# Patient Record
Sex: Female | Born: 1985 | Race: Black or African American | Hispanic: No | Marital: Married | Smoking: Never smoker
Health system: Southern US, Community
[De-identification: ages and names within clinical notes are randomized; demographics above are authoritative.]

## PROBLEM LIST (undated history)

## (undated) ENCOUNTER — Inpatient Hospital Stay (HOSPITAL_COMMUNITY): Payer: Self-pay

## (undated) DIAGNOSIS — E119 Type 2 diabetes mellitus without complications: Secondary | ICD-10-CM

## (undated) DIAGNOSIS — I10 Essential (primary) hypertension: Secondary | ICD-10-CM

## (undated) DIAGNOSIS — E669 Obesity, unspecified: Secondary | ICD-10-CM

## (undated) DIAGNOSIS — Z8619 Personal history of other infectious and parasitic diseases: Secondary | ICD-10-CM

## (undated) DIAGNOSIS — R51 Headache: Secondary | ICD-10-CM

## (undated) DIAGNOSIS — I639 Cerebral infarction, unspecified: Secondary | ICD-10-CM

## (undated) DIAGNOSIS — G459 Transient cerebral ischemic attack, unspecified: Secondary | ICD-10-CM

## (undated) DIAGNOSIS — R011 Cardiac murmur, unspecified: Secondary | ICD-10-CM

## (undated) DIAGNOSIS — R7303 Prediabetes: Secondary | ICD-10-CM

## (undated) DIAGNOSIS — R519 Headache, unspecified: Secondary | ICD-10-CM

## (undated) DIAGNOSIS — I38 Endocarditis, valve unspecified: Secondary | ICD-10-CM

## (undated) HISTORY — DX: Prediabetes: R73.03

## (undated) HISTORY — DX: Type 2 diabetes mellitus without complications: E11.9

## (undated) HISTORY — DX: Cerebral infarction, unspecified: I63.9

## (undated) HISTORY — DX: Obesity, unspecified: E66.9

## (undated) HISTORY — DX: Personal history of other infectious and parasitic diseases: Z86.19

## (undated) HISTORY — DX: Transient cerebral ischemic attack, unspecified: G45.9

---
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
## 2003-08-18 ENCOUNTER — Emergency Department (HOSPITAL_COMMUNITY): Admission: AD | Admit: 2003-08-18 | Discharge: 2003-08-18 | Payer: Self-pay | Admitting: Family Medicine

## 2007-06-05 ENCOUNTER — Other Ambulatory Visit: Admission: RE | Admit: 2007-06-05 | Discharge: 2007-06-05 | Payer: Self-pay | Admitting: Gynecology

## 2007-06-10 ENCOUNTER — Emergency Department (HOSPITAL_COMMUNITY): Admission: EM | Admit: 2007-06-10 | Discharge: 2007-06-10 | Payer: Self-pay | Admitting: Emergency Medicine

## 2008-02-10 ENCOUNTER — Emergency Department (HOSPITAL_COMMUNITY): Admission: EM | Admit: 2008-02-10 | Discharge: 2008-02-10 | Payer: Self-pay | Admitting: Emergency Medicine

## 2009-01-20 ENCOUNTER — Emergency Department (HOSPITAL_COMMUNITY): Admission: EM | Admit: 2009-01-20 | Discharge: 2009-01-20 | Payer: Self-pay | Admitting: Family Medicine

## 2009-02-07 ENCOUNTER — Emergency Department (HOSPITAL_COMMUNITY): Admission: EM | Admit: 2009-02-07 | Discharge: 2009-02-08 | Payer: Self-pay | Admitting: Emergency Medicine

## 2009-02-12 ENCOUNTER — Ambulatory Visit: Payer: Self-pay | Admitting: Family Medicine

## 2009-02-12 DIAGNOSIS — R030 Elevated blood-pressure reading, without diagnosis of hypertension: Secondary | ICD-10-CM | POA: Insufficient documentation

## 2009-02-22 ENCOUNTER — Emergency Department (HOSPITAL_COMMUNITY): Admission: EM | Admit: 2009-02-22 | Discharge: 2009-02-22 | Payer: Self-pay | Admitting: Emergency Medicine

## 2009-02-23 LAB — CONVERTED CEMR LAB
ALT: 19 units/L (ref 0–35)
AST: 18 units/L (ref 0–37)
Albumin: 3.5 g/dL (ref 3.5–5.2)
Alkaline Phosphatase: 62 units/L (ref 39–117)
BUN: 7 mg/dL (ref 6–23)
Basophils Absolute: 0.1 10*3/uL (ref 0.0–0.1)
Basophils Relative: 1 % (ref 0.0–3.0)
Bilirubin, Direct: 0 mg/dL (ref 0.0–0.3)
CO2: 27 meq/L (ref 19–32)
Calcium: 8.8 mg/dL (ref 8.4–10.5)
Chloride: 108 meq/L (ref 96–112)
Cholesterol: 161 mg/dL (ref 0–200)
Creatinine, Ser: 0.8 mg/dL (ref 0.4–1.2)
Eosinophils Absolute: 0.1 10*3/uL (ref 0.0–0.7)
Eosinophils Relative: 1.4 % (ref 0.0–5.0)
GFR calc non Af Amer: 112.35 mL/min (ref >60)
Glucose, Bld: 85 mg/dL (ref 70–99)
HCT: 34.8 % — ABNORMAL LOW (ref 36.0–46.0)
HDL: 42.8 mg/dL (ref >39.00)
Hemoglobin: 11.5 g/dL — ABNORMAL LOW (ref 12.0–15.0)
LDL Cholesterol: 104 mg/dL — ABNORMAL HIGH (ref 0–99)
Lymphocytes Relative: 34 % (ref 12.0–46.0)
Lymphs Abs: 2.2 10*3/uL (ref 0.7–4.0)
MCHC: 33.1 g/dL (ref 30.0–36.0)
MCV: 78.8 fL (ref 78.0–100.0)
Monocytes Absolute: 0.6 10*3/uL (ref 0.1–1.0)
Monocytes Relative: 8.9 % (ref 3.0–12.0)
Neutro Abs: 3.4 10*3/uL (ref 1.4–7.7)
Neutrophils Relative %: 54.7 % (ref 43.0–77.0)
Platelets: 339 10*3/uL (ref 150.0–400.0)
Potassium: 4 meq/L (ref 3.5–5.1)
RBC: 4.41 M/uL (ref 3.87–5.11)
RDW: 14.4 % (ref 11.5–14.6)
Sodium: 138 meq/L (ref 135–145)
TSH: 2.95 microintl units/mL (ref 0.35–5.50)
Total Bilirubin: 0.7 mg/dL (ref 0.3–1.2)
Total CHOL/HDL Ratio: 4
Total Protein: 8 g/dL (ref 6.0–8.3)
Triglycerides: 72 mg/dL (ref 0.0–149.0)
VLDL: 14.4 mg/dL (ref 0.0–40.0)
WBC: 6.4 10*3/uL (ref 4.5–10.5)

## 2009-02-24 ENCOUNTER — Telehealth: Payer: Self-pay | Admitting: Family Medicine

## 2009-02-24 ENCOUNTER — Ambulatory Visit: Payer: Self-pay | Admitting: Family Medicine

## 2009-02-24 ENCOUNTER — Encounter (INDEPENDENT_AMBULATORY_CARE_PROVIDER_SITE_OTHER): Payer: Self-pay | Admitting: *Deleted

## 2009-02-24 DIAGNOSIS — R0602 Shortness of breath: Secondary | ICD-10-CM | POA: Insufficient documentation

## 2009-02-24 DIAGNOSIS — I10 Essential (primary) hypertension: Secondary | ICD-10-CM | POA: Insufficient documentation

## 2009-02-24 DIAGNOSIS — R079 Chest pain, unspecified: Secondary | ICD-10-CM | POA: Insufficient documentation

## 2009-02-24 LAB — CONVERTED CEMR LAB
BUN: 8 mg/dL (ref 6–23)
CO2: 31 meq/L (ref 19–32)
Calcium: 8.9 mg/dL (ref 8.4–10.5)
Chloride: 108 meq/L (ref 96–112)
Creatinine, Ser: 0.6 mg/dL (ref 0.4–1.2)
GFR calc non Af Amer: 156.54 mL/min (ref >60)
Glucose, Bld: 91 mg/dL (ref 70–99)
Potassium: 3.6 meq/L (ref 3.5–5.1)
Sodium: 140 meq/L (ref 135–145)

## 2009-02-25 ENCOUNTER — Telehealth (INDEPENDENT_AMBULATORY_CARE_PROVIDER_SITE_OTHER): Payer: Self-pay | Admitting: *Deleted

## 2009-03-31 ENCOUNTER — Encounter (HOSPITAL_COMMUNITY): Admission: RE | Admit: 2009-03-31 | Discharge: 2009-05-27 | Payer: Self-pay | Admitting: Cardiology

## 2009-04-08 ENCOUNTER — Inpatient Hospital Stay (HOSPITAL_BASED_OUTPATIENT_CLINIC_OR_DEPARTMENT_OTHER): Admission: RE | Admit: 2009-04-08 | Discharge: 2009-04-08 | Payer: Self-pay | Admitting: Cardiology

## 2009-04-22 ENCOUNTER — Encounter: Payer: Self-pay | Admitting: Family Medicine

## 2009-11-21 ENCOUNTER — Emergency Department (HOSPITAL_COMMUNITY): Admission: EM | Admit: 2009-11-21 | Discharge: 2009-11-21 | Payer: Self-pay | Admitting: Emergency Medicine

## 2011-01-03 LAB — POCT I-STAT, CHEM 8
BUN: 5 mg/dL — ABNORMAL LOW (ref 6–23)
BUN: 8 mg/dL (ref 6–23)
Calcium, Ion: 1.16 mmol/L (ref 1.12–1.32)
Calcium, Ion: 1.13 mmol/L (ref 1.12–1.32)
Chloride: 106 mEq/L (ref 96–112)
Chloride: 99 mEq/L (ref 96–112)
Creatinine, Ser: 0.9 mg/dL (ref 0.4–1.2)
Creatinine, Ser: 1 mg/dL (ref 0.4–1.2)
Glucose, Bld: 91 mg/dL (ref 70–99)
Glucose, Bld: 103 mg/dL — ABNORMAL HIGH (ref 70–99)
HCT: 36 % (ref 36.0–46.0)
HCT: 35 % — ABNORMAL LOW (ref 36.0–46.0)
Hemoglobin: 12.2 g/dL (ref 12.0–15.0)
Hemoglobin: 11.9 g/dL — ABNORMAL LOW (ref 12.0–15.0)
Potassium: 3.4 mEq/L — ABNORMAL LOW (ref 3.5–5.1)
Potassium: 3 mEq/L — ABNORMAL LOW (ref 3.5–5.1)
Sodium: 140 mEq/L (ref 135–145)
Sodium: 137 mEq/L (ref 135–145)
TCO2: 23 mmol/L (ref 0–100)
TCO2: 29 mmol/L (ref 0–100)

## 2011-01-03 LAB — URINALYSIS, ROUTINE W REFLEX MICROSCOPIC
Bilirubin Urine: NEGATIVE
Glucose, UA: NEGATIVE mg/dL
Glucose, UA: NEGATIVE mg/dL
Hgb urine dipstick: NEGATIVE
Hgb urine dipstick: NEGATIVE
Ketones, ur: 15 mg/dL — AB
Ketones, ur: NEGATIVE mg/dL
Nitrite: NEGATIVE
Nitrite: NEGATIVE
Protein, ur: NEGATIVE mg/dL
Protein, ur: NEGATIVE mg/dL
Specific Gravity, Urine: 1.037 — ABNORMAL HIGH (ref 1.005–1.030)
Specific Gravity, Urine: 1.021 (ref 1.005–1.030)
Urobilinogen, UA: 1 mg/dL (ref 0.0–1.0)
Urobilinogen, UA: 0.2 mg/dL (ref 0.0–1.0)
pH: 6 (ref 5.0–8.0)
pH: 6.5 (ref 5.0–8.0)

## 2011-01-03 LAB — URINE MICROSCOPIC-ADD ON

## 2011-01-03 LAB — RAPID URINE DRUG SCREEN, HOSP PERFORMED
Amphetamines: NOT DETECTED
Barbiturates: NOT DETECTED
Benzodiazepines: NOT DETECTED
Cocaine: NOT DETECTED
Opiates: NOT DETECTED
Tetrahydrocannabinol: NOT DETECTED

## 2011-01-03 LAB — D-DIMER, QUANTITATIVE: D-Dimer, Quant: <0.22 ug/mL-FEU (ref 0.00–0.48)

## 2011-02-07 NOTE — Assessment & Plan Note (Signed)
[facility]                                 ON-CALL NOTE   Autumn [name], Autumn [name]                       MRN:          [id number]  DATE:02/22/2009                            DOB:          02-Mar-1986    PRIMARY CARE Nisa Decaire:  Dr. Loreen Freud.   I received a page from the patient and spoke to her sister.  The patient  is actually in the [facility] Emergency Room right now.  She was  currently getting a chest x-ray and being worked up for chest pain and  acute shortness of breath.  They had wanted to call and let her primary  care doctor know and also to see if I could be assistance in the  emergency room.  I discussed that typically acute cases in the emergency  room are evaluated by the emergency room physicians.  I encouraged them  to emphasize their worry to the ER attending.  Also, of note, I do not  have active privileges with the hospital system, so I would be unable to  evaluate the patient in the emergency room regardless.  Sister seemed to  understand this and had no further questions.     Juleen China, MD    STC/MedQ  DD: 02/22/2009  DT: 02/22/2009  Job #: [id number]

## 2011-02-07 NOTE — Cardiovascular Report (Signed)
NAME:  ARYELLE, FIGG              ACCOUNT NO.:  [id number]   MEDICAL RECORD NO.:  [id number]          PATIENT TYPE:  OIB   LOCATION:  1961                         FACILITY:  [facility]   PHYSICIAN:  Mohan N. Sharyn Lull, M.D. DATE OF BIRTH:  Apr 15, 1986   DATE OF PROCEDURE:  04/08/2009  DATE OF DISCHARGE:  04/08/2009                            CARDIAC CATHETERIZATION   PROCEDURES:  Left cardiac catheterization with selective left and right  coronary angiography, left ventriculography via right groin using  Judkins technique.   INDICATIONS FOR PROCEDURE:  Ms. Arnell is a 25 year old black female  with past medical history significant for hypertension, morbid obesity,  positive family history of coronary artery disease, degenerative joint  disease, chronic headache, complains of retrosternal chest pain,  described as tightness and sharp, occasionally radiating to the left arm  associated with numbness in the left arm and palpitation and dizziness  lasting few minutes.  The patient denies any syncopal episode, also  complains of exertional dyspnea.  Denies PND, orthopnea, or leg  swelling.  The patient underwent adenosine Myoview on July 8, which  showed large area of anterolateral wall ischemia with EF of 60%.  Denies  any relation of chest pain to food, breathing, or movement.  Denies any  history of neck trauma or motor vehicle accident.   PAST MEDICAL HISTORY:  As above.   PAST SURGICAL HISTORY:  None.   ALLERGIES:  None.   MEDICATIONS AT HOME:  She is on:  1. Toprol-XL 25 mg p.o. daily.  2. Enteric-coated aspirin 81 mg p.o. daily.  3. Nitrostat 0.4 mg sublingual daily.  4. Diovan HCT 320/12.5 daily.   SOCIAL HISTORY:  She is single.  No children.  No history of smoking or  alcohol abuse.  Drinks wine occasionally.  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   FAMILY HISTORY:  Father is alive.  He has coronary artery disease.  Mother is diabetic and hypertensive.  One aunt  had MI at the age of 36.  She has 4 sisters in good health.   PHYSICAL EXAMINATION:  GENERAL:  She is alert, awake, and oriented x3,  in no acute distress.  VITAL SIGNS:  Blood pressure was 150/84, pulse was 84 and regular.  HEENT:  Conjunctivae pink.  NECK:  Supple.  No JVD.  No bruit.  LUNGS:  Clear to auscultation without rhonchi or rales.  CARDIOVASCULAR:  S1 and S2 normal.  There was soft systolic murmur.  ABDOMEN:  Soft.  Bowel sounds present.  Obese and nontender.  EXTREMITIES:  There was no clubbing, cyanosis, or edema.   IMPRESSION:  1. Recurrent chest pain.  2. Left arm numbness.  3. Positive adenosine Myoview, rule out coronary insufficiency.  4. Hypertension.  5. Morbid obesity.  6. Positive family history of coronary artery disease.  7. History of chronic headaches.   I discussed with the patient at length regarding adenosine Myoview  result and left cath, its risks and benefits, i.e., death, MI, stroke,  need for emergency CABG, local vascular complications, etc., and  consented for the procedure.   PROCEDURE:  After obtaining informed consent, the patient was brought to  the cath lab and was placed on fluoroscopy table.  Right groin was  prepped and draped in usual fashion.  Xylocaine 1% was used for local  anesthesia.  With the help of thin-wall needle, 4-French arterial sheath  was placed.  The sheath was aspirated and flushed.  Next, a 4-French  left Judkins catheter was advanced over the wire under fluoroscopic  guidance up to the ascending aorta.  Wire was pulled out.  The catheter  was aspirated and connected to the manifold.  Catheter was further  advanced and engaged into the left coronary ostium.  Multiple views of  the left system were taken.  Next, the catheter was disengaged and was  pulled out over the wire and was replaced with 4-French 3D right  diagnostic catheter, which was advanced over the wire under fluoroscopic  guidance up to the ascending  aorta.  Wire was pulled out.  The catheter  was aspirated and connected to the manifold.  Catheter was further  advanced and engaged into the right coronary ostium.  Multiple views of  the right system were taken.  Next, catheter was disengaged and was  pulled out over the wire and was replaced with 4-French pigtail  catheter, which was advanced over the wire under fluoroscopic guidance  into the ascending aorta.  Wire was pulled out.  The catheter was  aspirated and connected to the manifold.  Catheter was further advanced  across the aortic valve into the LV.  LV pressures were recorded.  Next,  LV graphy was done in 30-degree RAO position.  Postangiographic  pressures were recorded from LV and then pullback pressures were  recorded from the aorta.  There was no significant gradient across the  aortic valve.  Next, a pigtail catheter was pulled out over the wire.  Sheaths were aspirated and flushed.   FINDINGS:  LV showed good LV systolic function, EF of approximately 60%.  Left main was patent.  LAD was patent.  Diagonal one was small, which  was patent.  Diagonal 2 was very small, which was patent.  The left  circumflex has 10% proximal stenosis and it tapers down in AV groove  after large OM2.  OM1 was very very small.  OM2 was large, which was  patent.  RCA has 10-15% mid stenosis.  PDA and PLV branches were very  small, which were patent.  The patient tolerated the procedure well.  There were no complications.  The patient was transferred to recovery  room in stable condition.      Eduardo Osier. Sharyn Lull, M.D.  Electronically Signed     MNH/MEDQ  D:  04/08/2009  T:  04/08/2009  Job:  [id number]   cc:   Cath Lab

## 2011-09-06 ENCOUNTER — Encounter: Payer: Self-pay | Admitting: *Deleted

## 2011-09-06 ENCOUNTER — Emergency Department (HOSPITAL_COMMUNITY)
Admission: EM | Admit: 2011-09-06 | Discharge: 2011-09-06 | Disposition: A | Discharge status: Home / Self Care | Payer: Self-pay | Attending: Emergency Medicine | Admitting: Emergency Medicine

## 2011-09-06 ENCOUNTER — Emergency Department (HOSPITAL_COMMUNITY): Payer: Self-pay

## 2011-09-06 ENCOUNTER — Other Ambulatory Visit: Payer: Self-pay

## 2011-09-06 DIAGNOSIS — IMO0001 Reserved for inherently not codable concepts without codable children: Secondary | ICD-10-CM | POA: Insufficient documentation

## 2011-09-06 DIAGNOSIS — R509 Fever, unspecified: Secondary | ICD-10-CM | POA: Insufficient documentation

## 2011-09-06 DIAGNOSIS — R05 Cough: Secondary | ICD-10-CM | POA: Insufficient documentation

## 2011-09-06 DIAGNOSIS — R059 Cough, unspecified: Secondary | ICD-10-CM | POA: Insufficient documentation

## 2011-09-06 DIAGNOSIS — R079 Chest pain, unspecified: Secondary | ICD-10-CM | POA: Insufficient documentation

## 2011-09-06 DIAGNOSIS — J3489 Other specified disorders of nose and nasal sinuses: Secondary | ICD-10-CM | POA: Insufficient documentation

## 2011-09-06 DIAGNOSIS — J111 Influenza due to unidentified influenza virus with other respiratory manifestations: Secondary | ICD-10-CM | POA: Insufficient documentation

## 2011-09-06 HISTORY — DX: Endocarditis, valve unspecified: I38

## 2011-09-06 MED ORDER — BENZONATATE 200 MG PO CAPS: 200.0000 mg | ORAL_CAPSULE | Freq: Three times a day (TID) | ORAL | Status: AC | PRN

## 2011-09-06 MED ORDER — BENZONATATE 100 MG PO CAPS
200.0000 mg | ORAL_CAPSULE | Freq: Once | ORAL | Status: AC
  Administered 2011-09-06: 200 mg via ORAL
  Filled 2011-09-06 (×2): qty 1

## 2011-09-06 NOTE — ED Notes (Signed)
Pt reports 4 day hx of midsternal chest pain. Reports hx of leaking valve. Pt reports cough developed 3 days ago with chills. Denies n/v.

## 2011-09-06 NOTE — ED Provider Notes (Signed)
History     CSN: [id number] Arrival date & time: 09/06/2011  5:16 PM   First MD Initiated Contact with Patient 09/06/11 1833      Chief Complaint  Patient presents with  . Cough  . Chills  . Chest Pain    (Consider location/radiation/quality/duration/timing/severity/associated sxs/prior treatment) Patient is a 25 y.o. female presenting with cough and chest pain. The history is provided by the patient.  Cough This is a new problem. The current episode started more than 2 days ago. The problem occurs every few minutes. The cough is non-productive. The maximum temperature recorded prior to her arrival was 100 to 100.9 F. The fever has been present for 1 to 2 days. Associated symptoms include chest pain, rhinorrhea, sore throat and myalgias. Pertinent negatives include no headaches, no shortness of breath and no wheezing. Associated symptoms comments: Chest pain pain feels sore from coughing.. Treatments tried: tylenol. The treatment provided mild relief. She is not a smoker.  Chest Pain Primary symptoms include a fever and cough. Pertinent negatives for primary symptoms include no shortness of breath, no wheezing, no abdominal pain, no nausea and no dizziness.  Pertinent negatives for associated symptoms include no numbness and no weakness.     Past Medical History  Diagnosis Date  . Leaky heart valve     History reviewed. No pertinent past surgical history.  History reviewed. No pertinent family history.  History  Substance Use Topics  . Smoking status: Never Smoker   . Smokeless tobacco: Not on file  . Alcohol Use: Yes    OB History    Grav Para Term Preterm Abortions TAB SAB Ect Mult Living                  Review of Systems  Constitutional: Positive for fever.  HENT: Positive for sore throat and rhinorrhea. Negative for congestion and neck pain.   Eyes: Negative.   Respiratory: Positive for cough. Negative for chest tightness, shortness of breath and wheezing.     Cardiovascular: Positive for chest pain.  Gastrointestinal: Negative for nausea and abdominal pain.  Genitourinary: Negative.   Musculoskeletal: Positive for myalgias. Negative for joint swelling and arthralgias.  Skin: Negative.  Negative for rash and wound.  Neurological: Negative for dizziness, weakness, light-headedness, numbness and headaches.  Hematological: Negative.   Psychiatric/Behavioral: Negative.     Allergies  Review of patient's allergies indicates no known allergies.  Home Medications   Current Outpatient Rx  Name Route Sig Dispense Refill  . TYLENOL PO Oral Take 1 tablet by mouth every 6 (six) hours as needed. For pain.     Marland Kitchen BENZONATATE 200 MG PO CAPS Oral Take 1 capsule (200 mg total) by mouth 3 (three) times daily as needed for cough. 20 capsule 0    BP 139/86  Pulse 80  Temp(Src) 99.6 F (37.6 C) (Oral)  Resp 22  SpO2 97%  Physical Exam  Nursing note and vitals reviewed. Constitutional: She is oriented to person, place, and time. She appears well-developed and well-nourished.  HENT:  Head: Normocephalic and atraumatic.  Eyes: Conjunctivae are normal.  Neck: Normal range of motion.  Cardiovascular: Normal rate, regular rhythm, normal heart sounds and intact distal pulses.   Pulmonary/Chest: Effort normal and breath sounds normal. No respiratory distress. She has no wheezes. She has no rales. She exhibits tenderness.  Abdominal: Soft. Bowel sounds are normal. There is no tenderness.  Musculoskeletal: Normal range of motion.  Neurological: She is alert and oriented to person,  place, and time.  Skin: Skin is warm and dry.  Psychiatric: She has a normal mood and affect.    ED Course  Procedures (including critical care time)  Labs Reviewed - No data to display Dg Chest 2 View  09/06/2011  *RADIOLOGY REPORT*  Clinical Data: Chest pain.  Congestion and cough.  CHEST - 2 VIEW  Comparison: 02/22/2009  Findings: An there is some left ventricular  prominence. Mediastinal shadows are normal.  The vascularity is normal.  Lungs are clear.  No effusions.  No bony abnormalities.  There may be mild bronchial thickening.  IMPRESSION: Mild bronchitis is possible.  No consolidation or collapse.  Original Report Authenticated By: Thomasenia Sales, M.D.     1. Influenza       MDM  Tessalon,  Fluids,  Rest,  Tylenol or motrin.        Candis Musa, PA 09/06/11 2234

## 2011-09-06 NOTE — ED Provider Notes (Signed)
Medical screening examination/treatment/procedure(s) were performed by non-physician practitioner and as supervising physician I was immediately available for consultation/collaboration.   Dominque Marlin, MD 09/06/11 2318 

## 2011-10-19 ENCOUNTER — Encounter: Payer: Self-pay | Admitting: Gynecology

## 2011-10-19 ENCOUNTER — Ambulatory Visit (INDEPENDENT_AMBULATORY_CARE_PROVIDER_SITE_OTHER): Payer: 59

## 2011-10-19 ENCOUNTER — Ambulatory Visit (INDEPENDENT_AMBULATORY_CARE_PROVIDER_SITE_OTHER): Payer: 59 | Admitting: Gynecology

## 2011-10-19 VITALS — BP 132/90 | Ht 64.5 in | Wt 315.0 lb

## 2011-10-19 DIAGNOSIS — N912 Amenorrhea, unspecified: Secondary | ICD-10-CM

## 2011-10-19 DIAGNOSIS — O34599 Maternal care for other abnormalities of gravid uterus, unspecified trimester: Secondary | ICD-10-CM

## 2011-10-19 DIAGNOSIS — O3480 Maternal care for other abnormalities of pelvic organs, unspecified trimester: Secondary | ICD-10-CM

## 2011-10-19 LAB — US OB TRANSVAGINAL

## 2011-10-19 LAB — PREGNANCY, URINE: Preg Test, Ur: POSITIVE

## 2011-10-19 NOTE — Progress Notes (Signed)
The patient is a 26 year old gravida 0 who has not been seen the office since 2008. Patient stated that she had at home pregnancy test in December which was positive. She stated her last menstrual period was November 18. Urine pregnancy test in the office today with positive. Estimated gestational age based on last menstrual period is 9/2 weeks. Estimated date of confinement 05/17/2012. Patient has mild nausea but no vomiting reported. She states she's had normal menstrual cycles.  Pelvic exam: Somewhat limited due to patient's BMI of 53.23  Ultrasound was ordered which demonstrated a viable intrauterine pregnancy size consistent with dates. Fetal pole and cardiac activity was noted along with a normal yolk sac. A low-lying posterior placenta was noted. A right ovarian corpus luteum cyst was noted. Left R. was normal. Cervix long closed.  Patient was started on prenatal vitamins and was referred to my obstetrical colleagues for her obstetrical care. Literature information on pregnancies of drusen dose was provided as well.

## 2011-10-19 NOTE — Patient Instructions (Signed)
Pregnancy If you are planning on getting pregnant, it is a good idea to make a preconception appointment with your care- giver to discuss having a healthy lifestyle before getting pregnant. Such as, diet, weight, exercise, taking prenatal vitamins especially folic acid (it helps prevent brain and spinal cord defects), avoiding alcohol, smoking and illegal drugs, medical problems (diabetes, convulsions), family history of genetic problems, working conditions and immunizations. It is better to have knowledge of these things and do something about them before getting pregnant. In your pregnancy, it is important to follow certain guidelines to have a healthy baby. It is very important to get good prenatal care and follow your caregiver's instructions. Prenatal care includes all the medical care you receive before your baby's birth. This helps to prevent problems during the pregnancy and childbirth. HOME CARE INSTRUCTIONS   Start your prenatal visits by the 12th week of pregnancy or before when possible. They are usually scheduled monthly at first. They are more often in the last 2 months before delivery. It is important that you keep your caregiver's appointments and follow your caregiver's instructions regarding medication use, exercise, and diet.   During pregnancy, you are providing food for you and your baby. Eat a regular, well-balanced diet. Choose foods such as meat, fish, milk and other dairy products, vegetables, fruits, whole-grain breads and cereals. Your caregiver will inform you of the ideal weight gain depending on your current height and weight. Drink lots of liquids. Try to drink 8 glasses of water a day.   Alcohol is associated with a number of birth defects including fetal alcohol syndrome. It is best to avoid alcohol completely. Smoking will cause low birth rate and prematurity. Use of alcohol and nicotine during your pregnancy also increases the chances that your child will be chemically  dependent later in their life and may contribute to SIDS (Sudden Infant Death Syndrome).   Do not use illegal drugs.   Only take prescription or over-the-counter medications that are recommended by your caregiver. Other medications can cause genetic and physical problems in the baby.   Morning sickness can often be helped by keeping soda crackers at the bedside. Eat a couple before arising in the morning.   A sexual relationship may be continued until near the end of pregnancy if there are no other problems such as early (premature) leaking of amniotic fluid from the membranes, vaginal bleeding, painful intercourse or belly (abdominal) pain.   Exercise regularly. Check with your caregiver if you are unsure of the safety of some of your exercises.   Do not use hot tubs, steam rooms or saunas. These increase the risk of fainting or passing out and hurting yourself and the baby. Swimming is OK for exercise. Get plenty of rest, including afternoon naps when possible especially in the third trimester.   Avoid toxic odors and chemicals.   Do not wear high heels. They may cause you to lose your balance and fall.   Do not lift over 5 pounds. If you do lift anything, lift with your legs and thighs, not your back.   Avoid long trips, especially in the third trimester.   If you have to travel out of the city or state, take a copy of your medical records with you.  SEEK IMMEDIATE MEDICAL CARE IF:   You develop an unexplained oral temperature above 102 F (38.9 C), or as your caregiver suggests.   You have leaking of fluid from the vagina. If leaking membranes are suspected, take   your temperature and inform your caregiver of this when you call.   There is vaginal spotting or bleeding. Notify your caregiver of the amount and how many pads are used.   You continue to feel sick to your stomach (nauseous) and have no relief from remedies suggested, or you throw up (vomit) blood or coffee ground like  materials.   You develop upper abdominal pain.   You have round ligament discomfort in the lower abdominal area. This still must be evaluated by your caregiver.   You feel contractions of the uterus.   You do not feel the baby move, or there is less movement than before.   You have painful urination.   You have abnormal vaginal discharge.   You have persistent diarrhea.   You get a severe headache.   You have problems with your vision.   You develop muscle weakness.   You feel dizzy and faint.   You develop shortness of breath.   You develop chest pain.   You have back pain that travels down to your leg and feet.   You feel irregular or a very fast heartbeat.   You develop excessive weight gain in a short period of time (5 pounds in 3 to 5 days).   You are involved with a domestic violence situation.  Document Released: 09/11/2005 Document Revised: 05/24/2011 Document Reviewed: 03/05/2009 [employer] Patient Information 2012 [employer]. 

## 2011-11-10 ENCOUNTER — Encounter (HOSPITAL_COMMUNITY): Payer: Self-pay | Admitting: Emergency Medicine

## 2011-11-10 ENCOUNTER — Emergency Department (HOSPITAL_COMMUNITY)
Admission: EM | Admit: 2011-11-10 | Discharge: 2011-11-10 | Disposition: A | Discharge status: Home / Self Care | Payer: 59 | Attending: Emergency Medicine | Admitting: Emergency Medicine

## 2011-11-10 ENCOUNTER — Emergency Department (HOSPITAL_COMMUNITY): Payer: 59

## 2011-11-10 ENCOUNTER — Other Ambulatory Visit: Payer: Self-pay

## 2011-11-10 DIAGNOSIS — R079 Chest pain, unspecified: Secondary | ICD-10-CM | POA: Insufficient documentation

## 2011-11-10 DIAGNOSIS — R002 Palpitations: Secondary | ICD-10-CM | POA: Insufficient documentation

## 2011-11-10 LAB — CBC
HCT: 34.5 % — ABNORMAL LOW (ref 36.0–46.0)
Hemoglobin: 11.9 g/dL — ABNORMAL LOW (ref 12.0–15.0)
MCH: 26.7 pg (ref 26.0–34.0)
MCHC: 34.5 g/dL (ref 30.0–36.0)
MCV: 77.4 fL — ABNORMAL LOW (ref 78.0–100.0)
Platelets: 275 10*3/uL (ref 150–400)
RBC: 4.46 MIL/uL (ref 3.87–5.11)
RDW: 14.6 % (ref 11.5–15.5)
WBC: 8 10*3/uL (ref 4.0–10.5)

## 2011-11-10 LAB — DIFFERENTIAL
Basophils Absolute: 0 10*3/uL (ref 0.0–0.1)
Basophils Relative: 0 % (ref 0–1)
Eosinophils Absolute: 0.1 10*3/uL (ref 0.0–0.7)
Eosinophils Relative: 1 % (ref 0–5)
Lymphocytes Relative: 33 % (ref 12–46)
Lymphs Abs: 2.6 10*3/uL (ref 0.7–4.0)
Monocytes Absolute: 0.7 10*3/uL (ref 0.1–1.0)
Monocytes Relative: 9 % (ref 3–12)
Neutro Abs: 4.5 10*3/uL (ref 1.7–7.7)
Neutrophils Relative %: 57 % (ref 43–77)

## 2011-11-10 LAB — BASIC METABOLIC PANEL
BUN: 6 mg/dL (ref 6–23)
GFR calc non Af Amer: >90 mL/min (ref >90)
Glucose, Bld: 81 mg/dL (ref 70–99)
Potassium: 3.6 mEq/L (ref 3.5–5.1)

## 2011-11-10 NOTE — ED Notes (Signed)
Chest pain and palpitations x 3  Days  States is 3 months preg. States has a leaking problem in heart valve 1 year ago

## 2011-11-10 NOTE — ED Notes (Signed)
Pt. reports that she has felt tiered and SOB for the past 2 days.  Pt. Reports that her energy level has gone down.  Pt. reports that her right arm has been numb and was "numb on the way here to the hospital."  Pt. Still has a c/o right center chest pain that it is "tight."

## 2011-11-10 NOTE — ED Notes (Signed)
Pt back from x-ray.

## 2011-11-10 NOTE — ED Notes (Signed)
Attempted to hear FHT with doppler but unable to. Pt states she is [redacted] weeks pregnant and her OB office tried last week and told her she was still too early in pregnancy to hear FHT.

## 2011-11-10 NOTE — ED Notes (Signed)
D/C inst reviewed. Pt verbalized understanding.

## 2011-11-10 NOTE — ED Notes (Signed)
Pt placed on monitor and pulse ox 

## 2011-11-10 NOTE — ED Provider Notes (Signed)
History     CSN: [id number]  Arrival date & time 11/10/11  1007   First MD Initiated Contact with Patient 11/10/11 1110      Chief Complaint  Patient presents with  . Chest Pain    (Consider location/radiation/quality/duration/timing/severity/associated sxs/prior treatment) Patient is a 26 y.o. female presenting with chest pain. The history is provided by the patient (The patient complains of palpitations. Mild chest discomfort.). No language interpreter was used.  Chest Pain The chest pain began 3 - 5 days ago. Chest pain occurs frequently. The chest pain is unchanged. The pain is associated with stress. At its most intense, the pain is at 3/10. The pain is currently at 1/10. The quality of the pain is described as aching. The pain does not radiate. Exacerbated by: nothing. Primary symptoms include palpitations. Pertinent negatives for primary symptoms include no fever, no fatigue, no cough and no abdominal pain.  Pertinent negatives for associated symptoms include no claudication. She tried nothing for the symptoms. Risk factors include obesity.  Pertinent negatives for past medical history include no aneurysm and no seizures.     Past Medical History  Diagnosis Date  . Leaky heart valve   . Pregnancy as incidental finding     History reviewed. No pertinent past surgical history.  Family History  Problem Relation Age of Onset  . Hypertension Mother   . Diabetes Sister   . Hypertension Sister     History  Substance Use Topics  . Smoking status: Never Smoker   . Smokeless tobacco: Never Used  . Alcohol Use: Yes     OCC    OB History    Grav Para Term Preterm Abortions TAB SAB Ect Mult Living   1               Review of Systems  Constitutional: Negative for fever and fatigue.  HENT: Negative for congestion, sinus pressure and ear discharge.   Eyes: Negative for discharge.  Respiratory: Negative for cough.   Cardiovascular: Positive for chest pain and  palpitations. Negative for claudication.  Gastrointestinal: Negative for abdominal pain and diarrhea.  Genitourinary: Negative for frequency and hematuria.  Musculoskeletal: Negative for back pain.  Skin: Negative for rash.  Neurological: Negative for seizures and headaches.  Hematological: Negative.   Psychiatric/Behavioral: Negative for hallucinations.    Allergies  Review of patient's allergies indicates no known allergies.  Home Medications   Current Outpatient Rx  Name Route Sig Dispense Refill  . ACETAMINOPHEN 500 MG PO TABS Oral Take 1,000 mg by mouth every 6 (six) hours as needed. For pain    . ASPIRIN EC 81 MG PO TBEC Oral Take 81 mg by mouth daily.    Marland Kitchen PRENATAL MULTIVITAMIN CH Oral Take 1 tablet by mouth daily.      BP 140/70  Pulse 98  Temp(Src) 98 F (36.7 C) (Oral)  Resp 14  SpO2 100%  LMP 08/13/2011  Physical Exam  Constitutional: She is oriented to person, place, and time. She appears well-developed.  HENT:  Head: Normocephalic and atraumatic.  Eyes: Conjunctivae and EOM are normal. No scleral icterus.  Neck: Neck supple. No thyromegaly present.  Cardiovascular: Normal rate and regular rhythm.  Exam reveals no gallop and no friction rub.   No murmur heard. Pulmonary/Chest: No stridor. She has no wheezes. She has no rales. She exhibits no tenderness.  Abdominal: She exhibits no distension. There is no tenderness. There is no rebound.  Musculoskeletal: Normal range of motion. She exhibits  no edema.  Lymphadenopathy:    She has no cervical adenopathy.  Neurological: She is oriented to person, place, and time. Coordination normal.  Skin: No rash noted. No erythema.  Psychiatric: She has a normal mood and affect. Her behavior is normal.    ED Course  Procedures (including critical care time)  Labs Reviewed  CBC - Abnormal; Notable for the following:    Hemoglobin 11.9 (*)    HCT 34.5 (*)    MCV 77.4 (*)    All other components within normal limits    BASIC METABOLIC PANEL - Abnormal; Notable for the following:    Sodium 133 (*)    All other components within normal limits  DIFFERENTIAL  POCT I-STAT TROPONIN I   Dg Chest 1 View  11/10/2011  *RADIOLOGY REPORT*  Clinical Data: Heart palpitations, cough, mid chest pain  CHEST - 1 VIEW  Comparison: Chest x-ray of 09/06/2011  Findings: The patient is pregnant and her abdomen and pelvis were lead shielded.  Therefore, only a single view was obtained.  The lungs are clear.  The heart is within upper limits of normal.  No bony abnormality is seen.  IMPRESSION: No active lung disease.  Original Report Authenticated By: Juline Patch, M.D.     1. Palpitations     Date: 11/10/2011  Rate: 92  Rhythm: normal sinus rhythm  QRS Axis: normal  Intervals: normal  ST/T Wave abnormalities: normal  Conduction Disutrbances:none  Narrative Interpretation:   Old EKG Reviewed: none available     MDM   Palpitaions,  Unknown cause.  None seen on monitor.  Pt to follow up with cardiologist and possible get holter       Benny Lennert, MD 11/10/11 1426

## 2011-11-16 ENCOUNTER — Other Ambulatory Visit: Payer: Self-pay | Admitting: *Deleted

## 2011-11-16 NOTE — Telephone Encounter (Signed)
Pt is 12 weeks preg. And wanted to know if Dr.Jf would still be able to her as a patient. She was given names of other OB doctors but 1 place [facility] told pt about said that she had to pay money up front for her care.I explained to pt that JF no longer does OB. Pt said she was concerned because she has a heart value problem and she hasn't been feeling good. I told pt that she could always go to [facility] hospital if any problems should occur. Pt has appointment at [facility] [place] [facility] on march 7th I told pt keep this appointment. Pt was okay with all the above.

## 2011-11-20 LAB — OB RESULTS CONSOLE ABO/RH: RH Type: POSITIVE

## 2011-11-20 LAB — OB RESULTS CONSOLE HIV ANTIBODY (ROUTINE TESTING): HIV: NONREACTIVE

## 2011-11-22 ENCOUNTER — Encounter (HOSPITAL_COMMUNITY): Payer: Self-pay

## 2011-11-22 ENCOUNTER — Inpatient Hospital Stay (HOSPITAL_COMMUNITY)
Admission: AD | Admit: 2011-11-22 | Discharge: 2011-11-22 | Disposition: A | Discharge status: Home / Self Care | Payer: 59 | Source: Ambulatory Visit | Attending: Obstetrics and Gynecology | Admitting: Obstetrics and Gynecology

## 2011-11-22 ENCOUNTER — Other Ambulatory Visit: Payer: Self-pay

## 2011-11-22 DIAGNOSIS — O2690 Pregnancy related conditions, unspecified, unspecified trimester: Secondary | ICD-10-CM

## 2011-11-22 DIAGNOSIS — R002 Palpitations: Secondary | ICD-10-CM

## 2011-11-22 DIAGNOSIS — O10919 Unspecified pre-existing hypertension complicating pregnancy, unspecified trimester: Secondary | ICD-10-CM

## 2011-11-22 DIAGNOSIS — R209 Unspecified disturbances of skin sensation: Secondary | ICD-10-CM

## 2011-11-22 DIAGNOSIS — R109 Unspecified abdominal pain: Secondary | ICD-10-CM | POA: Insufficient documentation

## 2011-11-22 DIAGNOSIS — Z331 Pregnant state, incidental: Secondary | ICD-10-CM

## 2011-11-22 DIAGNOSIS — R079 Chest pain, unspecified: Secondary | ICD-10-CM | POA: Insufficient documentation

## 2011-11-22 DIAGNOSIS — O99891 Other specified diseases and conditions complicating pregnancy: Secondary | ICD-10-CM | POA: Insufficient documentation

## 2011-11-22 DIAGNOSIS — R0789 Other chest pain: Secondary | ICD-10-CM

## 2011-11-22 DIAGNOSIS — O10019 Pre-existing essential hypertension complicating pregnancy, unspecified trimester: Secondary | ICD-10-CM | POA: Insufficient documentation

## 2011-11-22 HISTORY — DX: Essential (primary) hypertension: I10

## 2011-11-22 LAB — URINE MICROSCOPIC-ADD ON

## 2011-11-22 LAB — URINALYSIS, ROUTINE W REFLEX MICROSCOPIC
Glucose, UA: NEGATIVE mg/dL
Ketones, ur: NEGATIVE mg/dL
pH: 6 (ref 5.0–8.0)

## 2011-11-22 MED ORDER — LABETALOL HCL 100 MG PO TABS: 100.0000 mg | ORAL_TABLET | Freq: Two times a day (BID) | ORAL | Status: DC

## 2011-11-22 MED ORDER — LABETALOL HCL 100 MG PO TABS
100.0000 mg | ORAL_TABLET | Freq: Once | ORAL | Status: AC
  Administered 2011-11-22: 100 mg via ORAL
  Filled 2011-11-22: qty 1

## 2011-11-22 NOTE — ED Provider Notes (Signed)
Pt sleeping and currently states she feels better BP 145/81  Pulse 102  Temp(Src) 98.9 F (37.2 C) (Oral)  Resp 22  Ht 5\' 3"  (1.6 m)  Wt 143.7 kg (316 lb 12.8 oz)  BMI 56.12 kg/m2  SpO2 100%  LMP 08/13/2011 Gen in NAD CV RRR Lungs CTAB ABD soft NT  EXt no c/c/e Chest pain Now becoming better EKG with non specific T wave changes D/W Dr Marni Griffon he wants to draw cardiac enzymes if they are normal he will see her tomorrow at three

## 2011-11-22 NOTE — Progress Notes (Signed)
Asleep, awakens and denies palpitations or chest discomfort  Results for orders placed during the hospital encounter of 11/22/11 (from the past 72 hour(s))  URINALYSIS, ROUTINE W REFLEX MICROSCOPIC     Status: Abnormal   Collection Time   11/22/11  9:52 AM      Component Value Range Comment   Color, Urine YELLOW  YELLOW     APPearance CLOUDY (*) CLEAR     Specific Gravity, Urine >1.030 (*) 1.005 - 1.030     pH 6.0  5.0 - 8.0     Glucose, UA NEGATIVE  NEGATIVE (mg/dL)    Hgb urine dipstick TRACE (*) NEGATIVE     Bilirubin Urine NEGATIVE  NEGATIVE     Ketones, ur NEGATIVE  NEGATIVE (mg/dL)    Protein, ur NEGATIVE  NEGATIVE (mg/dL)    Urobilinogen, UA 0.2  0.0 - 1.0 (mg/dL)    Nitrite NEGATIVE  NEGATIVE     Leukocytes, UA TRACE (*) NEGATIVE    URINE MICROSCOPIC-ADD ON     Status: Abnormal   Collection Time   11/22/11  9:52 AM      Component Value Range Comment   Squamous Epithelial / LPF MANY (*) RARE     WBC, UA 0-2  <3 (WBC/hpf)    RBC / HPF 0-2  <3 (RBC/hpf)    Bacteria, UA RARE  RARE    CARDIAC PANEL(CRET KIN+CKTOT+MB+TROPI)     Status: Abnormal   Collection Time   11/22/11 12:45 PM      Component Value Range Comment   Total CK 231 (*) 7 - 177 (U/L)    CK, MB 2.2  0.3 - 4.0 (ng/mL)    Troponin I <0.30  <0.30 (ng/mL)    Relative Index 1.0  0.0 - 2.5    BP 143/69  Pulse 96  Temp(Src) 98.9 F (37.2 C) (Oral)  Resp 22  Ht 5\' 3"  (1.6 m)  Wt 143.7 kg (316 lb 12.8 oz)  BMI 56.12 kg/m2  SpO2 100%  LMP 08/13/2011 CHTN 14 3/7 week IUP Discussed labs with Dr. Normand Sloop at [facility] and then called results to Dr. Sharyn Lull with plan for discharge on Labetalol 100 mg BID, f/o with Dr. Sharyn Lull 2/28 at 1500 and with [facility] as scheduled. Teaching regarding HTN in pg and ABCs of pg. Lavera Guise, CNM

## 2011-11-22 NOTE — Progress Notes (Signed)
Pt states has lower abdominal cramping since this morning. Denies vaginal bleeding or discharge. States has tightness in the upper part of her chest & feels like her heart is fluttering. States has these kind of chest tightness episodes periodically since being diagnosed with leaky heart valve. States has high blood pressure but stopped taken blood pressure medicine when she found out she was pregnant.

## 2011-11-22 NOTE — Discharge Instructions (Signed)
Hypertension in Pregnancy Hypertension means high blood pressure. Blood pressure moves blood in your body. Sometimes, the force that moves the blood becomes too strong. When you are pregnant, this condition should be watched carefully. HOME CARE   Follow your caregiver's instructions carefully.   Only take medicine as told by your doctor.  GET HELP RIGHT AWAY IF:  You have belly (abdominal) pain.   You have sudden puffiness (swelling) in the hands, ankles, or face.   You gain 4 pounds (1.8 kilograms) or more in 1 week.   You throw up (vomit) repeatedly.   You have vaginal bleeding.   You do not feel the baby moving as much.   You have a headache.   You have blurred or double vision.   You have muscle twitching or spasms.   You have shortness of breath.   You have blue fingernails and lips.   You have blood in your pee (urine).  MAKE SURE YOU:  Understand these instructions.   Will watch your condition.   Will get help right away if you are not doing well.  Document Released: 10/14/2010 Document Revised: 12/27/2010 Document Reviewed: 07/30/2007 [facility] Patient Information 2012 [place], [place]. ABCs of Pregnancy A Antepartum care is very important. Be sure you see your doctor and get prenatal care as soon as you think you are pregnant. At this time, you will be tested for infection, genetic abnormalities and potential problems with you and the pregnancy. This is the time to discuss diet, exercise, work, medications, labor, pain medication during labor and the possibility of a cesarean delivery. Ask any questions that may concern you. It is important to see your doctor regularly throughout your pregnancy. Avoid exposure to toxic substances and chemicals - such as cleaning solvents, lead and mercury, some insecticides, and paint. Pregnant women should avoid exposure to paint fumes, and fumes that cause you to feel ill, dizzy or faint. When possible, it is a good idea to have a  pre-pregnancy consultation with your caregiver to begin some important recommendations your caregiver suggests such as, taking folic acid, exercising, quitting smoking, avoiding alcoholic beverages, etc. B Breastfeeding is the healthiest choice for both you and your baby. It has many nutritional benefits for the baby and health benefits for the mother. It also creates a very tight and loving bond between the baby and mother. Talk to your doctor, your family and friends, and your employer about how you choose to feed your baby and how they can support you in your decision. Not all birth defects can be prevented, but a woman can take actions that may increase her chance of having a healthy baby. Many birth defects happen very early in pregnancy, sometimes before a woman even knows she is pregnant. Birth defects or abnormalities of any child in your or the father's family should be discussed with your caregiver. Get a good support bra as your breast size changes. Wear it especially when you exercise and when nursing.  C Celebrate the news of your pregnancy with the your spouse/father and family. Childbirth classes are helpful to take for you and the spouse/father because it helps to understand what happens during the pregnancy, labor and delivery. Cesarean delivery should be discussed with your doctor so you are prepared for that possibility. The pros and cons of circumcision if it is a boy, should be discussed with your pediatrician. Cigarette smoking during pregnancy can result in low birth weight babies. It has been associated with infertility, miscarriages, tubal  pregnancies, infant death (mortality) and poor health (morbidity) in childhood. Additionally, cigarette smoking may cause long-term learning disabilities. If you smoke, you should try to quit before getting pregnant and not smoke during the pregnancy. Secondary smoke may also harm a mother and her developing baby. It is a good idea to ask people to  stop smoking around you during your pregnancy and after the baby is born. Extra calcium is necessary when you are pregnant and is found in your prenatal vitamin, in dairy products, green leafy vegetables and in calcium supplements. D A healthy diet according to your current weight and height, along with vitamins and mineral supplements should be discussed with your caregiver. Domestic abuse or violence should be made known to your doctor right away to get the situation corrected. Drink more water when you exercise to keep hydrated. Discomfort of your back and legs usually develops and progresses from the middle of the second trimester through to delivery of the baby. This is because of the enlarging baby and uterus, which may also affect your balance. Do not take illegal drugs. Illegal drugs can seriously harm the baby and you. Drink extra fluids (water is best) throughout pregnancy to help your body keep up with the increases in your blood volume. Drink at least 6 to 8 glasses of water, fruit juice, or milk each day. A good way to know you are drinking enough fluid is when your urine looks almost like clear water or is very light yellow.  E Eat healthy to get the nutrients you and your unborn baby need. Your meals should include the five basic food groups. Exercise (30 minutes of light to moderate exercise a day) is important and encouraged during pregnancy, if there are no medical problems or problems with the pregnancy. Exercise that causes discomfort or dizziness should be stopped and reported to your caregiver. Emotions during pregnancy can change from being ecstatic to depression and should be understood by you, your partner and your family. F Fetal screening with ultrasound, amniocentesis and monitoring during pregnancy and labor is common and sometimes necessary. Take 400 micrograms of folic acid daily both before, when possible, and during the first few months of pregnancy to reduce the risk of birth  defects of the brain and spine. All women who could possibly become pregnant should take a vitamin with folic acid, every day. It is also important to eat a healthy diet with fortified foods (enriched grain products, including cereals, rice, breads, and pastas) and foods with natural sources of folate (orange juice, green leafy vegetables, beans, peanuts, broccoli, asparagus, peas, and lentils). The father should be involved with all aspects of the pregnancy including, the prenatal care, childbirth classes, labor, delivery, and postpartum time. Fathers may also have emotional concerns about being a father, financial needs, and raising a family. G Genetic testing should be done appropriately. It is important to know your family and the father's history. If there have been problems with pregnancies or birth defects in your family, report these to your doctor. Also, genetic counselors can talk with you about the information you might need in making decisions about having a family. You can call a major medical center in your area for help in finding a board-certified genetic counselor. Genetic testing and counseling should be done before pregnancy when possible, especially if there is a history of problems in the mother's or father's family. Certain ethnic backgrounds are more at risk for genetic defects. H Get familiar with the hospital where  you will be having your baby. Get to know how long it takes to get there, the labor and delivery area, and the hospital procedures. Be sure your medical insurance is accepted there. Get your home ready for the baby including, clothes, the baby's room (when possible), furniture and car seat. Hand washing is important throughout the day, especially after handling raw meat and poultry, changing the baby's diaper or using the bathroom. This can help prevent the spread of many bacteria and viruses that cause infection. Your hair may become dry and thinner, but will return to normal  a few weeks after the baby is born. Heartburn is a common problem that can be treated by taking antacids recommended by your caregiver, eating smaller meals 5 or 6 times a day, not drinking liquids when eating, drinking between meals and raising the head of your bed 2 to 3 inches. I Insurance to cover you, the baby, doctor and hospital should be reviewed so that you will be prepared to pay any costs not covered by your insurance plan. If you do not have medical insurance, there are usually clinics and services available for you in your community. Take 30 milligrams of iron during your pregnancy as prescribed by your doctor to reduce the risk of low red blood cells (anemia) later in pregnancy. All women of childbearing age should eat a diet rich in iron. J There should be a joint effort for the mother, father and any other children to adapt to the pregnancy financially, emotionally, and psychologically during the pregnancy. Join a support group for moms-to-be. Or, join a class on parenting or childbirth. Have the family participate when possible. K Know your limits. Let your caregiver know if you experience any of the following:   Pain of any kind.   Strong cramps.   You develop a lot of weight in a short period of time (5 pounds in 3 to 5 days).   Vaginal bleeding, leaking of amniotic fluid.   Headache, vision problems.   Dizziness, fainting, shortness of breath.   Chest pain.   Fever of 102 F (38.9 C) or higher.   Gush of clear fluid from your vagina.   Painful urination.   Domestic violence.   Irregular heartbeat (palpitations).   Rapid beating of the heart (tachycardia).   Constant feeling sick to your stomach (nauseous) and vomiting.   Trouble walking, fluid retention (edema).   Muscle weakness.   If your baby has decreased activity.   Persistent diarrhea.   Abnormal vaginal discharge.   Uterine contractions at 20-minute intervals.   Back pain that travels down  your leg.  L Learn and practice that what you eat and drink should be in moderation and healthy for you and your baby. Legal drugs such as alcohol and caffeine are important issues for pregnant women. There is no safe amount of alcohol a woman can drink while pregnant. Fetal alcohol syndrome, a disorder characterized by growth retardation, facial abnormalities, and central nervous system dysfunction, is caused by a woman's use of alcohol during pregnancy. Caffeine, found in tea, coffee, soft drinks and chocolate, should also be limited. Be sure to read labels when trying to cut down on caffeine during pregnancy. More than 200 foods, beverages, and over-the-counter medications contain caffeine and have a high salt content! There are coffees and teas that do not contain caffeine. M Medical conditions such as diabetes, epilepsy, and high blood pressure should be treated and kept under control before pregnancy when possible,  but especially during pregnancy. Ask your caregiver about any medications that may need to be changed or adjusted during pregnancy. If you are currently taking any medications, ask your caregiver if it is safe to take them while you are pregnant or before getting pregnant when possible. Also, be sure to discuss any herbs or vitamins you are taking. They are medicines, too! Discuss with your doctor all medications, prescribed and over-the-counter, that you are taking. During your prenatal visit, discuss the medications your doctor may give you during labor and delivery. N Never be afraid to ask your doctor or caregiver questions about your health, the progress of the pregnancy, family problems, stressful situations, and recommendation for a pediatrician, if you do not have one. It is better to take all precautions and discuss any questions or concerns you may have during your office visits. It is a good idea to write down your questions before you visit the doctor. O Over-the-counter cough  and cold remedies may contain alcohol or other ingredients that should be avoided during pregnancy. Ask your caregiver about prescription, herbs or over-the-counter medications that you are taking or may consider taking while pregnant.  P Physical activity during pregnancy can benefit both you and your baby by lessening discomfort and fatigue, providing a sense of well-being, and increasing the likelihood of early recovery after delivery. Light to moderate exercise during pregnancy strengthens the belly (abdominal) and back muscles. This helps improve posture. Practicing yoga, walking, swimming, and cycling on a stationary bicycle are usually safe exercises for pregnant women. Avoid scuba diving, exercise at high altitudes (over 3000 feet), skiing, horseback riding, contact sports, etc. Always check with your doctor before beginning any kind of exercise, especially during pregnancy and especially if you did not exercise before getting pregnant. Q Queasiness, stomach upset and morning sickness are common during pregnancy. Eating a couple of crackers or dry toast before getting out of bed. Foods that you normally love may make you feel sick to your stomach. You may need to substitute other nutritious foods. Eating 5 or 6 small meals a day instead of 3 large ones may make you feel better. Do not drink with your meals, drink between meals. Questions that you have should be written down and asked during your prenatal visits. R Read about and make plans to baby-proof your home. There are important tips for making your home a safer environment for your baby. Review the tips and make your home safer for you and your baby. Read food labels regarding calories, salt and fat content in the food. S Saunas, hot tubs, and steam rooms should be avoided while you are pregnant. Excessive high heat may be harmful during your pregnancy. Your caregiver will screen and examine you for sexually transmitted diseases and genetic  disorders during your prenatal visits. Learn the signs of labor. Sexual relations while pregnant is safe unless there is a medical or pregnancy problem and your caregiver advises against it. T Traveling long distances should be avoided especially in the third trimester of your pregnancy. If you do have to travel out of state, be sure to take a copy of your medical records and medical insurance plan with you. You should not travel long distances without seeing your doctor first. Most airlines will not allow you to travel after 36 weeks of pregnancy. Toxoplasmosis is an infection caused by a parasite that can seriously harm an unborn baby. Avoid eating undercooked meat and handling cat litter. Be sure to wear gloves when  gardening. Tingling of the hands and fingers is not unusual and is due to fluid retention. This will go away after the baby is born. U Womb (uterus) size increases during the first trimester. Your kidneys will begin to function more efficiently. This may cause you to feel the need to urinate more often. You may also leak urine when sneezing, coughing or laughing. This is due to the growing uterus pressing against your bladder, which lies directly in front of and slightly under the uterus during the first few months of pregnancy. If you experience burning along with frequency of urination or bloody urine, be sure to tell your doctor. The size of your uterus in the third trimester may cause a problem with your balance. It is advisable to maintain good posture and avoid wearing high heels during this time. An ultrasound of your baby may be necessary during your pregnancy and is safe for you and your baby. V Vaccinations are an important concern for pregnant women. Get needed vaccines before pregnancy. Center for Disease Control ([employer]) has clear guidelines for the use of vaccines during pregnancy. Review the list, be sure to discuss it with your doctor. Prenatal vitamins are helpful and  healthy for you and the baby. Do not take extra vitamins except what is recommended. Taking too much of certain vitamins can cause overdose problems. Continuous vomiting should be reported to your caregiver. Varicose veins may appear especially if there is a family history of varicose veins. They should subside after the delivery of the baby. Support hose helps if there is leg discomfort. W Being overweight or underweight during pregnancy may cause problems. Try to get within 15 pounds of your ideal weight before pregnancy. Remember, pregnancy is not a time to be dieting! Do not stop eating or start skipping meals as your weight increases. Both you and your baby need the calories and nutrition you receive from a healthy diet. Be sure to consult with your doctor about your diet. There is a formula and diet plan available depending on whether you are overweight or underweight. Your caregiver or nutritionist can help and advise you if necessary. X Avoid X-rays. If you must have dental work or diagnostic tests, tell your dentist or physician that you are pregnant so that extra care can be taken. X-rays should only be taken when the risks of not taking them outweigh the risk of taking them. If needed, only the minimum amount of radiation should be used. When X-rays are necessary, protective lead shields should be used to cover areas of the body that are not being X-rayed. Y Your baby loves you. Breastfeeding your baby creates a loving and very close bond between the two of you. Give your baby a healthy environment to live in while you are pregnant. Infants and children require constant care and guidance. Their health and safety should be carefully watched at all times. After the baby is born, rest or take a nap when the baby is sleeping. Z Get your ZZZs. Be sure to get plenty of rest. Resting on your side as often as possible, especially on your left side is advised. It provides the best circulation to your baby  and helps reduce swelling. Try taking a nap for 30 to 45 minutes in the afternoon when possible. After the baby is born rest or take a nap when the baby is sleeping. Try elevating your feet for that amount of time when possible. It helps the circulation in your legs and helps  reduce swelling.  Most information courtesy of the CDC. Document Released: 09/11/2005 Document Revised: 05/24/2011 Document Reviewed: 05/26/2009 [facility] Patient Information 2012 [place], [place].

## 2011-11-22 NOTE — Progress Notes (Signed)
Patient states she was diagnosed with a "leaking heart valve" about 2 years ago. About 2 weeks ago started having chest tightness in upper chest area. Has been having lower abdominal pain, no bleeding.

## 2011-11-22 NOTE — Progress Notes (Signed)
History   26 yo g1p0 14 3/7 week IUP presents with complaints of  Chest pain that goes from above R breast up to neck and when I have palpitations my L arm sometimes gets numb, crampy in lower stomach, no bleeding or leaking, the discomfort increases with walking. Dr. Sharyn Lull cardiologist holter monitor on 2/18 no results yet. HX of CHTN x 2 years took Diavan prior to pregnancy ER visit on 2/15 for same complaints: results CBC,CMP, EKG, and Chest [facility] WNL  Chief Complaint  Patient presents with  . Abdominal Pain  . Chest Pain   @SFHPI @  OB History    Grav Para Term Preterm Abortions TAB SAB Ect Mult Living   1 0 0 0 0 0 0 0 0 0       Past Medical History  Diagnosis Date  . Leaky heart valve   . Pregnancy as incidental finding   . Hypertension     stopped meds since pregnant    Past Surgical History  Procedure Date  . No past surgeries     Family History  Problem Relation Age of Onset  . Hypertension Mother   . Diabetes Sister   . Hypertension Sister   . Anesthesia problems Neg Hx     History  Substance Use Topics  . Smoking status: Never Smoker   . Smokeless tobacco: Never Used  . Alcohol Use: Yes     OCC    Allergies: No Known Allergies   Physical Exam  Calm, quiet, oriented, no distress, lungs clear bilaterally, AP RRR, abd soft, nt, FHTS+, vag LTC, trace edema lower legs. Blood pressure 148/95, pulse 95, temperature 99.1 F (37.3 C), temperature source Oral, resp. rate 20, height 5\' 3"  (1.6 m), weight 143.7 kg (316 lb 12.8 oz), last menstrual period 08/13/2011, SpO2 100.00%. A 14 3/7 week IUP chest pain, palpitations, L arm numbness periodically P labetalol 100 mg now, home with R Labetalol100 mg BID, Ua pending, if bp improves plan for f/o as scheduled with Dr. Normand Sloop. Lavera Guise, CNM     Addendum: [id number] Dr. Normand Sloop updated per telephone on pt status still with palpitations and chest tightness, Labetalol 100 mg 1 hour ago, bps 152/78  145/81, Dr.  Normand Sloop will contact Dr. Sharyn Lull. Lavera Guise, CNM

## 2011-11-30 ENCOUNTER — Encounter (INDEPENDENT_AMBULATORY_CARE_PROVIDER_SITE_OTHER): Payer: Medicaid Other | Admitting: Obstetrics and Gynecology

## 2011-11-30 DIAGNOSIS — N760 Acute vaginitis: Secondary | ICD-10-CM

## 2011-11-30 DIAGNOSIS — Z331 Pregnant state, incidental: Secondary | ICD-10-CM

## 2011-12-21 ENCOUNTER — Encounter (INDEPENDENT_AMBULATORY_CARE_PROVIDER_SITE_OTHER): Payer: 59

## 2011-12-21 ENCOUNTER — Encounter (INDEPENDENT_AMBULATORY_CARE_PROVIDER_SITE_OTHER): Payer: 59 | Admitting: Obstetrics and Gynecology

## 2011-12-21 ENCOUNTER — Other Ambulatory Visit: Payer: 59

## 2011-12-21 DIAGNOSIS — O99019 Anemia complicating pregnancy, unspecified trimester: Secondary | ICD-10-CM

## 2011-12-21 DIAGNOSIS — O99210 Obesity complicating pregnancy, unspecified trimester: Secondary | ICD-10-CM

## 2011-12-21 DIAGNOSIS — Z1389 Encounter for screening for other disorder: Secondary | ICD-10-CM

## 2011-12-21 DIAGNOSIS — E669 Obesity, unspecified: Secondary | ICD-10-CM

## 2011-12-21 DIAGNOSIS — Z363 Encounter for antenatal screening for malformations: Secondary | ICD-10-CM

## 2011-12-21 DIAGNOSIS — O10019 Pre-existing essential hypertension complicating pregnancy, unspecified trimester: Secondary | ICD-10-CM

## 2011-12-21 DIAGNOSIS — Z331 Pregnant state, incidental: Secondary | ICD-10-CM

## 2012-01-15 ENCOUNTER — Other Ambulatory Visit: Payer: Self-pay

## 2012-01-15 DIAGNOSIS — R102 Pelvic and perineal pain: Secondary | ICD-10-CM

## 2012-01-17 DIAGNOSIS — I10 Essential (primary) hypertension: Secondary | ICD-10-CM

## 2012-01-17 DIAGNOSIS — D649 Anemia, unspecified: Secondary | ICD-10-CM | POA: Insufficient documentation

## 2012-01-18 ENCOUNTER — Other Ambulatory Visit: Payer: 59

## 2012-01-18 ENCOUNTER — Encounter: Payer: 59 | Admitting: Obstetrics and Gynecology

## 2012-01-22 ENCOUNTER — Ambulatory Visit (INDEPENDENT_AMBULATORY_CARE_PROVIDER_SITE_OTHER): Payer: 59

## 2012-01-22 ENCOUNTER — Ambulatory Visit (INDEPENDENT_AMBULATORY_CARE_PROVIDER_SITE_OTHER): Payer: 59 | Admitting: Obstetrics and Gynecology

## 2012-01-22 ENCOUNTER — Encounter: Payer: Self-pay | Admitting: Obstetrics and Gynecology

## 2012-01-22 ENCOUNTER — Other Ambulatory Visit: Payer: Self-pay | Admitting: Obstetrics and Gynecology

## 2012-01-22 VITALS — BP 120/88 | Wt 322.0 lb

## 2012-01-22 DIAGNOSIS — R102 Pelvic and perineal pain: Secondary | ICD-10-CM

## 2012-01-22 DIAGNOSIS — L292 Pruritus vulvae: Secondary | ICD-10-CM

## 2012-01-22 DIAGNOSIS — L293 Anogenital pruritus, unspecified: Secondary | ICD-10-CM

## 2012-01-22 DIAGNOSIS — I10 Essential (primary) hypertension: Secondary | ICD-10-CM

## 2012-01-22 DIAGNOSIS — B3731 Acute candidiasis of vulva and vagina: Secondary | ICD-10-CM

## 2012-01-22 DIAGNOSIS — B373 Candidiasis of vulva and vagina: Secondary | ICD-10-CM

## 2012-01-22 DIAGNOSIS — Z331 Pregnant state, incidental: Secondary | ICD-10-CM

## 2012-01-22 DIAGNOSIS — N949 Unspecified condition associated with female genital organs and menstrual cycle: Secondary | ICD-10-CM

## 2012-01-22 LAB — US OB FOLLOW UP

## 2012-01-22 MED ORDER — TERCONAZOLE 0.4 % VA CREA: 1.0000 | TOPICAL_CREAM | Freq: Every day | VAGINAL | Status: AC

## 2012-01-22 NOTE — Progress Notes (Signed)
C/o itching inside vulva area since yesterday & wants MD advice when to stop ASA

## 2012-01-22 NOTE — Progress Notes (Signed)
CHTN on Labetalol 100 mg BID    ASA 81 mg daily to stop at 36 weeks 1 hour Glucola 12/21/11 =192    3 hour GTT to schedule Ultrasound: S=D  Cervix=3.31 cm All anatomy previously poorly seen was seen today and normal  Wet prep: pH 5  Yeast   Rx: Terazol 7

## 2012-02-02 ENCOUNTER — Other Ambulatory Visit: Payer: Self-pay | Admitting: Obstetrics and Gynecology

## 2012-02-03 LAB — GLUCOSE TOLERANCE, 3 HOURS: Glucose Tolerance, Fasting: 100 mg/dL (ref 70–104)

## 2012-02-20 ENCOUNTER — Encounter: Payer: 59 | Admitting: Obstetrics and Gynecology

## 2012-02-29 ENCOUNTER — Encounter: Payer: 59 | Admitting: Obstetrics and Gynecology

## 2012-03-08 ENCOUNTER — Inpatient Hospital Stay (HOSPITAL_COMMUNITY)
Admission: AD | Admit: 2012-03-08 | Discharge: 2012-03-09 | Disposition: A | Discharge status: Home / Self Care | Payer: 59 | Source: Ambulatory Visit | Attending: Obstetrics and Gynecology | Admitting: Obstetrics and Gynecology

## 2012-03-08 ENCOUNTER — Encounter (HOSPITAL_COMMUNITY): Payer: Self-pay

## 2012-03-08 DIAGNOSIS — B9689 Other specified bacterial agents as the cause of diseases classified elsewhere: Secondary | ICD-10-CM

## 2012-03-08 DIAGNOSIS — N760 Acute vaginitis: Secondary | ICD-10-CM

## 2012-03-08 DIAGNOSIS — R109 Unspecified abdominal pain: Secondary | ICD-10-CM | POA: Insufficient documentation

## 2012-03-08 DIAGNOSIS — O99891 Other specified diseases and conditions complicating pregnancy: Secondary | ICD-10-CM | POA: Insufficient documentation

## 2012-03-08 DIAGNOSIS — IMO0002 Reserved for concepts with insufficient information to code with codable children: Secondary | ICD-10-CM

## 2012-03-08 DIAGNOSIS — O368190 Decreased fetal movements, unspecified trimester, not applicable or unspecified: Secondary | ICD-10-CM | POA: Insufficient documentation

## 2012-03-08 HISTORY — DX: Cardiac murmur, unspecified: R01.1

## 2012-03-08 LAB — COMPREHENSIVE METABOLIC PANEL
AST: 14 U/L (ref 0–37)
Albumin: 2.4 g/dL — ABNORMAL LOW (ref 3.5–5.2)
BUN: 6 mg/dL (ref 6–23)
CO2: 22 mEq/L (ref 19–32)
Calcium: 8.7 mg/dL (ref 8.4–10.5)
Creatinine, Ser: 0.66 mg/dL (ref 0.50–1.10)
GFR calc non Af Amer: >90 mL/min (ref >90)

## 2012-03-08 LAB — URINALYSIS, ROUTINE W REFLEX MICROSCOPIC
Bilirubin Urine: NEGATIVE
Glucose, UA: NEGATIVE mg/dL
Ketones, ur: 15 mg/dL — AB
pH: 6 (ref 5.0–8.0)

## 2012-03-08 LAB — CBC
Hemoglobin: 11.9 g/dL — ABNORMAL LOW (ref 12.0–15.0)
MCHC: 33.1 g/dL (ref 30.0–36.0)
RDW: 15 % (ref 11.5–15.5)

## 2012-03-08 LAB — WET PREP, GENITAL: Trich, Wet Prep: NONE SEEN

## 2012-03-08 LAB — URIC ACID: Uric Acid, Serum: 2.8 mg/dL (ref 2.4–7.0)

## 2012-03-08 LAB — DIFFERENTIAL
Basophils Absolute: 0 10*3/uL (ref 0.0–0.1)
Basophils Relative: 0 % (ref 0–1)
Neutro Abs: 8.3 10*3/uL — ABNORMAL HIGH (ref 1.7–7.7)
Neutrophils Relative %: 71 % (ref 43–77)

## 2012-03-08 LAB — LACTATE DEHYDROGENASE: LDH: 182 U/L (ref 94–250)

## 2012-03-08 LAB — FETAL FIBRONECTIN: Fetal Fibronectin: NEGATIVE

## 2012-03-08 MED ORDER — LABETALOL HCL 100 MG PO TABS
100.0000 mg | ORAL_TABLET | Freq: Once | ORAL | Status: AC
  Administered 2012-03-08: 100 mg via ORAL
  Filled 2012-03-08: qty 1

## 2012-03-08 NOTE — Progress Notes (Signed)
Results for orders placed during the hospital encounter of 03/08/12 (from the past 24 hour(s))  URINALYSIS, ROUTINE W REFLEX MICROSCOPIC     Status: Abnormal   Collection Time   03/08/12  8:15 PM      Component Value Range   Color, Urine YELLOW  YELLOW   APPearance CLEAR  CLEAR   Specific Gravity, Urine >1.030 (*) 1.005 - 1.030   pH 6.0  5.0 - 8.0   Glucose, UA NEGATIVE  NEGATIVE mg/dL   Hgb urine dipstick NEGATIVE  NEGATIVE   Bilirubin Urine NEGATIVE  NEGATIVE   Ketones, ur 15 (*) NEGATIVE mg/dL   Protein, ur NEGATIVE  NEGATIVE mg/dL   Urobilinogen, UA 1.0  0.0 - 1.0 mg/dL   Nitrite NEGATIVE  NEGATIVE   Leukocytes, UA NEGATIVE  NEGATIVE  WET PREP, GENITAL     Status: Abnormal   Collection Time   03/08/12  9:18 PM      Component Value Range   Yeast Wet Prep HPF POC NONE SEEN  NONE SEEN   Trich, Wet Prep NONE SEEN  NONE SEEN   Clue Cells Wet Prep HPF POC MODERATE (*) NONE SEEN   WBC, Wet Prep HPF POC FEW (*) NONE SEEN  FETAL FIBRONECTIN     Status: Normal   Collection Time   03/08/12  9:18 PM      Component Value Range   Fetal Fibronectin NEGATIVE  NEGATIVE  elevated bps, discussed with Dr. Su Hilt per telephone, plan labetalol 200 mg bid, give 100 mg now, PIH labs now. Lavera Guise, CNM

## 2012-03-08 NOTE — MAU Note (Signed)
Thursday pm saw some brown, vag d/c. This am had some pink/red spotting this am and then again this afternoon at work. Cramping since last night. Tightness and soreness upper abd and cramping lower abd. Feels baby moving but less than usual.

## 2012-03-08 NOTE — Progress Notes (Signed)
Results for orders placed during the hospital encounter of 03/08/12 (from the past 24 hour(s))  URINALYSIS, ROUTINE W REFLEX MICROSCOPIC     Status: Abnormal   Collection Time   03/08/12  8:15 PM      Component Value Range   Color, Urine YELLOW  YELLOW   APPearance CLEAR  CLEAR   Specific Gravity, Urine >1.030 (*) 1.005 - 1.030   pH 6.0  5.0 - 8.0   Glucose, UA NEGATIVE  NEGATIVE mg/dL   Hgb urine dipstick NEGATIVE  NEGATIVE   Bilirubin Urine NEGATIVE  NEGATIVE   Ketones, ur 15 (*) NEGATIVE mg/dL   Protein, ur NEGATIVE  NEGATIVE mg/dL   Urobilinogen, UA 1.0  0.0 - 1.0 mg/dL   Nitrite NEGATIVE  NEGATIVE   Leukocytes, UA NEGATIVE  NEGATIVE  WET PREP, GENITAL     Status: Abnormal   Collection Time   03/08/12  9:18 PM      Component Value Range   Yeast Wet Prep HPF POC NONE SEEN  NONE SEEN   Trich, Wet Prep NONE SEEN  NONE SEEN   Clue Cells Wet Prep HPF POC MODERATE (*) NONE SEEN   WBC, Wet Prep HPF POC FEW (*) NONE SEEN  FETAL FIBRONECTIN     Status: Normal   Collection Time   03/08/12  9:18 PM      Component Value Range   Fetal Fibronectin NEGATIVE  NEGATIVE  COMPREHENSIVE METABOLIC PANEL     Status: Abnormal   Collection Time   03/08/12 11:01 PM      Component Value Range   Sodium 131 (*) 135 - 145 mEq/L   Potassium 3.1 (*) 3.5 - 5.1 mEq/L   Chloride 97  96 - 112 mEq/L   CO2 22  19 - 32 mEq/L   Glucose, Bld 93  70 - 99 mg/dL   BUN 6  6 - 23 mg/dL   Creatinine, Ser 6.21  0.50 - 1.10 mg/dL   Calcium 8.7  8.4 - 30.8 mg/dL   Total Protein 7.2  6.0 - 8.3 g/dL   Albumin 2.4 (*) 3.5 - 5.2 g/dL   AST 14  0 - 37 U/L   ALT 18  0 - 35 U/L   Alkaline Phosphatase 101  39 - 117 U/L   Total Bilirubin 0.3  0.3 - 1.2 mg/dL   GFR calc non Af Amer >90  >90 mL/min   GFR calc Af Amer >90  >90 mL/min  URIC ACID     Status: Normal   Collection Time   03/08/12 11:01 PM      Component Value Range   Uric Acid, Serum 2.8  2.4 - 7.0 mg/dL  CBC     Status: Abnormal   Collection Time   03/08/12  11:01 PM      Component Value Range   WBC 11.7 (*) 4.0 - 10.5 K/uL   RBC 4.35  3.87 - 5.11 MIL/uL   Hemoglobin 11.9 (*) 12.0 - 15.0 g/dL   HCT 65.7 (*) 84.6 - 96.2 %   MCV 82.5  78.0 - 100.0 fL   MCH 27.4  26.0 - 34.0 pg   MCHC 33.1  30.0 - 36.0 g/dL   RDW 95.2  84.1 - 32.4 %   Platelets 242  150 - 400 K/uL  DIFFERENTIAL     Status: Abnormal   Collection Time   03/08/12 11:01 PM      Component Value Range   Neutrophils Relative 71  43 -  77 %   Neutro Abs 8.3 (*) 1.7 - 7.7 K/uL   Lymphocytes Relative 22  12 - 46 %   Lymphs Abs 2.6  0.7 - 4.0 K/uL   Monocytes Relative 6  3 - 12 %   Monocytes Absolute 0.7  0.1 - 1.0 K/uL   Eosinophils Relative 1  0 - 5 %   Eosinophils Absolute 0.1  0.0 - 0.7 K/uL   Basophils Relative 0  0 - 1 %   Basophils Absolute 0.0  0.0 - 0.1 K/uL  LACTATE DEHYDROGENASE     Status: Normal   Collection Time   03/08/12 11:01 PM      Component Value Range   LDH 182  94 - 250 U/L

## 2012-03-08 NOTE — Progress Notes (Signed)
History   c/o of cramping upper and lower abd and pink discharge x 1 week, denies srom, with +FM. Due for Labetalol now o n100 mg BID. Denies headache except for occasional not requiring medication, no visual spots or edema except to lower legs. Meds: PNV, labetalol 100 mg BID  Chief Complaint  Patient presents with  . Vaginal Bleeding  . Abdominal Cramping   @SFHPI @  OB History    Grav Para Term Preterm Abortions TAB SAB Ect Mult Living   1 0 0 0 0 0 0 0 0 0       Past Medical History  Diagnosis Date  . Leaky heart valve   . Pregnancy as incidental finding   . Hypertension     stopped meds since pregnant  . Heart murmur     Past Surgical History  Procedure Date  . No past surgeries     Family History  Problem Relation Age of Onset  . Hypertension Mother   . Cancer Mother 12    breast  . Diabetes Sister   . Hypertension Sister   . Thyroid disease Sister   . Anesthesia problems Neg Hx     History  Substance Use Topics  . Smoking status: Never Smoker   . Smokeless tobacco: Never Used  . Alcohol Use: No    Allergies: No Known Allergies  Prescriptions prior to admission  Medication Sig Dispense Refill  . aspirin EC 81 MG tablet Take 81 mg by mouth daily.      . calcium carbonate (TUMS - DOSED IN MG ELEMENTAL CALCIUM) 500 MG chewable tablet Chew 2 tablets by mouth daily as needed. For heartburn      . labetalol (NORMODYNE) 100 MG tablet Take 1 tablet (100 mg total) by mouth 2 (two) times daily.  30 tablet  7  . Prenatal Vit-Fe Fumarate-FA (PRENATAL MULTIVITAMIN) TABS Take 1 tablet by mouth daily.        @ROS @ Physical Exam  Calm, no distress, abd soft nt,no masses, not tympanic  Normal hair distrubition mons pubis,  EGBUS WNL,  sterile speculum exam vagina pink, moist normal rugae,  normal appearing cervix without discharge or lesions, cervical discharge present - white and creamy LTC, no cervical motion tenderness, No adnexal masses or tenderness  none  bleeding, +1 edema lower legs DTRS +1 bilaterally no clonus GC/CHL, FFN pending WET PREP pending A 29 5/7 week IUP discharge     BV P discussed bp, c/o for triage with Dr. Su Hilt plan for [facility] labs. Plan flagy, terazol 7.  Blood pressure 140/71, pulse 93, temperature 98.5 F (36.9 C), temperature source Oral, resp. rate 18, height 5\' 6"  (1.676 m), weight 324 lb 12.8 oz (147.328 kg), last menstrual period 08/13/2011.  Addendum: Results for orders placed during the hospital encounter of 03/08/12 (from the past 48 hour(s))  URINALYSIS, ROUTINE W REFLEX MICROSCOPIC     Status: Abnormal   Collection Time   03/08/12  8:15 PM      Component Value Range Comment   Color, Urine YELLOW  YELLOW    APPearance CLEAR  CLEAR    Specific Gravity, Urine >1.030 (*) 1.005 - 1.030    pH 6.0  5.0 - 8.0    Glucose, UA NEGATIVE  NEGATIVE mg/dL    Hgb urine dipstick NEGATIVE  NEGATIVE    Bilirubin Urine NEGATIVE  NEGATIVE    Ketones, ur 15 (*) NEGATIVE mg/dL    Protein, ur NEGATIVE  NEGATIVE mg/dL    Urobilinogen,  UA 1.0  0.0 - 1.0 mg/dL    Nitrite NEGATIVE  NEGATIVE    Leukocytes, UA NEGATIVE  NEGATIVE MICROSCOPIC NOT DONE ON URINES WITH NEGATIVE PROTEIN, BLOOD, LEUKOCYTES, NITRITE, OR GLUCOSE <1000 mg/dL.  WET PREP, GENITAL     Status: Abnormal   Collection Time   03/08/12  9:18 PM      Component Value Range Comment   Yeast Wet Prep HPF POC NONE SEEN  NONE SEEN    Trich, Wet Prep NONE SEEN  NONE SEEN    Clue Cells Wet Prep HPF POC MODERATE (*) NONE SEEN    WBC, Wet Prep HPF POC FEW (*) NONE SEEN MANY BACTERIA SEEN  FETAL FIBRONECTIN     Status: Normal   Collection Time   03/08/12  9:18 PM      Component Value Range Comment   Fetal Fibronectin NEGATIVE  NEGATIVE   COMPREHENSIVE METABOLIC PANEL     Status: Abnormal   Collection Time   03/08/12 11:01 PM      Component Value Range Comment   Sodium 131 (*) 135 - 145 mEq/L    Potassium 3.1 (*) 3.5 - 5.1 mEq/L    Chloride 97  96 - 112 mEq/L    CO2 22   19 - 32 mEq/L    Glucose, Bld 93  70 - 99 mg/dL    BUN 6  6 - 23 mg/dL    Creatinine, Ser 4.09  0.50 - 1.10 mg/dL    Calcium 8.7  8.4 - 81.1 mg/dL    Total Protein 7.2  6.0 - 8.3 g/dL    Albumin 2.4 (*) 3.5 - 5.2 g/dL    AST 14  0 - 37 U/L    ALT 18  0 - 35 U/L    Alkaline Phosphatase 101  39 - 117 U/L    Total Bilirubin 0.3  0.3 - 1.2 mg/dL    GFR calc non Af Amer >90  >90 mL/min    GFR calc Af Amer >90  >90 mL/min   URIC ACID     Status: Normal   Collection Time   03/08/12 11:01 PM      Component Value Range Comment   Uric Acid, Serum 2.8  2.4 - 7.0 mg/dL   CBC     Status: Abnormal   Collection Time   03/08/12 11:01 PM      Component Value Range Comment   WBC 11.7 (*) 4.0 - 10.5 K/uL    RBC 4.35  3.87 - 5.11 MIL/uL    Hemoglobin 11.9 (*) 12.0 - 15.0 g/dL    HCT 91.4 (*) 78.2 - 46.0 %    MCV 82.5  78.0 - 100.0 fL    MCH 27.4  26.0 - 34.0 pg    MCHC 33.1  30.0 - 36.0 g/dL    RDW 95.6  21.3 - 08.6 %    Platelets 242  150 - 400 K/uL   DIFFERENTIAL     Status: Abnormal   Collection Time   03/08/12 11:01 PM      Component Value Range Comment   Neutrophils Relative 71  43 - 77 %    Neutro Abs 8.3 (*) 1.7 - 7.7 K/uL    Lymphocytes Relative 22  12 - 46 %    Lymphs Abs 2.6  0.7 - 4.0 K/uL    Monocytes Relative 6  3 - 12 %    Monocytes Absolute 0.7  0.1 - 1.0 K/uL    Eosinophils  Relative 1  0 - 5 %    Eosinophils Absolute 0.1  0.0 - 0.7 K/uL    Basophils Relative 0  0 - 1 %    Basophils Absolute 0.0  0.0 - 0.1 K/uL   LACTATE DEHYDROGENASE     Status: Normal   Collection Time   03/08/12 11:01 PM      Component Value Range Comment   LDH 182  94 - 250 U/L

## 2012-03-08 NOTE — MAU Note (Signed)
Patient is in with c/o spotting light pink since last week. Denies recent intercourse. She also c/o upper abdominal constant tightening and lower abdominal cramps for a week. She denies lof. She reports decreased fetal movement today.

## 2012-03-09 LAB — GC/CHLAMYDIA PROBE AMP, GENITAL: Chlamydia, DNA Probe: NEGATIVE

## 2012-03-09 MED ORDER — TERCONAZOLE 0.4 % VA CREA: 1.0000 | TOPICAL_CREAM | Freq: Every day | VAGINAL | Status: AC

## 2012-03-09 MED ORDER — LABETALOL HCL 100 MG PO TABS: 200.0000 mg | ORAL_TABLET | Freq: Two times a day (BID) | ORAL | Status: DC

## 2012-03-09 MED ORDER — METRONIDAZOLE 500 MG PO TABS: 500.0000 mg | ORAL_TABLET | Freq: Two times a day (BID) | ORAL | Status: AC

## 2012-03-09 NOTE — Discharge Instructions (Signed)

## 2012-03-09 NOTE — Progress Notes (Signed)
C/o of itch peeling skin under L breast using baby ointment A brast with 4-8 cm of peeling dry skin discolored P nystatin ointment discussed, home with RX flagy, gynezole, labetalol 200 mg BID, and 24 hour protein and creatinine collection to office on Monday. F/o up as scheduled. Lavera Guise, CNM

## 2012-03-18 ENCOUNTER — Ambulatory Visit (INDEPENDENT_AMBULATORY_CARE_PROVIDER_SITE_OTHER): Payer: 59 | Admitting: Obstetrics and Gynecology

## 2012-03-18 ENCOUNTER — Encounter: Payer: Self-pay | Admitting: Obstetrics and Gynecology

## 2012-03-18 VITALS — BP 128/84 | Ht 64.5 in | Wt 322.0 lb

## 2012-03-18 DIAGNOSIS — O0990 Supervision of high risk pregnancy, unspecified, unspecified trimester: Secondary | ICD-10-CM

## 2012-03-18 DIAGNOSIS — I10 Essential (primary) hypertension: Secondary | ICD-10-CM

## 2012-03-18 NOTE — Progress Notes (Signed)
C/O of burning sensation in top part of abd C/O of cramps in hips & legs

## 2012-03-18 NOTE — Progress Notes (Signed)
Abdomen nontender.  No masses. Ultrasound and biophysical profile next week. Return to office in 1 week. Dr. Stefano Gaul

## 2012-03-27 ENCOUNTER — Encounter: Payer: Self-pay | Admitting: Obstetrics and Gynecology

## 2012-03-27 ENCOUNTER — Ambulatory Visit (INDEPENDENT_AMBULATORY_CARE_PROVIDER_SITE_OTHER): Payer: Self-pay | Admitting: Obstetrics and Gynecology

## 2012-03-27 ENCOUNTER — Other Ambulatory Visit: Payer: Self-pay | Admitting: Obstetrics and Gynecology

## 2012-03-27 ENCOUNTER — Ambulatory Visit (INDEPENDENT_AMBULATORY_CARE_PROVIDER_SITE_OTHER): Payer: 59

## 2012-03-27 VITALS — BP 140/80 | Wt 319.0 lb

## 2012-03-27 DIAGNOSIS — O0990 Supervision of high risk pregnancy, unspecified, unspecified trimester: Secondary | ICD-10-CM

## 2012-03-27 DIAGNOSIS — I10 Essential (primary) hypertension: Secondary | ICD-10-CM

## 2012-03-27 NOTE — Progress Notes (Signed)
BPP 8 over 8. Ultrasound: Single gestation, normal fluid, normal anatomy, vertex, posterior placenta, 33 weeks and 5 days (79th percentile). Increase the labetalol to 200 mg twice each day. Return to office 1 week.  Biophysical profile next week.  Repeat BP 134/82.  Dr. Stefano Gaul

## 2012-04-01 LAB — US OB FOLLOW UP

## 2012-04-02 ENCOUNTER — Encounter: Payer: Self-pay | Admitting: Obstetrics and Gynecology

## 2012-04-02 ENCOUNTER — Ambulatory Visit (INDEPENDENT_AMBULATORY_CARE_PROVIDER_SITE_OTHER): Payer: 59 | Admitting: Obstetrics and Gynecology

## 2012-04-02 ENCOUNTER — Ambulatory Visit (INDEPENDENT_AMBULATORY_CARE_PROVIDER_SITE_OTHER): Payer: 59

## 2012-04-02 VITALS — BP 138/88 | Wt 316.5 lb

## 2012-04-02 DIAGNOSIS — I10 Essential (primary) hypertension: Secondary | ICD-10-CM

## 2012-04-02 DIAGNOSIS — O139 Gestational [pregnancy-induced] hypertension without significant proteinuria, unspecified trimester: Secondary | ICD-10-CM

## 2012-04-02 NOTE — Progress Notes (Signed)
No complaints

## 2012-04-02 NOTE — Progress Notes (Signed)
[redacted]w[redacted]d Hypertension Continues on Labetalol 200mg s BID No complaints BPP 8/8 Vx Placenta posterior AFI normal (85th%) ROB x 1 week Interested in water birth - info given - advised to attend the class at [facility] before making descision

## 2012-04-10 ENCOUNTER — Encounter: Payer: 59 | Admitting: Obstetrics and Gynecology

## 2012-04-10 ENCOUNTER — Encounter: Payer: Self-pay | Admitting: Obstetrics and Gynecology

## 2012-05-02 ENCOUNTER — Telehealth: Payer: Self-pay | Admitting: Obstetrics and Gynecology

## 2012-05-03 ENCOUNTER — Ambulatory Visit (INDEPENDENT_AMBULATORY_CARE_PROVIDER_SITE_OTHER): Payer: Medicaid Other | Admitting: Obstetrics and Gynecology

## 2012-05-03 VITALS — BP 132/86 | Wt 334.0 lb

## 2012-05-03 DIAGNOSIS — O09899 Supervision of other high risk pregnancies, unspecified trimester: Secondary | ICD-10-CM

## 2012-05-03 DIAGNOSIS — R7309 Other abnormal glucose: Secondary | ICD-10-CM

## 2012-05-03 DIAGNOSIS — R079 Chest pain, unspecified: Secondary | ICD-10-CM

## 2012-05-03 DIAGNOSIS — I38 Endocarditis, valve unspecified: Secondary | ICD-10-CM

## 2012-05-03 DIAGNOSIS — I10 Essential (primary) hypertension: Secondary | ICD-10-CM

## 2012-05-03 DIAGNOSIS — Z91199 Patient's noncompliance with other medical treatment and regimen due to unspecified reason: Secondary | ICD-10-CM

## 2012-05-03 DIAGNOSIS — Z9119 Patient's noncompliance with other medical treatment and regimen: Secondary | ICD-10-CM

## 2012-05-03 DIAGNOSIS — I080 Rheumatic disorders of both mitral and aortic valves: Secondary | ICD-10-CM

## 2012-05-03 NOTE — Progress Notes (Signed)
Pt c/o headaches and seeing spots. Pt desires cervix check today.

## 2012-05-07 ENCOUNTER — Other Ambulatory Visit: Payer: Self-pay | Admitting: Obstetrics and Gynecology

## 2012-05-07 ENCOUNTER — Ambulatory Visit (INDEPENDENT_AMBULATORY_CARE_PROVIDER_SITE_OTHER): Payer: Medicaid Other

## 2012-05-07 ENCOUNTER — Encounter: Payer: Self-pay | Admitting: Obstetrics and Gynecology

## 2012-05-07 ENCOUNTER — Ambulatory Visit (INDEPENDENT_AMBULATORY_CARE_PROVIDER_SITE_OTHER): Payer: Medicaid Other | Admitting: Obstetrics and Gynecology

## 2012-05-07 VITALS — BP 138/76 | Ht 64.5 in | Wt 332.0 lb

## 2012-05-07 DIAGNOSIS — O169 Unspecified maternal hypertension, unspecified trimester: Secondary | ICD-10-CM

## 2012-05-07 DIAGNOSIS — Z331 Pregnant state, incidental: Secondary | ICD-10-CM

## 2012-05-07 DIAGNOSIS — O139 Gestational [pregnancy-induced] hypertension without significant proteinuria, unspecified trimester: Secondary | ICD-10-CM

## 2012-05-07 NOTE — Progress Notes (Signed)
  Ultrasound shows:  EFW 8lbs15oz >90%ILE        [place] EDD: 05/19/12             AFI: 16.0cm 60th%tile            Cervical length: not measured             Placenta localization: posterior            Fetal presentation: cephalic    Anatomy survey completed yes    Anatomy survey is normal            Gender : female    Comments:Score BPP 8 of 8 in 27 minutes. Technically difficult study due to advanced GA and maternal habitus.

## 2012-05-07 NOTE — Progress Notes (Signed)
Pt c/o headaches and seeing spots. Request cervix check.  Pt has been non compliant with visits because of insurance change.  Declined to wait to be seen on 05/03/12 Good fetal movement 3 hrGTT with single abnormal value cx 2/50/-3 today Plan induction at 39 weeks for chronic HBP  Pt will return to office for BPP today

## 2012-05-08 LAB — US OB FOLLOW UP

## 2012-05-10 ENCOUNTER — Other Ambulatory Visit: Payer: Self-pay

## 2012-05-10 ENCOUNTER — Ambulatory Visit (INDEPENDENT_AMBULATORY_CARE_PROVIDER_SITE_OTHER): Payer: Medicaid Other | Admitting: Obstetrics and Gynecology

## 2012-05-10 ENCOUNTER — Encounter: Payer: Self-pay | Admitting: Obstetrics and Gynecology

## 2012-05-10 ENCOUNTER — Other Ambulatory Visit (INDEPENDENT_AMBULATORY_CARE_PROVIDER_SITE_OTHER): Payer: Medicaid Other

## 2012-05-10 ENCOUNTER — Encounter (HOSPITAL_COMMUNITY): Payer: Self-pay | Admitting: *Deleted

## 2012-05-10 ENCOUNTER — Telehealth (HOSPITAL_COMMUNITY): Payer: Self-pay | Admitting: *Deleted

## 2012-05-10 VITALS — BP 142/72 | Wt 333.0 lb

## 2012-05-10 DIAGNOSIS — I10 Essential (primary) hypertension: Secondary | ICD-10-CM

## 2012-05-10 DIAGNOSIS — O0990 Supervision of high risk pregnancy, unspecified, unspecified trimester: Secondary | ICD-10-CM

## 2012-05-10 DIAGNOSIS — O169 Unspecified maternal hypertension, unspecified trimester: Secondary | ICD-10-CM

## 2012-05-10 NOTE — Progress Notes (Signed)
[redacted]w[redacted]d USS BPP today prior to scheduled IOL 05/12/12. Result: Vx presentation, posterior placenta. Normal Fluid 80th %tile BPP 8/8 in 12 mins. Scheduled for Evening IOL.

## 2012-05-10 NOTE — Telephone Encounter (Signed)
Preadmission screen  

## 2012-05-12 ENCOUNTER — Inpatient Hospital Stay (HOSPITAL_COMMUNITY)
Admission: AD | Admit: 2012-05-12 | Discharge: 2012-05-17 | DRG: 766 | Disposition: A | Discharge status: Home / Self Care | Payer: Medicaid Other | Source: Ambulatory Visit | Attending: Obstetrics and Gynecology | Admitting: Obstetrics and Gynecology

## 2012-05-12 DIAGNOSIS — O09899 Supervision of other high risk pregnancies, unspecified trimester: Secondary | ICD-10-CM

## 2012-05-12 DIAGNOSIS — R079 Chest pain, unspecified: Secondary | ICD-10-CM

## 2012-05-12 DIAGNOSIS — Z91199 Patient's noncompliance with other medical treatment and regimen due to unspecified reason: Secondary | ICD-10-CM | POA: Insufficient documentation

## 2012-05-12 DIAGNOSIS — O1002 Pre-existing essential hypertension complicating childbirth: Principal | ICD-10-CM | POA: Diagnosis present

## 2012-05-12 DIAGNOSIS — Z9119 Patient's noncompliance with other medical treatment and regimen: Secondary | ICD-10-CM

## 2012-05-12 DIAGNOSIS — I38 Endocarditis, valve unspecified: Secondary | ICD-10-CM | POA: Diagnosis present

## 2012-05-12 DIAGNOSIS — Z98891 History of uterine scar from previous surgery: Secondary | ICD-10-CM

## 2012-05-12 DIAGNOSIS — O99814 Abnormal glucose complicating childbirth: Secondary | ICD-10-CM | POA: Diagnosis present

## 2012-05-12 DIAGNOSIS — R7309 Other abnormal glucose: Secondary | ICD-10-CM | POA: Insufficient documentation

## 2012-05-12 DIAGNOSIS — O9903 Anemia complicating the puerperium: Secondary | ICD-10-CM | POA: Diagnosis not present

## 2012-05-12 DIAGNOSIS — D649 Anemia, unspecified: Secondary | ICD-10-CM | POA: Diagnosis not present

## 2012-05-12 DIAGNOSIS — I10 Essential (primary) hypertension: Secondary | ICD-10-CM | POA: Diagnosis present

## 2012-05-12 DIAGNOSIS — E669 Obesity, unspecified: Secondary | ICD-10-CM | POA: Diagnosis present

## 2012-05-12 LAB — URINALYSIS, ROUTINE W REFLEX MICROSCOPIC
Bilirubin Urine: NEGATIVE
Hgb urine dipstick: NEGATIVE
Ketones, ur: NEGATIVE mg/dL
Specific Gravity, Urine: 1.02 (ref 1.005–1.030)
Urobilinogen, UA: 0.2 mg/dL (ref 0.0–1.0)

## 2012-05-12 LAB — CBC
HCT: 35.6 % — ABNORMAL LOW (ref 36.0–46.0)
Hemoglobin: 11.6 g/dL — ABNORMAL LOW (ref 12.0–15.0)
RBC: 4.43 MIL/uL (ref 3.87–5.11)
WBC: 7.7 10*3/uL (ref 4.0–10.5)

## 2012-05-12 LAB — COMPREHENSIVE METABOLIC PANEL
ALT: 13 U/L (ref 0–35)
AST: 12 U/L (ref 0–37)
Alkaline Phosphatase: 135 U/L — ABNORMAL HIGH (ref 39–117)
CO2: 24 mEq/L (ref 19–32)
Calcium: 9.3 mg/dL (ref 8.4–10.5)
Chloride: 99 mEq/L (ref 96–112)
GFR calc non Af Amer: >90 mL/min (ref >90)
Potassium: 3.9 mEq/L (ref 3.5–5.1)
Sodium: 135 mEq/L (ref 135–145)
Total Bilirubin: 0.3 mg/dL (ref 0.3–1.2)

## 2012-05-12 LAB — LACTATE DEHYDROGENASE: LDH: 203 U/L (ref 94–250)

## 2012-05-12 LAB — URIC ACID: Uric Acid, Serum: 2.5 mg/dL (ref 2.4–7.0)

## 2012-05-12 MED ORDER — OXYCODONE-ACETAMINOPHEN 5-325 MG PO TABS: 1.0000 | ORAL_TABLET | ORAL | Status: DC | PRN

## 2012-05-12 MED ORDER — LABETALOL HCL 200 MG PO TABS
200.0000 mg | ORAL_TABLET | Freq: Two times a day (BID) | ORAL | Status: DC
  Administered 2012-05-12 – 2012-05-13 (×2): 200 mg via ORAL
  Filled 2012-05-12 (×4): qty 1

## 2012-05-12 MED ORDER — ACETAMINOPHEN 325 MG PO TABS
650.0000 mg | ORAL_TABLET | ORAL | Status: DC | PRN
  Administered 2012-05-14: 650 mg via ORAL
  Filled 2012-05-12: qty 2

## 2012-05-12 MED ORDER — CALCIUM CARBONATE ANTACID 500 MG PO CHEW: 2.0000 | CHEWABLE_TABLET | Freq: Every day | ORAL | Status: DC | PRN

## 2012-05-12 MED ORDER — OXYTOCIN 10 UNIT/ML IJ SOLN: 10.0000 [IU] | Freq: Once | INTRAMUSCULAR | Status: DC

## 2012-05-12 MED ORDER — ONDANSETRON HCL 4 MG/2ML IJ SOLN
4.0000 mg | Freq: Four times a day (QID) | INTRAMUSCULAR | Status: DC | PRN
  Administered 2012-05-14: 4 mg via INTRAVENOUS
  Filled 2012-05-12: qty 2

## 2012-05-12 MED ORDER — MISOPROSTOL 25 MCG QUARTER TABLET
25.0000 ug | ORAL_TABLET | ORAL | Status: DC | PRN
  Administered 2012-05-12 – 2012-05-13 (×2): 25 ug via VAGINAL
  Filled 2012-05-12 (×2): qty 0.25

## 2012-05-12 MED ORDER — SODIUM CHLORIDE 0.9 % IJ SOLN: 3.0000 mL | Freq: Two times a day (BID) | INTRAMUSCULAR | Status: DC

## 2012-05-12 MED ORDER — OXYTOCIN 40 UNITS IN LACTATED RINGERS INFUSION - SIMPLE MED: 62.5000 mL/h | Freq: Once | INTRAVENOUS | Status: DC

## 2012-05-12 MED ORDER — TERBUTALINE SULFATE 1 MG/ML IJ SOLN: 0.2500 mg | Freq: Once | INTRAMUSCULAR | Status: AC | PRN

## 2012-05-12 MED ORDER — LABETALOL HCL 5 MG/ML IV SOLN
10.0000 mg | INTRAVENOUS | Status: DC | PRN
  Administered 2012-05-13: 10 mg via INTRAVENOUS
  Filled 2012-05-12 (×4): qty 4

## 2012-05-12 MED ORDER — ZOLPIDEM TARTRATE 5 MG PO TABS
5.0000 mg | ORAL_TABLET | Freq: Every evening | ORAL | Status: DC | PRN
  Administered 2012-05-13: 5 mg via ORAL
  Filled 2012-05-12: qty 1

## 2012-05-12 MED ORDER — LACTATED RINGERS IV SOLN
INTRAVENOUS | Status: DC
  Administered 2012-05-12 – 2012-05-13 (×4): via INTRAVENOUS

## 2012-05-12 MED ORDER — CITRIC ACID-SODIUM CITRATE 334-500 MG/5ML PO SOLN
30.0000 mL | ORAL | Status: DC | PRN
  Administered 2012-05-14: 30 mL via ORAL
  Filled 2012-05-12: qty 15

## 2012-05-12 MED ORDER — OXYTOCIN BOLUS FROM INFUSION
250.0000 mL | Freq: Once | INTRAVENOUS | Status: DC
  Filled 2012-05-12: qty 500

## 2012-05-12 MED ORDER — LACTATED RINGERS IV SOLN: 500.0000 mL | INTRAVENOUS | Status: DC | PRN

## 2012-05-12 MED ORDER — IBUPROFEN 600 MG PO TABS: 600.0000 mg | ORAL_TABLET | Freq: Four times a day (QID) | ORAL | Status: DC | PRN

## 2012-05-12 MED ORDER — LIDOCAINE HCL (PF) 1 % IJ SOLN: 30.0000 mL | INTRAMUSCULAR | Status: DC | PRN

## 2012-05-12 NOTE — H&P (Signed)
Autumn [name] is a 26 y.o. female presenting for IOL secondary to [facility] on labetalol. Pt denies reg ctx, no VB, or LOF, reports GFM. Pt denies any HA/N/V/RUQ pain, reports has episodes of chest pain, none right now.   HPI: Pt began [facility] at [facility] at 14wks, w EDC of 05/19/12  based on LMP.  Pt was seen at [facility] ED at 12wks, on 2/15 for c/o CP, had normal EKG and labs. Was seen again on 2/22 at [facility] for similar c/o. She was started on labetalol PO at that time for elevated BP's. She was seen by cardiology on 2/18 and had 24hr heart monitor. Results of that testing and any recommendation per cardiology (Dr Sharyn Lull) are not available at the time of this note. Seen by [facility] cardiology at about 17 weeks, again those notes are not available. Pt was on daily baby ASA until 36wks.  Pt had anatomy US at 18wks and was WNL, however limited views. She had an early 1hr gtt that was 192 on 12/21/11.  A 3hr gtt was then scheduled for 4/11, however pt did not complete it until 5/10,  at [redacted]w[redacted]d that had 1 abnormal value. A f/u US at 23wks showed all anatomy as normal, and pt had a normal quad screen. She was seen in MAU at 29wks for PTL work-up and elevated BP, FFN was neg, she was tx'd for BV, GC/CT were neg, and labetalol was increased to 200mg  BID, PIH labs were WNL, a 24hour urine was ordered but never completed by the pt. At 33wks BPP 8/8, and growth was 79%. On 8-13 EFW = 8#15oz - >90%, AFI normal at 60% and GBS at that time was collected and neg. Cervical exam that day was 2cm/50/-3 per Dr Pennie Rushing and pt was then scheduled for induction at 39wks.  Pt had lapse in [facility] from 18wks-23wks, again until 31wks and again from 33-38wks.    Maternal Medical History:  Reason for admission: Reason for Admission:   nauseaIOL secondary to [facility] on labetalol   Contractions: denies  Fetal activity: Perceived fetal activity is normal.   Last perceived fetal movement was within the past hour.    Prenatal complications: Hypertension.      OB History    Grav Para Term Preterm Abortions TAB SAB Ect Mult Living   1 0 0 0 0 0 0 0 0 0      Past Medical History  Diagnosis Date  . Leaky heart valve   . Pregnancy as incidental finding   . Hypertension     stopped meds since pregnant  . Heart murmur   . H/O varicella   . Obese    Past Surgical History  Procedure Date  . No past surgeries    Family History: family history includes Anemia in her sister; Cancer (age of onset:50) in her mother; Diabetes in her sister; Heart disease in her father; Hypertension in her mother and sister; and Thyroid disease in her sister.  There is no history of Anesthesia problems. Social History:  reports that she has never smoked. She has never used smokeless tobacco. She reports that she does not drink alcohol or use illicit drugs.   Prenatal Transfer Tool  Maternal Diabetes: No 1hr gtt at 18wk =192, 3hr gtt at 24wks w 1 abnormal value Genetic Screening: Normal Maternal Ultrasounds/Referrals: Normal  Fetal Ultrasounds or other Referrals:  None Maternal Substance Abuse:  No Significant Maternal Medications:  Meds include: Other: see prenatal record labetalol  Significant Maternal Lab  Results:  none Other Comments:  pt w CHTN, insufficient PNC   Review of Systems  Eyes: Negative for blurred vision and double vision.  Respiratory: Negative for shortness of breath.   Cardiovascular: Positive for chest pain.       Pt has hx of CP, states has been having last 3 days, none now  Gastrointestinal: Negative for heartburn, nausea, vomiting and abdominal pain.  Neurological: Negative for headaches.  All other systems reviewed and are negative.      Blood pressure 158/82, pulse 81, temperature 98.1 F (36.7 C), temperature source Oral, resp. rate 18, height 5\' 4"  (1.626 m), weight 333 lb (151.048 kg), last menstrual period 08/13/2011. Maternal Exam:  Uterine Assessment: Contraction strength is mild.  Contraction duration is 20 seconds.  Contraction frequency is irregular.   Abdomen: Patient reports no abdominal tenderness. Estimated fetal weight is 8-9.   Fetal presentation: vertex  Introitus: Normal vulva. Normal vagina.  Vagina is negative for discharge.  Pelvis: adequate for delivery.   Cervix: Cervix evaluated by digital exam.     Fetal Exam Fetal Monitor Review: Mode: ultrasound.   Baseline rate: 140.  Variability: moderate (6-25 bpm).   Pattern: accelerations present and no decelerations.    Fetal State Assessment: Category I - tracings are normal.     Physical Exam  Nursing note and vitals reviewed. Constitutional: She is oriented to person, place, and time. She appears well-developed and well-nourished.  HENT:  Head: Normocephalic.  Eyes: Pupils are equal, round, and reactive to light.  Neck: Normal range of motion.  Cardiovascular: Normal rate and regular rhythm.   Murmur heard. Respiratory: Effort normal and breath sounds normal.  GI: Soft. Bowel sounds are normal.  Genitourinary: Vagina normal. No vaginal discharge found.  Musculoskeletal: Normal range of motion. She exhibits edema. She exhibits no tenderness.       2+ bilateral LEE Reflexes 2+ Neg clonus   Neurological: She is alert and oriented to person, place, and time. She has normal reflexes.  Skin: Skin is warm and dry.  Psychiatric: She has a normal mood and affect. Her behavior is normal.    Prenatal labs: ABO, Rh: B/Positive/-- (02/25 0000) Antibody: Negative (02/25 0000) Rubella: Immune (02/25 0000) RPR: Nonreactive (02/25 0000)  HBsAg: Negative (02/25 0000)  HIV: Non-reactive (02/25 0000)  GBS: NEGATIVE (08/13 1159)  GC/CT neg Quad screen WNL 1hr gtt =192, 3hr gtt w 1 abnormal value   Assessment/Plan: IUP at 39wks FHR reassuring GBS neg CHTN - on labetalol, 200mg  BID Morbid obesity Cardiac issues  Admit to b.s. Per c/w Dr Estanislado Pandy Routine [facility] orders cytotec PV q4h overnight, pitocin in the AM if  appropriate Will check PIH labs CCUA  Begin 24hr urine collection Pt may eat tonight and clears tomorrow  ambien for sleep Analgesia/anesthesia PRN  Labetalol 200mg  PO BID  Labetalol 10mg  IV PRN BP >160/105     Brandice Busser M 05/12/2012, 8:42 PM

## 2012-05-13 ENCOUNTER — Encounter (HOSPITAL_COMMUNITY): Payer: Self-pay | Admitting: Anesthesiology

## 2012-05-13 ENCOUNTER — Inpatient Hospital Stay (HOSPITAL_COMMUNITY): Admission: RE | Admit: 2012-05-13 | Payer: Medicaid Other | Source: Ambulatory Visit

## 2012-05-13 ENCOUNTER — Inpatient Hospital Stay (HOSPITAL_COMMUNITY): Payer: Medicaid Other | Admitting: Anesthesiology

## 2012-05-13 ENCOUNTER — Encounter (HOSPITAL_COMMUNITY): Payer: Self-pay

## 2012-05-13 LAB — CBC
HCT: 36.9 % (ref 36.0–46.0)
RBC: 4.6 MIL/uL (ref 3.87–5.11)
RDW: 14.1 % (ref 11.5–15.5)
WBC: 12.2 10*3/uL — ABNORMAL HIGH (ref 4.0–10.5)

## 2012-05-13 LAB — TYPE AND SCREEN
ABO/RH(D): B POS
Antibody Screen: NEGATIVE

## 2012-05-13 LAB — GLUCOSE, CAPILLARY
Glucose-Capillary: 108 mg/dL — ABNORMAL HIGH (ref 70–99)
Glucose-Capillary: 114 mg/dL — ABNORMAL HIGH (ref 70–99)
Glucose-Capillary: 118 mg/dL — ABNORMAL HIGH (ref 70–99)

## 2012-05-13 LAB — HEMOGLOBIN A1C
Hgb A1c MFr Bld: 6 % — ABNORMAL HIGH (ref <5.7)
Mean Plasma Glucose: 126 mg/dL — ABNORMAL HIGH (ref <117)

## 2012-05-13 LAB — RPR: RPR Ser Ql: NONREACTIVE

## 2012-05-13 MED ORDER — DIPHENHYDRAMINE HCL 50 MG/ML IJ SOLN: 12.5000 mg | INTRAMUSCULAR | Status: DC | PRN

## 2012-05-13 MED ORDER — FENTANYL 2.5 MCG/ML BUPIVACAINE 1/10 % EPIDURAL INFUSION (WH - ANES)
14.0000 mL/h | INTRAMUSCULAR | Status: DC
  Administered 2012-05-13 – 2012-05-14 (×2): 14 mL/h via EPIDURAL
  Filled 2012-05-13 (×3): qty 60

## 2012-05-13 MED ORDER — LIDOCAINE HCL (PF) 1 % IJ SOLN
INTRAMUSCULAR | Status: DC | PRN
  Administered 2012-05-13 (×2): 8 mL

## 2012-05-13 MED ORDER — PHENYLEPHRINE 40 MCG/ML (10ML) SYRINGE FOR IV PUSH (FOR BLOOD PRESSURE SUPPORT): 80.0000 ug | PREFILLED_SYRINGE | INTRAVENOUS | Status: DC | PRN

## 2012-05-13 MED ORDER — FENTANYL CITRATE 0.05 MG/ML IJ SOLN
100.0000 ug | INTRAMUSCULAR | Status: DC | PRN
  Administered 2012-05-13 (×2): 100 ug via INTRAVENOUS
  Filled 2012-05-13 (×3): qty 2

## 2012-05-13 MED ORDER — FENTANYL 2.5 MCG/ML BUPIVACAINE 1/10 % EPIDURAL INFUSION (WH - ANES)
INTRAMUSCULAR | Status: DC | PRN
  Administered 2012-05-13: 1 mL/h via EPIDURAL

## 2012-05-13 MED ORDER — TERBUTALINE SULFATE 1 MG/ML IJ SOLN: 0.2500 mg | Freq: Once | INTRAMUSCULAR | Status: AC | PRN

## 2012-05-13 MED ORDER — LABETALOL HCL 200 MG PO TABS
200.0000 mg | ORAL_TABLET | Freq: Two times a day (BID) | ORAL | Status: DC
  Administered 2012-05-14 – 2012-05-17 (×7): 200 mg via ORAL
  Filled 2012-05-13 (×10): qty 1

## 2012-05-13 MED ORDER — OXYTOCIN 40 UNITS IN LACTATED RINGERS INFUSION - SIMPLE MED
1.0000 m[IU]/min | INTRAVENOUS | Status: DC
  Administered 2012-05-13: 1 m[IU]/min via INTRAVENOUS
  Filled 2012-05-13: qty 1000

## 2012-05-13 MED ORDER — EPHEDRINE 5 MG/ML INJ
10.0000 mg | INTRAVENOUS | Status: DC | PRN
  Filled 2012-05-13 (×2): qty 4

## 2012-05-13 MED ORDER — OXYTOCIN 40 UNITS IN LACTATED RINGERS INFUSION - SIMPLE MED: 1.0000 m[IU]/min | INTRAVENOUS | Status: DC

## 2012-05-13 MED ORDER — LACTATED RINGERS IV SOLN
500.0000 mL | Freq: Once | INTRAVENOUS | Status: AC
  Administered 2012-05-13: 500 mL via INTRAVENOUS

## 2012-05-13 MED ORDER — EPHEDRINE 5 MG/ML INJ: 10.0000 mg | INTRAVENOUS | Status: DC | PRN

## 2012-05-13 MED ORDER — PHENYLEPHRINE 40 MCG/ML (10ML) SYRINGE FOR IV PUSH (FOR BLOOD PRESSURE SUPPORT)
80.0000 ug | PREFILLED_SYRINGE | INTRAVENOUS | Status: DC | PRN
  Filled 2012-05-13 (×2): qty 5

## 2012-05-13 NOTE — Anesthesia Procedure Notes (Signed)
Epidural Patient location during procedure: OB Start time: 05/13/2012 8:33 PM End time: 05/13/2012 8:40 PM Reason for block: procedure for pain  Staffing Anesthesiologist: Sandrea Hughs  Preanesthetic Checklist Completed: patient identified, site marked, surgical consent, pre-op evaluation, timeout performed, IV checked, risks and benefits discussed and monitors and equipment checked  Epidural Patient position: sitting Prep: site prepped and draped and DuraPrep Patient monitoring: continuous pulse ox and blood pressure Approach: midline Injection technique: LOR air  Needle:  Needle type: Tuohy  Needle gauge: 17 G Needle length: 9 cm Needle insertion depth: 9 cm Catheter type: closed end flexible Catheter size: 19 Gauge Catheter at skin depth: 15 cm Test dose: negative and Other  Assessment Sensory level: T10 Events: blood not aspirated, injection not painful, no injection resistance, negative IV test and no paresthesia

## 2012-05-13 NOTE — Progress Notes (Signed)
Comfortable, Foley bulb, out at 16.05 hrs O VSS    BP 153/75  Pulse 87  Temp 98 F (36.7 C) (Oral)  Resp 20  Ht 5\' 4"  (1.626 m)  Wt 333 lb (151.048 kg)  BMI 57.16 kg/m2  LMP 08/13/2011       Fhts category 1, baseline 145bpm      uc's 1:2 - 3 mins moderate.      abd soft between uc's      Cx: 4-5 /80%/-2 Vx, mid position, Soft A: Progressing in labor s/p Foley Bulb and AROM P: Continue care       Pitocin at 6 milliunits/min  Earl Gala, CNM.

## 2012-05-13 NOTE — Progress Notes (Signed)
  Subjective: Patient commenced IOL at [redacted]w[redacted]d with Cytotec. Has had Cytotec 2 doses and last dose at 02.30hrs 05/12/12. Retrieved Cardiac History from [facility] ED visits. Had attended [facility] in March 2013 for Chest Pain. Had normal EKG. Was to f/u with Halter Monitor and ECHO and did not attend. No further information available.    Objective: BP 157/97  Pulse 87  Temp 98.3 F (36.8 C) (Oral)  Resp 20  Ht 5\' 4"  (1.626 m)  Wt 333 lb (151.048 kg)  BMI 57.16 kg/m2  LMP 08/13/2011 I/O last 3 completed shifts: In: 1062.5 [I.V.:1062.5] Out: 600 [Urine:600] Total I/O In: -  Out: 350 [Urine:350]  FHT:  FHR: 145 bpm, variability: moderate,  accelerations:  Abscent,  decelerations:  Absent UC:   irregular, every 1: 5 minutes SVE:   Dilation: 2 Effacement (%): 50 Station: -3 Exam by:: a. white rn  Labs: Lab Results  Component Value Date   WBC 7.7 05/12/2012   HGB 11.6* 05/12/2012   HCT 35.6* 05/12/2012   MCV 80.4 05/12/2012   PLT 249 05/12/2012    Assessment / Plan: HbA1c  Blood Glucose Monitoring q 2 hrly in labor.  Consult with Dr Pennie Rushing for Management Plan. Anesthesia has been consulted with hx. Possible Foley Bulb and Pitocin.   Ad Guttman, CNM. 05/13/2012, 9:07 AM

## 2012-05-13 NOTE — Anesthesia Preprocedure Evaluation (Addendum)
Anesthesia Evaluation  Patient identified by MRN, date of birth, ID band Patient awake    Reviewed: Allergy & Precautions, H&P , Patient's Chart, lab work & pertinent test results  History of Anesthesia Complications (+) DIFFICULT AIRWAY  Airway Mallampati: IV TM Distance: >3 FB Neck ROM: full  Mouth opening: Limited Mouth Opening  Dental  (+) Teeth Intact   Pulmonary  breath sounds clear to auscultation        Cardiovascular hypertension, On Medications Rhythm:regular Rate:Normal     Neuro/Psych    GI/Hepatic   Endo/Other  Morbid obesity  Renal/GU      Musculoskeletal   Abdominal   Peds  Hematology   Anesthesia Other Findings Nl LV fxn on cath Elevated BS of 190's; suspected  DM Morbid Obesity Leaky Valve; no f/u on Echo per Chart. No Murmur noted,      Reproductive/Obstetrics (+) Pregnancy                          Anesthesia Physical Anesthesia Plan  ASA: III  Anesthesia Plan: Epidural   Post-op Pain Management:    Induction:   Airway Management Planned:   Additional Equipment:   Intra-op Plan:   Post-operative Plan:   Informed Consent: I have reviewed the patients History and Physical, chart, labs and discussed the procedure including the risks, benefits and alternatives for the proposed anesthesia with the patient or authorized representative who has indicated his/her understanding and acceptance.   Dental Advisory Given  Plan Discussed with:   Anesthesia Plan Comments: (Labs checked- platelets confirmed with RN in room. Fetal heart tracing, per RN, reported to be stable enough for sitting procedure. Discussed epidural, and patient consents to the procedure:  included risk of possible headache,backache, failed block, allergic reaction, and nerve injury. This patient was asked if she had any questions or concerns before the procedure started. )        Anesthesia  Quick Evaluation

## 2012-05-13 NOTE — Progress Notes (Signed)
Patient ID: Autumn [name], female   DOB: 08-27-1986, 26 y.o.   MRN: [id number] .Subjective:  Has been resting, rcv'd 2nd dose cytotec PV at 0230   Objective: BP 147/70  Pulse 93  Temp 98 F (36.7 C) (Oral)  Resp 20  Ht 5\' 4"  (1.626 m)  Wt 333 lb (151.048 kg)  BMI 57.16 kg/m2  LMP 08/13/2011   FHT:  FHR: 140 bpm, variability: moderate,  accelerations:  Present,  decelerations:  Absent UC:   Irreg, UI SVE:   Dilation: 2 Effacement (%): 50 Station: -3 Exam by:: a. white rn    Assessment / Plan: IOL for CHTN  Will reevaluate for pitocin augmentation after 630   Fetal Wellbeing:  Category I Pain Control:  Labor support without medications  Update physician PRN  Malissa Hippo 05/13/2012, 5:29 AM

## 2012-05-13 NOTE — Progress Notes (Signed)
Pt states that she is unable to stay in the correct position for epidural placement. Patient requests that anesthesia come back at a later time.

## 2012-05-13 NOTE — Progress Notes (Signed)
Comfortable, no pressure. O VSS      fhts category 1, 145 bpm baseline      abd soft between uc      Contractions       Vag  Cx 2/60/-4 A   Augmentation. P Consultation with Dr Pennie Rushing. To place Foley Bulb and Pitocin to augment.     Foley Bulb placed to traction - taped to leg.  Earl Gala, CNM.

## 2012-05-13 NOTE — Progress Notes (Addendum)
Patient ID: ERICIA MOXLEY, female   DOB: [birth year], 26 y.o.   MRN: [id number] .Subjective:  Comfortable w epidural now, denies SOB or chest pain, denies HA/N/V/RUQ pain   Objective: BP 136/74  Pulse 91  Temp 98.7 F (37.1 C) (Oral)  Resp 20  Ht 5\' 4"  (1.626 m)  Wt 333 lb (151.048 kg)  BMI 57.16 kg/m2  SpO2 100%  LMP 08/13/2011  CBG (last 3)   Basename 05/13/12 2144 05/13/12 1923 05/13/12 1726  GLUCAP 118* 114* 92    Filed Vitals:   05/13/12 2125 05/13/12 2135 05/13/12 2150 05/13/12 2205  BP: 122/71 135/65 145/75 143/65  Pulse: 88 93 94 95  Temp:   98 F (36.7 C)   TempSrc:   Oral   Resp: 22 22 20 20   Height:      Weight:      SpO2:         FHT:  FHR: 150 bpm, variability: moderate,  accelerations:  Present,  decelerations:  Present occ variable FHR not tracing well, due to maternal habitus  UC:   regular, every 3-4 minutes SVE:   7/100/-1  FSE and IUPC placed without difficulty   Pitocin at 3mu 02 2L by Hickman   Assessment / Plan: IOL secondary to HTN PIH labs were normal,  ?undiagnosed GDM, HgbA1C=6.0 CBG's are stable  GBS neg Minimal cervical change, ctx not adequate   Will titrate pitocin for adequate labor Pt rcv'd 1 dose IV labetalol, will CTO closely, BP now improved, will hold PO labetalol for now  Will place foley to restart 24hour urine     Fetal Wellbeing:  Category II Pain Control:  Epidural  Dr Pennie Rushing updated   Malissa Hippo 05/13/2012, 9:18 PM

## 2012-05-13 NOTE — Progress Notes (Signed)
Patient had desired Epidural. Dr Jean Rosenthal had attempted to place epidural twice. Patient had broken sterile field  X 2 became breathless from position need for placement of epidural. O: talked to patient and clarified that the breathlessness was related to position and that she was not having chest pain as she the patient had told Dr Jean Rosenthal that she had "Chest Pain". Offered supplemental oxygen via mask and used it temporarily when sitting at bedside.  Patient decided that she would use IV Medications for pain management at present. SVE: 6-7/80%/-2, Vx. FHT baseline 145 bpm EFM "[place]"  And tracing well. UC's harder to trace but will consider IUPC to assist with this problem as needed.   Abd soft between uc  Contractions 1: 3 - 4 mins         A: progressing in labor augmentation P continue care  Earl Gala, CNM

## 2012-05-14 ENCOUNTER — Encounter (HOSPITAL_COMMUNITY): Payer: Self-pay | Admitting: Obstetrics and Gynecology

## 2012-05-14 ENCOUNTER — Encounter (HOSPITAL_COMMUNITY): Payer: Self-pay | Admitting: Anesthesiology

## 2012-05-14 ENCOUNTER — Encounter (HOSPITAL_COMMUNITY): Payer: Self-pay | Admitting: *Deleted

## 2012-05-14 ENCOUNTER — Encounter (HOSPITAL_COMMUNITY)
Admission: AD | Disposition: A | Discharge status: Home / Self Care | Payer: Self-pay | Source: Ambulatory Visit | Attending: Obstetrics and Gynecology

## 2012-05-14 DIAGNOSIS — Z98891 History of uterine scar from previous surgery: Secondary | ICD-10-CM

## 2012-05-14 DIAGNOSIS — O3660X0 Maternal care for excessive fetal growth, unspecified trimester, not applicable or unspecified: Secondary | ICD-10-CM

## 2012-05-14 DIAGNOSIS — O1002 Pre-existing essential hypertension complicating childbirth: Secondary | ICD-10-CM

## 2012-05-14 LAB — GLUCOSE, CAPILLARY
Glucose-Capillary: 120 mg/dL — ABNORMAL HIGH (ref 70–99)
Glucose-Capillary: 111 mg/dL — ABNORMAL HIGH (ref 70–99)
Glucose-Capillary: 143 mg/dL — ABNORMAL HIGH (ref 70–99)

## 2012-05-14 LAB — CBC
HCT: 31.3 % — ABNORMAL LOW (ref 36.0–46.0)
Hemoglobin: 10.2 g/dL — ABNORMAL LOW (ref 12.0–15.0)
WBC: 22.8 10*3/uL — ABNORMAL HIGH (ref 4.0–10.5)

## 2012-05-14 SURGERY — Surgical Case
Anesthesia: Spinal | Site: Abdomen | Wound class: Clean Contaminated

## 2012-05-14 MED ORDER — IBUPROFEN 600 MG PO TABS
600.0000 mg | ORAL_TABLET | Freq: Four times a day (QID) | ORAL | Status: DC
  Administered 2012-05-14 – 2012-05-17 (×12): 600 mg via ORAL
  Filled 2012-05-14 (×12): qty 1

## 2012-05-14 MED ORDER — ONDANSETRON HCL 4 MG PO TABS: 4.0000 mg | ORAL_TABLET | ORAL | Status: DC | PRN

## 2012-05-14 MED ORDER — OXYTOCIN 10 UNIT/ML IJ SOLN
INTRAMUSCULAR | Status: AC
  Filled 2012-05-14: qty 4

## 2012-05-14 MED ORDER — FENTANYL CITRATE 0.05 MG/ML IJ SOLN
INTRAMUSCULAR | Status: AC
  Administered 2012-05-14: 50 ug via INTRAVENOUS
  Filled 2012-05-14: qty 2

## 2012-05-14 MED ORDER — LACTATED RINGERS IV SOLN
INTRAVENOUS | Status: DC | PRN
  Administered 2012-05-14: 06:00:00 via INTRAVENOUS

## 2012-05-14 MED ORDER — MORPHINE SULFATE (PF) 0.5 MG/ML IJ SOLN
INTRAMUSCULAR | Status: DC | PRN
  Administered 2012-05-14: 4 mg via EPIDURAL

## 2012-05-14 MED ORDER — OXYCODONE-ACETAMINOPHEN 5-325 MG PO TABS
1.0000 | ORAL_TABLET | ORAL | Status: DC | PRN
  Administered 2012-05-14 – 2012-05-15 (×7): 1 via ORAL
  Administered 2012-05-16 (×3): 2 via ORAL
  Administered 2012-05-16 (×2): 1 via ORAL
  Administered 2012-05-17: 2 via ORAL
  Administered 2012-05-17: 1 via ORAL
  Filled 2012-05-14: qty 1
  Filled 2012-05-14: qty 2
  Filled 2012-05-14 (×3): qty 1
  Filled 2012-05-14 (×3): qty 2
  Filled 2012-05-14 (×2): qty 1
  Filled 2012-05-14: qty 2
  Filled 2012-05-14 (×3): qty 1

## 2012-05-14 MED ORDER — LANOLIN HYDROUS EX OINT: 1.0000 "application " | TOPICAL_OINTMENT | CUTANEOUS | Status: DC | PRN

## 2012-05-14 MED ORDER — METOCLOPRAMIDE HCL 5 MG/ML IJ SOLN: 10.0000 mg | Freq: Three times a day (TID) | INTRAMUSCULAR | Status: DC | PRN

## 2012-05-14 MED ORDER — ZOLPIDEM TARTRATE 5 MG PO TABS: 5.0000 mg | ORAL_TABLET | Freq: Every evening | ORAL | Status: DC | PRN

## 2012-05-14 MED ORDER — KETOROLAC TROMETHAMINE 60 MG/2ML IM SOLN
INTRAMUSCULAR | Status: AC
  Administered 2012-05-14: 60 mg via INTRAMUSCULAR
  Filled 2012-05-14: qty 2

## 2012-05-14 MED ORDER — SODIUM CHLORIDE 0.9 % IJ SOLN: 3.0000 mL | INTRAMUSCULAR | Status: DC | PRN

## 2012-05-14 MED ORDER — KETOROLAC TROMETHAMINE 30 MG/ML IJ SOLN: 30.0000 mg | Freq: Four times a day (QID) | INTRAMUSCULAR | Status: AC | PRN

## 2012-05-14 MED ORDER — FENTANYL CITRATE 0.05 MG/ML IJ SOLN
INTRAMUSCULAR | Status: AC
  Filled 2012-05-14: qty 2

## 2012-05-14 MED ORDER — MEPERIDINE HCL 25 MG/ML IJ SOLN: 6.2500 mg | INTRAMUSCULAR | Status: DC | PRN

## 2012-05-14 MED ORDER — LACTATED RINGERS IV SOLN: INTRAVENOUS | Status: DC | PRN

## 2012-05-14 MED ORDER — WITCH HAZEL-GLYCERIN EX PADS: 1.0000 "application " | MEDICATED_PAD | CUTANEOUS | Status: DC | PRN

## 2012-05-14 MED ORDER — PRENATAL MULTIVITAMIN CH
1.0000 | ORAL_TABLET | Freq: Every day | ORAL | Status: DC
  Administered 2012-05-14 – 2012-05-17 (×4): 1 via ORAL
  Filled 2012-05-14 (×4): qty 1

## 2012-05-14 MED ORDER — ONDANSETRON HCL 4 MG/2ML IJ SOLN: 4.0000 mg | INTRAMUSCULAR | Status: DC | PRN

## 2012-05-14 MED ORDER — MORPHINE SULFATE (PF) 0.5 MG/ML IJ SOLN
INTRAMUSCULAR | Status: DC | PRN
  Administered 2012-05-14: 1 mg via INTRAVENOUS

## 2012-05-14 MED ORDER — BUPIVACAINE HCL (PF) 0.25 % IJ SOLN
INTRAMUSCULAR | Status: DC | PRN
  Administered 2012-05-14: 10 mL

## 2012-05-14 MED ORDER — 0.9 % SODIUM CHLORIDE (POUR BTL) OPTIME
TOPICAL | Status: DC | PRN
  Administered 2012-05-14: 1000 mL

## 2012-05-14 MED ORDER — LACTATED RINGERS IV SOLN: INTRAVENOUS | Status: DC

## 2012-05-14 MED ORDER — ONDANSETRON HCL 4 MG/2ML IJ SOLN
INTRAMUSCULAR | Status: AC
  Filled 2012-05-14: qty 2

## 2012-05-14 MED ORDER — MORPHINE SULFATE 0.5 MG/ML IJ SOLN
INTRAMUSCULAR | Status: AC
  Filled 2012-05-14: qty 10

## 2012-05-14 MED ORDER — OXYTOCIN 10 UNIT/ML IJ SOLN
40.0000 [IU] | INTRAVENOUS | Status: DC | PRN
  Administered 2012-05-14: 40 [IU] via INTRAVENOUS

## 2012-05-14 MED ORDER — KETOROLAC TROMETHAMINE 60 MG/2ML IM SOLN
60.0000 mg | Freq: Once | INTRAMUSCULAR | Status: AC | PRN
  Administered 2012-05-14: 60 mg via INTRAMUSCULAR
  Filled 2012-05-14: qty 2

## 2012-05-14 MED ORDER — LACTATED RINGERS IV SOLN
INTRAVENOUS | Status: DC | PRN
  Administered 2012-05-14 (×2): via INTRAVENOUS

## 2012-05-14 MED ORDER — LACTATED RINGERS IV SOLN
Freq: Once | INTRAVENOUS | Status: AC
  Administered 2012-05-14: 02:00:00 via INTRAUTERINE

## 2012-05-14 MED ORDER — OXYTOCIN 40 UNITS IN LACTATED RINGERS INFUSION - SIMPLE MED: 62.5000 mL/h | INTRAVENOUS | Status: AC

## 2012-05-14 MED ORDER — PROMETHAZINE HCL 25 MG/ML IJ SOLN: 6.2500 mg | INTRAMUSCULAR | Status: DC | PRN

## 2012-05-14 MED ORDER — FENTANYL CITRATE 0.05 MG/ML IJ SOLN
INTRAMUSCULAR | Status: DC | PRN
  Administered 2012-05-14 (×4): 50 ug via INTRAVENOUS

## 2012-05-14 MED ORDER — NALBUPHINE HCL 10 MG/ML IJ SOLN
5.0000 mg | INTRAMUSCULAR | Status: DC | PRN
  Filled 2012-05-14: qty 1

## 2012-05-14 MED ORDER — BUPIVACAINE HCL (PF) 0.25 % IJ SOLN
INTRAMUSCULAR | Status: AC
  Filled 2012-05-14: qty 30

## 2012-05-14 MED ORDER — DIBUCAINE 1 % RE OINT: 1.0000 "application " | TOPICAL_OINTMENT | RECTAL | Status: DC | PRN

## 2012-05-14 MED ORDER — MEDROXYPROGESTERONE ACETATE 150 MG/ML IM SUSP: 150.0000 mg | INTRAMUSCULAR | Status: DC | PRN

## 2012-05-14 MED ORDER — MENTHOL 3 MG MT LOZG: 1.0000 | LOZENGE | OROMUCOSAL | Status: DC | PRN

## 2012-05-14 MED ORDER — TETANUS-DIPHTH-ACELL PERTUSSIS 5-2.5-18.5 LF-MCG/0.5 IM SUSP
0.5000 mL | Freq: Once | INTRAMUSCULAR | Status: AC
  Administered 2012-05-15: 0.5 mL via INTRAMUSCULAR
  Filled 2012-05-14: qty 0.5

## 2012-05-14 MED ORDER — SODIUM BICARBONATE 8.4 % IV SOLN
INTRAVENOUS | Status: DC | PRN
  Administered 2012-05-14: 5 mL via EPIDURAL

## 2012-05-14 MED ORDER — SODIUM BICARBONATE 8.4 % IV SOLN
INTRAVENOUS | Status: AC
  Filled 2012-05-14: qty 50

## 2012-05-14 MED ORDER — HYDROMORPHONE HCL PF 1 MG/ML IJ SOLN
INTRAMUSCULAR | Status: AC
  Filled 2012-05-14: qty 1

## 2012-05-14 MED ORDER — CEFAZOLIN SODIUM-DEXTROSE 2-3 GM-% IV SOLR: 2.0000 g | Freq: Once | INTRAVENOUS | Status: DC

## 2012-05-14 MED ORDER — SIMETHICONE 80 MG PO CHEW
80.0000 mg | CHEWABLE_TABLET | Freq: Three times a day (TID) | ORAL | Status: DC
  Administered 2012-05-14 – 2012-05-17 (×12): 80 mg via ORAL

## 2012-05-14 MED ORDER — SODIUM CHLORIDE 0.9 % IV SOLN
1.0000 ug/kg/h | INTRAVENOUS | Status: DC | PRN
  Filled 2012-05-14: qty 2.5

## 2012-05-14 MED ORDER — HYDROMORPHONE HCL PF 1 MG/ML IJ SOLN: 0.2500 mg | INTRAMUSCULAR | Status: DC | PRN

## 2012-05-14 MED ORDER — SENNOSIDES-DOCUSATE SODIUM 8.6-50 MG PO TABS
2.0000 | ORAL_TABLET | Freq: Every day | ORAL | Status: DC
  Administered 2012-05-14 – 2012-05-16 (×3): 2 via ORAL

## 2012-05-14 MED ORDER — NALOXONE HCL 0.4 MG/ML IJ SOLN: 0.4000 mg | INTRAMUSCULAR | Status: DC | PRN

## 2012-05-14 MED ORDER — DIPHENHYDRAMINE HCL 25 MG PO CAPS: 25.0000 mg | ORAL_CAPSULE | Freq: Four times a day (QID) | ORAL | Status: DC | PRN

## 2012-05-14 MED ORDER — DIPHENHYDRAMINE HCL 50 MG/ML IJ SOLN: 12.5000 mg | INTRAMUSCULAR | Status: DC | PRN

## 2012-05-14 MED ORDER — SCOPOLAMINE 1 MG/3DAYS TD PT72
1.0000 | MEDICATED_PATCH | Freq: Once | TRANSDERMAL | Status: AC
  Administered 2012-05-14: 1.5 mg via TRANSDERMAL
  Filled 2012-05-14: qty 1

## 2012-05-14 MED ORDER — ONDANSETRON HCL 4 MG/2ML IJ SOLN
INTRAMUSCULAR | Status: DC | PRN
  Administered 2012-05-14: 4 mg via INTRAVENOUS

## 2012-05-14 MED ORDER — SIMETHICONE 80 MG PO CHEW: 80.0000 mg | CHEWABLE_TABLET | ORAL | Status: DC | PRN

## 2012-05-14 MED ORDER — LIDOCAINE-EPINEPHRINE (PF) 2 %-1:200000 IJ SOLN
INTRAMUSCULAR | Status: AC
  Filled 2012-05-14: qty 20

## 2012-05-14 MED ORDER — DIPHENHYDRAMINE HCL 25 MG PO CAPS
25.0000 mg | ORAL_CAPSULE | ORAL | Status: DC | PRN
  Administered 2012-05-14 (×2): 25 mg via ORAL
  Filled 2012-05-14 (×2): qty 1

## 2012-05-14 MED ORDER — ONDANSETRON HCL 4 MG/2ML IJ SOLN: 4.0000 mg | Freq: Three times a day (TID) | INTRAMUSCULAR | Status: DC | PRN

## 2012-05-14 MED ORDER — FENTANYL CITRATE 0.05 MG/ML IJ SOLN
25.0000 ug | INTRAMUSCULAR | Status: DC | PRN
  Administered 2012-05-14 (×2): 50 ug via INTRAVENOUS

## 2012-05-14 MED ORDER — SCOPOLAMINE 1 MG/3DAYS TD PT72
MEDICATED_PATCH | TRANSDERMAL | Status: AC
  Administered 2012-05-14: 1.5 mg via TRANSDERMAL
  Filled 2012-05-14: qty 1

## 2012-05-14 MED ORDER — CEFAZOLIN SODIUM 10 G IJ SOLR
3.0000 g | INTRAMUSCULAR | Status: AC
  Administered 2012-05-14: 3 g via INTRAVENOUS
  Filled 2012-05-14: qty 3000

## 2012-05-14 MED ORDER — KETOROLAC TROMETHAMINE 30 MG/ML IJ SOLN: 15.0000 mg | Freq: Once | INTRAMUSCULAR | Status: DC | PRN

## 2012-05-14 MED ORDER — DIPHENHYDRAMINE HCL 50 MG/ML IJ SOLN: 25.0000 mg | INTRAMUSCULAR | Status: DC | PRN

## 2012-05-14 SURGICAL SUPPLY — 47 items
BENZOIN TINCTURE PRP APPL 2/3 (GAUZE/BANDAGES/DRESSINGS) ×2 IMPLANT
BLADE EXTENDED COATED 6.5IN (ELECTRODE) IMPLANT
BLADE HEX COATED 2.75 (ELECTRODE) IMPLANT
BOOTIES KNEE HIGH SLOAN (MISCELLANEOUS) ×4 IMPLANT
CHLORAPREP W/TINT 26ML (MISCELLANEOUS) ×2 IMPLANT
CLOTH BEACON ORANGE TIMEOUT ST (SAFETY) ×2 IMPLANT
CONTAINER PREFILL 10% NBF 15ML (MISCELLANEOUS) ×4 IMPLANT
DERMABOND ADVANCED (GAUZE/BANDAGES/DRESSINGS)
DERMABOND ADVANCED .7 DNX12 (GAUZE/BANDAGES/DRESSINGS) IMPLANT
DRAIN JACKSON PRT FLT 7MM (DRAIN) ×2 IMPLANT
DRESSING TELFA 8X3 (GAUZE/BANDAGES/DRESSINGS) ×4 IMPLANT
DRSG COVADERM 4X10 (GAUZE/BANDAGES/DRESSINGS) ×2 IMPLANT
ELECT REM PT RETURN 9FT ADLT (ELECTROSURGICAL) ×2
ELECTRODE REM PT RTRN 9FT ADLT (ELECTROSURGICAL) ×1 IMPLANT
EVACUATOR SILICONE 100CC (DRAIN) ×2 IMPLANT
EXTRACTOR VACUUM KIWI (MISCELLANEOUS) IMPLANT
EXTRACTOR VACUUM M CUP 4 TUBE (SUCTIONS) IMPLANT
GAUZE SPONGE 4X4 12PLY STRL LF (GAUZE/BANDAGES/DRESSINGS) ×2 IMPLANT
GLOVE SURG SS PI 6.5 STRL IVOR (GLOVE) ×4 IMPLANT
GOWN PREVENTION PLUS LG XLONG (DISPOSABLE) ×6 IMPLANT
KIT ABG SYR 3ML LUER SLIP (SYRINGE) ×2 IMPLANT
NEEDLE HYPO 25X5/8 SAFETYGLIDE (NEEDLE) ×2 IMPLANT
NEEDLE SPNL 22GX3.5 QUINCKE BK (NEEDLE) ×2 IMPLANT
NS IRRIG 1000ML POUR BTL (IV SOLUTION) ×2 IMPLANT
PACK C SECTION WH (CUSTOM PROCEDURE TRAY) ×2 IMPLANT
PAD ABD 7.5X8 STRL (GAUZE/BANDAGES/DRESSINGS) ×2 IMPLANT
PAD OB MATERNITY 4.3X12.25 (PERSONAL CARE ITEMS) ×2 IMPLANT
RTRCTR C-SECT PINK 25CM LRG (MISCELLANEOUS) ×2 IMPLANT
SLEEVE SCD COMPRESS KNEE MED (MISCELLANEOUS) ×2 IMPLANT
STRIP CLOSURE SKIN 1/4X4 (GAUZE/BANDAGES/DRESSINGS) IMPLANT
SUT CHROMIC 2 0 SH (SUTURE) ×2 IMPLANT
SUT MNCRL AB 3-0 PS2 27 (SUTURE) ×2 IMPLANT
SUT SILK 0 FSL (SUTURE) ×2 IMPLANT
SUT VIC AB 0 CT1 27 (SUTURE) ×2
SUT VIC AB 0 CT1 27XBRD ANBCTR (SUTURE) ×2 IMPLANT
SUT VIC AB 0 CT1 36 (SUTURE) ×6 IMPLANT
SUT VIC AB 0 CTXB 36 (SUTURE) IMPLANT
SUT VIC AB 2-0 CT1 27 (SUTURE) ×2
SUT VIC AB 2-0 CT1 TAPERPNT 27 (SUTURE) ×2 IMPLANT
SUT VIC AB 2-0 SH 27 (SUTURE)
SUT VIC AB 2-0 SH 27XBRD (SUTURE) IMPLANT
SWAB CULTURE LIQ STUART DBL (MISCELLANEOUS) ×2 IMPLANT
SYR CONTROL 10ML LL (SYRINGE) ×2 IMPLANT
TOWEL OR 17X24 6PK STRL BLUE (TOWEL DISPOSABLE) ×4 IMPLANT
TRAY FOLEY CATH 14FR (SET/KITS/TRAYS/PACK) ×2 IMPLANT
TUBE ANAEROBIC SPECIMEN COL (MISCELLANEOUS) ×2 IMPLANT
WATER STERILE IRR 1000ML POUR (IV SOLUTION) ×2 IMPLANT

## 2012-05-14 NOTE — Transfer of Care (Signed)
Immediate Anesthesia Transfer of Care Note  Patient: Autumn [name]  Procedure(s) Performed: Procedure(s) (LRB): CESAREAN SECTION (N/A)  Patient Location: PACU  Anesthesia Type: Epidural  Level of Consciousness: awake, alert  and oriented  Airway & Oxygen Therapy: Patient Spontanous Breathing  Post-op Assessment: Report given to PACU RN and Post -op Vital signs reviewed and stable  Post vital signs: Reviewed and stable  Complications: No apparent anesthesia complications

## 2012-05-14 NOTE — Progress Notes (Signed)
Patient ID: Autumn [name], female   DOB: July 22, 1986, 26 y.o.   MRN: [id number] .Subjective: Resting comfortable w epidural    Objective: BP 138/66  Pulse 104  Temp 99.1 F (37.3 C) (Oral)  Resp 20  Ht 5\' 4"  (1.626 m)  Wt 333 lb (151.048 kg)  BMI 57.16 kg/m2  SpO2 100%  LMP 08/13/2011 Filed Vitals:   05/14/12 0035 05/14/12 0102 05/14/12 0141 05/14/12 0202  BP: 154/85 141/64 140/76 138/66  Pulse: 97 96 101 104  Temp:   99.1 F (37.3 C)   TempSrc:   Oral   Resp: 22 20 18 20   Height:      Weight:      SpO2:       CBG (last 3)   Basename 05/14/12 0200 05/13/12 2333 05/13/12 2144  GLUCAP 120* 126* 118*      FHT:  FHR: 150 bpm, variability: moderate,  accelerations:  Present,  decelerations:  Present mild variables UC:   regular, every 2-3 minutes SVE:   Dilation: 8 Effacement (%): 100 Station: 0 Exam by:: B.cagna,RN  MVU's 210-230 for about an hour Pitocin at 17mu  Assessment / Plan: transition CHTN ? GDM  BP stable CBG stable  Will CTO,  Recheck in about an hour   Fetal Wellbeing:  Category I Pain Control:  Epidural  Update physician PRN  Malissa Hippo 05/14/2012, 2:39 AM

## 2012-05-14 NOTE — Interval H&P Note (Signed)
History and Physical Interval Note:  05/14/2012 4:24 AM  Autumn [name]  has presented today for surgery, with the diagnosis of  Arrest of the active phase of labor after more than 2 hours of adequate contractions and failure to advance beyond 8 cm dilation.  The various methods of treatment have been discussed with the patient and family including continuing to give pitocin augmentation. After consideration of risks, benefits and other options for treatment, the patient has consented to  The recommended cesarean section as a surgical intervention . The indications as well as the known risks have been reviewed as they were with S. Lillard CNM.  Additional unknown risks are acknowledged. The patient's history has been reviewed, patient examined, no change in status, stable for surgery.  I have reviewed the patient's chart and labs.  Questions were answered to the patient's satisfaction.  We will proceed.   HAYGOOD,VANESSA P

## 2012-05-14 NOTE — Anesthesia Postprocedure Evaluation (Signed)
Anesthesia Post Note  Patient: Autumn [name]  Procedure(s) Performed: Procedure(s) (LRB): CESAREAN SECTION (N/A)  Anesthesia type: Epidural  Patient location: Mother/Baby  Post pain: Pain level controlled  Post assessment: Post-op Vital signs reviewed  Last Vitals:  Filed Vitals:   05/14/12 1034  BP:   Pulse:   Temp:   Resp: 20    Post vital signs: Reviewed  Level of consciousness: awake  Complications: No apparent anesthesia complications

## 2012-05-14 NOTE — Progress Notes (Signed)
Patient ID: Autumn [name], female   DOB: [birth year], 26 y.o.   MRN: [id number] .Subjective:  Sleeping, comfortable w epidural   Objective: BP 155/62  Pulse 102  Temp 98.6 F (37 C) (Oral)  Resp 20  Ht 5\' 4"  (1.626 m)  Wt 333 lb (151.048 kg)  BMI 57.16 kg/m2  SpO2 100%  LMP 08/13/2011  Filed Vitals:   05/13/12 2232 05/13/12 2302 05/13/12 2335 05/14/12 0005  BP: 127/84 137/72 136/70 155/62  Pulse: 91 92 95 102  Temp:   98.6 F (37 C)   TempSrc:   Oral   Resp: 20 20 20 20   Height:      Weight:      SpO2:       CBG (last 3)   Basename 05/13/12 2333 05/13/12 2144 05/13/12 1923  GLUCAP 126* 118* 114*      FHT:  FHR: 140 bpm, variability: moderate,  accelerations:  Present,  decelerations:  Present occ mild variable UC:   irregular, every 2-3 minutes SVE:   8/100/0  MVU's 170-210      Assessment / Plan: IOL for CHTN  BP stable CBG's stable 24hour urine in progress Will check accurate I/O  Continue titrate pitocin for adequacy   Fetal Wellbeing:  Category I Pain Control:  Epidural  Update physician PRN  Ibrahem Volkman M 05/14/2012, 12:11 AM

## 2012-05-14 NOTE — Op Note (Signed)
Cesarean Section Procedure Note  Indications: failure to progress: arrest of dilation  Pre-operative Diagnosis: 39 week 2 day pregnancy.     Chronic hypertension     Morbid obesity     Arrest of dilatation in the active phase of labor  Post-operative Diagnosis: same     Possible fetal macrosomia  Surgeon: Hal Morales  First Assistant:  Surgeon: Hal Morales   Assistants:Shelly Lillard, certified nurse midwife  Anesthesia: Epidural anesthesia  ASA Class: 3E  Procedure Details  The patient was seen in the Holding Room. The risks, benefits, complications, treatment options, and expected outcomes were discussed with the patient.  The patient concurred with the proposed plan, giving informed consent.  The site of surgery properly noted/marked. The patient was taken to Operating Room # 1, identified as Autumn [name] and the procedure verified as C-Section Delivery. A Time Out was held and the above information confirmed.  After assurance of surgical anesthesia, the patient was  prepped withChloraPrep in the usual sterile manner.A foley catheter was in place from the labor and delivery suite.  The patient was then draped in the usual fashion.   Suprapubicsubcutaneous injection of 0.25% Bupivacaine   A Pfannenstiel incision was made and carried down through the subcutaneous tissue to the fascia. Fascial incision was made and extended transversely. The fascia was separated from the underlying rectus tissue superiorly and inferiorly. The peritoneum was identified and entered. Peritoneal incision was extended longitudinally.  The Alexis retractor was placed.   A low transverse uterine incision was made two cm above the uterovesical fold, and that incision extended transversely bluntly.The infant was  delivered from occiput anterior presentation was a living female whose name is Steward Drone, with Apgar scores of 8 at one minute and 9 at five minutes. After the umbilical cord was clamped and  cut cord blood was obtained for evaluation. The placenta was removed intact and appeared normal. The uterine outline, tubes and ovaries appeared norma for the gravid state. The uterine incision was closed with running locked sutures of 0 Vicryl. An imbricating layer of sutures was placed. Hemostasis was observed. Lavage was carried out until clear. The peritoneum was closed with a running suture of 2-0 Vicryl.  The rectus muscles were reapproximated with a figure of 8 suture of 2-0 Vicryl.  The fascia was then reapproximated with a running sutures of 0 Vicryl .Renforcing figure of 8 sutures of 0Vicryl were placed on either side of midline.  A 7 mm Jackson-Pratt drain was placed through a stab wound in the left suprapubic region.  The skin was reapproximated with 3-0 moncryl.  A sterile dressing was applied after the grenade was attached to the Jackson-Pratt drain  Instrument, sponge, and needle counts were correct prior to the abdominal closure and at the conclusion of the case.   Findings:  Placenta contained a 3vessel cord    Estimated Blood Loss:  1000 cc         Drains: Jackson-Pratt in the subcutaneous space         Total IV Fluids:         Specimens: placenta         Implants: none         Complications: ::"None; patient tolerated the procedure well."         Disposition: PACU - hemodynamically stable.         Condition: stable  Attending Attestation: I performed the procedure.  Dorena Dorfman P  05/14/2012 6:49 AM

## 2012-05-14 NOTE — H&P (View-Only) (Signed)
Patient ID: Autumn [name], female   DOB: [birth year], 26 y.o.   MRN: [id number] .Subjective:  Feels some pressure  Objective: BP 130/58  Pulse 92  Temp 99.1 F (37.3 C) (Oral)  Resp 20  Ht 5\' 4"  (1.626 [name])  Wt 333 lb (151.048 kg)  BMI 57.16 kg/m2  SpO2 100%  LMP 08/13/2011   FHT:  FHR: 160 bpm, variability: moderate,  accelerations:  Present,  decelerations:  Present variables and late decels  UC:   regular, every 2-3 minutes SVE:   Dilation: 8 Effacement (%): 100 Station: 0 Exam by:: S.Irl Bodie,CNM  Pitocin off MVU's 230  Assessment / Plan: Arrest in active phase of labor GBS neg BP stable Temp =99.6, rcv'd PO tylenol     Fetal Wellbeing:  Category II Pain Control:  Epidural  Notified dr Pennie Rushing, will prepare for c/s  Rv'd Risks including but not limited to: 1. Bleeding, 2. Infection, 3. Damage to internal organs and 4. Anesthesia complications Pt agrees to proceed   Autumn [name] 05/14/2012, 3:41 AM

## 2012-05-14 NOTE — Progress Notes (Addendum)
Patient ID: Autumn [name], female   DOB: 06/04/1986, 26 y.o.   MRN: [id number] .Subjective:  Feels some pressure  Objective: BP 130/58  Pulse 92  Temp 99.1 F (37.3 C) (Oral)  Resp 20  Ht 5' 4" (1.626 m)  Wt 333 lb (151.048 kg)  BMI 57.16 kg/m2  SpO2 100%  LMP 08/13/2011   FHT:  FHR: 160 bpm, variability: moderate,  accelerations:  Present,  decelerations:  Present variables and late decels  UC:   regular, every 2-3 minutes SVE:   Dilation: 8 Effacement (%): 100 Station: 0 Exam by:: S.Wells Gerdeman,CNM  Pitocin off MVU's 230  Assessment / Plan: Arrest in active phase of labor GBS neg BP stable Temp =99.6, rcv'd PO tylenol     Fetal Wellbeing:  Category II Pain Control:  Epidural  Notified dr Haygood, will prepare for c/s  Rv'd Risks including but not limited to: 1. Bleeding, 2. Infection, 3. Damage to internal organs and 4. Anesthesia complications Pt agrees to proceed   Autumn [name] M 05/14/2012, 3:41 AM      

## 2012-05-14 NOTE — Anesthesia Postprocedure Evaluation (Signed)
Anesthesia Post Note  Patient: Autumn [name]  Procedure(s) Performed: Procedure(s) (LRB): CESAREAN SECTION (N/A)  Anesthesia type: Epidural  Patient location: PACU  Post pain: Pain level controlled  Post assessment: Post-op Vital signs reviewed  Post vital signs: stable  Level of consciousness: awake  Complications: No apparent anesthesia complications

## 2012-05-15 ENCOUNTER — Encounter (HOSPITAL_COMMUNITY): Payer: Self-pay | Admitting: Obstetrics and Gynecology

## 2012-05-15 LAB — CBC
MCH: 26.1 pg (ref 26.0–34.0)
MCV: 82.2 fL (ref 78.0–100.0)
Platelets: 218 10*3/uL (ref 150–400)
RBC: 3.49 MIL/uL — ABNORMAL LOW (ref 3.87–5.11)
RDW: 14.7 % (ref 11.5–15.5)
WBC: 16.6 10*3/uL — ABNORMAL HIGH (ref 4.0–10.5)

## 2012-05-15 LAB — BASIC METABOLIC PANEL
CO2: 26 mEq/L (ref 19–32)
Calcium: 8.7 mg/dL (ref 8.4–10.5)
Chloride: 103 mEq/L (ref 96–112)
Creatinine, Ser: 0.8 mg/dL (ref 0.50–1.10)
GFR calc Af Amer: >90 mL/min (ref >90)
Sodium: 137 mEq/L (ref 135–145)

## 2012-05-15 LAB — CREATININE CLEARANCE, URINE, 24 HOUR
Collection Interval-CRCL: 24 hours
Creatinine Clearance: 210 mL/min — ABNORMAL HIGH (ref 75–115)
Creatinine, 24H Ur: 2418 mg/d — ABNORMAL HIGH (ref 700–1800)
Creatinine, Urine: 80.61 mg/dL

## 2012-05-15 LAB — GLUCOSE, CAPILLARY
Glucose-Capillary: 170 mg/dL — ABNORMAL HIGH (ref 70–99)
Glucose-Capillary: 86 mg/dL (ref 70–99)
Glucose-Capillary: 89 mg/dL (ref 70–99)

## 2012-05-15 LAB — PROTEIN, URINE, 24 HOUR
Protein, 24H Urine: 240 mg/d — ABNORMAL HIGH (ref 50–100)
Protein, Urine: 8 mg/dL

## 2012-05-15 MED ORDER — FERROUS SULFATE 325 (65 FE) MG PO TABS
325.0000 mg | ORAL_TABLET | Freq: Two times a day (BID) | ORAL | Status: DC
  Administered 2012-05-15 – 2012-05-17 (×4): 325 mg via ORAL
  Filled 2012-05-15 (×3): qty 1
  Filled 2012-05-15: qty 6
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
## 2012-05-15 NOTE — Progress Notes (Signed)
Called Autumn [name] to report CBG of 170. Order to call MD if greater than 160. No new orders given. Asked to wait for dinner CBG.

## 2012-05-15 NOTE — Progress Notes (Signed)
Subjective: Postpartum Day 1: Primary LTCS due to arrest of dilation  Patient reports no problems voiding.  Up ad lib, no syncope or dizziness. Pain medication effective.   Feeding:  Breast and bottlefeeding Contraceptive:  Unsure at present  Objective: Vital signs in last 24 hours: Temp:  [97.8 F (36.6 C)-98.5 F (36.9 C)] 97.8 F (36.6 C) (08/21 0415) Pulse Rate:  [79-98] 92  (08/21 0415) Resp:  [20-22] 20  (08/21 0415) BP: (112-138)/(60-83) 121/76 mmHg (08/21 0415) SpO2:  [97 %-99 %] 99 % (08/21 0415)  Physical Exam:  General: alert Lochia: appropriate Uterine Fundus: firm Incision: Dressing CDI DVT Evaluation: No evidence of DVT seen on physical exam. Negative Homan's sign.  Using SCDs when in bed. JP drain:   30 cc overnight, serosanguinous drainage.   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 Lilian Coma order--MD to be notified if > 160 or < 60. Repeat CBC in am.. Continue current care.  Nigel Bridgeman 05/15/2012, 12:05 PM

## 2012-05-15 NOTE — Progress Notes (Signed)
The pt did not order food until lunch time. Unable to check CBG for breakfast because pt did not eat breakfast.

## 2012-05-15 NOTE — Progress Notes (Signed)
UR chart review completed.  

## 2012-05-16 ENCOUNTER — Encounter (HOSPITAL_COMMUNITY): Payer: Self-pay

## 2012-05-16 LAB — GLUCOSE, CAPILLARY
Glucose-Capillary: 90 mg/dL (ref 70–99)
Glucose-Capillary: 99 mg/dL (ref 70–99)

## 2012-05-16 LAB — CBC
Hemoglobin: 8.8 g/dL — ABNORMAL LOW (ref 12.0–15.0)
MCH: 26.6 pg (ref 26.0–34.0)
MCHC: 32.2 g/dL (ref 30.0–36.0)
Platelets: 222 10*3/uL (ref 150–400)

## 2012-05-16 LAB — WOUND CULTURE
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
## 2012-05-16 NOTE — Progress Notes (Signed)
Subjective: Postpartum Day 2: Cesarean Delivery secondary to: FTP Patient reports tolerating PO, + flatus and no problems voiding.   no complaints, up ad lib without syncope, needs encouragement to ambulate. Pain well controlled with po meds Formula feeding Rush Barer) Mood stable, bonding well Contraception: undecided - to discuss prior to discharge home.  Objective: Vital signs in last 24 hours: Temp:  [97.9 F (36.6 C)-98.7 F (37.1 C)] 97.9 F (36.6 C) (08/22 0627) Pulse Rate:  [69-98] 82  (08/22 1044) Resp:  [18-20] 20  (08/22 1044) BP: (99-142)/(64-82) 142/81 mmHg (08/22 1044) Continues on Labetelol 200mg  po BID.  Fasting Blood Glucose 99 this morning.  Physical Exam:  General: alert and cooperative Breasts: normal Heart: RRR Lungs: CTAB Abdomen: BS x4 Uterine Fundus: firm Incision: healing well, no significant drainage, no significant erythema, 5ml JP drainage and remains in place until am. Lochia: appropriate DVT Evaluation: No evidence of DVT seen on physical exam. Negative Homan's sign. Calf/Ankle edema is present. Postpartum anemia.  Basename 05/16/12 0510 05/15/12 0537  HGB 8.8* 9.1*  HCT 27.3* 28.7*    Assessment/Plan: Status post Cesarean section. Doing well postoperatively.  Continue current care. Will discharge to home in am if condition is stable.  Loney Peto 05/16/2012, 10:48 AM

## 2012-05-17 MED ORDER — HYDROCHLOROTHIAZIDE 25 MG PO TABS: 25.0000 mg | ORAL_TABLET | Freq: Every day | ORAL | Status: DC

## 2012-05-17 MED ORDER — IBUPROFEN 800 MG PO TABS: 800.0000 mg | ORAL_TABLET | Freq: Three times a day (TID) | ORAL | Status: AC | PRN

## 2012-05-17 MED ORDER — POTASSIUM CHLORIDE ER 10 MEQ PO TBCR: 10.0000 meq | EXTENDED_RELEASE_TABLET | ORAL | Status: DC

## 2012-05-17 MED ORDER — OXYCODONE-ACETAMINOPHEN 5-325 MG PO TABS: 1.0000 | ORAL_TABLET | ORAL | Status: AC | PRN

## 2012-05-17 MED ORDER — NORETHINDRONE 0.35 MG PO TABS: 1.0000 | ORAL_TABLET | Freq: Every day | ORAL | Status: DC

## 2012-05-17 MED ORDER — FERROUS SULFATE 325 (65 FE) MG PO TABS: 325.0000 mg | ORAL_TABLET | Freq: Three times a day (TID) | ORAL | Status: DC

## 2012-05-17 NOTE — Progress Notes (Signed)
Subjective: Postpartum Day 3: Cesarean Delivery Patient reports tolerating PO, + BM and no problems voiding.    Objective: Vital signs in last 24 hours: Temp:  [97.4 F (36.3 C)-98.1 F (36.7 C)] 97.4 F (36.3 C) (08/23 0455) Pulse Rate:  [74-82] 82  (08/23 0455) Resp:  [18-20] 20  (08/23 0455) BP: (140-159)/(80-87) 145/85 mmHg (08/23 0455)  Physical Exam:  General: alert and no distress Lochia: appropriate Uterine Fundus: firm Incision: healing well, JP drain removed DVT Evaluation: No evidence of DVT seen on physical exam.   Basename 05/16/12 0510 05/15/12 0537  HGB 8.8* 9.1*  HCT 27.3* 28.7*    Assessment/Plan: Status post Cesarean section. Doing well postoperatively.  Discharge to home. Patient will see her cardiologist next week. Continue labetalol 200 mg twice each day. Call for difficulties. Micronor for contraception. Return to our office in 6 weeks  Christophere Hillhouse V 05/17/2012, 2:19 PM

## 2012-05-19 LAB — ANAEROBIC CULTURE

## 2012-05-21 NOTE — Progress Notes (Signed)
Visit on 8-9, pt left before seeing provider

## 2012-05-23 ENCOUNTER — Encounter: Payer: Self-pay | Admitting: Obstetrics and Gynecology

## 2012-05-23 ENCOUNTER — Telehealth: Payer: Self-pay | Admitting: Obstetrics and Gynecology

## 2012-05-23 ENCOUNTER — Other Ambulatory Visit: Payer: Self-pay | Admitting: Obstetrics and Gynecology

## 2012-05-23 NOTE — Progress Notes (Signed)
Physician Discharge Summary  Patient ID: Autumn [name] MRN: [id number] DOB/AGE: 28/15/1987 28 y.o.  Admit date: (Not on file) Discharge date: 05/23/2012  Admission Diagnoses: 39 week IUP CHTN, diabetic Patient Active Problem List  Diagnosis  . HYPERTENSION  . Anemia  . Morbid obesity  . Bacterial vaginosis  . Leaky heart valve  . Chest pain  . Elevated glucose tolerance test  . Pregnancy complicated by noncompliance, antepartum  . Status post primary low transverse cesarean section    Discharge Diagnoses:  Active Problems:  * No active hospital problems. *   Patient Active Problem List  Diagnosis  . HYPERTENSION  . Anemia  . Morbid obesity  . Bacterial vaginosis  . Leaky heart valve  . Chest pain  . Elevated glucose tolerance test  . Pregnancy complicated by noncompliance, antepartum  . Status post primary low transverse cesarean section     Discharged Condition: stable  Hospital Course: 39 week induction cytotec, foley bulb, 39 2/7 FTP primary C/S. Micronor RX.  Consults: None  Significant Diagnostic Studies: labs:  Discharge Exam: Author:  Kirkland Hun, MD  Service:  Obstetrics  Author Type:  Physician   Filed:  05/17/12 1421  Note Time:  05/17/12 1418          Subjective:  Postpartum Day 3: Cesarean Delivery  Patient reports tolerating PO, + BM and no problems voiding.  Objective:  Vital signs in last 24 hours:  Temp: [97.4 F (36.3 C)-98.1 F (36.7 C)] 97.4 F (36.3 C) (08/23 0455)  Pulse Rate: [74-82] 82 (08/23 0455)  Resp: [18-20] 20 (08/23 0455)  BP: (140-159)/(80-87) 145/85 mmHg (08/23 0455)  Physical Exam:  General: alert and no distress  Lochia: appropriate  Uterine Fundus: firm  Incision: healing well, JP drain removed  DVT Evaluation: No evidence of DVT seen on physical exam.     Basename  05/16/12 0510  05/15/12 0537     HGB  8.8*  9.1*     HCT  27.3*  28.7*     Assessment/Plan:  Status post Cesarean section. Doing well  postoperatively.  Discharge to home.  Patient will see her cardiologist next week.  Continue labetalol 200 mg twice each day.  Call for difficulties.  Micronor for contraception.  Return to our office in 6 weeks  STRINGER,ARTHUR V  05/17/2012, 2:19 PM         Disposition: 01-Home or Self Care   Med List    Warning       Cannot display discharge medications because this is not an admission.              Signed: Current outpatient prescriptions:aspirin EC 81 MG tablet, Take 81 mg by mouth daily., Disp: , Rfl: ;  calcium carbonate (TUMS - DOSED IN MG ELEMENTAL CALCIUM) 500 MG chewable tablet, Chew 2 tablets by mouth daily as needed. For heartburn, Disp: , Rfl: ;  ferrous sulfate (FERROUSUL) 325 (65 FE) MG tablet, Take 1 tablet (325 mg total) by mouth 3 (three) times daily with meals., Disp: 100 tablet, Rfl: 11 hydrochlorothiazide (HYDRODIURIL) 25 MG tablet, Take 1 tablet (25 mg total) by mouth daily., Disp: 30 tablet, Rfl: 0;  ibuprofen (ADVIL,MOTRIN) 800 MG tablet, Take 1 tablet (800 mg total) by mouth every 8 (eight) hours as needed for pain., Disp: 50 tablet, Rfl: 1;  labetalol (NORMODYNE) 100 MG tablet, Take 2 tablets (200 mg total) by mouth 2 (two) times daily., Disp: 60 tablet, Rfl: 1 norethindrone (ORTHO MICRONOR) 0.35 MG  tablet, Take 1 tablet (0.35 mg total) by mouth daily., Disp: 1 Package, Rfl: 11;  oxyCODONE-acetaminophen (ROXICET) 5-325 MG per tablet, Take 1 tablet by mouth every 4 (four) hours as needed for pain., Disp: 50 tablet, Rfl: 0;  potassium chloride (K-DUR) 10 MEQ tablet, Take 1 tablet (10 mEq total) by mouth every morning., Disp: 30 tablet, Rfl: 1 Prenatal Vit-Fe Fumarate-FA (PRENATAL MULTIVITAMIN) TABS, Take 1 tablet by mouth daily., Disp: , Rfl:  Pt care, physical assessment, orders, and discharge plan by Dr. Stefano Gaul on 05/17/2012 see his progress note.  Autumn [name] 05/23/2012, 7:10 PM

## 2012-05-23 NOTE — Telephone Encounter (Signed)
[employer] [place] RN, called w/ pt's BP today.  Pt delivered by C/S on 05/14/12, BP=156/78.  Pt currently taking Labetolol 200mg  BID and HCTZ 25 mg Qday, also on K+, has 1+ edema and has had a HA x 2 days. Per AVS have pt come into office any day next week for BP check, can see anyone.  Tc to pt for scheduling, phone out of service, called Jeannie for a number pt can be reached.  Beverely Low will call back w/ number.

## 2012-06-10 ENCOUNTER — Encounter: Payer: Medicaid Other | Admitting: Obstetrics and Gynecology

## 2012-06-11 NOTE — Discharge Summary (Signed)
Cesarean Section Delivery Discharge Summary  Autumn [name]  DOB:    [birth year] MRN:    [id number] CSN:    [id number]  Date of admission:                  05/12/2012  Date of discharge:                   05/17/2012  Procedures this admission:  05/14/2012  Primary cesarean section   Newborn Data:  Live born female  Birth Weight: 8 lb 7.3 oz (3835 g) APGAR: 8, 9  Home with mother. Name: Unknown  History of Present Illness:  Ms. Autumn [name] is a 26 y.o. female, G1P1001, who presents at [redacted]w[redacted]d weeks gestation with a due date of 05/19/2012. The patient has been followed at the [facility] division of [facility]. She was admitted induction of labor. Her pregnancy has been complicated by: Hypertension and obesity (weight 333 pounds, height 5 feet 4 inches).  Hospital course:  The patient was admitted for induction of labor. She progressed to 8 cm but her labor arrested at that point. A cesarean delivery was performed and a healthy female infant was delivered.   Her postpartum course was not complicated. Her blood pressure was controlled with labetalol. She was discharged to home on postpartum day 3 doing well.  Feeding:  Bottle  Contraception:  oral progesterone-only contraceptive  Discharge hemoglobin:  Hemoglobin  Date Value Range Status  05/16/2012 8.8* 12.0 - 15.0 g/dL Final     HCT  Date Value Range Status  05/16/2012 27.3* 36.0 - 46.0 % Final    Discharge Physical Exam:   General: alert and no distress Lochia: appropriate Uterine Fundus: firm Incision: healing well, JP drain removed DVT Evaluation: No evidence of DVT seen on physical exam.  Intrapartum Procedures: cesarean: low cervical, transverse Postpartum Procedures: none Complications-Operative and Postpartum: none  Discharge Diagnoses: Term Pregnancy-delivered and Hypertension and morbid obesity  Discharge Information:  Activity:            unrestricted, pelvic rest and Routine post C-section activity Diet:                Low fat low-salt low-carb Medications: PNV, Ibuprofen, Percocet and Labetalol Condition:      improved Instructions:  The patient will see her cardiologist next week. She will return to the [facility] [place] in 6 weeks. Discharge to: home  Follow-up Information    Follow up with [facility] in 6 weeks.          Janine Limbo 06/11/2012

## 2012-06-25 ENCOUNTER — Encounter: Payer: Medicaid Other | Admitting: Obstetrics and Gynecology

## 2012-06-28 ENCOUNTER — Ambulatory Visit (INDEPENDENT_AMBULATORY_CARE_PROVIDER_SITE_OTHER): Payer: Medicaid Other | Admitting: Obstetrics and Gynecology

## 2012-06-28 ENCOUNTER — Encounter: Payer: Self-pay | Admitting: Obstetrics and Gynecology

## 2012-06-28 VITALS — BP 130/84 | Temp 98.6°F | Wt 305.0 lb

## 2012-06-28 DIAGNOSIS — O165 Unspecified maternal hypertension, complicating the puerperium: Secondary | ICD-10-CM

## 2012-06-28 DIAGNOSIS — Z23 Encounter for immunization: Secondary | ICD-10-CM

## 2012-06-28 MED ORDER — LABETALOL HCL 100 MG PO TABS: 200.0000 mg | ORAL_TABLET | Freq: Two times a day (BID) | ORAL | Status: DC

## 2012-06-28 MED ORDER — IBUPROFEN 800 MG PO TABS: 800.0000 mg | ORAL_TABLET | Freq: Three times a day (TID) | ORAL | Status: DC | PRN

## 2012-06-28 NOTE — Progress Notes (Signed)
DATE OF SURGERY: 05/14/2012 TYPE OF SURGERY:C-section PAIN:Yes  VAG BLEEDING: no VAG DISCHARGE: no NORMAL GI FUNCTN: yes NORMAL GU FUNCTN: yes  Pt requested Flu Vaccine Flu Shot Consent signed Flu shot given in Left Deltoid w/o any difficulties.

## 2012-06-28 NOTE — Progress Notes (Signed)
Pt is 5wks s/p primary c/s for FTP  S: pt reports back pain that has been since delivery, points to site of epidural, states it's worse w activities better when resting Also c/o of legs achy and painful, no localized tenderness or swelling C/o abdominal cramping and pain at incision site Has been taking motrin - says she takes 2 because 1 doesn't help (800mg  dose) Was taking oxycontin, which did help, but is now out of them Reports doing a lot of activity, house cleaning and taking care of baby Currently taking micronor but considering mirena, rv'd R/B, pamphlet given   O: VSS, on labetalol and HCTZ, hx CHTN Incision - healing well,  Back: no bruising or swelling, NT to touch  A: pain likely to increasing activity No signs of anything acute  P: RTO 1 wk for MD PP visit Recommended ice to back for comfort Resting if pain is increasing Take motrin 1 tab ATC, instructed not to take 2

## 2012-07-03 ENCOUNTER — Ambulatory Visit: Payer: Medicaid Other | Admitting: Obstetrics and Gynecology

## 2012-07-19 ENCOUNTER — Ambulatory Visit (INDEPENDENT_AMBULATORY_CARE_PROVIDER_SITE_OTHER): Payer: Medicaid Other | Admitting: Obstetrics and Gynecology

## 2012-07-19 ENCOUNTER — Encounter: Payer: Self-pay | Admitting: Obstetrics and Gynecology

## 2012-07-19 NOTE — Progress Notes (Signed)
Autumn [name]  is 9 week postpartum following a primary cesarean section, low transverse incision at 39 week 2 days gestational weeks  Date: 05/14/2012 female baby named Gerald Dexter delivered by Dr/Haygood.  Breastfeeding: yes Bottlefeeding:  yes  Post-partum blues / depression:  no  EPDS score: 6 History of abnormal Pap:  no  Last Pap: Date  11/30/2011 Gestational diabetes:  no  Contraception:  oral contraceptives :Orth-Micronor 0.35  Normal urinary function:  yes Normal GI function:  yes Returning to work:  Yes pt went back last Wednesday  Subjective:     Autumn [name] is a 26 y.o. female who presents for a postpartum visit. Currently using labetalol 200 mg BID and HCTZ 25 mg daily I have fully reviewed the prenatal and intrapartum course.   The following portions of the patient's history were reviewed and updated as appropriate: allergies, current medications, past family history, past medical history, past social history, past surgical history and problem list.  Review of Systems Pertinent items are noted in HPI.   Objective:    BP 124/82  Temp 98.6 F (37 C) (Oral)  Resp 16  Ht 5\' 4"  (1.626 m)  Wt 308 lb (139.708 kg)  BMI 52.87 kg/m2  LMP 07/02/2012  Breastfeeding? Yes  General:  alert, cooperative and no distress     Lungs: clear to auscultation bilaterally  Heart:  regular rate and rhythm, S1, S2 normal, no murmur  Abdomen: soft, non-tender; bowel sounds normal; no masses,  no organomegaly Incision normal   Vulva:  normal  Vagina: normal vagina  Cervix:  normal  Corpus: normal size, contour, position, consistency, mobility, non-tender  Adnexa:  normal adnexa             Assessment:     Normal postpartum exam.    Plan:   Continue current HTN meds while BF then return to PCP  Silverio Lay MD 07/19/2012 10:57 AM

## 2012-08-07 ENCOUNTER — Other Ambulatory Visit: Payer: Self-pay | Admitting: Obstetrics and Gynecology

## 2012-08-08 ENCOUNTER — Telehealth: Payer: Self-pay

## 2012-08-09 ENCOUNTER — Telehealth: Payer: Self-pay

## 2012-08-09 NOTE — Telephone Encounter (Signed)
Per AVS if pt needs Rx Hydrochlorothiazide 25MG  TAB  She needs to see her PCP or come in for an evaluation.  [facility] CMA

## 2012-08-13 NOTE — Telephone Encounter (Signed)
Which provider suggested this plan (me?)? That provider needs to prescribe the medication.  Dr. Stefano Gaul

## 2012-08-26 ENCOUNTER — Emergency Department (HOSPITAL_COMMUNITY)
Admission: EM | Admit: 2012-08-26 | Discharge: 2012-08-26 | Disposition: A | Discharge status: Home / Self Care | Payer: Self-pay | Attending: Emergency Medicine | Admitting: Emergency Medicine

## 2012-08-26 ENCOUNTER — Encounter (HOSPITAL_COMMUNITY): Payer: Self-pay | Admitting: *Deleted

## 2012-08-26 DIAGNOSIS — I10 Essential (primary) hypertension: Secondary | ICD-10-CM | POA: Insufficient documentation

## 2012-08-26 DIAGNOSIS — M79609 Pain in unspecified limb: Secondary | ICD-10-CM | POA: Insufficient documentation

## 2012-08-26 DIAGNOSIS — Z7982 Long term (current) use of aspirin: Secondary | ICD-10-CM | POA: Insufficient documentation

## 2012-08-26 DIAGNOSIS — M549 Dorsalgia, unspecified: Secondary | ICD-10-CM | POA: Insufficient documentation

## 2012-08-26 DIAGNOSIS — IMO0001 Reserved for inherently not codable concepts without codable children: Secondary | ICD-10-CM | POA: Insufficient documentation

## 2012-08-26 DIAGNOSIS — Z79899 Other long term (current) drug therapy: Secondary | ICD-10-CM | POA: Insufficient documentation

## 2012-08-26 DIAGNOSIS — R509 Fever, unspecified: Secondary | ICD-10-CM | POA: Insufficient documentation

## 2012-08-26 DIAGNOSIS — Z3202 Encounter for pregnancy test, result negative: Secondary | ICD-10-CM | POA: Insufficient documentation

## 2012-08-26 DIAGNOSIS — R109 Unspecified abdominal pain: Secondary | ICD-10-CM | POA: Insufficient documentation

## 2012-08-26 DIAGNOSIS — R197 Diarrhea, unspecified: Secondary | ICD-10-CM | POA: Insufficient documentation

## 2012-08-26 DIAGNOSIS — Z8679 Personal history of other diseases of the circulatory system: Secondary | ICD-10-CM | POA: Insufficient documentation

## 2012-08-26 DIAGNOSIS — E669 Obesity, unspecified: Secondary | ICD-10-CM | POA: Insufficient documentation

## 2012-08-26 DIAGNOSIS — R52 Pain, unspecified: Secondary | ICD-10-CM | POA: Insufficient documentation

## 2012-08-26 LAB — URINALYSIS, ROUTINE W REFLEX MICROSCOPIC
Glucose, UA: NEGATIVE mg/dL
Nitrite: NEGATIVE
Protein, ur: NEGATIVE mg/dL
pH: 6 (ref 5.0–8.0)

## 2012-08-26 LAB — CBC WITH DIFFERENTIAL/PLATELET
Basophils Absolute: 0 10*3/uL (ref 0.0–0.1)
Lymphocytes Relative: 23 % (ref 12–46)
Neutro Abs: 4.7 10*3/uL (ref 1.7–7.7)
Platelets: 285 10*3/uL (ref 150–400)
RDW: 15.2 % (ref 11.5–15.5)
WBC: 6.6 10*3/uL (ref 4.0–10.5)

## 2012-08-26 LAB — BASIC METABOLIC PANEL
CO2: 23 mEq/L (ref 19–32)
Calcium: 9.4 mg/dL (ref 8.4–10.5)
Chloride: 99 mEq/L (ref 96–112)
Sodium: 133 mEq/L — ABNORMAL LOW (ref 135–145)

## 2012-08-26 LAB — URINE MICROSCOPIC-ADD ON

## 2012-08-26 LAB — PREGNANCY, URINE: Preg Test, Ur: NEGATIVE

## 2012-08-26 MED ORDER — ACETAMINOPHEN 325 MG PO TABS
650.0000 mg | ORAL_TABLET | Freq: Once | ORAL | Status: AC
  Administered 2012-08-26: 650 mg via ORAL
  Filled 2012-08-26: qty 2

## 2012-08-26 MED ORDER — LOPERAMIDE HCL 2 MG PO CAPS
4.0000 mg | ORAL_CAPSULE | Freq: Once | ORAL | Status: AC
  Administered 2012-08-26: 4 mg via ORAL
  Filled 2012-08-26: qty 2

## 2012-08-26 MED ORDER — LOPERAMIDE HCL 2 MG PO CAPS: 2.0000 mg | ORAL_CAPSULE | Freq: Four times a day (QID) | ORAL | Status: DC | PRN

## 2012-08-26 MED ORDER — SODIUM CHLORIDE 0.9 % IV BOLUS (SEPSIS)
1000.0000 mL | Freq: Once | INTRAVENOUS | Status: AC
  Administered 2012-08-26: 1000 mL via INTRAVENOUS

## 2012-08-26 MED ORDER — MORPHINE SULFATE 4 MG/ML IJ SOLN
4.0000 mg | Freq: Once | INTRAMUSCULAR | Status: AC
  Administered 2012-08-26: 4 mg via INTRAVENOUS
  Filled 2012-08-26: qty 1

## 2012-08-26 NOTE — ED Notes (Signed)
PA at bedside.

## 2012-08-26 NOTE — ED Provider Notes (Signed)
History     CSN: [id number]  Arrival date & time 08/26/12  2952   First MD Initiated Contact with Patient 08/26/12 2027      Chief Complaint  Patient presents with  . Generalized Body Aches  . Diarrhea    (Consider location/radiation/quality/duration/timing/severity/associated sxs/prior treatment) HPI Comments: Patient reports she has had diarrhea and body aches x 3 days.  Reports pain in her back, her legs, and her lower abdomen.  Denies any blood in her stool.  Reports she has diarrhea "all day long," unable to quantify the number of bowel movements.  Also reports headache and fever or "110" at home, though states here it was 99.  States she is not getting worse, but is also not getting better.  Has taken aspirin last night without relief, has taken no other medications for her symptoms.  Her mother has been sick with the same symptoms.  Denies N/V, urinary symptoms, abnormal vaginal discharge or bleeding.  Pt had a cesarean section 8/20, reports she is completely healed, no complications or concerns following surgery.  Pt is breastfeeding.     The history is provided by the patient.    Past Medical History  Diagnosis Date  . Leaky heart valve   . Hypertension     stopped meds since pregnant  . Heart murmur   . H/O varicella   . Obese     Past Surgical History  Procedure Date  . No past surgeries   . Cesarean section 05/14/2012    Procedure: CESAREAN SECTION;  Surgeon: Hal Morales, MD;  Location: [facility];  Service: Gynecology;  Laterality: N/A;  Primary cesarean section of baby girl    at 0533 APGAR 8/9    Family History  Problem Relation Age of Onset  . Hypertension Mother   . Cancer Mother 70    breast  . Diabetes Sister   . Hypertension Sister   . Thyroid disease Sister   . Anemia Sister   . Anesthesia problems Neg Hx   . Heart disease Father     History  Substance Use Topics  . Smoking status: Never Smoker   . Smokeless tobacco: Never Used  . Alcohol  Use: No    OB History    Grav Para Term Preterm Abortions TAB SAB Ect Mult Living   1 1 1  0 0 0 0 0 0 1      Review of Systems  Constitutional: Positive for fever. Negative for chills.  Respiratory: Negative for cough and shortness of breath.   Cardiovascular: Negative for chest pain.  Gastrointestinal: Positive for abdominal pain and diarrhea. Negative for nausea, vomiting, constipation, blood in stool and anal bleeding.  Genitourinary: Negative for dysuria, urgency, frequency, vaginal bleeding and vaginal discharge.  Musculoskeletal: Positive for myalgias.    Allergies  Review of patient's allergies indicates no known allergies.  Home Medications   Current Outpatient Rx  Name  Route  Sig  Dispense  Refill  . ASPIRIN EC 81 MG PO TBEC   Oral   Take 81 mg by mouth daily.         Marland Kitchen CALCIUM CARBONATE ANTACID 500 MG PO CHEW   Oral   Chew 2 tablets by mouth daily as needed. For heartburn         . FERROUS SULFATE 325 (65 FE) MG PO TABS   Oral   Take 1 tablet (325 mg total) by mouth 3 (three) times daily with meals.   100 tablet  11   . HYDROCHLOROTHIAZIDE 25 MG PO TABS   Oral   Take 1 tablet (25 mg total) by mouth daily.   30 tablet   0   . IBUPROFEN 800 MG PO TABS   Oral   Take 1 tablet (800 mg total) by mouth every 8 (eight) hours as needed.   30 tablet   2   . LABETALOL HCL 100 MG PO TABS   Oral   Take 2 tablets (200 mg total) by mouth 2 (two) times daily.   60 tablet   1   . NORETHINDRONE 0.35 MG PO TABS   Oral   Take 1 tablet (0.35 mg total) by mouth daily.   1 Package   11   . POTASSIUM CHLORIDE ER 10 MEQ PO TBCR   Oral   Take 1 tablet (10 mEq total) by mouth every morning.   30 tablet   1   . PRENATAL MULTIVITAMIN CH   Oral   Take 1 tablet by mouth daily.           BP 156/91  Pulse 98  Temp 98.9 F (37.2 C) (Oral)  Resp 20  SpO2 100%  Breastfeeding? Yes  Physical Exam  Nursing note and vitals reviewed. Constitutional: She  appears well-developed and well-nourished. No distress.  HENT:  Head: Normocephalic and atraumatic.  Neck: Neck supple.  Cardiovascular: Normal rate and regular rhythm.   Pulmonary/Chest: Effort normal and breath sounds normal. No respiratory distress. She has no wheezes. She has no rales.  Abdominal: Soft. Bowel sounds are normal. She exhibits no distension and no mass. There is tenderness. There is no rebound and no guarding.       Diffuse lower abdominal tenderness.  No localized tenderness.    Low transverse cesarean section incision fully healed.   Neurological: She is alert.  Skin: She is not diaphoretic.    ED Course  Procedures (including critical care time)  Labs Reviewed  CBC WITH DIFFERENTIAL - Abnormal; Notable for the following:    MCH 25.8 (*)     All other components within normal limits  BASIC METABOLIC PANEL - Abnormal; Notable for the following:    Sodium 133 (*)     Glucose, Bld 102 (*)     All other components within normal limits  URINALYSIS, ROUTINE W REFLEX MICROSCOPIC - Abnormal; Notable for the following:    APPearance CLOUDY (*)     Leukocytes, UA SMALL (*)     All other components within normal limits  URINE MICROSCOPIC-ADD ON - Abnormal; Notable for the following:    Squamous Epithelial / LPF MANY (*)     Bacteria, UA FEW (*)     All other components within normal limits  PREGNANCY, URINE  URINE CULTURE   No results found.  8:43 PM Pt advised pain medication will cross into her breastmilk.  Pt verbalized understanding and despite this does want pain medication.    10:31 PM Reexamination of abdomen is benign.  Soft, nondistended, mild-moderate tenderness of lower abdomen without localized tenderness, without guarding, or rebound.  1. Diarrhea     MDM  Pt with several days of body aches, diarrhea.  Mother with similar symptoms, significant other other with complaints of "upset stomach and dehydration."  They are suspicious of something they ate  for thanksgiving though this may be viral.  No focal tenderness of abdominal exam, labs unremarkable.  Pt improved with pain medication, IVF.  Pt advised imodium is considered safe in  breastfeeding.  I have advised PO hydration at home.  Pt is tolerating PO in ED without difficulty.  Pt is afebrile, nontoxic.  Plan is for d/c home with home pain medications (pt given ibuprofen 800mg  by OB) and imodium as needed.  Discussed all results with patient.  Pt given return precautions.  Pt verbalizes understanding and agrees with plan.           [place], [place] 08/27/12 [phone]

## 2012-08-26 NOTE — Discharge Instructions (Signed)
Read the information below.  Drink plenty of fluids and rest over the next few days.  If you develop high fevers, worsening abdominal pain, uncontrolled vomiting or profuse diarrhea despite using imodium, or if you feel you are getting dehydrated (not urinating very much, dry mouth), return to the ER for a recheck.  Use the prescribed medication as directed.  Please discuss all new medications with your pharmacist.  You may return to the Emergency Department at any time for worsening condition or any new symptoms that concern you.       .Diarrhea Infections caused by germs (bacterial) or a virus commonly cause diarrhea. Your caregiver has determined that with time, rest and fluids, the diarrhea should improve. In general, eat normally while drinking more water than usual. Although water may prevent dehydration, it does not contain salt and minerals (electrolytes). Broths, weak tea without caffeine and oral rehydration solutions (ORS) replace fluids and electrolytes. Small amounts of fluids should be taken frequently. Large amounts at one time may not be tolerated. Plain water may be harmful in infants and the elderly. Oral rehydrating solutions (ORS) are available at pharmacies and grocery stores. ORS replace water and important electrolytes in proper proportions. Sports drinks are not as effective as ORS and may be harmful due to sugars worsening diarrhea.  ORS is especially recommended for use in children with diarrhea. As a general guideline for children, replace any new fluid losses from diarrhea and/or vomiting with ORS as follows:  If your child weighs 22 pounds or under (10 kg or less), give 60-120 mL ( -  cup or 2 - 4 ounces) of ORS for each episode of diarrheal stool or vomiting episode.  If your child weighs more than 22 pounds (more than 10 kgs), give 120-240 mL ( - 1 cup or 4 - 8 ounces) of ORS for each diarrheal stool or episode of vomiting.  While correcting for dehydration,  children should eat normally. However, foods high in sugar should be avoided because this may worsen diarrhea. Large amounts of carbonated soft drinks, juice, gelatin desserts and other highly sugared drinks should be avoided.  After correction of dehydration, other liquids that are appealing to the child may be added. Children should drink small amounts of fluids frequently and fluids should be increased as tolerated. Children should drink enough fluids to keep urine clear or pale yellow.  Adults should eat normally while drinking more fluids than usual. Drink small amounts of fluids frequently and increase as tolerated. Drink enough fluids to keep urine clear or pale yellow. Broths, weak decaffeinated tea, lemon lime soft drinks (allowed to go flat) and ORS replace fluids and electrolytes.  Avoid:  Carbonated drinks.  Juice.  Extremely hot or cold fluids.  Caffeine drinks.  Fatty, greasy foods.  Alcohol.  Tobacco.  Too much intake of anything at one time.  Gelatin desserts.  Probiotics are active cultures of beneficial bacteria. They may lessen the amount and number of diarrheal stools in adults. Probiotics can be found in yogurt with active cultures and in supplements.  Wash hands well to avoid spreading bacteria and virus.  Anti-diarrheal medications are not recommended for infants and children.  Only take over-the-counter or prescription medicines for pain, discomfort or fever as directed by your caregiver. Do not give aspirin to children because it may cause Reye's Syndrome.  For adults, ask your caregiver if you should continue all prescribed and over-the-counter medicines.  If your caregiver has given you a follow-up appointment, it  is very important to keep that appointment. Not keeping the appointment could result in a chronic or permanent injury, and disability. If there is any problem keeping the appointment, you must call back to this facility for assistance. SEEK  IMMEDIATE MEDICAL CARE IF:   You or your child is unable to keep fluids down or other symptoms or problems become worse in spite of treatment.  Vomiting or diarrhea develops and becomes persistent.  There is vomiting of blood or bile (green material).  There is blood in the stool or the stools are black and tarry.  There is no urine output in 6-8 hours or there is only a small amount of very dark urine.  Abdominal pain develops, increases or localizes.  You have a fever.  Your baby is older than 3 months with a rectal temperature of 102 F (38.9 C) or higher.  Your baby is 48 months old or younger with a rectal temperature of 100.4 F (38 C) or higher.  You or your child develops excessive weakness, dizziness, fainting or extreme thirst.  You or your child develops a rash, stiff neck, severe headache or become irritable or sleepy and difficult to awaken. MAKE SURE YOU:   Understand these instructions.  Will watch your condition.  Will get help right away if you are not doing well or get worse. Document Released: 09/01/2002 Document Revised: 12/04/2011 Document Reviewed: 07/19/2009 [facility] Patient Information 2013 [place], [place]. Diet for Diarrhea, Adult Having frequent, runny stools (diarrhea) has many causes. Diarrhea may be caused or worsened by food or drink. Diarrhea may be relieved by changing your diet. IF YOU ARE NOT TOLERATING SOLID FOODS:  Drink enough water and fluids to keep your urine clear or pale yellow.  Avoid sugary drinks and sodas as well as milk-based beverages.  Avoid beverages containing caffeine and alcohol.  You may try rehydrating beverages. You can make your own by following this recipe:   tsp table salt.   tsp baking soda.   tsp salt substitute (potassium chloride).  1 tbs + 1 tsp sugar.  1 qt water. As your stools become more solid, you can start eating solid foods. Add foods one at a time. If a certain food causes your diarrhea to  get worse, avoid that food and try other foods. A low fiber, low-fat, and lactose-free diet is recommended. Small, frequent meals may be better tolerated.  Starches  Allowed:  White, [place], and pita breads, plain rolls, buns, bagels. Plain muffins, matzo. Soda, saltine, or graham crackers. Pretzels, melba toast, zwieback. Cooked cereals made with water: cornmeal, farina, cream cereals. Dry cereals: refined corn, wheat, rice. Potatoes prepared any way without skins, refined macaroni, spaghetti, noodles, refined rice.  Avoid:  Bread, rolls, or crackers made with whole wheat, multi-grains, rye, bran seeds, nuts, or coconut. Corn tortillas or taco shells. Cereals containing whole grains, multi-grains, bran, coconut, nuts, or raisins. Cooked or dry oatmeal. Coarse wheat cereals, granola. Cereals advertised as "high-fiber." Potato skins. Whole grain pasta, wild or brown rice. Popcorn. Sweet potatoes/yams. Sweet rolls, doughnuts, waffles, pancakes, sweet breads. Vegetables  Allowed: Strained tomato and vegetable juices. Most well-cooked and canned vegetables without seeds. Fresh: Tender lettuce, cucumber without the skin, cabbage, spinach, bean sprouts.  Avoid: Fresh, cooked, or canned: Artichokes, baked beans, beet greens, broccoli, Brussels sprouts, corn, kale, legumes, peas, sweet potatoes. Cooked: Green or red cabbage, spinach. Avoid large servings of any vegetables, because vegetables shrink when cooked, and they contain more fiber per serving than fresh  vegetables. Fruit  Allowed: All fruit juices except prune juice. Cooked or canned: Apricots, applesauce, cantaloupe, cherries, fruit cocktail, grapefruit, grapes, kiwi, mandarin oranges, peaches, pears, plums, watermelon. Fresh: Apples without skin, ripe banana, grapes, cantaloupe, cherries, grapefruit, peaches, oranges, plums. Keep servings limited to  cup or 1 piece.  Avoid: Fresh: Apple with skin, apricots, mango, pears, raspberries, strawberries.  Prune juice, stewed or dried prunes. Dried fruits, raisins, dates. Large servings of all fresh fruits. Meat and Meat Substitutes  Allowed: Ground or well-cooked tender beef, ham, veal, lamb, pork, or poultry. Eggs, plain cheese. Fish, oysters, shrimp, lobster, other seafoods. Liver, organ meats.  Avoid: Tough, fibrous meats with gristle. Peanut butter, smooth or chunky. Cheese, nuts, seeds, legumes, dried peas, beans, lentils. Milk  Allowed: Yogurt, lactose-free milk, kefir, drinkable yogurt, buttermilk, soy milk.  Avoid: Milk, chocolate milk, beverages made with milk, such as milk shakes. Soups  Allowed: Bouillon, broth, or soups made from allowed foods. Any strained soup.  Avoid: Soups made from vegetables that are not allowed, cream or milk-based soups. Desserts and Sweets  Allowed: Sugar-free gelatin, sugar-free frozen ice pops made without sugar alcohol.  Avoid: Plain cakes and cookies, pie made with allowed fruit, pudding, custard, cream pie. Gelatin, fruit, ice, sherbet, frozen ice pops. Ice cream, ice milk without nuts. Plain hard candy, honey, jelly, molasses, syrup, sugar, chocolate syrup, gumdrops, marshmallows. Fats and Oils  Allowed: Avoid any fats and oils.  Avoid: Seeds, nuts, olives, avocados. Margarine, butter, cream, mayonnaise, salad oils, plain salad dressings made from allowed foods. Plain gravy, crisp bacon without rind. Beverages  Allowed: Water, decaffeinated teas, oral rehydration solutions, sugar-free beverages.  Avoid: Fruit juices, caffeinated beverages (coffee, tea, soda or pop), alcohol, sports drinks, or lemon-lime soda or pop. Condiments  Allowed: Ketchup, mustard, horseradish, vinegar, cream sauce, cheese sauce, cocoa powder. Spices in moderation: allspice, basil, bay leaves, celery powder or leaves, cinnamon, cumin powder, curry powder, ginger, mace, marjoram, onion or garlic powder, oregano, paprika, parsley flakes, ground pepper, rosemary, sage,  savory, tarragon, thyme, turmeric.  Avoid: Coconut, honey. Weight Monitoring: Weigh yourself every day. You should weigh yourself in the morning after you urinate and before you eat breakfast. Wear the same amount of clothing when you weigh yourself. Record your weight daily. Bring your recorded weights to your clinic visits. Tell your caregiver right away if you have gained 3 lb/1.4 kg or more in 1 day, 5 lb/2.3 kg in a week, or whatever amount you were told to report. SEEK IMMEDIATE MEDICAL CARE IF:   You are unable to keep fluids down.  You start to throw up (vomit) or diarrhea keeps coming back (persistent).  Abdominal pain develops, increases, or can be felt in one place (localizes).  You have an oral temperature above 102 F (38.9 C), not controlled by medicine.  Diarrhea contains blood or mucus.  You develop excessive weakness, dizziness, fainting, or extreme thirst. MAKE SURE YOU:   Understand these instructions.  Will watch your condition.  Will get help right away if you are not doing well or get worse. Document Released: 12/02/2003 Document Revised: 12/04/2011 Document Reviewed: 01/26/2012 [facility] Patient Information 2013 [place], [place].

## 2012-08-26 NOTE — ED Notes (Signed)
Pt in c/o body aches, abd pain, diarrhea, fever x3 days.

## 2012-08-26 NOTE — ED Notes (Addendum)
Pt reports generalized weakness since yesterday. Reports waking up to fever (100.9 at home) and chills. Denies taking any medication.

## 2012-08-27 NOTE — ED Provider Notes (Signed)
Medical screening examination/treatment/procedure(s) were performed by non-physician practitioner and as supervising physician I was immediately available for consultation/collaboration.  John-Adam Gisell Buehrle, M.D.     John-Adam July Linam, MD 08/27/12 0156 

## 2012-08-28 LAB — URINE CULTURE: Colony Count: >=100000

## 2012-08-29 IMAGING — CR DG CHEST 1V
1 series · 1 of 1 positions shown · non-contrast
Comparison: Chest x-ray of 09/06/2011

CLINICAL DATA: Heart palpitations, cough, mid chest pain

CHEST - 1 VIEW

[w chest pa]
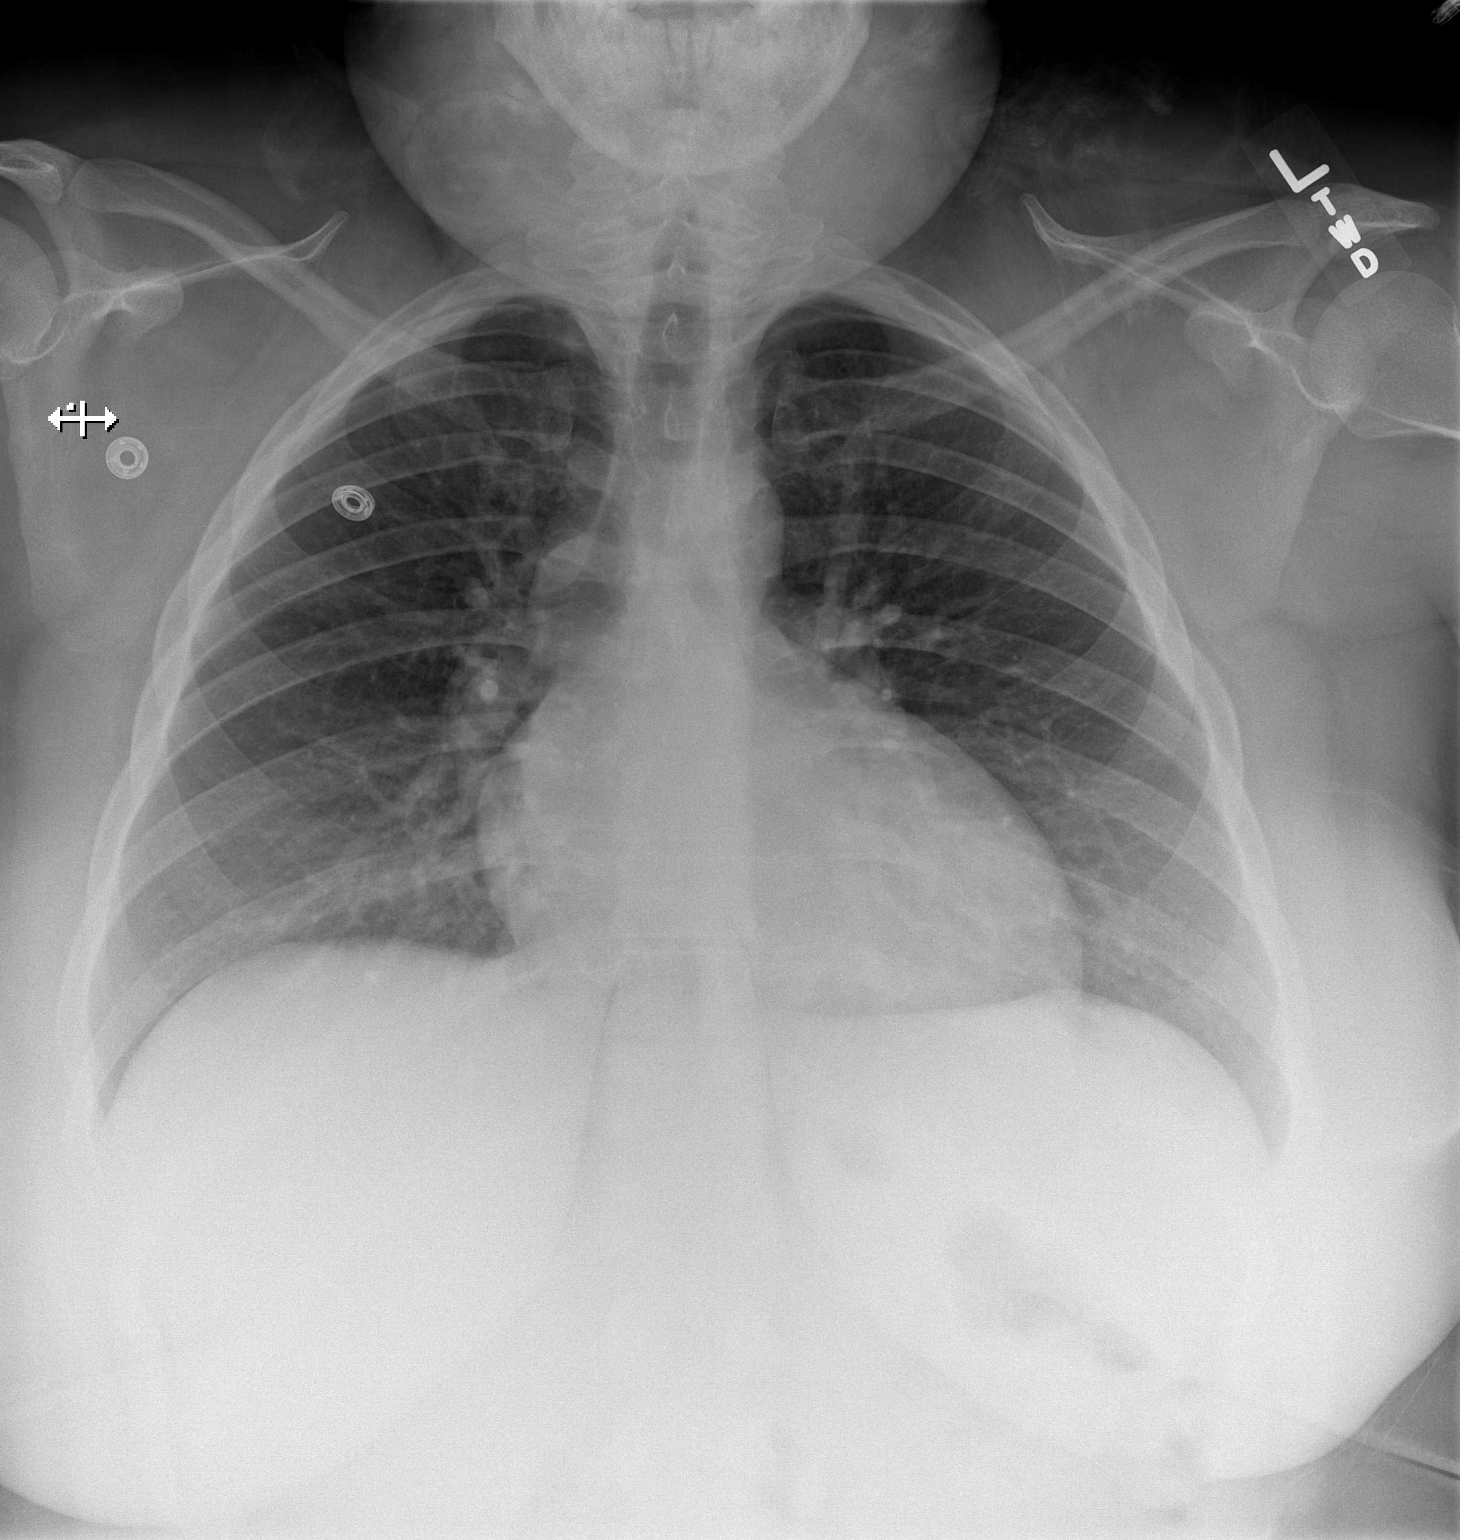

[1 of 1 positions shown; findings below may reference images not displayed]

FINDINGS: The patient is pregnant and her abdomen and pelvis were
lead shielded.  Therefore, only a single view was obtained.  The
lungs are clear.  The heart is within upper limits of normal.  No
bony abnormality is seen.
IMPRESSION: No active lung disease.

## 2012-10-08 ENCOUNTER — Emergency Department (HOSPITAL_COMMUNITY)
Admission: EM | Admit: 2012-10-08 | Discharge: 2012-10-08 | Disposition: A | Discharge status: Home / Self Care | Payer: Self-pay | Attending: Emergency Medicine | Admitting: Emergency Medicine

## 2012-10-08 ENCOUNTER — Encounter (HOSPITAL_COMMUNITY): Payer: Self-pay | Admitting: *Deleted

## 2012-10-08 DIAGNOSIS — Z8619 Personal history of other infectious and parasitic diseases: Secondary | ICD-10-CM | POA: Insufficient documentation

## 2012-10-08 DIAGNOSIS — Z7982 Long term (current) use of aspirin: Secondary | ICD-10-CM | POA: Insufficient documentation

## 2012-10-08 DIAGNOSIS — L600 Ingrowing nail: Secondary | ICD-10-CM | POA: Insufficient documentation

## 2012-10-08 DIAGNOSIS — Z8679 Personal history of other diseases of the circulatory system: Secondary | ICD-10-CM | POA: Insufficient documentation

## 2012-10-08 DIAGNOSIS — I10 Essential (primary) hypertension: Secondary | ICD-10-CM | POA: Insufficient documentation

## 2012-10-08 DIAGNOSIS — Z79899 Other long term (current) drug therapy: Secondary | ICD-10-CM | POA: Insufficient documentation

## 2012-10-08 DIAGNOSIS — E669 Obesity, unspecified: Secondary | ICD-10-CM | POA: Insufficient documentation

## 2012-10-08 DIAGNOSIS — R011 Cardiac murmur, unspecified: Secondary | ICD-10-CM | POA: Insufficient documentation

## 2012-10-08 MED ORDER — HYDROCODONE-ACETAMINOPHEN 5-325 MG PO TABS: 1.0000 | ORAL_TABLET | Freq: Four times a day (QID) | ORAL | Status: DC | PRN

## 2012-10-08 MED ORDER — CEPHALEXIN 500 MG PO CAPS: 500.0000 mg | ORAL_CAPSULE | Freq: Four times a day (QID) | ORAL | Status: DC

## 2012-10-08 MED ORDER — BUPIVACAINE HCL 0.5 % IJ SOLN
50.0000 mL | Freq: Once | INTRAMUSCULAR | Status: AC
  Administered 2012-10-08: 50 mL
  Filled 2012-10-08: qty 50

## 2012-10-08 NOTE — ED Provider Notes (Signed)
History   This chart was scribed for non-physician practitioner working with Autumn Booze, MD by Sofie Rower, ED Scribe. This patient was seen in room TR08C/TR08C and the patient's care was started at 7:45PM.      CSN: [id number]  Arrival date & time 10/08/12  1727   First MD Initiated Contact with Patient 10/08/12 1945      Chief Complaint  Patient presents with  . Bilateral big toe infection     (Consider location/radiation/quality/duration/timing/severity/associated sxs/prior treatment) Patient is a 27 y.o. female presenting with toe pain and general illness. The history is provided by the patient. No language interpreter was used.  Toe Pain This is a new problem. The current episode started more than 2 days ago (one week ago). The problem has been gradually worsening. The symptoms are aggravated by walking. Nothing relieves the symptoms. She has tried water [employer] and water soak) for the symptoms. The treatment provided no relief.  Illness  The current episode started 5 to 7 days ago. The onset was sudden. The problem occurs rarely. The problem has been gradually worsening. The problem is moderate. Nothing relieves the symptoms. The symptoms are aggravated by movement.    Autumn [name] is a 27 y.o. female , with a hx of hypertension and heart murmur, who presents to the Emergency Department complaining of sudden, progressively worsening, toe pain located at the bilateral first toes, onset one week ago.  Associated symptoms include drainage consisting of puss and blood coming from the bilateral toes. The pt has soaked and washed her feet at home, however, the washings do not provide relief of the toe pain associated with the toe infection.    The pt does not smoke or drink alcohol.   Pt does not have a PCP.    Past Medical History  Diagnosis Date  . Leaky heart valve   . Hypertension     stopped meds since pregnant  . Heart murmur   . H/O varicella   . Obese     Past  Surgical History  Procedure Date  . No past surgeries   . Cesarean section 05/14/2012    Procedure: CESAREAN SECTION;  Surgeon: Hal Morales, MD;  Location: [facility];  Service: Gynecology;  Laterality: N/A;  Primary cesarean section of baby girl    at 0533 APGAR 8/9    Family History  Problem Relation Age of Onset  . Hypertension Mother   . Cancer Mother 55    breast  . Diabetes Sister   . Hypertension Sister   . Thyroid disease Sister   . Anemia Sister   . Anesthesia problems Neg Hx   . Heart disease Father     History  Substance Use Topics  . Smoking status: Never Smoker   . Smokeless tobacco: Never Used  . Alcohol Use: No    OB History    Grav Para Term Preterm Abortions TAB SAB Ect Mult Living   1 1 1  0 0 0 0 0 0 1      Review of Systems  All other systems reviewed and are negative.    Allergies  Review of patient's allergies indicates no known allergies.  Home Medications   Current Outpatient Rx  Name  Route  Sig  Dispense  Refill  . ASPIRIN EC 81 MG PO TBEC   Oral   Take 81 mg by mouth every evening.          Marland Kitchen HYDROCHLOROTHIAZIDE 25 MG PO TABS  Oral   Take 1 tablet (25 mg total) by mouth daily.   30 tablet   0   . LABETALOL HCL 100 MG PO TABS   Oral   Take 2 tablets (200 mg total) by mouth 2 (two) times daily.   60 tablet   1     BP 156/86  Pulse 81  Temp 98.9 F (37.2 C) (Oral)  Resp 18  SpO2 100%  LMP 09/08/2012  Physical Exam  Nursing note and vitals reviewed. Constitutional: She is oriented to person, place, and time. She appears well-developed and well-nourished.  HENT:  Head: Atraumatic.  Nose: Nose normal.  Cardiovascular: Normal rate, regular rhythm and normal heart sounds.   Pulmonary/Chest: Effort normal and breath sounds normal.  Abdominal: Soft. Bowel sounds are normal.  Musculoskeletal: Normal range of motion.  Neurological: She is alert and oriented to person, place, and time.  Skin: Skin is warm and dry.    Psychiatric: She has a normal mood and affect. Her behavior is normal.    ED Course  NAIL REMOVAL Performed by: Jimmye Norman Authorized by: Jimmye Norman Consent: Verbal consent obtained. Risks and benefits: risks, benefits and alternatives were discussed Consent given by: patient Patient understanding: patient states understanding of the procedure being performed Patient consent: the patient's understanding of the procedure matches consent given Patient identity confirmed: verbally with patient Time out: Immediately prior to procedure a "time out" was called to verify the correct patient, procedure, equipment, support staff and site/side marked as required. Location: right foot Location details: right big toe Anesthesia: digital block Local anesthetic: lidocaine 2% without epinephrine and bupivacaine 0.5% with epinephrine Anesthetic total: 4 ml Patient sedated: no Preparation: skin prepped with Betadine Amount removed: partial Nail removed location: medial. Nail bed sutured: no Nail matrix removed: partial Dressing: antibiotic ointment and dressing applied Patient tolerance: Patient tolerated the procedure well with no immediate complications.  Same procedure performed on left great toe.   DIAGNOSTIC STUDIES: Oxygen Saturation is 100% on room air, normal by my interpretation.    COORDINATION OF CARE:  7:58 PM- Treatment plan concerning drainage of toe infection discussed with patient. Pt agrees with treatment.  8:20 PM-Recheck. Treatment plan discussed with patient. Pt agrees with treatment.     .    Labs Reviewed - No data to display No results found.   No diagnosis found.  Bilateral ingrown great toenails.  MDM   I personally performed the services described in this documentation, which was scribed in my presence. The recorded information has been reviewed and is accurate.         Jimmye Norman, NP 10/09/12 [phone]

## 2012-10-08 NOTE — ED Notes (Signed)
PT is here with bilateral big toe infections.  Not diabetic

## 2012-10-09 NOTE — ED Provider Notes (Signed)
Medical screening examination/treatment/procedure(s) were performed by non-physician practitioner and as supervising physician I was immediately available for consultation/collaboration.   [name], MD 10/09/12 [phone]

## 2012-12-05 ENCOUNTER — Encounter (HOSPITAL_COMMUNITY): Payer: Self-pay | Admitting: *Deleted

## 2012-12-05 ENCOUNTER — Inpatient Hospital Stay (HOSPITAL_COMMUNITY)
Admission: AD | Admit: 2012-12-05 | Discharge: 2012-12-05 | Disposition: A | Discharge status: Home / Self Care | Payer: PRIVATE HEALTH INSURANCE | Source: Ambulatory Visit | Attending: Obstetrics and Gynecology | Admitting: Obstetrics and Gynecology

## 2012-12-05 ENCOUNTER — Inpatient Hospital Stay (HOSPITAL_COMMUNITY): Payer: PRIVATE HEALTH INSURANCE

## 2012-12-05 DIAGNOSIS — O039 Complete or unspecified spontaneous abortion without complication: Secondary | ICD-10-CM | POA: Insufficient documentation

## 2012-12-05 DIAGNOSIS — O0289 Other abnormal products of conception: Secondary | ICD-10-CM | POA: Insufficient documentation

## 2012-12-05 DIAGNOSIS — R109 Unspecified abdominal pain: Secondary | ICD-10-CM | POA: Insufficient documentation

## 2012-12-05 DIAGNOSIS — I10 Essential (primary) hypertension: Secondary | ICD-10-CM | POA: Insufficient documentation

## 2012-12-05 LAB — CBC WITH DIFFERENTIAL/PLATELET
Eosinophils Relative: 1 % (ref 0–5)
HCT: 34.3 % — ABNORMAL LOW (ref 36.0–46.0)
Hemoglobin: 11.1 g/dL — ABNORMAL LOW (ref 12.0–15.0)
Lymphocytes Relative: 27 % (ref 12–46)
Lymphs Abs: 2.2 10*3/uL (ref 0.7–4.0)
MCV: 79.6 fL (ref 78.0–100.0)
Monocytes Absolute: 0.4 10*3/uL (ref 0.1–1.0)
Monocytes Relative: 5 % (ref 3–12)
Neutro Abs: 5.4 10*3/uL (ref 1.7–7.7)
RBC: 4.31 MIL/uL (ref 3.87–5.11)
WBC: 8.1 10*3/uL (ref 4.0–10.5)

## 2012-12-05 LAB — WET PREP, GENITAL: Trich, Wet Prep: NONE SEEN

## 2012-12-05 MED ORDER — KETOROLAC TROMETHAMINE 60 MG/2ML IM SOLN
60.0000 mg | Freq: Once | INTRAMUSCULAR | Status: AC
  Administered 2012-12-05: 60 mg via INTRAMUSCULAR
  Filled 2012-12-05: qty 2

## 2012-12-05 NOTE — MAU Note (Signed)
Positive preg test at home 3 wks ago, was at work today, started cramping, noted bleeding when went to the bathroom, bleeding got heavier- passed a large clot, feels more coming out now.  First appt with [facility] is on Mon.

## 2012-12-05 NOTE — MAU Provider Note (Signed)
History   Autumn [name] is a 27y.o. Obese BF who presents unannounced w/ CC of cramping and VB since about 1600 this afternoon, w/ some passage of clots.  She has been taking Ibuprofen recently for a toothache, Rt side.  No GI c/o's.  Only resp c/o is some "sinus drainage."  Denies fever or chills.  Reports recent ingrown toenail (Rt big toe) and saw a podiatrist last week.  Pt reports LNMP 10/07/12.  She has monthly periods and this one was normal and on time.  She had a c/s about 7 months ago for FTP.  She works at [employer] at [employer].  She is accompanied by her s.o. "Ignacia Bayley," and another older female guest.  Pt hasn't started care this pregnancy yet, but has appt Mon for 1145.  Had a pos UPT 3 weeks ago.  CSN: [id number]  Arrival date and time: 12/05/12 1849   None     Chief Complaint  Patient presents with  . Vaginal Bleeding  . Abdominal Pain   HPI  OB History   Grav Para Term Preterm Abortions TAB SAB Ect Mult Living   2 1 1  0 0 0 0 0 0 1      Past Medical History  Diagnosis Date  . Leaky heart valve   . Hypertension     stopped meds since pregnant  . Heart murmur   . H/O varicella   . Obese     Past Surgical History  Procedure Laterality Date  . No past surgeries    . Cesarean section  05/14/2012    Procedure: CESAREAN SECTION;  Surgeon: Hal Morales, MD;  Location: [facility];  Service: Gynecology;  Laterality: N/A;  Primary cesarean section of baby girl    at 0533 APGAR 8/9    Family History  Problem Relation Age of Onset  . Hypertension Mother   . Cancer Mother 91    breast  . Diabetes Sister   . Hypertension Sister   . Thyroid disease Sister   . Anemia Sister   . Anesthesia problems Neg Hx   . Heart disease Father     History  Substance Use Topics  . Smoking status: Never Smoker   . Smokeless tobacco: Never Used  . Alcohol Use: No    Allergies: No Known Allergies  Prescriptions prior to admission  Medication Sig Dispense Refill  . aspirin EC  81 MG tablet Take 81 mg by mouth every evening.       Marland Kitchen ibuprofen (ADVIL,MOTRIN) 200 MG tablet Take 800 mg by mouth every 6 (six) hours as needed for pain.        ROS--see HPI Physical Exam   Blood pressure 135/74, pulse 90, temperature 98 F (36.7 C), temperature source Oral, resp. rate 20, height 5\' 5"  (1.651 m), weight 290 lb (131.543 kg), last menstrual period 10/07/2012.  Physical Exam  Constitutional: She is oriented to person, place, and time. She appears well-developed and well-nourished. No distress.  HENT:  Head: Normocephalic and atraumatic.  Eyes: Pupils are equal, round, and reactive to light.  Cardiovascular: Normal rate.   Respiratory: Effort normal.  GI: Soft. She exhibits no distension and no mass. There is no tenderness. There is no rebound and no guarding.  Genitourinary:  SSE: large amt of BRB pooling in vault.  Not actively bleeding.  Cleared w/ 4 fox swabs.  4-5 cm piece of tissue on pt's pad, likely POC.  No lesions.  cx soft, closed, anterior.  Musculoskeletal:  Infected Rt big toe from ingrown toenail  Neurological: She is alert and oriented to person, place, and time.  Skin: Skin is warm and dry.  Psychiatric: She has a normal mood and affect. Her behavior is normal. Judgment and thought content normal.  .. Results for orders placed during the hospital encounter of 12/05/12 (from the past 24 hour(s))  WET PREP, GENITAL     Status: None   Collection Time    12/05/12  8:15 PM      Result Value Range   Yeast Wet Prep HPF POC NONE SEEN  NONE SEEN   Trich, Wet Prep NONE SEEN  NONE SEEN   Clue Cells Wet Prep HPF POC NONE SEEN  NONE SEEN   WBC, Wet Prep HPF POC NONE SEEN  NONE SEEN  HCG, QUANTITATIVE, PREGNANCY     Status: Abnormal   Collection Time    12/05/12  8:25 PM      Result Value Range   hCG, Beta Chain, Quant, S 2454 (*) <5 mIU/mL  CBC WITH DIFFERENTIAL     Status: Abnormal   Collection Time    12/05/12  8:25 PM      Result Value Range   WBC 8.1   4.0 - 10.5 K/uL   RBC 4.31  3.87 - 5.11 MIL/uL   Hemoglobin 11.1 (*) 12.0 - 15.0 g/dL   HCT 16.1 (*) 09.6 - 04.5 %   MCV 79.6  78.0 - 100.0 fL   MCH 25.8 (*) 26.0 - 34.0 pg   MCHC 32.4  30.0 - 36.0 g/dL   RDW 40.9  81.1 - 91.4 %   Platelets 308  150 - 400 K/uL   Neutrophils Relative 66  43 - 77 %   Neutro Abs 5.4  1.7 - 7.7 K/uL   Lymphocytes Relative 27  12 - 46 %   Lymphs Abs 2.2  0.7 - 4.0 K/uL   Monocytes Relative 5  3 - 12 %   Monocytes Absolute 0.4  0.1 - 1.0 K/uL   Eosinophils Relative 1  0 - 5 %   Eosinophils Absolute 0.1  0.0 - 0.7 K/uL   Basophils Relative 0  0 - 1 %   Basophils Absolute 0.0  0.0 - 0.1 K/uL  *RADIOLOGY REPORT*  Clinical Data: Bleeding, cramping, ? pregnant, beta HCG pending.  OBSTETRIC <14 WK [place] AND TRANSVAGINAL OB US  Technique: Both transabdominal and transvaginal ultrasound  examinations were performed for complete evaluation of the  gestation as well as the maternal uterus, adnexal regions, and  pelvic cul-de-sac. Transvaginal technique was performed to assess  early pregnancy.  Comparison: None.  Intrauterine gestational sac: Not identified  Maternal uterus/adnexae:  Right ovary not seen. Normal sonographic appearance to the left  ovary, measuring 4.7 x 3.5 x 3.6 cm and containing a simple cyst  that measures 3.4 cm.  Endometrial thickness of approximately 1 cm. No endoluminal fluid.  There is a small intramural fibroid posteriorly measuring 1 cm.  IMPRESSION:  No intrauterine gestation.  Normal sonographic appearance to the left ovary. Right ovary not  identified.  No adnexal mass or free fluid identified.  Original Report Authenticated By: Jearld Lesch, M.D.   MAU Course  Procedures 1. Gc/ct 2. Wet prep 3. Cbc & quant hcg 4. Ob u/s <14 weeks 5. Toradol 60mg  IM x1  Assessment and Plan  1. [redacted]w[redacted]d 2. No pregnancy seen on u/s in uterus 3. SAB vs blighted ovum; unlikely normal pregancy 4. Rh  pos 5. Previous c/s 6. Obese 7. H/o  HTN 8. Quant less than expected for 8 weeks  1. Rev'd results w/ pt and support given.  Pt to come for bloodwork only on Mon at 1145 for repeat quant, and if dropped from today's value, will continue w/ weekly quants until <5. 2.  Bleeding precautions rev'd and given iron-rich diet.  Rec'd increasing fluid intake and change positions slowly 3. Given note to remain OOW until 12/10/12. Disc'd motrin and tylenol for pain.  F/u prn, worsening s/s, pain, fever. 4. Sent POC found on pad to pathology  Georgio Hattabaugh H 12/05/2012, 9:53 PM

## 2012-12-05 NOTE — MAU Note (Signed)
C/o bleeding and cramping since around 1600 today;

## 2012-12-06 LAB — GC/CHLAMYDIA PROBE AMP: CT Probe RNA: NEGATIVE

## 2012-12-09 ENCOUNTER — Other Ambulatory Visit: Payer: Self-pay

## 2012-12-09 DIAGNOSIS — O030 Genital tract and pelvic infection following incomplete spontaneous abortion: Secondary | ICD-10-CM

## 2012-12-10 ENCOUNTER — Telehealth: Payer: Self-pay

## 2012-12-10 ENCOUNTER — Telehealth: Payer: Self-pay | Admitting: Obstetrics and Gynecology

## 2012-12-10 NOTE — Telephone Encounter (Signed)
vph pt 

## 2012-12-10 NOTE — Telephone Encounter (Signed)
Grief card mailed for 12/05/12 loss. -Adrianne Pridgen

## 2012-12-11 NOTE — Telephone Encounter (Signed)
Triage.

## 2012-12-16 ENCOUNTER — Other Ambulatory Visit: Payer: Self-pay | Admitting: Obstetrics and Gynecology

## 2013-06-24 ENCOUNTER — Emergency Department (HOSPITAL_COMMUNITY)
Admission: EM | Admit: 2013-06-24 | Discharge: 2013-06-25 | Disposition: A | Discharge status: Home / Self Care | Payer: Self-pay | Attending: Emergency Medicine | Admitting: Emergency Medicine

## 2013-06-24 ENCOUNTER — Encounter (HOSPITAL_COMMUNITY): Payer: Self-pay | Admitting: Emergency Medicine

## 2013-06-24 DIAGNOSIS — M7989 Other specified soft tissue disorders: Secondary | ICD-10-CM | POA: Insufficient documentation

## 2013-06-24 DIAGNOSIS — I10 Essential (primary) hypertension: Secondary | ICD-10-CM | POA: Insufficient documentation

## 2013-06-24 DIAGNOSIS — Z8669 Personal history of other diseases of the nervous system and sense organs: Secondary | ICD-10-CM | POA: Insufficient documentation

## 2013-06-24 DIAGNOSIS — H53149 Visual discomfort, unspecified: Secondary | ICD-10-CM | POA: Insufficient documentation

## 2013-06-24 DIAGNOSIS — H538 Other visual disturbances: Secondary | ICD-10-CM | POA: Insufficient documentation

## 2013-06-24 DIAGNOSIS — R079 Chest pain, unspecified: Secondary | ICD-10-CM

## 2013-06-24 DIAGNOSIS — R519 Headache, unspecified: Secondary | ICD-10-CM

## 2013-06-24 DIAGNOSIS — E669 Obesity, unspecified: Secondary | ICD-10-CM | POA: Insufficient documentation

## 2013-06-24 DIAGNOSIS — R011 Cardiac murmur, unspecified: Secondary | ICD-10-CM | POA: Insufficient documentation

## 2013-06-24 DIAGNOSIS — R0602 Shortness of breath: Secondary | ICD-10-CM | POA: Insufficient documentation

## 2013-06-24 DIAGNOSIS — Z79899 Other long term (current) drug therapy: Secondary | ICD-10-CM | POA: Insufficient documentation

## 2013-06-24 DIAGNOSIS — R42 Dizziness and giddiness: Secondary | ICD-10-CM | POA: Insufficient documentation

## 2013-06-24 DIAGNOSIS — M25569 Pain in unspecified knee: Secondary | ICD-10-CM | POA: Insufficient documentation

## 2013-06-24 DIAGNOSIS — Z8619 Personal history of other infectious and parasitic diseases: Secondary | ICD-10-CM | POA: Insufficient documentation

## 2013-06-24 DIAGNOSIS — R51 Headache: Secondary | ICD-10-CM | POA: Insufficient documentation

## 2013-06-24 DIAGNOSIS — R0789 Other chest pain: Secondary | ICD-10-CM | POA: Insufficient documentation

## 2013-06-24 LAB — BASIC METABOLIC PANEL
BUN: 5 mg/dL — ABNORMAL LOW (ref 6–23)
CO2: 22 mEq/L (ref 19–32)
Calcium: 8.7 mg/dL (ref 8.4–10.5)
GFR calc Af Amer: >90 mL/min (ref >90)
GFR calc non Af Amer: >90 mL/min (ref >90)
Sodium: 134 mEq/L — ABNORMAL LOW (ref 135–145)

## 2013-06-24 LAB — CBC
HCT: 33.2 % — ABNORMAL LOW (ref 36.0–46.0)
MCH: 27.5 pg (ref 26.0–34.0)
MCV: 79.4 fL (ref 78.0–100.0)
Platelets: 248 10*3/uL (ref 150–400)
RBC: 4.18 MIL/uL (ref 3.87–5.11)

## 2013-06-24 LAB — POCT I-STAT TROPONIN I: Troponin i, poc: 0 ng/mL (ref 0.00–0.08)

## 2013-06-24 NOTE — ED Notes (Addendum)
Pt. reports left chest pain onset this evening with slight SOB and occasional dry cough , denies nausea or diaphoresis , pt. also reports headache and bilateral leg pain . Pt. added that she is 4 months pregnant ( G2P1 ) .

## 2013-06-25 ENCOUNTER — Emergency Department (HOSPITAL_COMMUNITY): Payer: Self-pay

## 2013-06-25 ENCOUNTER — Emergency Department (HOSPITAL_COMMUNITY): Payer: PRIVATE HEALTH INSURANCE

## 2013-06-25 ENCOUNTER — Encounter (HOSPITAL_COMMUNITY): Payer: Self-pay | Admitting: Radiology

## 2013-06-25 LAB — PRO B NATRIURETIC PEPTIDE: Pro B Natriuretic peptide (BNP): 13.7 pg/mL (ref 0–125)

## 2013-06-25 MED ORDER — SODIUM CHLORIDE 0.9 % IV BOLUS (SEPSIS)
1000.0000 mL | Freq: Once | INTRAVENOUS | Status: AC
  Administered 2013-06-25: 1000 mL via INTRAVENOUS

## 2013-06-25 MED ORDER — DIPHENHYDRAMINE HCL 50 MG/ML IJ SOLN
25.0000 mg | Freq: Once | INTRAMUSCULAR | Status: AC
  Administered 2013-06-25: 25 mg via INTRAVENOUS
  Filled 2013-06-25: qty 1

## 2013-06-25 MED ORDER — DEXAMETHASONE SODIUM PHOSPHATE 10 MG/ML IJ SOLN
10.0000 mg | Freq: Once | INTRAMUSCULAR | Status: AC
  Administered 2013-06-25: 10 mg via INTRAVENOUS
  Filled 2013-06-25: qty 1

## 2013-06-25 MED ORDER — METOCLOPRAMIDE HCL 5 MG/ML IJ SOLN
10.0000 mg | Freq: Once | INTRAMUSCULAR | Status: AC
  Administered 2013-06-25: 10 mg via INTRAVENOUS
  Filled 2013-06-25: qty 2

## 2013-06-25 MED ORDER — ACETAMINOPHEN 325 MG PO TABS
650.0000 mg | ORAL_TABLET | Freq: Once | ORAL | Status: AC
  Administered 2013-06-25: 650 mg via ORAL
  Filled 2013-06-25: qty 2

## 2013-06-25 MED ORDER — HYDROCODONE-ACETAMINOPHEN 5-325 MG PO TABS: 1.0000 | ORAL_TABLET | ORAL | Status: DC | PRN

## 2013-06-25 NOTE — ED Provider Notes (Signed)
Autumn [name] is a 27 y.o. female   Face-to-face evaluation   History: She presents for evaluation of headache. She is [redacted] weeks pregnant. She is followed by high risk clinic. She is taking Tylenol without relief.  Physical exam (0430): Alert, obese,  appeared comfortable. She exhibits normal range of motion of the head and neck  Results for orders placed during the hospital encounter of 06/24/13  CBC      Result Value Range   WBC 6.0  4.0 - 10.5 K/uL   RBC 4.18  3.87 - 5.11 MIL/uL   Hemoglobin 11.5 (*) 12.0 - 15.0 g/dL   HCT 16.1 (*) 09.6 - 04.5 %   MCV 79.4  78.0 - 100.0 fL   MCH 27.5  26.0 - 34.0 pg   MCHC 34.6  30.0 - 36.0 g/dL   RDW 40.9  81.1 - 91.4 %   Platelets 248  150 - 400 K/uL  BASIC METABOLIC PANEL      Result Value Range   Sodium 134 (*) 135 - 145 mEq/L   Potassium 3.8  3.5 - 5.1 mEq/L   Chloride 101  96 - 112 mEq/L   CO2 22  19 - 32 mEq/L   Glucose, Bld 84  70 - 99 mg/dL   BUN 5 (*) 6 - 23 mg/dL   Creatinine, Ser 7.82  0.50 - 1.10 mg/dL   Calcium 8.7  8.4 - 95.6 mg/dL   GFR calc non Af Amer >90  >90 mL/min   GFR calc Af Amer >90  >90 mL/min  PRO B NATRIURETIC PEPTIDE      Result Value Range   Pro B Natriuretic peptide (BNP) 13.7  0 - 125 pg/mL  D-DIMER, QUANTITATIVE      Result Value Range   D-Dimer, Quant <0.27  0.00 - 0.48 ug/mL-FEU  POCT I-STAT TROPONIN I      Result Value Range   Troponin i, poc 0.00  0.00 - 0.08 ng/mL   Comment 3             Ct Head Wo Contrast  06/25/2013   CLINICAL DATA:  Headache. Pregnant.  EXAM: CT HEAD WITHOUT CONTRAST  TECHNIQUE: Contiguous axial images were obtained from the base of the skull through the vertex without intravenous contrast.  COMPARISON:  None.  FINDINGS: No acute intracranial abnormality. Specifically, no hemorrhage, hydrocephalus, mass lesion, acute infarction, or significant intracranial injury. No acute calvarial abnormality. Visualized paranasal sinuses and mastoids clear. Orbital soft tissues  unremarkable.  IMPRESSION: Negative.   Electronically Signed   By: Caryn Bee Assessment: Nonspecific [place] M.D.   On: 06/25/2013 02:49   US Ob Limited  06/25/2013   CLINICAL DATA:  No fetal heart tones.  EXAM: LIMITED OBSTETRIC ULTRASOUND  FINDINGS: Number of Fetuses: 1  Heart Rate:  171 bpm  Movement: Yes  Presentation: Cephalic  Placental Location: Anterior  Previa: No  Amniotic Fluid (Subjective):  Within normal limits.  BPD:  2.8cm 15w 1d        EDC: 12/16/2013  MATERNAL FINDINGS:  Cervix:  Appears closed.  Uterus/Adnexae:  No abnormality visualized.  IMPRESSION: Approximately 15 week intrauterine gestation with fetal heart rate 171 beats per min. No acute maternal findings.  This exam is performed on an emergent basis and does not comprehensively evaluate fetal size, dating, or anatomy; follow-up complete OB US should be considered if further fetal assessment is warranted.   Electronically Signed   By: Charlett Nose M.D.   On: 06/25/2013 02:38  Medications  sodium chloride 0.9 % bolus 1,000 mL (1,000 mLs Intravenous New Bag/Given 06/25/13 0046)  acetaminophen (TYLENOL) tablet 650 mg (650 mg Oral Given 06/25/13 0131)  dexamethasone (DECADRON) injection 10 mg (10 mg Intravenous Given 06/25/13 0151)  metoCLOPramide (REGLAN) injection 10 mg (10 mg Intravenous Given 06/25/13 0151)  diphenhydrAMINE (BENADRYL) injection 25 mg (25 mg Intravenous Given 06/25/13 0151)    Patient Vitals for the past 24 hrs:  BP Temp Temp src Pulse Resp SpO2  06/25/13 0418 134/63 mmHg 98.6 F (37 C) Oral 98 18 100 %  06/25/13 0254 128/67 mmHg - - 102 18 99 %  06/24/13 2330 129/71 mmHg - - 93 26 100 %  06/24/13 2323 130/79 mmHg - - - 16 -  06/24/13 2249 146/80 mmHg 98.2 F (36.8 C) Oral 100 20 100 %      Assessment: Nonspecific symptoms; headache, chest pain, shortness of breath and leg pain. ED evaluation, is reassuring. Doubt eclampsia, acute intracranial bleeding, meningitis, pneumonia, ACS, PE, UTI, metabolic  instability. She is stable for discharge.  Nursing Notes Reviewed/ Care Coordinated, and agree without changes. Applicable Imaging Reviewed.  Interpretation of Laboratory Data incorporated into ED treatment   Plan: Home Medications- Norco; Home Treatments and Observation- rest, fluids; return here if the recommended treatment, does not improve the symptoms; Recommended follow up- follow up with obstetrician in 2 or 3 days for reassessment    Medical screening examination/treatment/procedure(s) were conducted as a shared visit with non-physician practitioner(s) and myself.  I personally evaluated the patient during the encounter  Flint Melter, MD 06/25/13 1058

## 2013-06-25 NOTE — ED Provider Notes (Signed)
CSN: [id number]     Arrival date & time 06/24/13  2238 History   First MD Initiated Contact with Patient 06/24/13 2340     Chief Complaint  Patient presents with  . Chest Pain   (Consider location/radiation/quality/duration/timing/severity/associated sxs/prior Treatment) The history is provided by the patient and medical records. No language interpreter was used.    Autumn [name] is a 27 y.o. female  with a hx of HTN with unknown pregnancy complications, "leaky heart valve" (followed by Dr Mayford Knife at [facility]) presents to the Emergency Department complaining of gradual, persistent, progressively worsening headache onset 1 week.  Pt is described as diffuse, throbbing, constant, nonradiating and associated with blurry vision and photophobia.  Pt has attempted tylenol, but it did not relieve the pain therefore she stopped taking it.  Pt rates her pain at a 10/10.  Pt also c/o bilateral leg swelling and pain of the entire lower limbs.  Pt reports this happens even when she is not pregnant.  Pt takes labetalol for her BP.  She reports her BP was 184/95 on Sunday but she did not have any other symptoms at that time.   Associated symptoms include dizziness, lightheadedness, chest pressure, shortness of breath.  Pt reports the chest pressure has been happening for some times.  Nothing makes her headache better and light makes it worse.  Pt denies fever, chills, neck pain, neck stiffness, abd pain, N/V/D, weakness, dysuria.       Past Medical History  Diagnosis Date  . Leaky heart valve   . Hypertension     stopped meds since pregnant  . Heart murmur   . H/O varicella   . Obese    Past Surgical History  Procedure Laterality Date  . No past surgeries    . Cesarean section  05/14/2012    Procedure: CESAREAN SECTION;  Surgeon: Hal Morales, MD;  Location: [facility];  Service: Gynecology;  Laterality: N/A;  Primary cesarean section of baby girl    at 0533 APGAR 8/9   Family History   Problem Relation Age of Onset  . Hypertension Mother   . Cancer Mother 58    breast  . Diabetes Sister   . Hypertension Sister   . Thyroid disease Sister   . Anemia Sister   . Anesthesia problems Neg Hx   . Heart disease Father    History  Substance Use Topics  . Smoking status: Never Smoker   . Smokeless tobacco: Never Used  . Alcohol Use: No   OB History   Grav Para Term Preterm Abortions TAB SAB Ect Mult Living   2 1 1  0 0 0 0 0 0 1     Review of Systems  Constitutional: Negative for fever, diaphoresis, appetite change, fatigue and unexpected weight change.  HENT: Negative for mouth sores and neck stiffness.   Eyes: Negative for visual disturbance.  Respiratory: Positive for chest tightness and shortness of breath. Negative for cough and wheezing.   Cardiovascular: Positive for chest pain.  Gastrointestinal: Negative for nausea, vomiting, abdominal pain, diarrhea and constipation.  Endocrine: Negative for polydipsia, polyphagia and polyuria.  Genitourinary: Negative for dysuria, urgency, frequency and hematuria.  Musculoskeletal: Positive for myalgias and arthralgias. Negative for back pain.  Skin: Negative for rash.  Allergic/Immunologic: Negative for immunocompromised state.  Neurological: Positive for headaches. Negative for syncope and light-headedness.  Hematological: Does not bruise/bleed easily.  Psychiatric/Behavioral: Negative for sleep disturbance. The patient is not nervous/anxious.     Allergies  Review of patient's allergies indicates no known allergies.  Home Medications   Current Outpatient Rx  Name  Route  Sig  Dispense  Refill  . labetalol (NORMODYNE) 200 MG tablet   Oral   Take 200 mg by mouth daily.         . Prenatal Vit-Fe Fumarate-FA (PRENATAL MULTIVITAMIN) TABS tablet   Oral   Take 1 tablet by mouth daily at 12 noon.          BP 129/71  Pulse 93  Temp(Src) 98.2 F (36.8 C) (Oral)  Resp 26  SpO2 100%  LMP 10/07/2012 Physical  Exam  Nursing note and vitals reviewed. Constitutional: She is oriented to person, place, and time. She appears well-developed and well-nourished. No distress.  Awake, alert, nontoxic appearance  HENT:  Head: Normocephalic and atraumatic.  Right Ear: Hearing, tympanic membrane, external ear and ear canal normal.  Left Ear: Hearing, tympanic membrane, external ear and ear canal normal.  Nose: Nose normal. No mucosal edema.  Mouth/Throat: Uvula is midline, oropharynx is clear and moist and mucous membranes are normal. Mucous membranes are not dry. No oropharyngeal exudate, posterior oropharyngeal edema, posterior oropharyngeal erythema or tonsillar abscesses.  Eyes: Conjunctivae and EOM are normal. Pupils are equal, round, and reactive to light. No scleral icterus.  Neck: Normal range of motion and full passive range of motion without pain. Neck supple. No spinous process tenderness and no muscular tenderness present. No rigidity. Normal range of motion present.  NO Nuchal rigidity  Cardiovascular: Normal rate, regular rhythm, normal heart sounds and intact distal pulses.   No audible murmur No tachycardia  Pulmonary/Chest: Effort normal and breath sounds normal. No respiratory distress. She has no wheezes. She has no rales.  Abdominal: Soft. Bowel sounds are normal. She exhibits no distension and no mass. There is no tenderness. There is no rebound and no guarding.  Obese  Musculoskeletal: Normal range of motion. She exhibits no edema.  No pain to palpation of the calf Negative Homans sign No pitting edema  Lymphadenopathy:    She has no cervical adenopathy.  Neurological: She is alert and oriented to person, place, and time. She has normal reflexes. No cranial nerve deficit. She exhibits normal muscle tone. Coordination normal.  Speech is clear and goal oriented, follows commands Cranial nerves III - XII without deficit, no facial droop Normal strength in upper and lower extremities  bilaterally, strong and equal grip strength Sensation normal to light and sharp touch Moves extremities without ataxia, coordination intact Normal finger to nose and rapid alternating movements Neg romberg, no pronator drift Normal gait Normal heel-shin and balance   Skin: Skin is warm and dry. No rash noted. She is not diaphoretic.  Psychiatric: She has a normal mood and affect. Her behavior is normal. Judgment and thought content normal.    ED Course  Procedures (including critical care time) Labs Review Labs Reviewed  CBC - Abnormal; Notable for the following:    Hemoglobin 11.5 (*)    HCT 33.2 (*)    All other components within normal limits  BASIC METABOLIC PANEL - Abnormal; Notable for the following:    Sodium 134 (*)    BUN 5 (*)    All other components within normal limits  PRO B NATRIURETIC PEPTIDE  D-DIMER, QUANTITATIVE  POCT I-STAT TROPONIN I   Imaging Review No results found.  ECG:  Date: 06/25/2013  Rate: 96  Rhythm: normal sinus rhythm  QRS Axis: normal  Intervals: normal  ST/T  Wave abnormalities: normal  Conduction Disutrbances:none  Narrative Interpretation: Nonischemic ECG, unchanged from 11/22/2011  Old EKG Reviewed: unchanged    MDM   1. Headache   2. Chest pain   3. SOB (shortness of breath)      Rexanne Mano presents with headache, chest pain, SOB and bilateral leg pain.    Patient without pitting edema, positive Homan's sign or pain to palpation of the legs.  Negative d-dime, negative troponin, nonischemic ECG. No concern for PE. CBC with mild anemia at 11.5, but at baseline for patient and I do not believe her symptoms are related to a symptomatic anemia.  BMP unremarkable and BNP unremarkable.  Patient given Tylenol without relief of her headache.  No fever or nuchal rigidity; low likelihood of meningitis.  We'll give migraine cocktail, fluid bolus and obtain head CT, as patient reports this is the worst headache of her  life.  Unable to obtain fetal heart tones due to patient's obesity.  Will obtain limited ultrasound to verify well-being of the fetus.  I discussed the plan at length with the patient including pending results and close followup if results are normal. I recommended followup with both her OB/GYN and cardiology this week.  Patient is amenable to this.  Dr. Effie Shy was consulted, evaluated this patient with me and agrees with the plan.  He will follow head CT results.     Dahlia Client Alphus Zeck, PA-C 06/25/13 0205

## 2013-06-25 NOTE — ED Notes (Signed)
Family at bedside. 

## 2013-07-04 ENCOUNTER — Other Ambulatory Visit (HOSPITAL_COMMUNITY): Payer: Self-pay | Admitting: Family Medicine

## 2013-07-04 ENCOUNTER — Ambulatory Visit (HOSPITAL_COMMUNITY)
Admission: RE | Admit: 2013-07-04 | Discharge: 2013-07-04 | Disposition: A | Discharge status: Home / Self Care | Payer: PRIVATE HEALTH INSURANCE | Source: Ambulatory Visit | Attending: Internal Medicine | Admitting: Internal Medicine

## 2013-07-04 DIAGNOSIS — M25569 Pain in unspecified knee: Secondary | ICD-10-CM

## 2013-07-04 DIAGNOSIS — M79609 Pain in unspecified limb: Secondary | ICD-10-CM | POA: Insufficient documentation

## 2013-07-04 DIAGNOSIS — M7989 Other specified soft tissue disorders: Secondary | ICD-10-CM

## 2013-07-04 NOTE — Progress Notes (Signed)
Bilateral Lower Ext. Venous Duplex Completed. Negative for DVT. Marilynne Halsted, BS, RDMS, RVT

## 2013-07-09 ENCOUNTER — Encounter: Payer: Self-pay | Admitting: Cardiology

## 2013-07-09 ENCOUNTER — Ambulatory Visit (INDEPENDENT_AMBULATORY_CARE_PROVIDER_SITE_OTHER): Payer: PRIVATE HEALTH INSURANCE | Admitting: Cardiology

## 2013-07-09 VITALS — BP 142/90 | HR 98 | Ht 64.0 in | Wt 323.0 lb

## 2013-07-09 DIAGNOSIS — R079 Chest pain, unspecified: Secondary | ICD-10-CM

## 2013-07-09 DIAGNOSIS — I10 Essential (primary) hypertension: Secondary | ICD-10-CM

## 2013-07-09 DIAGNOSIS — R002 Palpitations: Secondary | ICD-10-CM | POA: Insufficient documentation

## 2013-07-09 NOTE — Progress Notes (Signed)
[address] [place], [place]  [address] Phone: [phone] Fax:  [phone]  Date:  07/09/2013   ID:  Autumn [name], DOB 11/27/1985, MRN [id number]  PCP:  No primary provider on file.  Cardiologist:  Armanda Magic, MD    History of Present Illness: Autumn [name] is a 27 y.o. female with a history of normal coronary arteries by cath at age 82, atypical chest pain with recent normal ETT and normal EF on echo who presents back today for followup of her chest pain.  She continues to have palpitations and was supposed to get a heart monitor but she has not gotten the heart monitor yet.  She says that she will get SOB especially at night when the palpitations wake her up.  She rarely will have a tightness in her chest that lasts a few minutes and resolves on its own.  She does have some LE edema with leg pains.  She is now 4 months pregnant.  She had LE venous dopplers which showed on evidence of DVT.   Wt Readings from Last 3 Encounters:  07/09/13 323 lb (146.512 kg)  12/05/12 290 lb (131.543 kg)  07/19/12 308 lb (139.708 kg)     Past Medical History  Diagnosis Date  . Leaky heart valve   . Hypertension     stopped meds since pregnant  . Heart murmur   . H/O varicella   . Obese     Current Outpatient Prescriptions  Medication Sig Dispense Refill  . HYDROcodone-acetaminophen (NORCO) 5-325 MG per tablet Take 1 tablet by mouth every 4 (four) hours as needed for pain.  10 tablet  0  . labetalol (NORMODYNE) 200 MG tablet Take 200 mg by mouth daily.      . Prenatal Vit-Fe Fumarate-FA (PRENATAL MULTIVITAMIN) TABS tablet Take 1 tablet by mouth daily at 12 noon.       No current facility-administered medications for this visit.    Allergies:   No Known Allergies  Social History:  The patient  reports that she has never smoked. She has never used smokeless tobacco. She reports that she does not drink alcohol or use illicit drugs.   Family History:  The patient's  family history includes Anemia in her sister; Cancer (age of onset: 70) in her mother; Diabetes in her sister; Heart disease in her father; Hypertension in her mother and sister; Thyroid disease in her sister. There is no history of Anesthesia problems.   ROS:  Please see the history of present illness.      All other systems reviewed and negative.   PHYSICAL EXAM: VS:  BP 142/90  Pulse 98  Ht 5\' 4"  (1.626 m)  Wt 323 lb (146.512 kg)  BMI 55.42 kg/m2  SpO2 98%  LMP 10/07/2012 Well nourished, well developed, in no acute distress HEENT: normal Neck: no JVD Cardiac:  normal S1, S2; RRR; no murmur Lungs:  clear to auscultation bilaterally, no wheezing, rhonchi or rales Abd: soft, nontender, no hepatomegaly Ext: no edema Skin: warm and dry Neuro:  CNs 2-12 intact, no focal abnormalities noted  ASSESSMENT AND PLAN:  1. Palpitations  - lifewatch heart monitor to assess palpitations 2.  Atypical CP - I reassured her that given her young age and normal ETT recently that risk of underlying CAD causing CP is very low.  This could be GERD given her obesity and now pregnancy which can worsen GERD.  Her echo was normal as well. 3.  Obesity 4.  HTN - controlled  - continue Labetolol  followup with me in 6 weeks  Signed, Armanda Magic, MD 07/09/2013 11:34 AM

## 2013-07-09 NOTE — Patient Instructions (Signed)
Your physician recommends that you schedule a follow-up appointment in: 6 weeks with Dr. Mayford Knife  Your physician has recommended that you wear an event monitor. Event monitors are medical devices that record the heart's electrical activity. Doctors most often [place] these monitors to diagnose arrhythmias. Arrhythmias are problems with the speed or rhythm of the heartbeat. The monitor is a small, portable device. You can wear one while you do your normal daily activities. This is usually used to diagnose what is causing palpitations/syncope (passing out).   Your physician recommends that you continue on your current medications as directed. Please refer to the Current Medication list given to you today.

## 2013-07-16 ENCOUNTER — Encounter (INDEPENDENT_AMBULATORY_CARE_PROVIDER_SITE_OTHER): Payer: PRIVATE HEALTH INSURANCE

## 2013-07-16 ENCOUNTER — Encounter: Payer: Self-pay | Admitting: *Deleted

## 2013-07-16 DIAGNOSIS — R002 Palpitations: Secondary | ICD-10-CM

## 2013-07-16 NOTE — Progress Notes (Signed)
Patient ID: Autumn [name], female   DOB: 1985-10-29, 29 y.o.   MRN: [id number] [employer] 30 day cardiac event monitor applied to patient.

## 2013-08-19 ENCOUNTER — Telehealth: Payer: Self-pay | Admitting: Cardiology

## 2013-08-19 NOTE — Telephone Encounter (Signed)
Please let patient know that heart monitor showed normal rhythm with occasional extra heart beats from the top of the heart which are benign 

## 2013-08-20 ENCOUNTER — Encounter: Payer: Self-pay | Admitting: Cardiology

## 2013-08-20 ENCOUNTER — Ambulatory Visit (INDEPENDENT_AMBULATORY_CARE_PROVIDER_SITE_OTHER): Payer: PRIVATE HEALTH INSURANCE | Admitting: Cardiology

## 2013-08-20 VITALS — BP 154/89 | HR 89 | Ht 63.0 in | Wt 323.2 lb

## 2013-08-20 DIAGNOSIS — R079 Chest pain, unspecified: Secondary | ICD-10-CM

## 2013-08-20 DIAGNOSIS — R002 Palpitations: Secondary | ICD-10-CM

## 2013-08-20 DIAGNOSIS — I10 Essential (primary) hypertension: Secondary | ICD-10-CM

## 2013-08-20 NOTE — Patient Instructions (Signed)
Your physician recommends that you continue on your current medications as directed. Please refer to the Current Medication list given to you today.  Your physician recommends that you schedule a follow-up appointment in: 2 Months with Dr Turner  

## 2013-08-20 NOTE — Progress Notes (Signed)
  [address] [place], [place]  [address] Phone: [phone] Fax:  [phone]  Date:  08/20/2013   ID:  Autumn [name], DOB [birth year], MRN [id number]  PCP:  No primary provider on file.  Cardiologist:  Armanda Magic, MD     History of Present Illness: Autumn [name] is a 27 y.o. female with a history of HTN , obesity and normal cardiac cath in the past who saw me in June with complaints of CP.  She underwent ETT at that time which showed no ischemia.  She recently had palpitations and a heart monitor was ordered which showed NSR with PAC's.She rarely will have a tightness in her chest that lasts a few minutes and resolves on its own. She does have some LE edema with leg pains. She is now 5 months pregnant. She had LE venous dopplers which showed on evidence of DVT.    Wt Readings from Last 3 Encounters:  08/20/13 323 lb 3.2 oz (146.603 kg)  07/09/13 323 lb (146.512 kg)  12/05/12 290 lb (131.543 kg)     Past Medical History  Diagnosis Date  . Leaky heart valve     mild MR by echo  . Heart murmur   . H/O varicella   . Obese   . Hypertension     stopped meds since pregnant    Current Outpatient Prescriptions  Medication Sig Dispense Refill  . labetalol (NORMODYNE) 200 MG tablet Take 200 mg by mouth daily.      . Multiple Vitamins-Minerals (MULTIVITAMIN PO) Take by mouth. Multivitamin with mg and calcium      . Prenatal Vit-Fe Fumarate-FA (PRENATAL MULTIVITAMIN) TABS tablet Take 1 tablet by mouth daily at 12 noon.       No current facility-administered medications for this visit.    Allergies:   No Known Allergies  Social History:  The patient  reports that she has never smoked. She has never used smokeless tobacco. She reports that she does not drink alcohol or use illicit drugs.   Family History:  The patient's family history includes Anemia in her sister; Cancer (age of onset: 53) in her mother; Diabetes in her sister; Heart disease in her father;  Hypertension in her mother and sister; Thyroid disease in her sister. There is no history of Anesthesia problems.   ROS:  Please see the history of present illness.      All other systems reviewed and negative.   PHYSICAL EXAM: VS:  BP 154/89  Pulse 89  Ht 5\' 3"  (1.6 m)  Wt 323 lb 3.2 oz (146.603 kg)  BMI 57.27 kg/m2  LMP 10/07/2012 Well nourished, well developed, in no acute distress HEENT: normal Neck: no JVD Cardiac:  normal S1, S2; RRR; no murmur Lungs:  clear to auscultation bilaterally, no wheezing, rhonchi or rales Abd: soft, nontender, no hepatomegaly Ext: no edema Skin: warm and dry Neuro:  CNs 2-12 intact, no focal abnormalities noted      ASSESSMENT AND PLAN:  1. Palpitations with heart monitor showing NSR with occasional PAC's  - I reassured her that these were benign. 2. Atypical CP with normal ETT in June 3. HTN in setting of pregnancy.  She is to continue on her current medical regimen of Labetalol and she is going to see her OB GYN today for BP followup.  I will let her make any adjustments.  Followup with me in 2 months  Signed, Armanda Magic, MD 08/20/2013 10:51 AM

## 2013-08-20 NOTE — Telephone Encounter (Signed)
Spoke to pts husband asked him to have pt call me back.

## 2013-08-29 ENCOUNTER — Encounter: Payer: Self-pay | Admitting: General Surgery

## 2013-08-29 NOTE — Telephone Encounter (Signed)
Letter sent to make pt aware.

## 2013-10-02 ENCOUNTER — Encounter (HOSPITAL_COMMUNITY): Payer: Self-pay | Admitting: *Deleted

## 2013-10-02 ENCOUNTER — Inpatient Hospital Stay (HOSPITAL_COMMUNITY)
Admission: AD | Admit: 2013-10-02 | Discharge: 2013-10-02 | Disposition: A | Discharge status: Home / Self Care | Payer: PRIVATE HEALTH INSURANCE | Source: Ambulatory Visit | Attending: Obstetrics and Gynecology | Admitting: Obstetrics and Gynecology

## 2013-10-02 DIAGNOSIS — O99891 Other specified diseases and conditions complicating pregnancy: Secondary | ICD-10-CM | POA: Insufficient documentation

## 2013-10-02 DIAGNOSIS — O212 Late vomiting of pregnancy: Secondary | ICD-10-CM | POA: Insufficient documentation

## 2013-10-02 DIAGNOSIS — O9989 Other specified diseases and conditions complicating pregnancy, childbirth and the puerperium: Principal | ICD-10-CM

## 2013-10-02 DIAGNOSIS — R197 Diarrhea, unspecified: Secondary | ICD-10-CM | POA: Insufficient documentation

## 2013-10-02 DIAGNOSIS — R824 Acetonuria: Secondary | ICD-10-CM | POA: Insufficient documentation

## 2013-10-02 DIAGNOSIS — O10019 Pre-existing essential hypertension complicating pregnancy, unspecified trimester: Secondary | ICD-10-CM | POA: Insufficient documentation

## 2013-10-02 DIAGNOSIS — B9789 Other viral agents as the cause of diseases classified elsewhere: Secondary | ICD-10-CM | POA: Insufficient documentation

## 2013-10-02 DIAGNOSIS — E860 Dehydration: Secondary | ICD-10-CM | POA: Insufficient documentation

## 2013-10-02 DIAGNOSIS — E669 Obesity, unspecified: Secondary | ICD-10-CM | POA: Insufficient documentation

## 2013-10-02 DIAGNOSIS — O99210 Obesity complicating pregnancy, unspecified trimester: Secondary | ICD-10-CM | POA: Insufficient documentation

## 2013-10-02 LAB — BASIC METABOLIC PANEL
BUN: 4 mg/dL — ABNORMAL LOW (ref 6–23)
CHLORIDE: 101 meq/L (ref 96–112)
CO2: 22 meq/L (ref 19–32)
Calcium: 9.1 mg/dL (ref 8.4–10.5)
Creatinine, Ser: 0.56 mg/dL (ref 0.50–1.10)
GFR calc Af Amer: >90 mL/min (ref >90)
GFR calc non Af Amer: >90 mL/min (ref >90)
GLUCOSE: 99 mg/dL (ref 70–99)
POTASSIUM: 3.5 meq/L — AB (ref 3.7–5.3)
Sodium: 135 mEq/L — ABNORMAL LOW (ref 137–147)

## 2013-10-02 LAB — CBC WITH DIFFERENTIAL/PLATELET
Basophils Absolute: 0 10*3/uL (ref 0.0–0.1)
Basophils Relative: 0 % (ref 0–1)
Eosinophils Absolute: 0 10*3/uL (ref 0.0–0.7)
Eosinophils Relative: 0 % (ref 0–5)
HCT: 35.9 % — ABNORMAL LOW (ref 36.0–46.0)
HEMOGLOBIN: 12.1 g/dL (ref 12.0–15.0)
LYMPHS ABS: 1.2 10*3/uL (ref 0.7–4.0)
LYMPHS PCT: 17 % (ref 12–46)
MCH: 27.1 pg (ref 26.0–34.0)
MCHC: 33.7 g/dL (ref 30.0–36.0)
MCV: 80.5 fL (ref 78.0–100.0)
MONOS PCT: 11 % (ref 3–12)
Monocytes Absolute: 0.7 10*3/uL (ref 0.1–1.0)
NEUTROS PCT: 72 % (ref 43–77)
Neutro Abs: 4.9 10*3/uL (ref 1.7–7.7)
Platelets: 262 10*3/uL (ref 150–400)
RBC: 4.46 MIL/uL (ref 3.87–5.11)
RDW: 14.3 % (ref 11.5–15.5)
WBC: 6.9 10*3/uL (ref 4.0–10.5)

## 2013-10-02 LAB — URINALYSIS, ROUTINE W REFLEX MICROSCOPIC
Glucose, UA: NEGATIVE mg/dL
Hgb urine dipstick: NEGATIVE
Ketones, ur: >80 mg/dL — AB
LEUKOCYTES UA: NEGATIVE
Nitrite: NEGATIVE
PH: 6 (ref 5.0–8.0)
Protein, ur: 30 mg/dL — AB
UROBILINOGEN UA: 4 mg/dL — AB (ref 0.0–1.0)

## 2013-10-02 LAB — URINE MICROSCOPIC-ADD ON

## 2013-10-02 MED ORDER — TRAMADOL HCL 50 MG PO TABS: 50.0000 mg | ORAL_TABLET | Freq: Four times a day (QID) | ORAL | Status: DC | PRN

## 2013-10-02 MED ORDER — OSELTAMIVIR PHOSPHATE 75 MG PO CAPS: 75.0000 mg | ORAL_CAPSULE | Freq: Two times a day (BID) | ORAL | Status: DC

## 2013-10-02 MED ORDER — KCL-LACTATED RINGERS-D5W 20 MEQ/L IV SOLN: INTRAVENOUS | Status: DC

## 2013-10-02 MED ORDER — ONDANSETRON 8 MG PO TBDP
8.0000 mg | ORAL_TABLET | Freq: Once | ORAL | Status: AC
  Administered 2013-10-02: 8 mg via ORAL
  Filled 2013-10-02: qty 1

## 2013-10-02 MED ORDER — POTASSIUM CHLORIDE 2 MEQ/ML IV SOLN
Freq: Once | INTRAVENOUS | Status: AC
  Administered 2013-10-02: 17:00:00 via INTRAVENOUS
  Filled 2013-10-02: qty 1000

## 2013-10-02 MED ORDER — ONDANSETRON 8 MG PO TBDP: 8.0000 mg | ORAL_TABLET | Freq: Three times a day (TID) | ORAL | Status: DC | PRN

## 2013-10-02 MED ORDER — ACETAMINOPHEN 325 MG PO TABS: 650.0000 mg | ORAL_TABLET | Freq: Once | ORAL | Status: DC

## 2013-10-02 MED ORDER — DEXTROSE 5 % IN LACTATED RINGERS IV BOLUS
500.0000 mL | Freq: Once | INTRAVENOUS | Status: AC
  Administered 2013-10-02: 16:00:00 via INTRAVENOUS

## 2013-10-02 MED ORDER — ACETAMINOPHEN 325 MG PO TABS
650.0000 mg | ORAL_TABLET | Freq: Once | ORAL | Status: AC
  Administered 2013-10-02: 650 mg via ORAL
  Filled 2013-10-02: qty 2

## 2013-10-02 NOTE — MAU Note (Signed)
Pt c/o N/V/D since yesterday. Not sure if she had a fever did not check her temp.

## 2013-10-02 NOTE — MAU Provider Note (Signed)
DATE: 10/02/2013  Maternity Admissions Unit History and Physical Exam for an Obstetrics Patient  Autumn [name] is a 28 y.o. female, G3P1011, at 6752w3d gestation, who presents for nausea, vomiting, and diarrhea. [facility].  She works in a [employer]ational [employer]care center. Her symptoms started yesterday but got worse today. She feels very weak. See history below. The patient denies vaginal bleeding, contractions, and leakage of fluid.  OB History   Grav Para Term Preterm Abortions TAB SAB Ect Mult Living   3 1 1  0 1 0 1 0 0 1      Past Medical History  Diagnosis Date  . Leaky heart valve     mild MR by echo  . Heart murmur   . H/O varicella   . Obese   . Hypertension     stopped meds since pregnant    Prescriptions prior to admission  Medication Sig Dispense Refill  . HYDROcodone-acetaminophen (NORCO/VICODIN) 5-325 MG per tablet Take 1 tablet by mouth every 6 (six) hours as needed for moderate pain.      Marland Kitchen. labetalol (NORMODYNE) 200 MG tablet Take 200 mg by mouth 4 (four) times daily.       . Multiple Vitamins-Minerals (MULTIVITAMIN PO) Take by mouth. Multivitamin with mg and calcium      . Prenatal Vit-Fe Fumarate-FA (PRENATAL MULTIVITAMIN) TABS tablet Take 1 tablet by mouth daily at 12 noon.        Past Surgical History  Procedure Laterality Date  . Cesarean section  05/14/2012    Procedure: CESAREAN SECTION;  Surgeon: Hal MoralesVanessa P Haygood, MD;  Location: [facility];  Service: Gynecology;  Laterality: N/A;  Primary cesarean section of baby girl    at 0533 APGAR 8/9    No Known Allergies  Family History: family history includes Anemia in her sister; Cancer (age of onset: 2050) in her mother; Diabetes in her sister; Heart disease in her father; Hypertension in her mother and sister; Thyroid disease in her sister. There is no history of Anesthesia problems.  Social History:  reports that she has never  smoked. She has never used smokeless tobacco. She reports that she does not drink alcohol or use illicit drugs.  Review of systems: Normal pregnancy complaints.  Admission Physical Exam:    Body mass index is 52.67 kg/(m^2).  Blood pressure 146/83, pulse 101, temperature 98.5 F (36.9 C), temperature source Oral, resp. rate 18, height 5\' 5"  (1.651 m), weight 316 lb 8 oz (143.563 kg).  HEENT:                 Within normal limits Chest:                   Clear Heart:                    Regular rate and rhythm Abdomen:             Gravid and nontender Extremities:          Grossly normal Neurologic exam: Grossly normal Pelvic exam:         Deferred  NST: Category 1; Contractions: None .   Results for orders placed during the hospital encounter of 10/02/13 (from the past 24 hour(s))  URINALYSIS, ROUTINE W REFLEX MICROSCOPIC     Status: Abnormal   Collection Time    10/02/13  1:26 PM  Result Value Range   Color, Urine ORANGE (*) YELLOW   APPearance CLEAR  CLEAR   Specific Gravity, Urine >1.030 (*) 1.005 - 1.030   pH 6.0  5.0 - 8.0   Glucose, UA NEGATIVE  NEGATIVE mg/dL   Hgb urine dipstick NEGATIVE  NEGATIVE   Bilirubin Urine MODERATE (*) NEGATIVE   Ketones, ur >80 (*) NEGATIVE mg/dL   Protein, ur 30 (*) NEGATIVE mg/dL   Urobilinogen, UA 4.0 (*) 0.0 - 1.0 mg/dL   Nitrite NEGATIVE  NEGATIVE   Leukocytes, UA NEGATIVE  NEGATIVE  URINE MICROSCOPIC-ADD ON     Status: Abnormal   Collection Time    10/02/13  1:26 PM      Result Value Range   Squamous Epithelial / LPF FEW (*) RARE   WBC, UA 0-2  <3 WBC/hpf   Urine-Other MUCOUS PRESENT    CBC WITH DIFFERENTIAL     Status: Abnormal   Collection Time    10/02/13  2:37 PM      Result Value Range   WBC 6.9  4.0 - 10.5 K/uL   RBC 4.46  3.87 - 5.11 MIL/uL   Hemoglobin 12.1  12.0 - 15.0 g/dL   HCT 16.1 (*) 09.6 - 04.5 %   MCV 80.5  78.0 - 100.0 fL   MCH 27.1  26.0 - 34.0 pg   MCHC 33.7  30.0 - 36.0 g/dL   RDW 40.9  81.1 - 91.4  %   Platelets 262  150 - 400 K/uL   Neutrophils Relative % 72  43 - 77 %   Neutro Abs 4.9  1.7 - 7.7 K/uL   Lymphocytes Relative 17  12 - 46 %   Lymphs Abs 1.2  0.7 - 4.0 K/uL   Monocytes Relative 11  3 - 12 %   Monocytes Absolute 0.7  0.1 - 1.0 K/uL   Eosinophils Relative 0  0 - 5 %   Eosinophils Absolute 0.0  0.0 - 0.7 K/uL   Basophils Relative 0  0 - 1 %   Basophils Absolute 0.0  0.0 - 0.1 K/uL  BASIC METABOLIC PANEL     Status: Abnormal   Collection Time    10/02/13  2:37 PM      Result Value Range   Sodium 135 (*) 137 - 147 mEq/L   Potassium 3.5 (*) 3.7 - 5.3 mEq/L   Chloride 101  96 - 112 mEq/L   CO2 22  19 - 32 mEq/L   Glucose, Bld 99  70 - 99 mg/dL   BUN 4 (*) 6 - 23 mg/dL   Creatinine, Ser 7.82  0.50 - 1.10 mg/dL   Calcium 9.1  8.4 - 95.6 mg/dL   GFR calc non Af Amer >90  >90 mL/min   GFR calc Af Amer >90  >90 mL/min     Assessment:  [redacted]w[redacted]d gestation  Nausea, vomiting, and diarrhea.  Probable viral syndrome.  Obesity  Hypertension  Dehydration  Ketonuria  Hypokalemia  Plan:  We will hydrate the patient with IV fluids. We will replace her potassium. We will give the patient antiemetics. If the patient feels better, then she will be discharged to home. If she does not feel better, then she will be observed overnight in the hospital.   Janine Limbo 10/02/2013, 4:01 PM

## 2013-10-02 NOTE — Discharge Summary (Signed)
[facility]  Autumn ManoJacinta L [name]  DOB:    06/15/1986 MRN:    [id number] CSN:    [id number]  Date of admission:                  10/02/2013  Date of discharge:                   10/02/2013  Admission Diagnosis:  10674w3d  Nausea, vomiting, diarrhea  Viral syndrome  Hypertension  Obesity  Dehydration  Ketonuria  Discharge Diagnosis:  9674w3d  Nausea, vomiting, diarrhea  Viral syndrome  Hypertension  Obesity  Dehydration  Ketonuria  Procedures this admission:  IV fluids  History of Present Illness:  Ms. Autumn [name] is a 28 y.o. female, G3P1011, who presents at 2774w3d weeks gestation. [facility]. She was observed for the above symptoms.  Hospital course:  Results for orders placed during the hospital encounter of 10/02/13 (from the past 24 hour(s))  URINALYSIS, ROUTINE W REFLEX MICROSCOPIC     Status: Abnormal   Collection Time    10/02/13  1:26 PM      Result Value Range   Color, Urine ORANGE (*) YELLOW   APPearance CLEAR  CLEAR   Specific Gravity, Urine >1.030 (*) 1.005 - 1.030   pH 6.0  5.0 - 8.0   Glucose, UA NEGATIVE  NEGATIVE mg/dL   Hgb urine dipstick NEGATIVE  NEGATIVE   Bilirubin Urine MODERATE (*) NEGATIVE   Ketones, ur >80 (*) NEGATIVE mg/dL   Protein, ur 30 (*) NEGATIVE mg/dL   Urobilinogen, UA 4.0 (*) 0.0 - 1.0 mg/dL   Nitrite NEGATIVE  NEGATIVE   Leukocytes, UA NEGATIVE  NEGATIVE  URINE MICROSCOPIC-ADD ON     Status: Abnormal   Collection Time    10/02/13  1:26 PM      Result Value Range   Squamous Epithelial / LPF FEW (*) RARE   WBC, UA 0-2  <3 WBC/hpf   Urine-Other MUCOUS PRESENT    CBC WITH DIFFERENTIAL     Status: Abnormal   Collection Time    10/02/13  2:37 PM      Result Value Range   WBC 6.9  4.0 - 10.5 K/uL   RBC 4.46  3.87 - 5.11 MIL/uL   Hemoglobin  12.1  12.0 - 15.0 g/dL   HCT 86.535.9 (*) 78.436.0 - 69.646.0 %   MCV 80.5  78.0 - 100.0 fL   MCH 27.1  26.0 - 34.0 pg   MCHC 33.7  30.0 - 36.0 g/dL   RDW 29.514.3  28.411.5 - 13.215.5 %   Platelets 262  150 - 400 K/uL   Neutrophils Relative % 72  43 - 77 %   Neutro Abs 4.9  1.7 - 7.7 K/uL   Lymphocytes Relative 17  12 - 46 %   Lymphs Abs 1.2  0.7 - 4.0 K/uL   Monocytes Relative 11  3 - 12 %   Monocytes Absolute 0.7  0.1 - 1.0 K/uL   Eosinophils Relative 0  0 - 5 %   Eosinophils Absolute 0.0  0.0 - 0.7 K/uL   Basophils Relative 0  0 - 1 %   Basophils Absolute 0.0  0.0 - 0.1 K/uL  BASIC METABOLIC PANEL     Status: Abnormal   Collection Time    10/02/13  2:37 PM  Result Value Range   Sodium 135 (*) 137 - 147 mEq/L   Potassium 3.5 (*) 3.7 - 5.3 mEq/L   Chloride 101  96 - 112 mEq/L   CO2 22  19 - 32 mEq/L   Glucose, Bld 99  70 - 99 mg/dL   BUN 4 (*) 6 - 23 mg/dL   Creatinine, Ser 7.82  0.50 - 1.10 mg/dL   Calcium 9.1  8.4 - 95.6 mg/dL   GFR calc non Af Amer >90  >90 mL/min   GFR calc Af Amer >90  >90 mL/min     The patient was given IV fluids and Zofran.   She did feel better after 2 L. She was given Tylenol for her headache. She was discharged to home doing well.  Discharge Physical Exam:  BP 140/79  Pulse 85  Temp(Src) 98.5 F (36.9 C) (Oral)  Resp 18  Ht 5\' 5"  (1.651 m)  Wt 316 lb 8 oz (143.563 kg)  BMI 52.67 kg/m2   Discharge Information:  Activity:           unrestricted Diet:                Bland diet until symptoms resolve. Medications: Zofran, Tamiflu 75 mg twice a day for 5 days, Ultram for headaches. Condition:      stable and improved Instructions:  The patient was given information about influenza. Kick counts were instructed. Discharge to: home  Follow-up Information   Follow up with [facility] In 1 week.   Contact information:   [address] [place] [address]        Autumn [name] 10/02/2013

## 2013-10-02 NOTE — Discharge Instructions (Signed)
Fetal Movement Counts °Patient Name: __________________________________________________ Patient Due Date: ____________________ °Performing a fetal movement count is highly recommended in high-risk pregnancies, but it is good for every pregnant woman to do. Your caregiver may ask you to start counting fetal movements at 28 weeks of the pregnancy. Fetal movements often increase: °· After eating a full meal. °· After physical activity. °· After eating or drinking something sweet or cold. °· At rest. °Pay attention to when you feel the baby is most active. This will help you notice a pattern of your baby's sleep and wake cycles and what factors contribute to an increase in fetal movement. It is important to perform a fetal movement count at the same time each day when your baby is normally most active.  °HOW TO COUNT FETAL MOVEMENTS °1. Find a quiet and comfortable area to sit or lie down on your left side. Lying on your left side provides the best blood and oxygen circulation to your baby. °2. Write down the day and time on a sheet of paper or in a journal. °3. Start counting kicks, flutters, swishes, rolls, or jabs in a 2 hour period. You should feel at least 10 movements within 2 hours. °4. If you do not feel 10 movements in 2 hours, wait 2 3 hours and count again. Look for a change in the pattern or not enough counts in 2 hours. °SEEK MEDICAL CARE IF: °· You feel less than 10 counts in 2 hours, tried twice. °· There is no movement in over an hour. °· The pattern is changing or taking longer each day to reach 10 counts in 2 hours. °· You feel the baby is not moving as he or she usually does. °Date: ____________ Movements: ____________ Start time: ____________ Finish time: ____________  °Date: ____________ Movements: ____________ Start time: ____________ Finish time: ____________ °Date: ____________ Movements: ____________ Start time: ____________ Finish time: ____________ °Date: ____________ Movements: ____________  Start time: ____________ Finish time: ____________ °Date: ____________ Movements: ____________ Start time: ____________ Finish time: ____________ °Date: ____________ Movements: ____________ Start time: ____________ Finish time: ____________ °Date: ____________ Movements: ____________ Start time: ____________ Finish time: ____________ °Date: ____________ Movements: ____________ Start time: ____________ Finish time: ____________  °Date: ____________ Movements: ____________ Start time: ____________ Finish time: ____________ °Date: ____________ Movements: ____________ Start time: ____________ Finish time: ____________ °Date: ____________ Movements: ____________ Start time: ____________ Finish time: ____________ °Date: ____________ Movements: ____________ Start time: ____________ Finish time: ____________ °Date: ____________ Movements: ____________ Start time: ____________ Finish time: ____________ °Date: ____________ Movements: ____________ Start time: ____________ Finish time: ____________ °Date: ____________ Movements: ____________ Start time: ____________ Finish time: ____________  °Date: ____________ Movements: ____________ Start time: ____________ Finish time: ____________ °Date: ____________ Movements: ____________ Start time: ____________ Finish time: ____________ °Date: ____________ Movements: ____________ Start time: ____________ Finish time: ____________ °Date: ____________ Movements: ____________ Start time: ____________ Finish time: ____________ °Date: ____________ Movements: ____________ Start time: ____________ Finish time: ____________ °Date: ____________ Movements: ____________ Start time: ____________ Finish time: ____________ °Date: ____________ Movements: ____________ Start time: ____________ Finish time: ____________  °Date: ____________ Movements: ____________ Start time: ____________ Finish time: ____________ °Date: ____________ Movements: ____________ Start time: ____________ Finish time:  ____________ °Date: ____________ Movements: ____________ Start time: ____________ Finish time: ____________ °Date: ____________ Movements: ____________ Start time: ____________ Finish time: ____________ °Date: ____________ Movements: ____________ Start time: ____________ Finish time: ____________ °Date: ____________ Movements: ____________ Start time: ____________ Finish time: ____________ °Date: ____________ Movements: ____________ Start time: ____________ Finish time: ____________  °Date: ____________ Movements: ____________ Start time: ____________ Finish   time: ____________ Date: ____________ Movements: ____________ Start time: ____________ Doreatha MartinFinish time: ____________ Date: ____________ Movements: ____________ Start time: ____________ Doreatha MartinFinish time: ____________ Date: ____________ Movements: ____________ Start time: ____________ Doreatha MartinFinish time: ____________ Date: ____________ Movements: ____________ Start time: ____________ Doreatha MartinFinish time: ____________ Date: ____________ Movements: ____________ Start time: ____________ Doreatha MartinFinish time: ____________ Date: ____________ Movements: ____________ Start time: ____________ Doreatha MartinFinish time: ____________  Date: ____________ Movements: ____________ Start time: ____________ Doreatha MartinFinish time: ____________ Date: ____________ Movements: ____________ Start time: ____________ Doreatha MartinFinish time: ____________ Date: ____________ Movements: ____________ Start time: ____________ Doreatha MartinFinish time: ____________ Date: ____________ Movements: ____________ Start time: ____________ Doreatha MartinFinish time: ____________ Date: ____________ Movements: ____________ Start time: ____________ Doreatha MartinFinish time: ____________ Date: ____________ Movements: ____________ Start time: ____________ Doreatha MartinFinish time: ____________ Date: ____________ Movements: ____________ Start time: ____________ Doreatha MartinFinish time: ____________  Date: ____________ Movements: ____________ Start time: ____________ Doreatha MartinFinish time: ____________ Date: ____________ Movements:  ____________ Start time: ____________ Doreatha MartinFinish time: ____________ Date: ____________ Movements: ____________ Start time: ____________ Doreatha MartinFinish time: ____________ Date: ____________ Movements: ____________ Start time: ____________ Doreatha MartinFinish time: ____________ Date: ____________ Movements: ____________ Start time: ____________ Doreatha MartinFinish time: ____________ Date: ____________ Movements: ____________ Start time: ____________ Doreatha MartinFinish time: ____________ Date: ____________ Movements: ____________ Start time: ____________ Doreatha MartinFinish time: ____________  Date: ____________ Movements: ____________ Start time: ____________ Doreatha MartinFinish time: ____________ Date: ____________ Movements: ____________ Start time: ____________ Doreatha MartinFinish time: ____________ Date: ____________ Movements: ____________ Start time: ____________ Doreatha MartinFinish time: ____________ Date: ____________ Movements: ____________ Start time: ____________ Doreatha MartinFinish time: ____________ Date: ____________ Movements: ____________ Start time: ____________ Doreatha MartinFinish time: ____________ Date: ____________ Movements: ____________ Start time: ____________ Doreatha MartinFinish time: ____________ Document Released: 10/11/2006 Document Revised: 08/28/2012 Document Reviewed: 07/08/2012 [employer] Patient Information 2014 [employer] Influenza A (H1N1) in Pregnancy H1N1 formerly called "swine flu" is a new influenza virus causing sickness in people. The H1N1 virus is different from seasonal influenza viruses. However, the H1N1 symptoms are similar to seasonal influenza, and it is easily spread from person to person.  Pregnancy weakens the immune system, making it easier to catch infections and the H1N1 virus. Also, as the baby grows, the mother has less lung function. When there is less lung function, the mother is more likely to suffer from pneumonia, have kidney failure or a blood clot to the lung if they catch the flu. Currently, a vaccine is being produced to protect people from getting the H1N1 flu. It is safe  for the mother and fetus. Pregnant women should not take the nasal mist vaccine because the spray contains the live virus, even though it's strength is weakened (attenuated). If you are pregnant and think you have H1N1, call your caregiver right away. The CDC and the [employer] are following reported cases around the world.  CAUSES  The flu is thought to spread mainly person-to-person through coughing or sneezing of infected people.  A person may become infected by touching something with the virus on it and then touching their mouth or nose. SYMPTOMS   Fever.  Headache.  Tiredness.  Cough.  Sore throat.  Runny or stuffy nose.  Body aches.  Diarrhea and vomiting These symptoms are referred to as "flu-like symptoms." A lot of different illnesses, including the common cold, may have similar symptoms. DIAGNOSIS   There are tests that can tell if you have the H1N1 virus.  Confirmed cases of H1N1 will be reported to the state or local health department.  A doctor's exam may be needed to tell whether you have an infection that is a complication of the flu. TREATMENT   Start treatment as  soon as possible when symptoms occur.  Get a lot of sleep and rest.  Drink a lot of fluids.  Eat a balanced diet.  Take your vitamins and mineral supplements as recommended.  Only take over-the-counter or prescription medicines as directed by your caregiver.  Take medication, Tamiflu or Relenza that help fight the flu with the permission of your caregiver. They are safe to take when pregnant. PREVENTION   Cover your nose and mouth with a tissue or your arm when you cough or sneeze. Throw the tissue away.  Wash your hands often with soap and warm water, especially after you cough or sneeze. Alcohol-based cleaners are also effective against germs.  Avoid touching your eyes, nose or mouth. This is one way germs spread.  Try to avoid contact with sick people. Follow public  health advice regarding school closures. Avoid crowds.  Stay home if you get sick. Limit contact with others to keep from infecting them. People infected with the H1N1 virus may be able to infect others anywhere from 1 day before feeling sick to 5-7 days after getting flu symptoms.  An H1N1 vaccine is available to help protect against the virus. In addition to the H1N1 vaccine, you will need to be vaccinated for seasonal influenza. The H1N1 and seasonal vaccines may be given on the same day. FACEMASKS In community and home settings, the use of facemasks and N95 respirators are not normally recommended. In certain circumstances, a facemask or N95 respirator may be used for persons at increased risk of severe illness from influenza. Your caregiver can give additional recommendations for facemask use. HOME CARE INSTRUCTIONS   Stay informed. Visit the [facility] website for current recommendations. Visit [employer]. You may also call 1-800-CDC-INFO ((331)664-6518).  If you are pregnant, talk to your caregiver as soon as you develop flu-like symptoms.  If you get the flu, get plenty of rest, drink enough water and fluids to keep your urine clear or pale yellow, and avoid using alcohol or tobacco.  You may take over-the-counter medicine to relieve the symptoms of the flu if your caregiver approves.  Avoid mingling in large crowds and crowed places.  Wash your hands with soap and warm water especially after coughing or sneezing.  Cover you face when coughing or sneezing.  Wear a mask and stay at a good distance if someone in your family has the swine flu.  Avoid touching your eyes, nose and mouth.  Avoid people who are sick.  Stay home from work or school if you are getting sick. SEEK MEDICAL CARE IF:   You feel like you are getting the flu, see the symptoms stated above.  If you develop a temperature a fever with or without chills.  You think someone in your family is getting the  flu.  You have come in contact with someone who has the swine flu.  You want advice on what medications are safe to take or you need a prescription for medication. SEEK IMMEDIATE MEDICAL CARE IF:   Your flu-like symptoms improve but return with fever and worse cough.  You develop a temperature of 102 F (38.9 C) or higher.  You are short of breath or have a hard time breathing.  You have vaginal bleeding.  You do not feel the baby moving or the baby is moving less than usual.  You develop uterine contractions.  You have leaking or a gush of fluid from the vagina.  You develop severe or persistent vomiting.  You feel  dizzy, confused or turn bluish in color.  You have pain or pressure in the chest or abdomen. Document Released: 07/14/2008 Document Revised: 12/04/2011 Document Reviewed: 07/14/2008 [facility] Patient Information 2014 [place], [place].

## 2013-10-02 NOTE — MAU Note (Signed)
PT  SAYS SHE STARTED VOMITING  AT  12 NOON- A LOT.   DID NOT  CALL OFFICE.  TODAY- VOMITED X4-.   STARTED DIARRHEA- YESTERDAY AM.  NO FEVER, NO SORETHROAT,  COUGH- SOME MUCUS- SAYS   NOSE IS CONGESTION.     LAST SEEN IN OFFICE - VE  CLOSED AND SOFT.  DENIES HSV AND MRSA.

## 2013-10-10 ENCOUNTER — Encounter (HOSPITAL_COMMUNITY): Payer: Self-pay | Admitting: *Deleted

## 2013-10-22 ENCOUNTER — Ambulatory Visit: Payer: PRIVATE HEALTH INSURANCE | Admitting: Cardiology

## 2013-11-19 ENCOUNTER — Other Ambulatory Visit: Payer: Self-pay | Admitting: Obstetrics and Gynecology

## 2013-11-19 ENCOUNTER — Ambulatory Visit: Payer: PRIVATE HEALTH INSURANCE | Admitting: Cardiology

## 2013-11-19 LAB — OB RESULTS CONSOLE GBS: STREP GROUP B AG: POSITIVE

## 2013-11-28 ENCOUNTER — Inpatient Hospital Stay (HOSPITAL_COMMUNITY)
Admission: AD | Admit: 2013-11-28 | Discharge: 2013-12-01 | DRG: 766 | Disposition: A | Discharge status: Home / Self Care | Payer: PRIVATE HEALTH INSURANCE | Source: Ambulatory Visit | Attending: Obstetrics and Gynecology | Admitting: Obstetrics and Gynecology

## 2013-11-28 ENCOUNTER — Encounter (HOSPITAL_COMMUNITY): Payer: Self-pay | Admitting: *Deleted

## 2013-11-28 ENCOUNTER — Other Ambulatory Visit: Payer: Self-pay | Admitting: Family Medicine

## 2013-11-28 ENCOUNTER — Inpatient Hospital Stay (HOSPITAL_COMMUNITY): Payer: PRIVATE HEALTH INSURANCE | Admitting: Registered Nurse

## 2013-11-28 ENCOUNTER — Encounter (HOSPITAL_COMMUNITY): Payer: PRIVATE HEALTH INSURANCE | Admitting: Registered Nurse

## 2013-11-28 ENCOUNTER — Encounter (HOSPITAL_COMMUNITY)
Admission: AD | Disposition: A | Discharge status: Home / Self Care | Payer: Self-pay | Source: Ambulatory Visit | Attending: Obstetrics and Gynecology

## 2013-11-28 DIAGNOSIS — Z22330 Carrier of Group B streptococcus: Secondary | ICD-10-CM

## 2013-11-28 DIAGNOSIS — R519 Headache, unspecified: Secondary | ICD-10-CM | POA: Diagnosis not present

## 2013-11-28 DIAGNOSIS — O99892 Other specified diseases and conditions complicating childbirth: Secondary | ICD-10-CM | POA: Diagnosis present

## 2013-11-28 DIAGNOSIS — IMO0002 Reserved for concepts with insufficient information to code with codable children: Principal | ICD-10-CM | POA: Diagnosis present

## 2013-11-28 DIAGNOSIS — O99214 Obesity complicating childbirth: Secondary | ICD-10-CM

## 2013-11-28 DIAGNOSIS — O9989 Other specified diseases and conditions complicating pregnancy, childbirth and the puerperium: Secondary | ICD-10-CM

## 2013-11-28 DIAGNOSIS — I10 Essential (primary) hypertension: Secondary | ICD-10-CM | POA: Diagnosis present

## 2013-11-28 DIAGNOSIS — I38 Endocarditis, valve unspecified: Secondary | ICD-10-CM

## 2013-11-28 DIAGNOSIS — B951 Streptococcus, group B, as the cause of diseases classified elsewhere: Secondary | ICD-10-CM | POA: Diagnosis present

## 2013-11-28 DIAGNOSIS — O119 Pre-existing hypertension with pre-eclampsia, unspecified trimester: Secondary | ICD-10-CM | POA: Diagnosis present

## 2013-11-28 DIAGNOSIS — E669 Obesity, unspecified: Secondary | ICD-10-CM | POA: Diagnosis present

## 2013-11-28 DIAGNOSIS — G8929 Other chronic pain: Secondary | ICD-10-CM | POA: Diagnosis not present

## 2013-11-28 DIAGNOSIS — R51 Headache: Secondary | ICD-10-CM

## 2013-11-28 HISTORY — PX: BILATERAL SALPINGECTOMY: SHX5743

## 2013-11-28 LAB — COMPREHENSIVE METABOLIC PANEL
ALT: 11 U/L (ref 0–35)
AST: 11 U/L (ref 0–37)
Albumin: 2 g/dL — ABNORMAL LOW (ref 3.5–5.2)
Alkaline Phosphatase: 97 U/L (ref 39–117)
BUN: 5 mg/dL — ABNORMAL LOW (ref 6–23)
CALCIUM: 8.5 mg/dL (ref 8.4–10.5)
CO2: 24 mEq/L (ref 19–32)
Chloride: 102 mEq/L (ref 96–112)
Creatinine, Ser: 0.65 mg/dL (ref 0.50–1.10)
GFR calc Af Amer: >90 mL/min (ref >90)
Glucose, Bld: 117 mg/dL — ABNORMAL HIGH (ref 70–99)
Potassium: 4.1 mEq/L (ref 3.7–5.3)
Sodium: 135 mEq/L — ABNORMAL LOW (ref 137–147)
Total Bilirubin: 0.3 mg/dL (ref 0.3–1.2)
Total Protein: 6.9 g/dL (ref 6.0–8.3)

## 2013-11-28 LAB — CBC
HCT: 32.8 % — ABNORMAL LOW (ref 36.0–46.0)
Hemoglobin: 10.8 g/dL — ABNORMAL LOW (ref 12.0–15.0)
MCH: 26 pg (ref 26.0–34.0)
MCHC: 32.9 g/dL (ref 30.0–36.0)
MCV: 79 fL (ref 78.0–100.0)
Platelets: 244 10*3/uL (ref 150–400)
RBC: 4.15 MIL/uL (ref 3.87–5.11)
RDW: 14.2 % (ref 11.5–15.5)
WBC: 8.5 10*3/uL (ref 4.0–10.5)

## 2013-11-28 LAB — URINALYSIS, ROUTINE W REFLEX MICROSCOPIC
Bilirubin Urine: NEGATIVE
GLUCOSE, UA: NEGATIVE mg/dL
HGB URINE DIPSTICK: NEGATIVE
Ketones, ur: NEGATIVE mg/dL
Leukocytes, UA: NEGATIVE
Nitrite: NEGATIVE
PH: 7 (ref 5.0–8.0)
Protein, ur: NEGATIVE mg/dL
Specific Gravity, Urine: 1.015 (ref 1.005–1.030)
UROBILINOGEN UA: 0.2 mg/dL (ref 0.0–1.0)

## 2013-11-28 LAB — PROTEIN / CREATININE RATIO, URINE
CREATININE, URINE: 99.35 mg/dL
Protein Creatinine Ratio: 0.44 — ABNORMAL HIGH (ref 0.00–0.15)
Total Protein, Urine: 43.6 mg/dL

## 2013-11-28 LAB — TYPE AND SCREEN
ABO/RH(D): B POS
ANTIBODY SCREEN: NEGATIVE

## 2013-11-28 LAB — LACTATE DEHYDROGENASE: LDH: 199 U/L (ref 94–250)

## 2013-11-28 LAB — URIC ACID: Uric Acid, Serum: 2.8 mg/dL (ref 2.4–7.0)

## 2013-11-28 SURGERY — Surgical Case
Anesthesia: Spinal | Site: Abdomen

## 2013-11-28 MED ORDER — MAGNESIUM SULFATE BOLUS VIA INFUSION
4.0000 g | Freq: Once | INTRAVENOUS | Status: AC
  Administered 2013-11-28: 4 g via INTRAVENOUS
  Filled 2013-11-28: qty 500

## 2013-11-28 MED ORDER — LABETALOL HCL 100 MG PO TABS
200.0000 mg | ORAL_TABLET | Freq: Once | ORAL | Status: AC
  Administered 2013-11-28: 200 mg via ORAL
  Filled 2013-11-28: qty 2

## 2013-11-28 MED ORDER — SODIUM BICARBONATE 8.4 % IV SOLN
INTRAVENOUS | Status: DC | PRN
  Administered 2013-11-28: 3 mL via EPIDURAL
  Administered 2013-11-28: 5 mL via EPIDURAL
  Administered 2013-11-28: 2 mL via EPIDURAL

## 2013-11-28 MED ORDER — SCOPOLAMINE 1 MG/3DAYS TD PT72
1.0000 | MEDICATED_PATCH | Freq: Once | TRANSDERMAL | Status: DC
  Administered 2013-11-28: 1.5 mg via TRANSDERMAL

## 2013-11-28 MED ORDER — MEPERIDINE HCL 25 MG/ML IJ SOLN: 6.2500 mg | INTRAMUSCULAR | Status: DC | PRN

## 2013-11-28 MED ORDER — FAMOTIDINE IN NACL 20-0.9 MG/50ML-% IV SOLN
20.0000 mg | Freq: Once | INTRAVENOUS | Status: AC
  Administered 2013-11-28: 20 mg via INTRAVENOUS
  Filled 2013-11-28: qty 50

## 2013-11-28 MED ORDER — BUPIVACAINE IN DEXTROSE 0.75-8.25 % IT SOLN
INTRATHECAL | Status: AC
  Filled 2013-11-28: qty 2

## 2013-11-28 MED ORDER — FENTANYL CITRATE 0.05 MG/ML IJ SOLN
INTRAMUSCULAR | Status: AC
  Filled 2013-11-28: qty 2

## 2013-11-28 MED ORDER — PHENYLEPHRINE 8 MG IN D5W 100 ML (0.08MG/ML) PREMIX OPTIME
INJECTION | INTRAVENOUS | Status: AC
  Filled 2013-11-28: qty 100

## 2013-11-28 MED ORDER — MORPHINE SULFATE 0.5 MG/ML IJ SOLN
INTRAMUSCULAR | Status: AC
  Filled 2013-11-28: qty 10

## 2013-11-28 MED ORDER — FENTANYL CITRATE 0.05 MG/ML IJ SOLN
25.0000 ug | INTRAMUSCULAR | Status: DC | PRN
  Administered 2013-11-28 (×4): 50 ug via INTRAVENOUS

## 2013-11-28 MED ORDER — OXYTOCIN 10 UNIT/ML IJ SOLN
INTRAMUSCULAR | Status: AC
  Filled 2013-11-28: qty 4

## 2013-11-28 MED ORDER — FENTANYL CITRATE 0.05 MG/ML IJ SOLN
INTRAMUSCULAR | Status: DC | PRN
  Administered 2013-11-28: 25 ug via INTRATHECAL

## 2013-11-28 MED ORDER — ONDANSETRON HCL 4 MG/2ML IJ SOLN
INTRAMUSCULAR | Status: AC
  Filled 2013-11-28: qty 2

## 2013-11-28 MED ORDER — ONDANSETRON HCL 4 MG/2ML IJ SOLN
INTRAMUSCULAR | Status: DC | PRN
  Administered 2013-11-28: 4 mg via INTRAVENOUS

## 2013-11-28 MED ORDER — LACTATED RINGERS IV SOLN
INTRAVENOUS | Status: DC
  Administered 2013-11-28 (×2): via INTRAVENOUS

## 2013-11-28 MED ORDER — METOCLOPRAMIDE HCL 5 MG/ML IJ SOLN
10.0000 mg | Freq: Once | INTRAMUSCULAR | Status: AC
  Administered 2013-11-28: 10 mg via INTRAVENOUS
  Filled 2013-11-28: qty 2

## 2013-11-28 MED ORDER — OXYTOCIN 10 UNIT/ML IJ SOLN
40.0000 [IU] | INTRAMUSCULAR | Status: DC | PRN
  Administered 2013-11-28: 40 [IU] via INTRAVENOUS

## 2013-11-28 MED ORDER — LIDOCAINE-EPINEPHRINE (PF) 2 %-1:200000 IJ SOLN
INTRAMUSCULAR | Status: AC
  Filled 2013-11-28: qty 20

## 2013-11-28 MED ORDER — FENTANYL CITRATE 0.05 MG/ML IJ SOLN
INTRAMUSCULAR | Status: DC | PRN
  Administered 2013-11-28: 50 ug via INTRAVENOUS
  Administered 2013-11-28: 25 ug via INTRAVENOUS

## 2013-11-28 MED ORDER — METOCLOPRAMIDE HCL 5 MG/ML IJ SOLN: 10.0000 mg | Freq: Once | INTRAMUSCULAR | Status: AC | PRN

## 2013-11-28 MED ORDER — SODIUM BICARBONATE 8.4 % IV SOLN
INTRAVENOUS | Status: AC
  Filled 2013-11-28: qty 50

## 2013-11-28 MED ORDER — MAGNESIUM SULFATE 40 G IN LACTATED RINGERS - SIMPLE
2.5000 g/h | INTRAVENOUS | Status: AC
  Administered 2013-11-28 (×2): 2 g/h via INTRAVENOUS
  Administered 2013-11-29: 2.5 g/h via INTRAVENOUS
  Filled 2013-11-28 (×2): qty 500

## 2013-11-28 MED ORDER — MORPHINE SULFATE (PF) 0.5 MG/ML IJ SOLN
INTRAMUSCULAR | Status: DC | PRN
  Administered 2013-11-28: .15 mg via INTRATHECAL

## 2013-11-28 MED ORDER — KETOROLAC TROMETHAMINE 60 MG/2ML IM SOLN
60.0000 mg | Freq: Once | INTRAMUSCULAR | Status: AC | PRN
  Administered 2013-11-28: 60 mg via INTRAMUSCULAR

## 2013-11-28 MED ORDER — KETOROLAC TROMETHAMINE 60 MG/2ML IM SOLN
INTRAMUSCULAR | Status: AC
  Filled 2013-11-28: qty 2

## 2013-11-28 MED ORDER — CITRIC ACID-SODIUM CITRATE 334-500 MG/5ML PO SOLN
30.0000 mL | Freq: Once | ORAL | Status: DC
  Filled 2013-11-28: qty 15

## 2013-11-28 MED ORDER — DEXTROSE 5 % IV SOLN
3.0000 g | INTRAVENOUS | Status: AC
  Administered 2013-11-28: 3 g via INTRAVENOUS
  Filled 2013-11-28: qty 3000

## 2013-11-28 MED ORDER — BUPIVACAINE IN DEXTROSE 0.75-8.25 % IT SOLN
INTRATHECAL | Status: DC | PRN
  Administered 2013-11-28: 1.5 mL via INTRATHECAL

## 2013-11-28 MED ORDER — LABETALOL HCL 200 MG PO TABS: 200.0000 mg | ORAL_TABLET | ORAL | Status: DC

## 2013-11-28 MED ORDER — CHLOROPROCAINE HCL 3 % IJ SOLN
INTRAMUSCULAR | Status: AC
  Filled 2013-11-28: qty 20

## 2013-11-28 MED ORDER — SCOPOLAMINE 1 MG/3DAYS TD PT72
MEDICATED_PATCH | TRANSDERMAL | Status: AC
  Filled 2013-11-28: qty 1

## 2013-11-28 MED ORDER — PHENYLEPHRINE 8 MG IN D5W 100 ML (0.08MG/ML) PREMIX OPTIME
INJECTION | INTRAVENOUS | Status: DC | PRN
  Administered 2013-11-28: 20 ug/min via INTRAVENOUS

## 2013-11-28 SURGICAL SUPPLY — 37 items
BENZOIN TINCTURE PRP APPL 2/3 (GAUZE/BANDAGES/DRESSINGS) ×3 IMPLANT
CLAMP CORD UMBIL (MISCELLANEOUS) IMPLANT
CLOTH BEACON ORANGE TIMEOUT ST (SAFETY) ×3 IMPLANT
CONTAINER PREFILL 10% NBF 15ML (MISCELLANEOUS) IMPLANT
DRAPE LG THREE QUARTER DISP (DRAPES) IMPLANT
DRESSING DISP NPWT PICO 4X12 (MISCELLANEOUS) ×3 IMPLANT
DRSG OPSITE POSTOP 4X10 (GAUZE/BANDAGES/DRESSINGS) ×3 IMPLANT
DURAPREP 26ML APPLICATOR (WOUND CARE) ×3 IMPLANT
ELECT REM PT RETURN 9FT ADLT (ELECTROSURGICAL) ×3
ELECTRODE REM PT RTRN 9FT ADLT (ELECTROSURGICAL) ×2 IMPLANT
EXTRACTOR VACUUM M CUP 4 TUBE (SUCTIONS) IMPLANT
GLOVE BIO SURGEON STRL SZ7.5 (GLOVE) ×3 IMPLANT
GLOVE BIOGEL PI IND STRL 7.5 (GLOVE) ×2 IMPLANT
GLOVE BIOGEL PI INDICATOR 7.5 (GLOVE) ×1
GOWN STRL REUS W/ TWL XL LVL3 (GOWN DISPOSABLE) ×2 IMPLANT
GOWN STRL REUS W/TWL LRG LVL3 (GOWN DISPOSABLE) ×3 IMPLANT
GOWN STRL REUS W/TWL XL LVL3 (GOWN DISPOSABLE) ×1
KIT ABG SYR 3ML LUER SLIP (SYRINGE) IMPLANT
NEEDLE HYPO 25X5/8 SAFETYGLIDE (NEEDLE) IMPLANT
NS IRRIG 1000ML POUR BTL (IV SOLUTION) ×3 IMPLANT
PACK C SECTION WH (CUSTOM PROCEDURE TRAY) ×3 IMPLANT
PAD OB MATERNITY 4.3X12.25 (PERSONAL CARE ITEMS) ×3 IMPLANT
RETRACTOR WND ALEXIS 25 LRG (MISCELLANEOUS) ×2 IMPLANT
RTRCTR WOUND ALEXIS 25CM LRG (MISCELLANEOUS) ×3
STRIP CLOSURE SKIN 1/2X4 (GAUZE/BANDAGES/DRESSINGS) ×3 IMPLANT
SUT CHROMIC 2 0 CT 1 (SUTURE) ×3 IMPLANT
SUT MNCRL AB 3-0 PS2 27 (SUTURE) ×3 IMPLANT
SUT PLAIN 0 NONE (SUTURE) IMPLANT
SUT PLAIN 2 0 XLH (SUTURE) ×3 IMPLANT
SUT VIC AB 0 CT1 36 (SUTURE) ×9 IMPLANT
SUT VIC AB 0 CTX 36 (SUTURE) ×3
SUT VIC AB 0 CTX36XBRD ANBCTRL (SUTURE) ×6 IMPLANT
SUT VIC AB 3-0 SH 27 (SUTURE) ×2
SUT VIC AB 3-0 SH 27X BRD (SUTURE) ×4 IMPLANT
TOWEL OR 17X24 6PK STRL BLUE (TOWEL DISPOSABLE) ×3 IMPLANT
TRAY FOLEY CATH 14FR (SET/KITS/TRAYS/PACK) ×3 IMPLANT
WATER STERILE IRR 1000ML POUR (IV SOLUTION) ×3 IMPLANT

## 2013-11-28 NOTE — Anesthesia Procedure Notes (Signed)
Spinal  Patient location during procedure: OR Start time: 11/28/2013 8:23 PM Staffing Anesthesiologist: Yanette Tripoli A. Performed by: anesthesiologist  Preanesthetic Checklist Completed: patient identified, site marked, surgical consent, pre-op evaluation, timeout performed, IV checked, risks and benefits discussed and monitors and equipment checked Spinal Block Patient position: sitting Prep: site prepped and draped and DuraPrep Patient monitoring: cardiac monitor, continuous pulse ox, blood pressure and heart rate Approach: midline Location: L3-4 Injection technique: catheter Needle Needle type: Tuohy and Sprotte  Needle gauge: 24 G Needle length: 15 cm Needle insertion depth: 9 cm Catheter type: closed end flexible Catheter size: 19 g Catheter at skin depth: 14 cm Assessment Sensory level: T4 Additional Notes Epidural space ID'd first , then sprotte through epidural needle. CSF clear, free flow, no heme or paresthesias. Intrathecal medications injected. Spinal needle withdrawn and 19ga epidural catheter threaded 5 cm into epidural space. Epidural needle withdrawn and sterile dressing applied. Adequate sensory level obtained. Patient tolerated procedure well.

## 2013-11-28 NOTE — Op Note (Signed)
Cesarean Section Procedure Note   Autumn [name]  11/28/2013  Indications: Scheduled Proceedure/Maternal Request.  Superimposed preeclampsia  Pre-operative Diagnosis: Preeclampsia, Desires Sterilization. H/o prior cesarean requires repeat  Post-operative Diagnosis: Same   Surgeon Mazie Fencl  Assistants: Almond LintShelly Lillard CNM  Anesthesia: spinal   Procedure Details:  The patient was seen in the Holding Room. The risks, benefits, complications, treatment options, and expected outcomes were discussed with the patient. The patient concurred with the proposed plan, giving informed consent. identified as Autumn [name] and the procedure verified as C-Section Delivery. A Time Out was held and the above information confirmed.  After induction of anesthesia, the patient was draped and prepped in the usual sterile manner. A transverse incision was made and carried down through the subcutaneous tissue to the fascia. Fascial incision was made and extended transversely. The fascia was separated from the underlying rectus tissue superiorly and inferiorly. The peritoneum was identified and entered. Peritoneal incision was extended longitudinally. The utero-vesical peritoneal reflection was incised transversely and the bladder flap was bluntly freed from the lower uterine segment. A low transverse uterine incision was made. Delivered from cephalic presentation was a  pound Female with Apgar scores of 8 at one minute and 9 at five minutes. Cord ph was sent the umbilical cord was clamped and cut cord blood was obtained for evaluation. The placenta was removed Intact and appeared normal. The uterine outline, tubes and ovaries appeared normal}. The uterine incision was closed with running locked sutures of 0Vicryl. A second layer of 0 vicryl was used to imbricate the uterus.  The patients left fallopian tube was grasped at the mid ishtmic portion with babcock clamp, ligated with 2-0 plain and excised.  The  patients right fallopian tube was followed out to the fimbriated end.  The fallopian tube was clamped with a kelly  and excised. The left fallopian tube was ligated with 2-0 plain in the midisthmic portion of the tube. The entire tube was not removed, because of increase vasculartity of the mesosalpinx.   Both portions of tubes was sent to pathology.     Hemostasis was observed. Lavage was carried out until clear. The fascia was then reapproximated with running sutures of 0Vicryl. The subcuticular closure was performed using 2-0plain gut. The skin was closed with 3-850monocryl.   Instrument, sponge, and needle counts were correct prior the abdominal closure and were correct at the conclusion of the case.    Findings: female presentation in vtx presentation.  Normal appearing uterus, tubes and ovaries B   Estimated Blood Loss: 800 ml  Total IV Fluids: 1300ml   Urine Output: 200CC OF clear urine  Specimens: @ORSPECIMEN @   Complications: no complications  Disposition: PACU - hemodynamically stable.   Maternal Condition: stable   Baby condition / location:  Couplet care / Skin to Skin  Attending Attestation: I performed the procedure.   Signed: Surgeon(s): Michael LitterNaima A Elizah Mierzwa, MD

## 2013-11-28 NOTE — H&P (Signed)
Date of Initial H&P: 12/08/13  History reviewed, patient examined, no change in status, stable for surgery.  R& B reviewed with the pt

## 2013-11-28 NOTE — Anesthesia Postprocedure Evaluation (Signed)
  Anesthesia Post-op Note  Patient: Autumn [name]  Procedure(s) Performed: Procedure(s) with comments: CESAREAN SECTION (N/A) BILATERAL SALPINGECTOMY (Bilateral) - Partial on Left  Patient Location: PACU  Anesthesia Type:Spinal  Level of Consciousness: awake, alert  and oriented  Airway and Oxygen Therapy: Patient Spontanous Breathing  Post-op Pain: none  Post-op Assessment: Post-op Vital signs reviewed, Patient's Cardiovascular Status Stable, Respiratory Function Stable, Patent Airway, No signs of Nausea or vomiting, Pain level controlled, No headache and No backache  Post-op Vital Signs: Reviewed and stable  Complications: No apparent anesthesia complications

## 2013-11-28 NOTE — H&P (Signed)
Autumn [name] is a 28 y.o. female, G2P1001 at 95 3/7 weeks, presented to MAU for BP evaluation--hx chronic hypertension, now dx with superimposed pre-eclampsia.  Reports mild HA (but has hx of chronic HAs), dizziness yesterday.  No epigastric pain.  On Labetalol 400 QID, with doses today at 9am and in MAU at 1611.  Patient Active Problem List   Diagnosis Date Noted  . Chronic hypertension with superimposed preeclampsia 11/28/2013  . Positive GBS test 11/28/2013  . Chronic headaches 11/28/2013  . Leaky heart valve 05/12/2012  . Morbid obesity 01/22/2012    History of present pregnancy: Patient entered care at 11 weeks.   EDC of 12/16/13 was established by LMP and 12 week [place].   Anatomy scan:  21 1/7 weeks, with normal findings and an anterior placenta.   Additional [place] evaluations:  25 1/7 weeks--EFW 835 gm, 61.7, normal fluid. 31 weeks--EFW 2098 gm, 4+10, AFI 22.95, high normal.   33 weeks--BPP 8/8, AFI 21.2, 80%ile. 33 6/7 weeks--AFI 18.9, 70%ile, BPP 8/8 35 weeks--EFW 5+14, AFI 22   Significant prenatal events:  Saw Dr. Mayford Knife for cardiology eval in 07/2013--normal sinus rhythm, occasional PAC.  Had Holter monitor 10/14 WNL.  Negative dopplers for leg pain.  Treated   Last evaluation:  Today at [facility], with elevated BP, BPP 8/8, sent to MAU  OB History   Grav Para Term Preterm Abortions TAB SAB Ect Mult Living   2 1 1  0 0 0 0 0 0 1    2013--Primary LTCS due to FTP, induced due to chronic hypertension, female, 8+7, Dr. Pennie Rushing  Past Medical History  Diagnosis Date  . Leaky heart valve     mild MR by echo  . Heart murmur   . H/O varicella   . Obese   . Hypertension     stopped meds since pregnant   Past Surgical History  Procedure Laterality Date  . Cesarean section  05/14/2012    Procedure: CESAREAN SECTION;  Surgeon: Hal Morales, MD;  Location: [facility];  Service: Gynecology;  Laterality: N/A;  Primary cesarean section of baby girl    at 80 APGAR 8/9   Family  History: family history includes Anemia in her sister; Cancer (age of onset: 51) in her mother; Diabetes in her sister; Heart disease in her father; Hypertension in her mother and sister; Thyroid disease in her sister. There is no history of Anesthesia problems.  Social History:  reports that she has never smoked. She has never used smokeless tobacco. She reports that she does not drink alcohol or use illicit drugs.  Husband is involved and supportive.  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   Prenatal Transfer Tool  Maternal Diabetes: No Genetic Screening: Normal Maternal Ultrasounds/Referrals: Normal Fetal Ultrasounds or other Referrals:  None Maternal Substance Abuse:  No Significant Maternal Medications:  Meds include: Other: Labetalol 400 mg po BID Significant Maternal Lab Results: Lab values include: Group B Strep positive    ROS:  Mild HA, +FM  No Known Allergies     Blood pressure 176/90, pulse 85, temperature 98.7 F (37.1 C), temperature source Oral, resp. rate 18, height 5\' 3"  (1.6 m), weight 336 lb 12.8 oz (152.771 kg), last menstrual period 10/07/2012, unknown if currently breastfeeding.  Filed Vitals:   11/28/13 1611 11/28/13 1615 11/28/13 1629 11/28/13 1644  BP: 190/87 175/99 175/88 176/90  Pulse:  90 76 85  Temp:      TempSrc:      Resp:  Height:      Weight:        Chest clear Heart RRR without murmur Abd gravid, NT, FH 38 cm Pelvic: Deferred Ext: DTR 1+, no clonus, 1-2+ edema  FHR: Category 1 UCs:  None  Prenatal labs: ABO, Rh:  B+ Antibody:  Neg Rubella:   Immune RPR:   NR HBsAg:   Neg HIV:   NR GBS: Positive (02/25 0000)Pos   GC:  Not done Chlamydia:  Not done Genetic screenings:  1st trimester screen and AFP WNL  Glucola:  Elevated early 1 hour GTT, normal f/u 3 hr GTT, normal f/u 3 hr GTT Other:  Bile acids WNL, 24 hour urine 240 in early pregnancy.   Assessment/Plan: IUP at 37 3/7 weeks Chronic hypertension with superimposed  pre-eclampsia GBS positive Morbid obesity Hx "leaky heart valve"--mild mitral regurg by echo, cardiac eval by Dr. Mayford Knifeurner 08/20/13.  Plan: [facility] consult with Dr. Aaron Moseoberts--for repeat C/S, due to elevated BP. Routine [facility] pre-op orders Magnesium sulfate 4 gm bolus, then 2 gm/hr--will start in MAU. Will proceed to OR when ready.  Nyra CapesLATHAM, [place], [place] 11/28/2013, 6:21 PM

## 2013-11-28 NOTE — MAU Note (Signed)
Pt sent from office for elevated b/p. C/O headache today. Having occasional braxton hicks contreactions.

## 2013-11-28 NOTE — Consult Note (Addendum)
Neonatology Note:   Attendance at C-section:    I was asked by Dr. Normand Sloopillard to attend this repeat C/S at 37 3/[redacted] weeks GA [facility]. The mother is a G2P1 B pos, GBS pos with morbid obesity. ROM at delivery, fluid clear. Vacuum-assisted delivery. Infant vigorous with good spontaneous cry and tone. Needed only minimal bulb suctioning. Ap 8/9. Lungs clear to ausc in DR. To CN to care of Pediatrician.   Doretha Souhristie C. Luv Mish, MD

## 2013-11-28 NOTE — Anesthesia Preprocedure Evaluation (Addendum)
Anesthesia Evaluation  Patient identified by MRN, date of birth, ID band Patient awake    Reviewed: Allergy & Precautions, H&P , NPO status , Patient's Chart, lab work & pertinent test results, reviewed documented beta blocker date and time   History of Anesthesia Complications Negative for: history of anesthetic complications  Airway Mallampati: II TM Distance: >3 FB Neck ROM: full    Dental  (+) Partial Upper   Pulmonary neg pulmonary ROS,  breath sounds clear to auscultation        Cardiovascular hypertension (chronic HTN with superimposed preeclampsia), On Home Beta Blockers + Valvular Problems/Murmurs (mild MR) Rhythm:regular Rate:Normal     Neuro/Psych negative neurological ROS  negative psych ROS   GI/Hepatic negative GI ROS, Neg liver ROS,   Endo/Other  Morbid obesity  Renal/GU negative Renal ROS  negative genitourinary   Musculoskeletal   Abdominal   Peds  Hematology negative hematology ROS (+) T&S pending   Anesthesia Other Findings Last ate (full meal) at 1:30 pm Per Dr Su Hiltoberts, case is urgent and cannot wait for NPO  Reproductive/Obstetrics (+) Pregnancy (h/o c/s x1, for repeat)                          Anesthesia Physical Anesthesia Plan  ASA: III  Anesthesia Plan: Combined Spinal and Epidural   Post-op Pain Management:    Induction:   Airway Management Planned:   Additional Equipment:   Intra-op Plan:   Post-operative Plan:   Informed Consent: I have reviewed the patients History and Physical, chart, labs and discussed the procedure including the risks, benefits and alternatives for the proposed anesthesia with the patient or authorized representative who has indicated his/her understanding and acceptance.     Plan Discussed with: Surgeon and CRNA  Anesthesia Plan Comments:         Anesthesia Quick Evaluation

## 2013-11-28 NOTE — Transfer of Care (Signed)
Immediate Anesthesia Transfer of Care Note  Patient: Autumn [name]  Procedure(s) Performed: Procedure(s) with comments: CESAREAN SECTION (N/A) BILATERAL SALPINGECTOMY (Bilateral) - Partial on Left  Patient Location: PACU  Anesthesia Type:Regional  Level of Consciousness: awake, alert , oriented and patient cooperative  Airway & Oxygen Therapy: Patient Spontanous Breathing  Post-op Assessment: Report given to PACU RN  Post vital signs: Reviewed  Complications: No apparent anesthesia complications

## 2013-11-29 ENCOUNTER — Encounter (HOSPITAL_COMMUNITY): Payer: Self-pay | Admitting: *Deleted

## 2013-11-29 DIAGNOSIS — O119 Pre-existing hypertension with pre-eclampsia, unspecified trimester: Secondary | ICD-10-CM | POA: Diagnosis present

## 2013-11-29 LAB — CBC
HCT: 28.5 % — ABNORMAL LOW (ref 36.0–46.0)
Hemoglobin: 9.4 g/dL — ABNORMAL LOW (ref 12.0–15.0)
MCH: 26.2 pg (ref 26.0–34.0)
MCHC: 33 g/dL (ref 30.0–36.0)
MCV: 79.4 fL (ref 78.0–100.0)
Platelets: 206 10*3/uL (ref 150–400)
RBC: 3.59 MIL/uL — ABNORMAL LOW (ref 3.87–5.11)
RDW: 14.3 % (ref 11.5–15.5)
WBC: 10.7 10*3/uL — ABNORMAL HIGH (ref 4.0–10.5)

## 2013-11-29 LAB — COMPREHENSIVE METABOLIC PANEL
ALT: 7 U/L (ref 0–35)
AST: 16 U/L (ref 0–37)
Albumin: 1.7 g/dL — ABNORMAL LOW (ref 3.5–5.2)
Alkaline Phosphatase: 83 U/L (ref 39–117)
BUN: 4 mg/dL — ABNORMAL LOW (ref 6–23)
CALCIUM: 7.7 mg/dL — AB (ref 8.4–10.5)
CO2: 25 meq/L (ref 19–32)
Chloride: 104 mEq/L (ref 96–112)
Creatinine, Ser: 0.55 mg/dL (ref 0.50–1.10)
GFR calc Af Amer: >90 mL/min (ref >90)
GFR calc non Af Amer: >90 mL/min (ref >90)
Glucose, Bld: 109 mg/dL — ABNORMAL HIGH (ref 70–99)
Potassium: 4 mEq/L (ref 3.7–5.3)
Sodium: 137 mEq/L (ref 137–147)
Total Bilirubin: 0.4 mg/dL (ref 0.3–1.2)
Total Protein: 5.9 g/dL — ABNORMAL LOW (ref 6.0–8.3)

## 2013-11-29 LAB — URIC ACID: URIC ACID, SERUM: 3 mg/dL (ref 2.4–7.0)

## 2013-11-29 LAB — LACTATE DEHYDROGENASE: LDH: 279 U/L — ABNORMAL HIGH (ref 94–250)

## 2013-11-29 LAB — MAGNESIUM
MAGNESIUM: 3.1 mg/dL — AB (ref 1.5–2.5)
Magnesium: 3.7 mg/dL — ABNORMAL HIGH (ref 1.5–2.5)
Magnesium: 4.1 mg/dL — ABNORMAL HIGH (ref 1.5–2.5)

## 2013-11-29 LAB — RPR: RPR Ser Ql: NONREACTIVE

## 2013-11-29 MED ORDER — LABETALOL HCL 200 MG PO TABS
200.0000 mg | ORAL_TABLET | Freq: Two times a day (BID) | ORAL | Status: DC
  Administered 2013-11-29 – 2013-12-01 (×5): 200 mg via ORAL
  Filled 2013-11-29 (×8): qty 1

## 2013-11-29 MED ORDER — MEASLES, MUMPS & RUBELLA VAC ~~LOC~~ INJ
0.5000 mL | INJECTION | Freq: Once | SUBCUTANEOUS | Status: DC
  Filled 2013-11-29: qty 0.5

## 2013-11-29 MED ORDER — DIPHENHYDRAMINE HCL 50 MG/ML IJ SOLN: 12.5000 mg | INTRAMUSCULAR | Status: DC | PRN

## 2013-11-29 MED ORDER — DIPHENHYDRAMINE HCL 25 MG PO CAPS: 25.0000 mg | ORAL_CAPSULE | Freq: Four times a day (QID) | ORAL | Status: DC | PRN

## 2013-11-29 MED ORDER — ONDANSETRON HCL 4 MG/2ML IJ SOLN: 4.0000 mg | INTRAMUSCULAR | Status: DC | PRN

## 2013-11-29 MED ORDER — TETANUS-DIPHTH-ACELL PERTUSSIS 5-2.5-18.5 LF-MCG/0.5 IM SUSP
0.5000 mL | Freq: Once | INTRAMUSCULAR | Status: DC
  Filled 2013-11-29: qty 0.5

## 2013-11-29 MED ORDER — NALBUPHINE HCL 10 MG/ML IJ SOLN: 5.0000 mg | INTRAMUSCULAR | Status: DC | PRN

## 2013-11-29 MED ORDER — MENTHOL 3 MG MT LOZG: 1.0000 | LOZENGE | OROMUCOSAL | Status: DC | PRN

## 2013-11-29 MED ORDER — PRENATAL MULTIVITAMIN CH
1.0000 | ORAL_TABLET | Freq: Every day | ORAL | Status: DC
  Administered 2013-11-29 – 2013-12-01 (×3): 1 via ORAL
  Filled 2013-11-29 (×3): qty 1

## 2013-11-29 MED ORDER — SIMETHICONE 80 MG PO CHEW
80.0000 mg | CHEWABLE_TABLET | ORAL | Status: DC
  Administered 2013-11-29 – 2013-12-01 (×3): 80 mg via ORAL
  Filled 2013-11-29 (×3): qty 1

## 2013-11-29 MED ORDER — LACTATED RINGERS IV SOLN
INTRAVENOUS | Status: DC
  Administered 2013-11-29 (×3): via INTRAVENOUS

## 2013-11-29 MED ORDER — LANOLIN HYDROUS EX OINT: 1.0000 "application " | TOPICAL_OINTMENT | CUTANEOUS | Status: DC | PRN

## 2013-11-29 MED ORDER — METOCLOPRAMIDE HCL 5 MG/ML IJ SOLN: 10.0000 mg | Freq: Three times a day (TID) | INTRAMUSCULAR | Status: DC | PRN

## 2013-11-29 MED ORDER — ZOLPIDEM TARTRATE 5 MG PO TABS: 5.0000 mg | ORAL_TABLET | Freq: Every evening | ORAL | Status: DC | PRN

## 2013-11-29 MED ORDER — DIPHENHYDRAMINE HCL 25 MG PO CAPS: 25.0000 mg | ORAL_CAPSULE | ORAL | Status: DC | PRN

## 2013-11-29 MED ORDER — BISACODYL 10 MG RE SUPP: 10.0000 mg | Freq: Every day | RECTAL | Status: DC | PRN

## 2013-11-29 MED ORDER — WITCH HAZEL-GLYCERIN EX PADS: 1.0000 "application " | MEDICATED_PAD | CUTANEOUS | Status: DC | PRN

## 2013-11-29 MED ORDER — DIPHENHYDRAMINE HCL 50 MG/ML IJ SOLN: 25.0000 mg | INTRAMUSCULAR | Status: DC | PRN

## 2013-11-29 MED ORDER — ONDANSETRON HCL 4 MG PO TABS: 4.0000 mg | ORAL_TABLET | ORAL | Status: DC | PRN

## 2013-11-29 MED ORDER — FERROUS SULFATE 325 (65 FE) MG PO TABS
325.0000 mg | ORAL_TABLET | Freq: Two times a day (BID) | ORAL | Status: DC
  Administered 2013-11-29 – 2013-12-01 (×5): 325 mg via ORAL
  Filled 2013-11-29 (×5): qty 1

## 2013-11-29 MED ORDER — OXYCODONE-ACETAMINOPHEN 5-325 MG PO TABS
1.0000 | ORAL_TABLET | ORAL | Status: DC | PRN
  Administered 2013-11-29 (×2): 1 via ORAL
  Administered 2013-11-29 – 2013-12-01 (×8): 2 via ORAL
  Filled 2013-11-29 (×4): qty 2
  Filled 2013-11-29 (×2): qty 1
  Filled 2013-11-29 (×4): qty 2

## 2013-11-29 MED ORDER — NALOXONE HCL 1 MG/ML IJ SOLN
1.0000 ug/kg/h | INTRAVENOUS | Status: DC | PRN
  Filled 2013-11-29: qty 2

## 2013-11-29 MED ORDER — IBUPROFEN 600 MG PO TABS
600.0000 mg | ORAL_TABLET | Freq: Four times a day (QID) | ORAL | Status: DC
  Administered 2013-11-29 – 2013-12-01 (×10): 600 mg via ORAL
  Filled 2013-11-29 (×10): qty 1

## 2013-11-29 MED ORDER — SENNOSIDES-DOCUSATE SODIUM 8.6-50 MG PO TABS
2.0000 | ORAL_TABLET | ORAL | Status: DC
  Administered 2013-11-29 – 2013-12-01 (×3): 2 via ORAL
  Filled 2013-11-29 (×3): qty 2

## 2013-11-29 MED ORDER — ONDANSETRON HCL 4 MG/2ML IJ SOLN: 4.0000 mg | Freq: Three times a day (TID) | INTRAMUSCULAR | Status: DC | PRN

## 2013-11-29 MED ORDER — SIMETHICONE 80 MG PO CHEW
80.0000 mg | CHEWABLE_TABLET | ORAL | Status: DC | PRN
  Administered 2013-11-30: 80 mg via ORAL
  Filled 2013-11-29: qty 1

## 2013-11-29 MED ORDER — KETOROLAC TROMETHAMINE 30 MG/ML IJ SOLN: 30.0000 mg | Freq: Four times a day (QID) | INTRAMUSCULAR | Status: AC | PRN

## 2013-11-29 MED ORDER — DIBUCAINE 1 % RE OINT: 1.0000 "application " | TOPICAL_OINTMENT | RECTAL | Status: DC | PRN

## 2013-11-29 MED ORDER — SIMETHICONE 80 MG PO CHEW
80.0000 mg | CHEWABLE_TABLET | Freq: Three times a day (TID) | ORAL | Status: DC
  Administered 2013-11-29 – 2013-12-01 (×6): 80 mg via ORAL
  Filled 2013-11-29 (×6): qty 1

## 2013-11-29 MED ORDER — SODIUM CHLORIDE 0.9 % IJ SOLN: 3.0000 mL | INTRAMUSCULAR | Status: DC | PRN

## 2013-11-29 MED ORDER — FLEET ENEMA 7-19 GM/118ML RE ENEM: 1.0000 | ENEMA | Freq: Every day | RECTAL | Status: DC | PRN

## 2013-11-29 MED ORDER — NALOXONE HCL 0.4 MG/ML IJ SOLN
0.4000 mg | INTRAMUSCULAR | Status: DC | PRN
Start: 2013-11-29 — End: 2013-12-01

## 2013-11-29 MED ORDER — OXYTOCIN 40 UNITS IN LACTATED RINGERS INFUSION - SIMPLE MED: 62.5000 mL/h | INTRAVENOUS | Status: AC

## 2013-11-29 NOTE — Anesthesia Postprocedure Evaluation (Signed)
Anesthesia Post Note  Patient: Autumn [name]  Procedure(s) Performed: Procedure(s) (LRB): CESAREAN SECTION (N/A) BILATERAL SALPINGECTOMY (Bilateral)  Anesthesia type: CSE Patient location: Mother/Baby  Post pain: Pain level controlled  Post assessment: Post-op Vital signs reviewed  Last Vitals:  Filed Vitals:   11/29/13 0800  BP:   Pulse:   Temp: 37.1 C  Resp:     Post vital signs: Reviewed  Level of consciousness: awake  Complications: No apparent anesthesia complications

## 2013-11-29 NOTE — Progress Notes (Signed)
Results for Rexanne ManoWATSON, Ikeisha L (MRN [id number]) as of 11/29/2013 16:49  Ref. Range 11/29/2013 14:54  Magnesium Latest Range: 1.5-2.5 mg/dL 3.7 (H)  Dr. Su Hiltoberts made aware. New orders received.

## 2013-11-29 NOTE — Progress Notes (Addendum)
Subjective: Postpartum Day 1: Cesarean Delivery Patient reports tolerating PO.  No N/V.  Denies HA,visual changes or RUQ pain.  No flatus or BM yet.  Objective: Vital signs in last 24 hours: Temp:  [97.6 F (36.4 C)-98.8 F (37.1 C)] 97.9 F (36.6 C) (03/07 1144) Pulse Rate:  [60-93] 82 (03/07 1327) Resp:  [16-30] 20 (03/07 1327) BP: (106-190)/(48-112) 149/91 mmHg (03/07 1327) SpO2:  [96 %-100 %] 100 % (03/07 1327) Weight:  [144.788 kg (319 lb 3.2 oz)-152.771 kg (336 lb 12.8 oz)] 144.788 kg (319 lb 3.2 oz) (03/07 0530)  Physical Exam:  General: alert and no distress Lochia: appropriate Uterine Fundus: firm Incision: healing well and dressing intact  DVT Evaluation: No evidence of DVT seen on physical exam.   Recent Labs  11/28/13 1529 11/29/13 0525  HGB 10.8* 9.4*  HCT 32.8* 28.5*    Assessment/Plan: Status post Cesarean section. Postoperative course complicated by SI preeclampsia now on magnesium  Continue current care. BPs stable and mildly increased, will restart labetalol Adequate UOP Mg level low this am but mg only on for a few hrs since surgery.  Will recheck.   Plan to d/c mg in am  Elisabel Hanover Y 11/29/2013, 2:15 PM

## 2013-11-29 NOTE — Addendum Note (Signed)
Addendum created 11/29/13 0908 by Jhonnie GarnerBeth M Kushal Saunders, CRNA   Modules edited: Notes Section   Notes Section:  File: [id number]

## 2013-11-30 LAB — COMPREHENSIVE METABOLIC PANEL
ALT: 8 U/L (ref 0–35)
AST: 11 U/L (ref 0–37)
Albumin: 1.7 g/dL — ABNORMAL LOW (ref 3.5–5.2)
Alkaline Phosphatase: 86 U/L (ref 39–117)
BUN: 4 mg/dL — ABNORMAL LOW (ref 6–23)
CALCIUM: 7.7 mg/dL — AB (ref 8.4–10.5)
CO2: 26 meq/L (ref 19–32)
Chloride: 102 mEq/L (ref 96–112)
Creatinine, Ser: 0.64 mg/dL (ref 0.50–1.10)
GFR calc Af Amer: >90 mL/min (ref >90)
Glucose, Bld: 124 mg/dL — ABNORMAL HIGH (ref 70–99)
POTASSIUM: 4 meq/L (ref 3.7–5.3)
SODIUM: 137 meq/L (ref 137–147)
TOTAL PROTEIN: 5.9 g/dL — AB (ref 6.0–8.3)
Total Bilirubin: <0.2 mg/dL — ABNORMAL LOW (ref 0.3–1.2)

## 2013-11-30 LAB — URIC ACID: Uric Acid, Serum: 2.7 mg/dL (ref 2.4–7.0)

## 2013-11-30 LAB — LACTATE DEHYDROGENASE: LDH: 209 U/L (ref 94–250)

## 2013-11-30 NOTE — Progress Notes (Signed)
Subjective: Postpartum Day 2: Cesarean Delivery due to repeat, with chronic HTN with superimposed pre-eclampsia Patient up ad lib, reports no syncope or dizziness.  Pain well-controlled with po meds.  Denies HA, visual sx, or epigastric pain.  No flatus yet. Feeding:  Breast and bottle Contraceptive plan:  BTL  Objective: Vital signs in last 24 hours: Temp:  [97.9 F (36.6 C)-99.6 F (37.6 C)] 99.6 F (37.6 C) (03/08 0900) Pulse Rate:  [76-95] 82 (03/08 0900) Resp:  [16-20] 18 (03/08 0900) BP: (121-152)/(58-91) 145/76 mmHg (03/08 0900) SpO2:  [96 %-100 %] 100 % (03/08 0900) Weight:  [328 lb 1.6 oz (148.825 kg)] 328 lb 1.6 oz (148.825 kg) (03/08 0500) Weight 319.3 on 11/29/13  Intake/Output: +1,810 last 24 hours Output last shift 2950 Output this shift so far 350  Filed Vitals:   11/30/13 0600 11/30/13 0800 11/30/13 0900 11/30/13 1046  BP: 142/75 141/68 145/76 149/72  Pulse: 77  82 82  Temp:   99.6 F (37.6 C)   TempSrc:   Oral   Resp: 18  18 20   Height:      Weight:      SpO2: 100%  100% 100%   On Labetalol 200 mg po BID  Physical Exam:  General: alert Chest clear Heart RRR, unable to appreciate murmur (hx of) Abd soft, NT, + bowel sounds. Lochia: appropriate Uterine Fundus: firm Incision: Honeycomb dressing CDI with small amount old drainage noted on Pico dressing. DVT Evaluation: No evidence of DVT seen on physical exam. Negative Homan's sign. Calf/Ankle edema is present, 1+, but less than on admission.   Recent Labs  11/28/13 1529 11/29/13 0525  HGB 10.8* 9.4*  HCT 32.8* 28.5*   Results for orders placed during the hospital encounter of 11/28/13 (from the past 24 hour(s))  MAGNESIUM     Status: Abnormal   Collection Time    11/29/13  2:54 PM      Result Value Ref Range   Magnesium 3.7 (*) 1.5 - 2.5 mg/dL  MAGNESIUM     Status: Abnormal   Collection Time    11/29/13 10:50 PM      Result Value Ref Range   Magnesium 4.1 (*) 1.5 - 2.5 mg/dL  LACTATE  DEHYDROGENASE     Status: None   Collection Time    11/30/13  5:05 AM      Result Value Ref Range   LDH 209  94 - 250 U/L  COMPREHENSIVE METABOLIC PANEL     Status: Abnormal   Collection Time    11/30/13  5:05 AM      Result Value Ref Range   Sodium 137  137 - 147 mEq/L   Potassium 4.0  3.7 - 5.3 mEq/L   Chloride 102  96 - 112 mEq/L   CO2 26  19 - 32 mEq/L   Glucose, Bld 124 (*) 70 - 99 mg/dL   BUN 4 (*) 6 - 23 mg/dL   Creatinine, Ser 1.61  0.50 - 1.10 mg/dL   Calcium 7.7 (*) 8.4 - 10.5 mg/dL   Total Protein 5.9 (*) 6.0 - 8.3 g/dL   Albumin 1.7 (*) 3.5 - 5.2 g/dL   AST 11  0 - 37 U/L   ALT 8  0 - 35 U/L   Alkaline Phosphatase 86  39 - 117 U/L   Total Bilirubin <0.2 (*) 0.3 - 1.2 mg/dL   GFR calc non Af Amer >90  >90 mL/min   GFR calc Af Amer >90  >90  mL/min  URIC ACID     Status: None   Collection Time    11/30/13  5:05 AM      Result Value Ref Range   Uric Acid, Serum 2.7  2.4 - 7.0 mg/dL    Assessment/Plan: Status post Cesarean section day 2 Chronic hypertension with superimposed pre-eclampsia--s/p 24 hours Magnesium sulfate Doing well postoperatively.    Continue current care. Watch weight Transfer to routine pp care Anticipate d/c tomorrow.  Nigel BridgemanLATHAM, Nazaiah Navarrete 11/30/2013, 10:47 AM

## 2013-12-01 ENCOUNTER — Encounter (HOSPITAL_COMMUNITY): Payer: Self-pay | Admitting: Obstetrics and Gynecology

## 2013-12-01 LAB — COMPREHENSIVE METABOLIC PANEL
ALK PHOS: 79 U/L (ref 39–117)
ALT: 8 U/L (ref 0–35)
AST: 13 U/L (ref 0–37)
Albumin: 1.7 g/dL — ABNORMAL LOW (ref 3.5–5.2)
BUN: 5 mg/dL — ABNORMAL LOW (ref 6–23)
CO2: 24 meq/L (ref 19–32)
Calcium: 8.5 mg/dL (ref 8.4–10.5)
Chloride: 104 mEq/L (ref 96–112)
Creatinine, Ser: 0.62 mg/dL (ref 0.50–1.10)
GLUCOSE: 100 mg/dL — AB (ref 70–99)
POTASSIUM: 4.1 meq/L (ref 3.7–5.3)
Sodium: 137 mEq/L (ref 137–147)
Total Protein: 6.3 g/dL (ref 6.0–8.3)

## 2013-12-01 LAB — LACTATE DEHYDROGENASE: LDH: 226 U/L (ref 94–250)

## 2013-12-01 LAB — URIC ACID: Uric Acid, Serum: 2.6 mg/dL (ref 2.4–7.0)

## 2013-12-01 MED ORDER — IBUPROFEN 600 MG PO TABS: 600.0000 mg | ORAL_TABLET | Freq: Four times a day (QID) | ORAL | Status: DC

## 2013-12-01 MED ORDER — HYDROCHLOROTHIAZIDE 12.5 MG PO TABS: 25.0000 mg | ORAL_TABLET | Freq: Every day | ORAL | Status: DC

## 2013-12-01 MED ORDER — OXYCODONE-ACETAMINOPHEN 5-325 MG PO TABS: 1.0000 | ORAL_TABLET | ORAL | Status: DC | PRN

## 2013-12-01 MED ORDER — POTASSIUM CHLORIDE ER 10 MEQ PO TBCR: 10.0000 meq | EXTENDED_RELEASE_TABLET | Freq: Two times a day (BID) | ORAL | Status: AC

## 2013-12-01 MED ORDER — LABETALOL HCL 200 MG PO TABS
200.0000 mg | ORAL_TABLET | Freq: Two times a day (BID) | ORAL | Status: DC
Start: 2013-12-01 — End: 2015-12-14

## 2013-12-01 NOTE — Discharge Summary (Signed)
Obstetric Discharge Summary Reason for Admission: cesarean section and Preeclampsia Prenatal Procedures: NST, Preeclampsia and ultrasound Intrapartum Procedures: cesarean: low cervical, transverse Postpartum Procedures: none Complications-Operative and Postpartum: none Hemoglobin  Date Value Ref Range Status  11/29/2013 9.4* 12.0 - 15.0 g/dL Final     HCT  Date Value Ref Range Status  11/29/2013 28.5* 36.0 - 46.0 % Final    Physical Exam:  General: alert and cooperative Lochia: appropriate Uterine Fundus: firm Incision: healing well, no significant drainage DVT Evaluation: No evidence of DVT seen on physical exam. Physical Examination: Chest - clear to auscultation, no wheezes, rales or rhonchi, symmetric air entry Heart - normal rate and regular rhythm Abdomen - soft, nontender, nondistended, no masses or organomegaly Extremities - pedal edema 3 + no sign of DVT B   Discharge Diagnoses: Term Pregnancy-delivered.  Pt was sent from the office with elevated blood pressures. [facility] the hospital [facility]  She had a repeat CS and received 24 hours of Magnesium sulfate. She has a PICO dressing and will return to the office in 7 days for BP check and dressing removal  Discharge Information: Date: 12/01/2013 Activity: pelvic rest Diet: routine Medications: PNV, Colace, Iron and Percocet, Labetalol Condition: stable Instructions: refer to practice specific booklet and Return in the office for dressing removal.   Discharge to: home Follow-up Information   [facility] On 12/05/2013. (For wound re-check 10:00 am with Kerry FortElmira)    Specialty:  Obstetrics and Gynecology   Contact information:   [address] [place] [id number] [phone]      Newborn Data: Live born female  Birth Weight: 7 lb 1.2 oz (3210 g) APGAR: 8, 9  Home with mother.  Desmund Elman A 12/01/2013, 11:15 AM

## 2013-12-01 NOTE — Discharge Instructions (Signed)
Cesarean Delivery °Care After °Refer to this sheet in the next few weeks. These instructions provide you with information on caring for yourself after your procedure. Your health care provider may also give you specific instructions. Your treatment has been planned according to current medical practices, but problems sometimes occur. Call your health care provider if you have any problems or questions after you go home. °HOME CARE INSTRUCTIONS  °· Only take over-the-counter or prescription medications as directed by your health care provider. °· Do not drink alcohol, especially if you are breastfeeding or taking medication to relieve pain. °· Do not chew or smoke tobacco. °· Continue to use good perineal care. Good perineal care includes: °· Wiping your perineum from front to back. °· Keeping your perineum clean. °· Check your surgical cut (incision) daily for increased redness, drainage, swelling, or separation of skin. °· Clean your incision gently with soap and water every day, and then pat it dry. If your health care provider says it is OK, leave the incision uncovered. Use a bandage (dressing) if the incision is draining fluid or appears irritated. If the adhesive strips across the incision do not fall off within 7 days, carefully peel them off. °· Hug a pillow when coughing or sneezing until your incision is healed. This helps to relieve pain. °· Do not use tampons or douche until your health care provider says it is okay. °· Shower, wash your hair, and take tub baths as directed by your health care provider. °· Wear a well-fitting bra that provides breast support. °· Limit wearing support panties or control-top hose. °· Drink enough fluids to keep your urine clear or pale yellow. °· Eat high-fiber foods such as whole grain cereals and breads, brown rice, beans, and fresh fruits and vegetables every day. These foods may help prevent or relieve constipation. °· Resume activities such as climbing stairs,  driving, lifting, exercising, or traveling as directed by your health care provider. °· Talk to your health care provider about resuming sexual activities. This is dependent upon your risk of infection, your rate of healing, and your comfort and desire to resume sexual activity. °· Try to have someone help you with your household activities and your newborn for at least a few days after you leave the hospital. °· Rest as much as possible. Try to rest or take a nap when your newborn is sleeping. °· Increase your activities gradually. °· Keep all of your scheduled postpartum appointments. It is very important to keep your scheduled follow-up appointments. At these appointments, your health care provider will be checking to make sure that you are healing physically and emotionally. °SEEK MEDICAL CARE IF:  °· You are passing large clots from your vagina. Save any clots to show your health care provider. °· You have a foul smelling discharge from your vagina. °· You have trouble urinating. °· You are urinating frequently. °· You have pain when you urinate. °· You have a change in your bowel movements. °· You have increasing redness, pain, or swelling near your incision. °· You have pus draining from your incision. °· Your incision is separating. °· You have painful, hard, or reddened breasts. °· You have a severe headache. °· You have blurred vision or see spots. °· You feel sad or depressed. °· You have thoughts of hurting yourself or your newborn. °· You have questions about your care, the care of your newborn, or medications. °· You are dizzy or lightheaded. °· You have a rash. °· You   have pain, redness, or swelling at the site of the removed intravenous access (IV) tube. °· You have nausea or vomiting. °· You stopped breastfeeding and have not had a menstrual period within 12 weeks of stopping. °· You are not breastfeeding and have not had a menstrual period within 12 weeks of delivery. °· You have a fever. °SEEK  IMMEDIATE MEDICAL CARE IF: °· You have persistent pain. °· You have chest pain. °· You have shortness of breath. °· You faint. °· You have leg pain. °· You have stomach pain. °· Your vaginal bleeding saturates 2 or more sanitary pads in 1 hour. °MAKE SURE YOU:  °· Understand these instructions. °· Will watch your condition. °· Will get help right away if you are not doing well or get worse. °Document Released: 06/03/2002 Document Revised: 05/14/2013 Document Reviewed: 05/08/2012 °[employer]® Patient Information ©2014 [employer]. ° ° ° °

## 2013-12-01 NOTE — Lactation Note (Signed)
This note was copied from the chart of Autumn Ranee GosselinJacinta Torry. Lactation Consultation Note: mom has been mostly bottle feeding formula. Reports that she pumped twice yesterday and has not pumped yet today. Reports that she has put the baby to the breast a few times but she just goes to sleep. Reports that breasts are feeling fuller today.Encouraged to pump q 3 hours to prevent engorgement. States she has single electric pump from [employer] at home. Reviewed engorgement prevention and treatment. No questions at present. To call prn  Patient Name: Autumn [name] ZOXWR'UToday's Date: 12/01/2013 Reason for consult: Follow-up assessment   Maternal Data Formula Feeding for Exclusion: Yes Reason for exclusion: Mother's choice to formula and breast feed on admission  Feeding   LATCH Score/Interventions                      Lactation Tools Discussed/Used     Consult Status Consult Status: Complete    Pamelia HoitWeeks, Camdon Saetern D 12/01/2013, 11:22 AM

## 2013-12-11 ENCOUNTER — Encounter (HOSPITAL_COMMUNITY): Admission: RE | Payer: Self-pay | Source: Ambulatory Visit

## 2013-12-11 ENCOUNTER — Inpatient Hospital Stay (HOSPITAL_COMMUNITY)
Admission: RE | Admit: 2013-12-11 | Payer: PRIVATE HEALTH INSURANCE | Source: Ambulatory Visit | Admitting: Obstetrics and Gynecology

## 2013-12-11 SURGERY — Surgical Case
Anesthesia: Regional

## 2014-04-27 ENCOUNTER — Emergency Department (HOSPITAL_COMMUNITY)
Admission: EM | Admit: 2014-04-27 | Discharge: 2014-04-28 | Disposition: A | Discharge status: Home / Self Care | Payer: PRIVATE HEALTH INSURANCE | Attending: Emergency Medicine | Admitting: Emergency Medicine

## 2014-04-27 ENCOUNTER — Emergency Department (HOSPITAL_COMMUNITY): Payer: PRIVATE HEALTH INSURANCE

## 2014-04-27 ENCOUNTER — Encounter (HOSPITAL_COMMUNITY): Payer: Self-pay | Admitting: Emergency Medicine

## 2014-04-27 DIAGNOSIS — E669 Obesity, unspecified: Secondary | ICD-10-CM | POA: Insufficient documentation

## 2014-04-27 DIAGNOSIS — R011 Cardiac murmur, unspecified: Secondary | ICD-10-CM | POA: Insufficient documentation

## 2014-04-27 DIAGNOSIS — Z8619 Personal history of other infectious and parasitic diseases: Secondary | ICD-10-CM | POA: Insufficient documentation

## 2014-04-27 DIAGNOSIS — M25569 Pain in unspecified knee: Secondary | ICD-10-CM | POA: Insufficient documentation

## 2014-04-27 DIAGNOSIS — I10 Essential (primary) hypertension: Secondary | ICD-10-CM | POA: Insufficient documentation

## 2014-04-27 DIAGNOSIS — M25561 Pain in right knee: Secondary | ICD-10-CM

## 2014-04-27 DIAGNOSIS — Z79899 Other long term (current) drug therapy: Secondary | ICD-10-CM | POA: Insufficient documentation

## 2014-04-27 NOTE — ED Notes (Signed)
Pt. reports intermittent right knee pain for 2 weeks worse these past several days , denies injury/ambulatory .

## 2014-04-28 MED ORDER — TRAMADOL-ACETAMINOPHEN 37.5-325 MG PO TABS: 1.0000 | ORAL_TABLET | Freq: Four times a day (QID) | ORAL | Status: DC | PRN

## 2014-04-28 NOTE — ED Provider Notes (Signed)
CSN: [id number]     Arrival date & time 04/27/14  2228 History   First MD Initiated Contact with Patient 04/27/14 2300     Chief Complaint  Patient presents with  . Knee Pain     (Consider location/radiation/quality/duration/timing/severity/associated sxs/prior Treatment) Patient is a 28 y.o. female presenting with knee pain. The history is provided by the patient and medical records.  Knee Pain  This is a 27 year old female with past history significant for obesity and hypertension, presenting to the ED for right knee pain. Patient states pain has been intermittent over the past 2 weeks, but got worse over the past 3 days. 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 She does a lot of changing position from standing to sitting and squatting. She denies known injury, trauma, or falls. She denies prior right knee injuries or surgeries. She denies any fever or chills. No history of cancer or IV drug abuse. Patient has been taking Tylenol and Motrin at home without significant improvement.  Past Medical History  Diagnosis Date  . Leaky heart valve     mild MR by echo  . Heart murmur   . H/O varicella   . Obese   . Hypertension     stopped meds since pregnant   Past Surgical History  Procedure Laterality Date  . Cesarean section  05/14/2012    Procedure: CESAREAN SECTION;  Surgeon: Hal Morales, MD;  Location: [facility];  Service: Gynecology;  Laterality: N/A;  Primary cesarean section of baby girl    at 0533 APGAR 8/9  . Cesarean section N/A 11/28/2013    Procedure: CESAREAN SECTION;  Surgeon: Michael Litter, MD;  Location: [facility];  Service: Obstetrics;  Laterality: N/A;  . Bilateral salpingectomy Bilateral 11/28/2013    Procedure: BILATERAL SALPINGECTOMY;  Surgeon: Michael Litter, MD;  Location: [facility];  Service: Obstetrics;  Laterality: Bilateral;  Partial on Left   Family History  Problem Relation Age of Onset  . Hypertension Mother   . Cancer  Mother 18    breast  . Diabetes Sister   . Hypertension Sister   . Thyroid disease Sister   . Anemia Sister   . Anesthesia problems Neg Hx   . Heart disease Father    History  Substance Use Topics  . Smoking status: Never Smoker   . Smokeless tobacco: Never Used  . Alcohol Use: No   OB History   Grav Para Term Preterm Abortions TAB SAB Ect Mult Living   2 2 2  0 0 0 0 0 0 2     Review of Systems  Musculoskeletal: Positive for arthralgias.  All other systems reviewed and are negative.     Allergies  Review of patient's allergies indicates no known allergies.  Home Medications   Prior to Admission medications   Medication Sig Start Date End Date Taking? Authorizing Provider  acetaminophen (TYLENOL) 325 MG tablet Take 650 mg by mouth every 6 (six) hours as needed for mild pain.   Yes Historical Provider, MD  ibuprofen (ADVIL,MOTRIN) 200 MG tablet Take 200 mg by mouth every 6 (six) hours as needed for moderate pain.   Yes Historical Provider, MD  labetalol (NORMODYNE) 200 MG tablet Take 1 tablet (200 mg total) by mouth 2 (two) times daily. 12/01/13  Yes Naima A Dillard, MD   BP 149/70  Pulse 87  Temp(Src) 98.3 F (36.8 C) (Oral)  Resp 20  Ht 5'  4" (1.626 m)  Wt 320 lb (145.151 kg)  BMI 54.90 kg/m2  SpO2 97%  LMP 04/09/2014  Physical Exam  Nursing note and vitals reviewed. Constitutional: She is oriented to person, place, and time. She appears well-developed and well-nourished.  obese  HENT:  Head: Normocephalic and atraumatic.  Mouth/Throat: Oropharynx is clear and moist.  Eyes: Conjunctivae and EOM are normal. Pupils are equal, round, and reactive to light.  Neck: Normal range of motion.  Cardiovascular: Normal rate, regular rhythm and normal heart sounds.   Pulmonary/Chest: Effort normal and breath sounds normal. No respiratory distress. She has no wheezes.  Musculoskeletal: Normal range of motion.  Right knee with mild swelling and tenderness along medial  joint line, no appreciable effusion, erythema, or cellulitic changes; no gross bony deformities; full flexion and extension maintained with mild pain; DP pulse intact; ambulating unassisted without difficulty  Neurological: She is alert and oriented to person, place, and time.  Skin: Skin is warm and dry.  Psychiatric: She has a normal mood and affect.    ED Course  ORTHOPEDIC INJURY TREATMENT Date/Time: 04/28/2014 1:10 AM Performed by: Garlon HatchetSANDERS, Taisa Deloria M Authorized by: Garlon HatchetSANDERS, Yonas Bunda M Consent: Verbal consent obtained. Risks and benefits: risks, benefits and alternatives were discussed Consent given by: patient Patient understanding: patient states understanding of the procedure being performed Injury location: knee Location details: right knee Injury type: soft tissue Pre-procedure neurovascular assessment: neurovascularly intact Immobilization: brace Supplies used: elastic bandage Post-procedure neurovascular assessment: post-procedure neurovascularly intact   (including critical care time) Labs Review Labs Reviewed - No data to display  Imaging Review Dg Knee Complete 4 Views Right  04/27/2014   CLINICAL DATA:  KNEE PAIN  EXAM: RIGHT KNEE - COMPLETE 4+ VIEW  COMPARISON:  Prior study from 08/18/2003  FINDINGS: There is no evidence of fracture, dislocation, or joint effusion. There is no evidence of arthropathy or other focal bone abnormality. Soft tissues are unremarkable.  IMPRESSION: Normal radiographs of the right knee.   Electronically Signed   By: Rise MuBenjamin  McClintock M.D.   On: 04/27/2014 23:56     EKG Interpretation None      MDM   Final diagnoses:  Knee pain, right   Imaging negative for acute fx or dislocation.  No signs of septic joint on exam.  Leg remains NVI.  Knee wrapped with ace wrap, will have pt FU with orthopedics if no improvement of sx in 1 week or if symptoms worsen.  Rx  ultracet for pain.  Discussed plan with patient, he/she acknowledged understanding and  agreed with plan of care.  Return precautions given for new or worsening symptoms.  Garlon HatchetLisa M Jarrius [place], PA-C 04/28/14 [phone]

## 2014-04-28 NOTE — ED Provider Notes (Signed)
Medical screening examination/treatment/procedure(s) were performed by non-physician practitioner and as supervising physician I was immediately available for consultation/collaboration.   EKG Interpretation None       Doug SouSam Luian Schumpert, MD 04/28/14 [phone]

## 2014-04-28 NOTE — Discharge Instructions (Signed)
Take the prescribed medication as directed. Follow-up with Dr. Eulah PontMurphy if no improvement of symptoms within 1 week or if symptoms worsen. Return to the ED for new concerns.

## 2014-05-13 ENCOUNTER — Emergency Department (HOSPITAL_COMMUNITY)
Admission: EM | Admit: 2014-05-13 | Discharge: 2014-05-13 | Disposition: A | Discharge status: Home / Self Care | Payer: No Typology Code available for payment source | Attending: Emergency Medicine | Admitting: Emergency Medicine

## 2014-05-13 ENCOUNTER — Emergency Department (HOSPITAL_COMMUNITY): Payer: No Typology Code available for payment source

## 2014-05-13 ENCOUNTER — Encounter (HOSPITAL_COMMUNITY): Payer: Self-pay | Admitting: Emergency Medicine

## 2014-05-13 DIAGNOSIS — Y9389 Activity, other specified: Secondary | ICD-10-CM | POA: Diagnosis not present

## 2014-05-13 DIAGNOSIS — S8990XA Unspecified injury of unspecified lower leg, initial encounter: Secondary | ICD-10-CM | POA: Insufficient documentation

## 2014-05-13 DIAGNOSIS — S99919A Unspecified injury of unspecified ankle, initial encounter: Secondary | ICD-10-CM | POA: Diagnosis present

## 2014-05-13 DIAGNOSIS — R011 Cardiac murmur, unspecified: Secondary | ICD-10-CM | POA: Diagnosis not present

## 2014-05-13 DIAGNOSIS — E669 Obesity, unspecified: Secondary | ICD-10-CM | POA: Insufficient documentation

## 2014-05-13 DIAGNOSIS — I10 Essential (primary) hypertension: Secondary | ICD-10-CM | POA: Diagnosis not present

## 2014-05-13 DIAGNOSIS — M25561 Pain in right knee: Secondary | ICD-10-CM

## 2014-05-13 DIAGNOSIS — IMO0002 Reserved for concepts with insufficient information to code with codable children: Secondary | ICD-10-CM | POA: Insufficient documentation

## 2014-05-13 DIAGNOSIS — Z8619 Personal history of other infectious and parasitic diseases: Secondary | ICD-10-CM | POA: Diagnosis not present

## 2014-05-13 DIAGNOSIS — M545 Low back pain, unspecified: Secondary | ICD-10-CM

## 2014-05-13 DIAGNOSIS — Y92410 Unspecified street and highway as the place of occurrence of the external cause: Secondary | ICD-10-CM | POA: Diagnosis not present

## 2014-05-13 DIAGNOSIS — S99929A Unspecified injury of unspecified foot, initial encounter: Secondary | ICD-10-CM | POA: Diagnosis not present

## 2014-05-13 DIAGNOSIS — Z79899 Other long term (current) drug therapy: Secondary | ICD-10-CM | POA: Insufficient documentation

## 2014-05-13 MED ORDER — IBUPROFEN 800 MG PO TABS: 800.0000 mg | ORAL_TABLET | Freq: Three times a day (TID) | ORAL | Status: DC

## 2014-05-13 MED ORDER — IBUPROFEN 800 MG PO TABS
800.0000 mg | ORAL_TABLET | Freq: Once | ORAL | Status: AC
  Administered 2014-05-13: 800 mg via ORAL
  Filled 2014-05-13: qty 1
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
## 2014-05-13 NOTE — Discharge Instructions (Signed)
Knee Pain °The knee is the complex joint between your thigh and your lower leg. It is made up of bones, tendons, ligaments, and cartilage. The bones that make up the knee are: °· The femur in the thigh. °· The tibia and fibula in the lower leg. °· The patella or kneecap riding in the groove on the lower femur. °CAUSES  °Knee pain is a common complaint with many causes. A few of these causes are: °· Injury, such as: °¨ A ruptured ligament or tendon injury. °¨ Torn cartilage. °· Medical conditions, such as: °¨ Gout °¨ Arthritis °¨ Infections °· Overuse, over training, or overdoing a physical activity. °Knee pain can be minor or severe. Knee pain can accompany debilitating injury. Minor knee problems often respond well to self-care measures or get well on their own. More serious injuries may need medical intervention or even surgery. °SYMPTOMS °The knee is complex. Symptoms of knee problems can vary widely. Some of the problems are: °· Pain with movement and weight bearing. °· Swelling and tenderness. °· Buckling of the knee. °· Inability to straighten or extend your knee. °· Your knee locks and you cannot straighten it. °· Warmth and redness with pain and fever. °· Deformity or dislocation of the kneecap. °DIAGNOSIS  °Determining what is wrong may be very straight forward such as when there is an injury. It can also be challenging because of the complexity of the knee. Tests to make a diagnosis may include: °· Your caregiver taking a history and doing a physical exam. °· Routine X-rays can be used to rule out other problems. X-rays will not reveal a cartilage tear. Some injuries of the knee can be diagnosed by: °¨ Arthroscopy a surgical technique by which a small video camera is inserted through tiny incisions on the sides of the knee. This procedure is used to examine and repair internal knee joint problems. Tiny instruments can be used during arthroscopy to repair the torn knee cartilage (meniscus). °¨ Arthrography  is a radiology technique. A contrast liquid is directly injected into the knee joint. Internal structures of the knee joint then become visible on X-ray film. °¨ An MRI scan is a non X-ray radiology procedure in which magnetic fields and a computer produce two- or three-dimensional images of the inside of the knee. Cartilage tears are often visible using an MRI scanner. MRI scans have largely replaced arthrography in diagnosing cartilage tears of the knee. °· Blood work. °· Examination of the fluid that helps to lubricate the knee joint (synovial fluid). This is done by taking a sample out using a needle and a syringe. °TREATMENT °The treatment of knee problems depends on the cause. Some of these treatments are: °· Depending on the injury, proper casting, splinting, surgery, or physical therapy care will be needed. °· Give yourself adequate recovery time. Do not overuse your joints. If you begin to get sore during workout routines, back off. Slow down or do fewer repetitions. °· For repetitive activities such as cycling or running, maintain your strength and nutrition. °· Alternate muscle groups. For example, if you are a weight lifter, work the upper body on one day and the lower body the next. °· Either tight or weak muscles do not give the proper support for your knee. Tight or weak muscles do not absorb the stress placed on the knee joint. Keep the muscles surrounding the knee strong. °· Take care of mechanical problems. °¨ If you have flat feet, orthotics or special shoes may help.   See your caregiver if you need help. °¨ Arch supports, sometimes with wedges on the inner or outer aspect of the heel, can help. These can shift pressure away from the side of the knee most bothered by osteoarthritis. °¨ A brace called an "unloader" brace also may be used to help ease the pressure on the most arthritic side of the knee. °· If your caregiver has prescribed crutches, braces, wraps or ice, use as directed. The acronym  for this is PRICE. This means protection, rest, ice, compression, and elevation. °· Nonsteroidal anti-inflammatory drugs (NSAIDs), can help relieve pain. But if taken immediately after an injury, they may actually increase swelling. Take NSAIDs with food in your stomach. Stop them if you develop stomach problems. Do not take these if you have a history of ulcers, stomach pain, or bleeding from the bowel. Do not take without your caregiver's approval if you have problems with fluid retention, heart failure, or kidney problems. °· For ongoing knee problems, physical therapy may be helpful. °· Glucosamine and chondroitin are over-the-counter dietary supplements. Both may help relieve the pain of osteoarthritis in the knee. These medicines are different from the usual anti-inflammatory drugs. Glucosamine may decrease the rate of cartilage destruction. °· Injections of a corticosteroid drug into your knee joint may help reduce the symptoms of an arthritis flare-up. They may provide pain relief that lasts a few months. You may have to wait a few months between injections. The injections do have a small increased risk of infection, water retention, and elevated blood sugar levels. °· Hyaluronic acid injected into damaged joints may ease pain and provide lubrication. These injections may work by reducing inflammation. A series of shots may give relief for as long as 6 months. °· Topical painkillers. Applying certain ointments to your skin may help relieve the pain and stiffness of osteoarthritis. Ask your pharmacist for suggestions. Many over the-counter products are approved for temporary relief of arthritis pain. °· In some countries, doctors often prescribe topical NSAIDs for relief of chronic conditions such as arthritis and tendinitis. A review of treatment with NSAID creams found that they worked as well as oral medications but without the serious side effects. °PREVENTION °· Maintain a healthy weight. Extra pounds  put more strain on your joints. °· Get strong, stay limber. Weak muscles are a common cause of knee injuries. Stretching is important. Include flexibility exercises in your workouts. °· Be smart about exercise. If you have osteoarthritis, chronic knee pain or recurring injuries, you may need to change the way you exercise. This does not mean you have to stop being active. If your knees ache after jogging or playing basketball, consider switching to swimming, water aerobics, or other low-impact activities, at least for a few days a week. Sometimes limiting high-impact activities will provide relief. °· Make sure your shoes fit well. Choose footwear that is right for your sport. °· Protect your knees. Use the proper gear for knee-sensitive activities. Use kneepads when playing volleyball or laying carpet. Buckle your seat belt every time you drive. Most shattered kneecaps occur in car accidents. °· Rest when you are tired. °SEEK MEDICAL CARE IF:  °You have knee pain that is continual and does not seem to be getting better.  °SEEK IMMEDIATE MEDICAL CARE IF:  °Your knee joint feels hot to the touch and you have a high fever. °MAKE SURE YOU:  °· Understand these instructions. °· Will watch your condition. °· Will get help right away if you are not   doing well or get worse. Document Released: 07/09/2007 Document Revised: 12/04/2011 Document Reviewed: 07/09/2007 [facility] Patient Information 2015 [place], [place]. This information is not intended to replace advice given to you by your health care provider. Make sure you discuss any questions you have with your health care provider.   Back Pain, Adult Low back pain is very common. About 1 in 5 people have back pain.The cause of low back pain is rarely dangerous. The pain often gets better over time.About half of people with a sudden onset of back pain feel better in just 2 weeks. About 8 in 10 people feel better by 6 weeks.  CAUSES Some common causes of back pain  include:  Strain of the muscles or ligaments supporting the spine.  Wear and tear (degeneration) of the spinal discs.  Arthritis.  Direct injury to the back. DIAGNOSIS Most of the time, the direct cause of low back pain is not known.However, back pain can be treated effectively even when the exact cause of the pain is unknown.Answering your caregiver's questions about your overall health and symptoms is one of the most accurate ways to make sure the cause of your pain is not dangerous. If your caregiver needs more information, he or she may order lab work or imaging tests (X-rays or MRIs).However, even if imaging tests show changes in your back, this usually does not require surgery. HOME CARE INSTRUCTIONS For many people, back pain returns.Since low back pain is rarely dangerous, it is often a condition that people can learn to [facility] their own.   Remain active. It is stressful on the back to sit or stand in one place. Do not sit, drive, or stand in one place for more than 30 minutes at a time. Take short walks on level surfaces as soon as pain allows.Try to increase the length of time you walk each day.  Do not stay in bed.Resting more than 1 or 2 days can delay your recovery.  Do not avoid exercise or work.Your body is made to move.It is not dangerous to be active, even though your back may hurt.Your back will likely heal faster if you return to being active before your pain is gone.  Pay attention to your body when you bend and lift. Many people have less discomfortwhen lifting if they bend their knees, keep the load close to their bodies,and avoid twisting. Often, the most comfortable positions are those that put less stress on your recovering back.  Find a comfortable position to sleep. Use a firm mattress and lie on your side with your knees slightly bent. If you lie on your back, put a pillow under your knees.  Only take over-the-counter or prescription medicines as  directed by your caregiver. Over-the-counter medicines to reduce pain and inflammation are often the most helpful.Your caregiver may prescribe muscle relaxant drugs.These medicines help dull your pain so you can more quickly return to your normal activities and healthy exercise.  Put ice on the injured area.  Put ice in a plastic bag.  Place a towel between your skin and the bag.  Leave the ice on for 15-20 minutes, 03-04 times a day for the first 2 to 3 days. After that, ice and heat may be alternated to reduce pain and spasms.  Ask your caregiver about trying back exercises and gentle massage. This may be of some benefit.  Avoid feeling anxious or stressed.Stress increases muscle tension and can worsen back pain.It is important to recognize when you are anxious  or stressed and learn ways to manage it.Exercise is a great option. SEEK MEDICAL CARE IF:  You have pain that is not relieved with rest or medicine.  You have pain that does not improve in 1 week.  You have new symptoms.  You are generally not feeling well. SEEK IMMEDIATE MEDICAL CARE IF:   You have pain that radiates from your back into your legs.  You develop new bowel or bladder control problems.  You have unusual weakness or numbness in your arms or legs.  You develop nausea or vomiting.  You develop abdominal pain.  You feel faint. Document Released: 09/11/2005 Document Revised: 03/12/2012 Document Reviewed: 01/13/2014 [facility] Patient Information 2015 [place], [place]. This information is not intended to replace advice given to you by your health care provider. Make sure you discuss any questions you have with your health care provider.    Emergency Department Resource Guide 1) Find a Doctor and Pay Out of Pocket Although you won't have to find out who is covered by your insurance plan, it is a good idea to ask around and get recommendations. You will then need to call the office and see if the doctor you  have chosen will accept you as a new patient and what types of options they offer for patients who are self-pay. Some doctors offer discounts or will set up payment plans for their patients who do not have insurance, but you will need to ask so you aren't surprised when you get to your appointment.  2) Contact Your Local Health Department Not all health departments have doctors that can see patients for sick visits, but many do, so it is worth a call to see if yours does. If you don't know where your local health department is, you can check in your phone book. The CDC also has a tool to help you locate your state's health department, and many state websites also have listings of all of their local health departments.  3) Find a Walk-in Clinic If your illness is not likely to be very severe or complicated, you may want to try a walk in clinic. These are popping up all over the country in pharmacies, drugstores, and shopping centers. They're usually staffed by nurse practitioners or physician assistants that have been trained to treat common illnesses and complaints. They're usually fairly quick and inexpensive. However, if you have serious medical issues or chronic medical problems, these are probably not your best option.  No Primary Care Doctor: - Call Health Connect at  [phone] - they can help you locate a primary care doctor that  accepts your insurance, provides certain services, etc. - Physician Referral Service- [phone]  Chronic Pain Problems: Organization         Address  Phone   Notes  [facility]  [phone]) [phone] Patients need to be referred by their primary care doctor.   Medication Assistance: Organization         Address  Phone   Notes  [facility] [address] [place], [place] [phone] --Must be a resident of [facility] [place] -- Must have NO insurance coverage whatsoever (no Medicaid/ Medicare,  etc.) -- The pt. MUST have a primary care doctor that directs their care regularly and follows them in the community   [facility]  [phone]   [employer]  [phone]    Agencies that provide inexpensive medical care: Organization  Address  Phone   Notes  [facility]  [phone]   [facility]    [phone]   [facility] [address] [place], [place] [address] [phone]   [facility] [address], [place] [phone]   Planned Parenthood    [phone]   [facility]    [phone]   [facility]  [address], [place] Phone:  [phone], Fax:  [phone] Hours of Operation:  9 am - 6 pm, M-F.  Also accepts Medicaid/Medicare and self-pay.  [facility]  [address], [place] Phone: [phone], Fax: [phone]. Hours of Operation:  8:30 am - 5:30 pm, M-F.  Also accepts Medicaid and self-pay.  [facility] [place] [address], [place] Phone: [phone]   [facility] [address] Natasha Bence [place], [place] [phone], Ext. [phone] Mondays & Thursdays: 7-9 AM.  First 15 patients are seen on a first come, first serve basis.    Medicaid-accepting [facility] Providers:  Organization         Address  Phone   Notes  [facility] [address], [place] [phone] Also accepts self-pay patients.  [facility] [address] Laurell Josephs [place], [place]  [phone]   [facility] [address], [place] [phone]   [facility] [address] [place] [phone]   Renaye Rakers [address], [place]   [phone] Only accepts [place] patients after they have their name applied to their card.   Self-Pay (no  insurance) in [facility]:  Organization         Address  Phone   Notes  Sickle Cell Patients, [facility] [address] [place], [place] [phone]   [facility] [address] [place], [place] [phone]   [facility] [place]  [address], [place] [phone]   [facility]/Dr. Osei-Bonsu  [address], [place] or [address], [place] [phone] Phone number for both [place] and [place] locations is the same.  [facility] [address], [place] [phone]   [facility] [address] [place] or [address] [phone]   [facility] [address] [place] [phone], phone; [phone], fax Sees patients 1st and 3rd Saturday of every month.  Must not qualify for public or private insurance (i.e. Medicaid, Medicare, [place] Health Choice, Veterans' Benefits)  Household income should be no more than 200% of the poverty level The clinic cannot treat you if you are pregnant or think you are pregnant  Sexually transmitted diseases are not treated at the clinic.    Dental Care: Organization         Address  Phone  Notes  [facility] of [facility] [address] [place], [place] [phone] Accepts children up to age 82 who are enrolled in [place] or [place] Health Choice; pregnant women with a Medicaid card; and children who have applied for Medicaid or [place] Health Choice, but were declined, whose parents can pay a reduced fee at time of  service.  [facility] of [facility]  [address], [place] [phone] Accepts children up to age 57 who are enrolled in [place] or [place] Health Choice; pregnant women with a Medicaid card; and children who have applied for Medicaid or [place] Health Choice, but were  declined, whose parents can pay a reduced fee at time of service.  [facility]  [address] [place], [place] [phone] Patients are seen by appointment only. Walk-ins are not accepted. [facility] will see patients 5 years of age and older. Monday - Tuesday (8am-5pm) Most Wednesdays (8:30-5pm) $30 per visit, cash only  [facility]  [address], [facility] [phone] Patients are seen by appointment only. Walk-ins are not accepted. [facility] will see patients 29 years of age and older. One Wednesday Evening (Monthly: Volunteer Based).  $30 per visit, cash only  [employer]  [phone] for adults; Children under age 71, call Graduate Pediatric Dentistry at [phone]. Children aged 23-14, please call [phone] to request a pediatric application.  Dental services are provided in all areas of dental care including fillings, crowns and bridges, complete and partial dentures, implants, gum treatment, root canals, and extractions. Preventive care is also provided. Treatment is provided to both adults and children. Patients are selected via a lottery and there is often a waiting list.   [facility] [address], [place]  [phone] [email or web address]   [facility] [address] [place], [place] [phone], Ext. [phone] Second and Fourth Thursday of each month, opens at 6:30 AM; Clinic ends at 9 AM.  Patients are seen on a first-come first-served basis, and a limited number are seen during each clinic.   [facility]  [address] Ether Griffins [place], [place] [phone]   Eligibility Requirements You must have lived in [place], [place], or [place] counties for at least the last three months.   You cannot be eligible for state or federal sponsored [place], including [employer], [place], or [employer].   You generally cannot be  eligible for healthcare insurance through your employer.    How to apply: Eligibility screenings are held every Tuesday and Wednesday afternoon from 1:00 pm until 4:00 pm. You do not need an appointment for the interview!  [facility] [address] [place], [place] [phone]   [facility] Department  [phone]   [facility] Health Department  [phone]   [facility] Health Department  [phone]    Behavioral Health Resources in the Community: Intensive Outpatient Programs Organization         Address  Phone  Notes  [facility] [address], [place], [place] [phone]   [facility] [address] [place], [place] [phone]   [facility] [address] [place], [place]  [phone]   [facility] [address],  [place], [place] [phone] or [phone]   Substance Abuse Resources Organization         Address  Phone  Notes  [facility]  [phone]   [facility]  [phone]   [facility] [place]  [phone]   Floydene Flock  [phone]   Residential & Outpatient Substance Abuse Program  [phone]   Psychological Services Organization         Address  Phone  Notes  [facility]  [phone]   [facility]  [phone]   [facility] [address] [place] [phone] or [phone]    Mobile Crisis Teams Organization         Address  Phone  Notes  [facility], Mobile Crisis Care Unit  [phone]   [facility]  [address] [place], [place] [phone]   Doristine Locks [address] [place] [phone]    Self-Help/Support Groups Organization         Address  Phone             Notes  [facility] [place] - variety of support groups  336- [phone] Call for more information    Narcotics Anonymous (NA), [facility] [address], [employer] [place]  2 meetings at this location   Statistician         Address  Phone  Notes  [facility] [address],    [place]  [phone]   [facility]  [address] [place] [address], [place], [place] [phone]   [facility] [address] [place], [place] [phone] Admissions: 8am-3pm M-F  [facility] [address]    [place], [place] [phone]   [facility] [address] [place], [place], [place] [phone]   [facility] [address]  [place], [place] [phone]   [facility] - Intensive Outpatient [address] Laurell Josephs [address], [place], [place] [phone]   [facility] ([facility]iction [facility] [address] [place]  [place], [place] [phone] or [phone]   [facility] ([facility]) [address], [place], [place] [phone] Accepts Medicaid  [facility] [place] [address],  [place] [phone] Substance Abuse/Addiction Treatment   [facility] Organization         Address  Phone  Notes  [facility]  [phone]   Angie Fava, PhD [address] Ervin Knack [place], [place]   [phone] or [phone]   [facility]   [address] [place], [place] [phone]   [facility] [address] [place], [place] [phone] Insurance/Medicaid/sponsorship through [facility] [address]                                    [place], [place] [phone] Therapy/tele-psych/case  [facility] [address], [place] [phone]    Dr. Lolly Mustache  [phone]   [facility]  [employer] [facility] [place] [facility] 1) [address], [place] 2) [address], [place] 3)  [address], [place] [phone]  [phone]   [facility] Child Abuse Hotline [phone] or [phone] (After Hours)

## 2014-05-14 NOTE — ED Provider Notes (Signed)
  Medical screening examination/treatment/procedure(s) were performed by non-physician practitioner and as supervising physician I was immediately available for consultation/collaboration.   EKG Interpretation None         Gerhard Munchobert Halea Lieb, MD 05/14/14 [phone]

## 2014-07-27 ENCOUNTER — Encounter (HOSPITAL_COMMUNITY): Payer: Self-pay | Admitting: Emergency Medicine

## 2014-09-28 ENCOUNTER — Emergency Department (HOSPITAL_BASED_OUTPATIENT_CLINIC_OR_DEPARTMENT_OTHER): Payer: PRIVATE HEALTH INSURANCE

## 2014-09-28 ENCOUNTER — Encounter (HOSPITAL_BASED_OUTPATIENT_CLINIC_OR_DEPARTMENT_OTHER): Payer: Self-pay

## 2014-09-28 ENCOUNTER — Emergency Department (HOSPITAL_BASED_OUTPATIENT_CLINIC_OR_DEPARTMENT_OTHER): Payer: Self-pay

## 2014-09-28 ENCOUNTER — Emergency Department (HOSPITAL_BASED_OUTPATIENT_CLINIC_OR_DEPARTMENT_OTHER)
Admission: EM | Admit: 2014-09-28 | Discharge: 2014-09-29 | Disposition: A | Discharge status: Home / Self Care | Payer: Self-pay | Attending: Emergency Medicine | Admitting: Emergency Medicine

## 2014-09-28 DIAGNOSIS — R519 Headache, unspecified: Secondary | ICD-10-CM

## 2014-09-28 DIAGNOSIS — I10 Essential (primary) hypertension: Secondary | ICD-10-CM | POA: Insufficient documentation

## 2014-09-28 DIAGNOSIS — Z8619 Personal history of other infectious and parasitic diseases: Secondary | ICD-10-CM | POA: Insufficient documentation

## 2014-09-28 DIAGNOSIS — H539 Unspecified visual disturbance: Secondary | ICD-10-CM | POA: Insufficient documentation

## 2014-09-28 DIAGNOSIS — R51 Headache: Secondary | ICD-10-CM | POA: Insufficient documentation

## 2014-09-28 DIAGNOSIS — Z79899 Other long term (current) drug therapy: Secondary | ICD-10-CM | POA: Insufficient documentation

## 2014-09-28 DIAGNOSIS — J029 Acute pharyngitis, unspecified: Secondary | ICD-10-CM | POA: Insufficient documentation

## 2014-09-28 DIAGNOSIS — Z3202 Encounter for pregnancy test, result negative: Secondary | ICD-10-CM | POA: Insufficient documentation

## 2014-09-28 DIAGNOSIS — R011 Cardiac murmur, unspecified: Secondary | ICD-10-CM | POA: Insufficient documentation

## 2014-09-28 DIAGNOSIS — E669 Obesity, unspecified: Secondary | ICD-10-CM | POA: Insufficient documentation

## 2014-09-28 DIAGNOSIS — R42 Dizziness and giddiness: Secondary | ICD-10-CM | POA: Insufficient documentation

## 2014-09-28 DIAGNOSIS — R0789 Other chest pain: Secondary | ICD-10-CM | POA: Insufficient documentation

## 2014-09-28 LAB — URINALYSIS, ROUTINE W REFLEX MICROSCOPIC
GLUCOSE, UA: NEGATIVE mg/dL
HGB URINE DIPSTICK: NEGATIVE
KETONES UR: 15 mg/dL — AB
Leukocytes, UA: NEGATIVE
Nitrite: NEGATIVE
PH: 6 (ref 5.0–8.0)
PROTEIN: NEGATIVE mg/dL
Specific Gravity, Urine: 1.024 (ref 1.005–1.030)
Urobilinogen, UA: 2 mg/dL — ABNORMAL HIGH (ref 0.0–1.0)

## 2014-09-28 LAB — BASIC METABOLIC PANEL
Anion gap: 6 (ref 5–15)
BUN: 8 mg/dL (ref 6–23)
CHLORIDE: 102 meq/L (ref 96–112)
CO2: 27 mmol/L (ref 19–32)
Calcium: 8.7 mg/dL (ref 8.4–10.5)
Creatinine, Ser: 0.78 mg/dL (ref 0.50–1.10)
GFR calc Af Amer: >90 mL/min (ref >90)
Glucose, Bld: 94 mg/dL (ref 70–99)
POTASSIUM: 3.6 mmol/L (ref 3.5–5.1)
SODIUM: 135 mmol/L (ref 135–145)

## 2014-09-28 LAB — CBC
HEMATOCRIT: 33.1 % — AB (ref 36.0–46.0)
HEMOGLOBIN: 10.3 g/dL — AB (ref 12.0–15.0)
MCH: 24.7 pg — ABNORMAL LOW (ref 26.0–34.0)
MCHC: 31.1 g/dL (ref 30.0–36.0)
MCV: 79.4 fL (ref 78.0–100.0)
Platelets: 328 10*3/uL (ref 150–400)
RBC: 4.17 MIL/uL (ref 3.87–5.11)
RDW: 14.8 % (ref 11.5–15.5)
WBC: 9.6 10*3/uL (ref 4.0–10.5)

## 2014-09-28 LAB — TROPONIN I

## 2014-09-28 LAB — PREGNANCY, URINE: Preg Test, Ur: NEGATIVE

## 2014-09-28 MED ORDER — ASPIRIN 81 MG PO CHEW
324.0000 mg | CHEWABLE_TABLET | Freq: Once | ORAL | Status: AC
  Administered 2014-09-28: 324 mg via ORAL
  Filled 2014-09-28: qty 4

## 2014-09-28 MED ORDER — DIPHENHYDRAMINE HCL 50 MG/ML IJ SOLN
25.0000 mg | Freq: Once | INTRAMUSCULAR | Status: AC
  Administered 2014-09-28: 25 mg via INTRAVENOUS
  Filled 2014-09-28: qty 1

## 2014-09-28 MED ORDER — LABETALOL HCL 5 MG/ML IV SOLN
10.0000 mg | Freq: Once | INTRAVENOUS | Status: DC
  Filled 2014-09-28: qty 4

## 2014-09-28 MED ORDER — METOCLOPRAMIDE HCL 5 MG/ML IJ SOLN
10.0000 mg | Freq: Once | INTRAMUSCULAR | Status: AC
Start: 2014-09-28 — End: 2014-09-28
  Administered 2014-09-28: 10 mg via INTRAVENOUS
  Filled 2014-09-28: qty 2

## 2014-09-28 NOTE — ED Notes (Signed)
MD at bedside. 

## 2014-09-28 NOTE — ED Provider Notes (Signed)
CSN: [id number]     Arrival date & time 09/28/14  1906 History  This chart was scribed for Mirian Mo, MD by [place], ED Scribe. The patient was seen in [facility]. The patient's care was started at 7:28 PM.    Chief Complaint  Patient presents with  . Headache      Patient is a 29 y.o. female presenting with headaches. The history is provided by the patient. No language interpreter was used.  Headache Pain location:  Generalized Radiates to:  Does not radiate Duration:  2 days Progression:  Worsening Ineffective treatments:  None tried Associated symptoms: dizziness and sore throat   Associated symptoms: no cough and no fever   HPI Comments: Autumn [name] is a 29 y.o. female who presents to the Emergency Department complaining of headache onset 2 days ago with chest pain, blurred vision, and dizziness that has worsened through out the day. Chest pain described as "tight" and "dull". She states her BP has been running high for past 2 weeks. No complaints of cough or fever. Last BP reading was 180/120. History of hypertension and heart murmur.   Past Medical History  Diagnosis Date  . Leaky heart valve     mild MR by echo  . Heart murmur   . H/O varicella   . Obese   . Hypertension     stopped meds since pregnant   Past Surgical History  Procedure Laterality Date  . Cesarean section  05/14/2012    Procedure: CESAREAN SECTION;  Surgeon: Hal Morales, MD;  Location: [facility];  Service: Gynecology;  Laterality: N/A;  Primary cesarean section of baby girl    at 0533 APGAR 8/9  . Cesarean section N/A 11/28/2013    Procedure: CESAREAN SECTION;  Surgeon: Michael Litter, MD;  Location: [facility];  Service: Obstetrics;  Laterality: N/A;  . Bilateral salpingectomy Bilateral 11/28/2013    Procedure: BILATERAL SALPINGECTOMY;  Surgeon: Michael Litter, MD;  Location: [facility];  Service: Obstetrics;  Laterality: Bilateral;  Partial on Left   Family History  Problem Relation Age of  Onset  . Hypertension Mother   . Cancer Mother 81    breast  . Diabetes Sister   . Hypertension Sister   . Thyroid disease Sister   . Anemia Sister   . Anesthesia problems Neg Hx   . Heart disease Father    History  Substance Use Topics  . Smoking status: Never Smoker   . Smokeless tobacco: Never Used  . Alcohol Use: No   OB History    Gravida Para Term Preterm AB TAB SAB Ectopic Multiple Living   0 0 0 0 0 0 2     Review of Systems  Constitutional: Negative for fever.  HENT: Positive for sore throat.   Eyes: Positive for visual disturbance.  Respiratory: Positive for chest tightness. Negative for cough.   Cardiovascular: Positive for chest pain.  Neurological: Positive for dizziness and headaches.  All other systems reviewed and are negative.     Allergies  Review of patient's allergies indicates no known allergies.  Home Medications   Prior to Admission medications   Medication Sig Start Date End Date Taking? Authorizing Provider  acetaminophen (TYLENOL) 325 MG tablet Take 650 mg by mouth every 6 (six) hours as needed for mild pain.    Historical Provider, MD  ibuprofen (ADVIL,MOTRIN) 200 MG tablet Take 200 mg by mouth every 6 (six) hours as needed for moderate pain.  Historical Provider, MD  ibuprofen (ADVIL,MOTRIN) 800 MG tablet Take 1 tablet (800 mg total) by mouth 3 (three) times daily. 05/13/14   Monte Fantasia, PA-C  labetalol (NORMODYNE) 200 MG tablet Take 1 tablet (200 mg total) by mouth 2 (two) times daily. 12/01/13   Jaymes Graff, MD  traMADol-acetaminophen (ULTRACET) 37.5-325 MG per tablet Take 1 tablet by mouth every 6 (six) hours as needed for moderate pain.    Historical Provider, MD   BP 163/92 mmHg  Pulse 87  Temp(Src) 98.3 F (36.8 C) (Oral)  Resp 20  Ht 5\' 3"  (1.6 m)  Wt 298 lb (135.172 kg)  BMI 52.80 kg/m2  SpO2 100% Physical Exam  Constitutional: She is oriented to person, place, and time. She appears well-developed and  well-nourished.  HENT:  Head: Normocephalic and atraumatic.  Right Ear: External ear normal.  Left Ear: External ear normal.  Eyes: Conjunctivae and EOM are normal. Pupils are equal, round, and reactive to light.  Neck: Normal range of motion. Neck supple.  Cardiovascular: Normal rate, regular rhythm, normal heart sounds and intact distal pulses.   Pulmonary/Chest: Effort normal and breath sounds normal.  Abdominal: Soft. Bowel sounds are normal. There is no tenderness.  Musculoskeletal: Normal range of motion.  Neurological: She is alert and oriented to person, place, and time. She has normal strength and normal reflexes. No cranial nerve deficit or sensory deficit. Gait normal.  Skin: Skin is warm and dry.  Vitals reviewed.   ED Course  Procedures (including critical care time) Labs Review Labs Reviewed  URINALYSIS, ROUTINE W REFLEX MICROSCOPIC - Abnormal; Notable for the following:    Bilirubin Urine SMALL (*)    Ketones, ur 15 (*)    Urobilinogen, UA 2.0 (*)    All other components within normal limits  CBC - Abnormal; Notable for the following:    Hemoglobin 10.3 (*)    HCT 33.1 (*)    MCH 24.7 (*)    All other components within normal limits  PREGNANCY, URINE  BASIC METABOLIC PANEL  TROPONIN I    Imaging Review Dg Chest 2 View  09/28/2014   CLINICAL DATA:  Mid chest pain which began today, headache, history hypertension  EXAM: CHEST  2 VIEW  COMPARISON:  11/10/2011  FINDINGS: Upper normal heart size.  Slight pulmonary vascular congestion.  Mediastinal contours normal.  Lungs clear.  No pleural effusion or pneumothorax.  Bones unremarkable.  IMPRESSION: No acute abnormalities.   Electronically Signed   By: Ulyses Southward M.D.   On: 09/28/2014 20:39   Ct Head Wo Contrast  09/28/2014   CLINICAL DATA:  Headache for 2 weeks, chest pain.  Hypertension.  EXAM: CT HEAD WITHOUT CONTRAST  TECHNIQUE: Contiguous axial images were obtained from the base of the skull through the vertex  without intravenous contrast.  COMPARISON:  CT of the head June 25, 2013  FINDINGS: The ventricles and sulci are normal. No intraparenchymal hemorrhage, mass effect nor midline shift. No acute large vascular territory infarcts.  No abnormal extra-axial fluid collections. Basal cisterns are patent.  No skull fracture. The included ocular globes and orbital contents are non-suspicious. The mastoid aircells and included paranasal sinuses are well-aerated.  IMPRESSION: Normal noncontrast CT of the head, stable from June 25, 2013.   Electronically Signed   By: Awilda Metro   On: 09/28/2014 23:08     EKG Interpretation   Date/Time:  Monday September 28 2014 19:14:58 EST Ventricular Rate:  89 PR Interval:  142  QRS Duration: 78 QT Interval:  358 QTC Calculation: 435 R Axis:   52 Text Interpretation:  Normal sinus rhythm Normal ECG No significant change  since last tracing Confirmed by Mirian Mo [phone]) on 09/28/2014  7:36:43 PM     Medications  aspirin chewable tablet 324 mg (324 mg Oral Given 09/28/14 2000)  metoCLOPramide (REGLAN) injection 10 mg (10 mg Intravenous Given 09/28/14 2000)  diphenhydrAMINE (BENADRYL) injection 25 mg (25 mg Intravenous Given 09/28/14 2000)    7:31 PM- Treatment plan was discussed with patient who verbalizes understanding and agrees.   MDM   Final diagnoses:  Headache    29 y.o. female with pertinent PMH of obesity, HTN presents with headache, chest pain which she states is recurrent with prior episodes of hypertension. On arrival today patient's primary complaint is headache, however on review of systems she did state that she had chest pain as well. Vital signs and physical exam as above. No focal neurodeficits. Patient given Reglan and Benadryl with relief of hypertension and headache. Check baseline labs, EKG, head CT which were all unremarkable. As symptoms are resolved and there recurrent with prior hypertensive episodes, consider likely diagnosis  hypertensive urgency, doubt hypertensive emergency.  Discharged home in stable condition with standard return precautions..    I have reviewed all laboratory and imaging studies if ordered as above  1. Headache       I personally performed the services described in this documentation, which was scribed in my presence. The recorded information has been reviewed and is accurate.    Mirian Mo, MD 09/29/14 [phone]

## 2014-09-28 NOTE — ED Notes (Signed)
MD at bedside. 

## 2014-09-28 NOTE — Discharge Instructions (Signed)
Chest Pain (Nonspecific) °It is often hard to give a specific diagnosis for the cause of chest pain. There is always a chance that your pain could be related to something serious, such as a heart attack or a blood clot in the lungs. You need to follow up with your health care provider for further evaluation. °CAUSES  °· Heartburn. °· Pneumonia or bronchitis. °· Anxiety or stress. °· Inflammation around your heart (pericarditis) or lung (pleuritis or pleurisy). °· A blood clot in the lung. °· A collapsed lung (pneumothorax). It can develop suddenly on its own (spontaneous pneumothorax) or from trauma to the chest. °· Shingles infection (herpes zoster virus). °The chest wall is composed of bones, muscles, and cartilage. Any of these can be the source of the pain. °· The bones can be bruised by injury. °· The muscles or cartilage can be strained by coughing or overwork. °· The cartilage can be affected by inflammation and become sore (costochondritis). °DIAGNOSIS  °Lab tests or other studies may be needed to find the cause of your pain. Your health care provider may have you take a test called an ambulatory electrocardiogram (ECG). An ECG records your heartbeat patterns over a 24-hour period. You may also have other tests, such as: °· Transthoracic echocardiogram (TTE). During echocardiography, sound waves are used to evaluate how blood flows through your heart. °· Transesophageal echocardiogram (TEE). °· Cardiac monitoring. This allows your health care provider to monitor your heart rate and rhythm in real time. °· Holter monitor. This is a portable device that records your heartbeat and can help diagnose heart arrhythmias. It allows your health care provider to track your heart activity for several days, if needed. °· Stress tests by exercise or by giving medicine that makes the heart beat faster. °TREATMENT  °· Treatment depends on what may be causing your chest pain. Treatment may include: °¨ Acid blockers for  heartburn. °¨ Anti-inflammatory medicine. °¨ Pain medicine for inflammatory conditions. °¨ Antibiotics if an infection is present. °· You may be advised to change lifestyle habits. This includes stopping smoking and avoiding alcohol, caffeine, and chocolate. °· You may be advised to keep your head raised (elevated) when sleeping. This reduces the chance of acid going backward from your stomach into your esophagus. °Most of the time, nonspecific chest pain will improve within 2-3 days with rest and mild pain medicine.  °HOME CARE INSTRUCTIONS  °· If antibiotics were prescribed, take them as directed. Finish them even if you start to feel better. °· For the next few days, avoid physical activities that bring on chest pain. Continue physical activities as directed. °· Do not use any tobacco products, including cigarettes, chewing tobacco, or electronic cigarettes. °· Avoid drinking alcohol. °· Only take medicine as directed by your health care provider. °· Follow your health care provider's suggestions for further testing if your chest pain does not go away. °· Keep any follow-up appointments you made. If you do not go to an appointment, you could develop lasting (chronic) problems with pain. If there is any problem keeping an appointment, call to reschedule. °SEEK MEDICAL CARE IF:  °· Your chest pain does not go away, even after treatment. °· You have a rash with blisters on your chest. °· You have a fever. °SEEK IMMEDIATE MEDICAL CARE IF:  °· You have increased chest pain or pain that spreads to your arm, neck, jaw, back, or abdomen. °· You have shortness of breath. °· You have an increasing cough, or you cough   up blood.  You have severe back or abdominal pain.  You feel nauseous or vomit.  You have severe weakness.  You faint.  You have chills. This is an emergency. Do not wait to see if the pain will go away. Get medical help at once. Call your local emergency services (911 in [place] Do not drive  yourself to the hospital. MAKE SURE YOU:   Understand these instructions.  Will watch your condition.  Will get help right away if you are not doing well or get worse. Document Released: 06/21/2005 Document Revised: 09/16/2013 Document Reviewed: 04/16/2008 [facility] Patient Information 2015 [place], [place]. This information is not intended to replace advice given to you by your health care provider. Make sure you discuss any questions you have with your health care provider. General Headache Without Cause A headache is pain or discomfort felt around the head or neck area. The specific cause of a headache may not be found. There are many causes and types of headaches. A few common ones are:  Tension headaches.  Migraine headaches.  Cluster headaches.  Chronic daily headaches. HOME CARE INSTRUCTIONS   Keep all follow-up appointments with your caregiver or any specialist referral.  Only take over-the-counter or prescription medicines for pain or discomfort as directed by your caregiver.  Lie down in a dark, quiet room when you have a headache.  Keep a headache journal to find out what may trigger your migraine headaches. For example, write down:  What you eat and drink.  How much sleep you get.  Any change to your diet or medicines.  Try massage or other relaxation techniques.  Put ice packs or heat on the head and neck. Use these 3 to 4 times per day for 15 to 20 minutes each time, or as needed.  Limit stress.  Sit up straight, and do not tense your muscles.  Quit smoking if you smoke.  Limit alcohol use.  Decrease the amount of caffeine you drink, or stop drinking caffeine.  Eat and sleep on a regular schedule.  Get 7 to 9 hours of sleep, or as recommended by your caregiver.  Keep lights dim if bright lights bother you and make your headaches worse. SEEK MEDICAL CARE IF:   You have problems with the medicines you were prescribed.  Your medicines are not  working.  You have a change from the usual headache.  You have nausea or vomiting. SEEK IMMEDIATE MEDICAL CARE IF:   Your headache becomes severe.  You have a fever.  You have a stiff neck.  You have loss of vision.  You have muscular weakness or loss of muscle control.  You start losing your balance or have trouble walking.  You feel faint or pass out.  You have severe symptoms that are different from your first symptoms. MAKE SURE YOU:   Understand these instructions.  Will watch your condition.  Will get help right away if you are not doing well or get worse. Document Released: 09/11/2005 Document Revised: 12/04/2011 Document Reviewed: 09/27/2011 [facility] Patient Information 2015 [place], [place]. This information is not intended to replace advice given to you by your health care provider. Make sure you discuss any questions you have with your health care provider.   Emergency Department Resource Guide 1) Find a Doctor and Pay Out of Pocket Although you won't have to find out who is covered by your insurance plan, it is a good idea to ask around and get recommendations. You will then need to  to call the office and see if the doctor you have chosen will accept you as a new patient and what types of options they offer for patients who are self-pay. Some doctors offer discounts or will set up payment plans for their patients who do not have insurance, but you will need to ask so you aren't surprised when you get to your appointment. ° °2) Contact Your Local Health Department °Not all health departments have doctors that can see patients for sick visits, but many do, so it is worth a call to see if yours does. If you don't know where your local health department is, you can check in your phone book. The CDC also has a tool to help you locate your state's health department, and many state websites also have listings of all of their local health departments. ° °3) Find a Walk-in  Clinic °If your illness is not likely to be very severe or complicated, you may want to try a walk in clinic. These are popping up all over the country in pharmacies, drugstores, and shopping centers. They're usually staffed by nurse practitioners or physician assistants that have been trained to treat common illnesses and complaints. They're usually fairly quick and inexpensive. However, if you have serious medical issues or chronic medical problems, these are probably not your best option. ° °No Primary Care Doctor: °- Call Health Connect at  [phone] - they can help you locate a primary care doctor that  accepts your insurance, provides certain services, etc. °- Physician Referral Service- 1-800-533-3463 ° °Chronic Pain Problems: °Organization         Address  Phone   Notes  °[facility]  [phone] Patients need to be referred by their primary care doctor.  ° °Medication Assistance: °Organization         Address  Phone   Notes  °[place] [facility] [address] °[place], [place] [address] [phone]) [phone] --Must be a resident of [place] °-- Must have NO insurance coverage whatsoever (no Medicaid/ Medicare, etc.) °-- The pt. MUST have a primary care doctor that directs their care regularly and follows them in the community °  °[facility]  (866) 331-1348   °United Way  (888) 892-1162   ° °Agencies that provide inexpensive medical care: °Organization         Address  Phone   Notes  °[facility]  ([phone]) [phone]   °[facility]    ([phone]) [phone]   °[facility] [address] °[place], [place] [address] [phone]) [phone]   °[facility] [address], °[place] ([phone]) [phone]   °Planned Parenthood    ([phone]) 373-0678   °[facility]    ([phone]) 272-1050   °[facility] ° [address], [place] Phone:  ([phone]) [phone], Fax:  ([phone] Hours  of Operation:  9 am - 6 pm, M-F.  Also accepts Medicaid/Medicare and self-pay.  ° [facility] ° [address], [place] Phone: ([phone]) [phone], Fax: [phone]) [phone]. Hours of Operation:  8:30 am - 5:30 pm, M-F.  Also accepts Medicaid and self-pay.  °[facility] [place] [address], [place] Phone: ([phone]) [phone]   °[facility] [address], [place], [place] ([phone], Ext. [phone] Mondays & Thursdays: 7-9 AM.  First 15 patients are seen on a first come, first serve basis. °  ° °  [place] Providers:  Organization         Address  Phone   Notes  [facility] [address], [place] [phone] Also accepts self-pay patients.  [facility] [address], [place]  [phone]   [place], [address], [place] [phone]   [facility] [address], [place] [phone]   Lucianne Lei [address], [place]   [phone] Only accepts [place] Access [place] patients after they have their name applied to their card.   Self-Pay (no insurance) in [facility]:  Organization         Address  Phone   Notes  Sickle Cell Patients, [facility] [place] [phone]   [facility] [place] [phone]   [facility] [place]  [place], [place], [place] [phone]   [facility]/Dr. Osei-Bonsu  [address] [place] or [address], [place] [phone] Phone number for both [place] and [place] locations is the same.  Urgent Medical and [facility] [address] [place] [phone]   [facility] [address] [place] or [address] [phone]   [facility] [address], [place] [phone], phone; [phone], fax Sees patients 1st and 3rd Saturday of every month.  Must not qualify for public or private insurance (i.e. Medicaid, Medicare, [place] Health Choice, Veterans' Benefits)  Household income should be no more than 200% of the poverty level The clinic cannot treat you if you are pregnant or think you are pregnant  Sexually transmitted diseases are not treated at the clinic.    Dental Care: Organization         Address  Phone  Notes  [facility] [place] [phone] Accepts children up to age 60 who are enrolled in [place] or [place]; pregnant women with a Medicaid card; and children who have applied for Medicaid or [place] Health Choice, but were declined, whose parents can pay a reduced fee at time of service.  [facility] Department of [facility]  [address], [place] [phone] Accepts children up to age 26 who are enrolled in [place] or [place]; pregnant women with a Medicaid card; and children who have applied for Medicaid or [place] Health Choice, but were declined, whose parents can pay a reduced fee at time of service.  [facility]  [place] [phone] Patients are seen by appointment only. Walk-ins are not accepted. [place] will see patients 69 years of age and older. Monday - Tuesday (8am-5pm) Most Wednesdays (8:30-5pm) $30 per visit, cash only  [facility] Adult Dental Access PROGRAM  [address], [facility] [phone] Patients are seen by appointment only. Walk-ins are not accepted. [place] will see patients 30 years of age and older. One Wednesday Evening (Monthly: Volunteer Based).  $30 per visit, cash only  [place]  [phone] for adults; Children under age 44, call Graduate  Pediatric Dentistry at [phone]. Children aged 24-14, please call [phone] to request a  pediatric application.  Dental services are provided in all areas of dental care including fillings, crowns and bridges, complete and partial dentures, implants, gum treatment, root canals, and extractions. Preventive care is also provided. Treatment is provided to both adults and children. Patients are selected via a lottery and there is often a waiting list.   [facility] [address] [place]  [phone] [email or web address]   [facility] [address] [place], [place] [phone], Ext. [phone] Second and Fourth Thursday of each month, opens at 6:30 AM; Clinic ends at 9 AM.  Patients are seen on a first-come first-served basis, and a limited number are seen during each clinic.   [facility]  [address] Hillard Danker [place], [place] [phone]   Eligibility Requirements You must have lived in [place], [place], or [place] counties for at least the last three months.   You cannot be eligible for state or federal sponsored [employer], including [employer], [place], or [employer].   You generally cannot be eligible for healthcare insurance through your employer.    How to apply: Eligibility screenings are held every Tuesday and Wednesday afternoon from 1:00 pm until 4:00 pm. You do not need an appointment for the interview!  [facility] [address], [place], [place]   [place]  Impact Department  [place]  [phone]    Behavioral Health Resources in the Community: Intensive Outpatient Programs Organization         Address  Phone  Notes  Lavina Elmo. [address], [place], [place] [phone]   [facility] Outpatient [address] [place], [place]   [facility] [address], [place], [place]   [place] [address],  [place], [place] or [phone]   Substance Abuse Resources Organization         Address  Phone  Notes  [facility] Drug Services  [phone]   [place]  ([phone])[phone]   [facility] [place]   Chinita Pester  [phone]   Residential & Outpatient Substance Abuse Program  [phone]   Psychological Services Organization         Address  Phone  Notes  [facility] [place]  [place]  [phone]   [place] [address] [place] or [phone]    Mobile Crisis Teams Organization         Address  Phone  Notes  [facility], Mobile Crisis Care Unit  [phone]   [facility]  [address] [place], [place]   [place] [address] [place] [phone]    Self-Help/Support Groups Organization         Address  Phone             Notes  [place]. of [place] - variety of support groups  [place] Call for more information  Narcotics Anonymous (NA), [facility] [address], [employer] [place]  2 meetings at this location   Special educational needs teacher         Address  Phone  Notes  [facility] [place],    [place]  [place]  [place], [place],  [place], [place] [phone]   [facility] Facility [address] [place], [place] [phone] Admissions: 8am-3pm M-F  [facility] [address],    [place], [place] [phone]   [facility] [address] [place], [place], [place] [phone]   [facility] [address]  [place], [place] [phone]   [facility] - Intensive Outpatient [address] Laurell [address], [place], [place] [phone]   [facility] ([facility] [address] [place].,  [place], [place] [phone] or [phone]   [facility] ([facility]) [address] [place], [place] [phone] Accepts Medicaid  [facility] [address]  [place] [phone] Substance Abuse/Addiction Treatment   [facility] Organization         Address  Phone  Notes  [facility]  [phone]   Angie Fava, PhD [address] Ervin Knack [place], [place]   [phone] or [phone]   [facility]   [address] [place], [place] [phone]   [facility] [address], [place], [place] [phone] Insurance/Medicaid/sponsorship through [facility] and Families [address]                                    [place], [place] [phone] Therapy/tele-psych/case  [facility] [address], [place] [phone]    Dr. Lolly Mustache  [phone]   [facility]  United Way [facility] Dept. 1) [address] [place] 2) [address], [place] 3)  [address], [place] [phone]  [phone]   [facility] Child Abuse Hotline [phone] or [phone] (After Hours)

## 2014-09-28 NOTE — ED Notes (Addendum)
Pt reports ha, cp, blurred vision and dizziness for 2 weeks, episodes of L arm numbness.  Reports currently out of bp meds b/c insurance has not kicked in yet.

## 2014-11-17 ENCOUNTER — Other Ambulatory Visit: Payer: Self-pay | Admitting: Family Medicine

## 2014-11-17 ENCOUNTER — Ambulatory Visit
Admission: RE | Admit: 2014-11-17 | Discharge: 2014-11-17 | Disposition: A | Discharge status: Home / Self Care | Payer: Commercial Managed Care - PPO | Source: Ambulatory Visit | Attending: Family Medicine | Admitting: Family Medicine

## 2014-11-17 DIAGNOSIS — R0602 Shortness of breath: Secondary | ICD-10-CM
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
## 2015-12-14 ENCOUNTER — Ambulatory Visit: Payer: Self-pay | Admitting: Internal Medicine

## 2015-12-14 ENCOUNTER — Ambulatory Visit (INDEPENDENT_AMBULATORY_CARE_PROVIDER_SITE_OTHER): Payer: 59 | Admitting: Internal Medicine

## 2015-12-14 ENCOUNTER — Encounter: Payer: Self-pay | Admitting: Internal Medicine

## 2015-12-14 DIAGNOSIS — R079 Chest pain, unspecified: Secondary | ICD-10-CM

## 2015-12-14 DIAGNOSIS — I080 Rheumatic disorders of both mitral and aortic valves: Secondary | ICD-10-CM | POA: Diagnosis not present

## 2015-12-14 DIAGNOSIS — I38 Endocarditis, valve unspecified: Secondary | ICD-10-CM

## 2015-12-14 NOTE — Progress Notes (Signed)
OFFICE NOTE  Chief Complaint:  Chest pain  Primary Care Physician: No PCP Per Patient  HPI:  Autumn [name] is a 30 y.o. female who has a long-standing history of chest pain. In 2012 she underwent heart catheterization by Dr. Marni Griffon and was found to have mild, nonobstructive coronary artery disease. She's been on a number of medicines and treatments to try to help with her chest discomfort with no benefit. She was found to have "a leaky heart valve" per her report and indeed had mild mitral regurgitation with an EF of 54% by echo performed at [facility] cardiology in 2014. Subsequently she's been seeing Dr. Jacinto Halim who has been caring for her and essentially treating her hypertension. She's asked for repeat heart catheterizations but that has been declined per her report. He currently has her on bisoprolol and lisinopril HCTZ and blood pressure looks well controlled today at 132/88. She reports he also started her on Contrave for weight loss and she's lost about 20 pounds. She takes Lipitor for dyslipidemia. She reports episodes of dizziness, fatigue, weakness and heaviness in her chest area which he also reports pain in her legs and arms. The symptoms happen almost every day but occurs sporadically. When she has episodes she feels hot and flushed, diaphoretic and weak all over. That lasts for a short period of time and then resolves. They do tend to come in spells and she's had about a one month period where she had no episodes, especially after her first heart catheterization which was diagnostic only. Currently I do not have all of her records to review from her cardiologist. Sinus rhythm on her EKG today. There is history of mild heart disease in her father who may have had a heart attack. She also reports episodes of staring and aphasia. Her husband reports times where he talks to her and she stares blankly at him without being able to give any response. This can last several seconds up to minutes.  She has never seen a neurologist.  PMHx:  Past Medical History  Diagnosis Date  . Leaky heart valve     mild MR by echo  . Heart murmur   . H/O varicella   . Obese   . Hypertension     stopped meds since pregnant    Past Surgical History  Procedure Laterality Date  . Cesarean section  05/14/2012    Procedure: CESAREAN SECTION;  Surgeon: Hal Morales, MD;  Location: [facility];  Service: Gynecology;  Laterality: N/A;  Primary cesarean section of baby girl    at 0533 APGAR 8/9  . Cesarean section N/A 11/28/2013    Procedure: CESAREAN SECTION;  Surgeon: Michael Litter, MD;  Location: [facility];  Service: Obstetrics;  Laterality: N/A;  . Bilateral salpingectomy Bilateral 11/28/2013    Procedure: BILATERAL SALPINGECTOMY;  Surgeon: Michael Litter, MD;  Location: [facility];  Service: Obstetrics;  Laterality: Bilateral;  Partial on Left    FAMHx:  Family History  Problem Relation Age of Onset  . Hypertension Mother   . Cancer Mother 73    breast  . Heart failure Mother   . Diabetes Sister   . Hypertension Sister   . Thyroid disease Sister   . Anemia Sister   . Anesthesia problems Neg Hx   . Heart disease Father   . Heart attack Father     SOCHx:   reports that she has never smoked. She has never used smokeless tobacco. She reports that  she does not drink alcohol or use illicit drugs.  ALLERGIES:  No Known Allergies  ROS: Pertinent items noted in HPI and remainder of comprehensive ROS otherwise negative.  HOME MEDS: Current Outpatient Prescriptions  Medication Sig Dispense Refill  . atorvastatin (LIPITOR) 10 MG tablet Take 10 mg by mouth daily.  1  . bisoprolol (ZEBETA) 5 MG tablet Take 5 mg by mouth daily.  2  . CONTRAVE 8-90 MG TB12 1ST WEEK-1TAB AM,2ND WEEK-1TAB AM,1TAB PM,3RD WEEK-2TABS AM,1TAB PM,4TH WEEK 2TABS AM,2TABS PM  0  . lisinopril-hydrochlorothiazide (PRINZIDE,ZESTORETIC) 20-25 MG tablet Take 1 tablet by mouth every morning.  1   No current facility-administered  medications for this visit.    LABS/IMAGING: No results found for this or any previous visit (from the past 48 hour(s)). No results found.  WEIGHTS: Wt Readings from Last 3 Encounters:  12/14/15 311 lb 14.4 oz (141.477 kg)  09/28/14 298 lb (135.172 kg)  04/27/14 320 lb (145.151 kg)    VITALS: BP 132/88 mmHg  Pulse 95  Ht 5\' 4"  (1.626 m)  Wt 311 lb 14.4 oz (141.477 kg)  BMI 53.51 kg/m2  EXAM: General appearance: alert, no distress and morbidly obese Neck: no carotid bruit and no JVD Lungs: diminished breath sounds bilaterally Heart: regular rate and rhythm, S1, S2 normal and systolic murmur: early systolic 2/6, blowing at apex Abdomen: soft, non-tender; bowel sounds normal; no masses,  no organomegaly Extremities: extremities normal, atraumatic, no cyanosis or edema Pulses: 2+ and symmetric Skin: Skin color, texture, turgor normal. No rashes or lesions Neurologic: Grossly normal Psych: Pleasant, fast talker  EKG: Normal sinus rhythm at 95  ASSESSMENT: 1. Probable noncardiac chest pain-consider musculoskeletal pain such as fibromyalgia 2. Episodes of weakness, dizziness, loss of muscle power and staring spells - ? Autonomic dysfunction,? Multiple sclerosis, ? Absence seizure 3. Morbid obesity 4. Hypertension-controlled 5. Mitral regurgitation 6. Mild nonobstructive coronary disease by cath 2012  PLAN: 1.   Autumn [name] has an unusual constellation of symptoms which does not sound cardiac. Some of this may be related to autonomic dysfunction or there could be an underlying neurologic disorder driving it. Her pain does sound atypical and most of it sounds like it's in the chest wall. She does have some minimal heart disease which is unusual given her young age. I agree with attempts at aggressive lipid management as well as weight loss. I will request and review records from her cardiologist. I would not likely recommend any further cardiac workup if the records indicate no  revealing findings. She may benefit from a neurologic assessment.  Chrystie NoseKenneth C. Dayton Kenley, MD, [facility] Bridgeway[facility]ACC Attending Cardiologist [facility]  Chrystie NoseKenneth C Chanci Ojala 12/14/2015, 6:38 PM

## 2015-12-14 NOTE — Patient Instructions (Signed)
Your physician recommends that you schedule a follow-up appointment in ONE MONTH  We will request records from Dr. Jacinto HalimGanji

## 2015-12-15 ENCOUNTER — Encounter: Payer: Self-pay | Admitting: *Deleted

## 2015-12-15 ENCOUNTER — Telehealth: Payer: Self-pay | Admitting: Internal Medicine

## 2015-12-15 NOTE — Telephone Encounter (Signed)
Faxed Release signed by patient to Dr Yates DecampJay Ganji to obtain records as requested by Dr Rennis GoldenHilty.  Faxed 12/15/15

## 2015-12-16 ENCOUNTER — Telehealth: Payer: Self-pay | Admitting: Internal Medicine

## 2015-12-16 NOTE — Telephone Encounter (Signed)
[facility] as [facility] by Dr Rennis GoldenHilty.  Records given to Dr Rennis GoldenHilty for review and return to medical records for appointment on 01/23/16. lp

## 2016-01-13 ENCOUNTER — Ambulatory Visit (INDEPENDENT_AMBULATORY_CARE_PROVIDER_SITE_OTHER): Payer: 59 | Admitting: Internal Medicine

## 2016-01-13 ENCOUNTER — Encounter: Payer: Self-pay | Admitting: Internal Medicine

## 2016-01-13 VITALS — BP 140/72 | HR 86 | Ht 64.0 in | Wt 310.5 lb

## 2016-01-13 DIAGNOSIS — R531 Weakness: Secondary | ICD-10-CM

## 2016-01-13 DIAGNOSIS — M79606 Pain in leg, unspecified: Secondary | ICD-10-CM

## 2016-01-13 DIAGNOSIS — M79609 Pain in unspecified limb: Secondary | ICD-10-CM | POA: Insufficient documentation

## 2016-01-13 DIAGNOSIS — R079 Chest pain, unspecified: Secondary | ICD-10-CM

## 2016-01-13 DIAGNOSIS — M7918 Myalgia, other site: Secondary | ICD-10-CM

## 2016-01-13 DIAGNOSIS — M791 Myalgia: Secondary | ICD-10-CM | POA: Diagnosis not present

## 2016-01-13 NOTE — Progress Notes (Signed)
OFFICE NOTE  Chief Complaint:  Follow-up visit  Primary Care Physician: No PCP Per Patient  HPI:  Autumn [name] is a 30 y.o. female who has a long-standing history of chest pain. In 2012 she underwent heart catheterization by Dr. Marni Griffon and was found to have mild, nonobstructive coronary artery disease. She's been on a number of medicines and treatments to try to help with her chest discomfort with no benefit. She was found to have "a leaky heart valve" per her report and indeed had mild mitral regurgitation with an EF of 54% by echo performed at [facility] cardiology in 2014. Subsequently she's been seeing Dr. Jacinto Halim who has been caring for her and essentially treating her hypertension. She's asked for repeat heart catheterizations but that has been declined per her report. He currently has her on bisoprolol and lisinopril HCTZ and blood pressure looks well controlled today at 132/88. She reports he also started her on Contrave for weight loss and she's lost about 20 pounds. She takes Lipitor for dyslipidemia. She reports episodes of dizziness, fatigue, weakness and heaviness in her chest area which he also reports pain in her legs and arms. The symptoms happen almost every day but occurs sporadically. When she has episodes she feels hot and flushed, diaphoretic and weak all over. That lasts for a short period of time and then resolves. They do tend to come in spells and she's had about a one month period where she had no episodes, especially after her first heart catheterization which was diagnostic only. Currently I do not have all of her records to review from her cardiologist. Sinus rhythm on her EKG today. There is history of mild heart disease in her father who may have had a heart attack. She also reports episodes of staring and aphasia. Her husband reports times where he talks to her and she stares blankly at him without being able to give any response. This can last several seconds up to  minutes. She has never seen a neurologist.  Mr. Autumn [name] returns today for follow-up. I received records from her prior cardiologist and review them at length. She had cardiac catheterization 2010 which showed mild, nonobstructive coronary disease. She also had stress testing as mentioned recently at [facility] which was negative. She had an echocardiogram last year which showed normal LV systolic and diastolic function. Her chest pain seems noncardiac and is reproducible and she also reports similar pain in her legs. She is reported some episodes of staring spells as mentioned above as well as feeling hot, flushed and diaphoretic as well as having weakness and possible aphasia.  PMHx:  Past Medical History  Diagnosis Date  . Leaky heart valve     mild MR by echo  . Heart murmur   . H/O varicella   . Obese   . Hypertension     stopped meds since pregnant    Past Surgical History  Procedure Laterality Date  . Cesarean section  05/14/2012    Procedure: CESAREAN SECTION;  Surgeon: Hal Morales, MD;  Location: [facility];  Service: Gynecology;  Laterality: N/A;  Primary cesarean section of baby girl    at 0533 APGAR 8/9  . Cesarean section N/A 11/28/2013    Procedure: CESAREAN SECTION;  Surgeon: Michael Litter, MD;  Location: [facility];  Service: Obstetrics;  Laterality: N/A;  . Bilateral salpingectomy Bilateral 11/28/2013    Procedure: BILATERAL SALPINGECTOMY;  Surgeon: Michael Litter, MD;  Location: [facility];  Service: Obstetrics;  Laterality: Bilateral;  Partial on Left    FAMHx:  Family History  Problem Relation Age of Onset  . Hypertension Mother   . Breast cancer Mother [id number]  . Heart failure Mother   . Diabetes Sister   . Hypertension Sister   . Thyroid disease Sister   . Anemia Sister   . Anesthesia problems Neg Hx   . Heart disease Father   . Heart attack Father   . Heart murmur Sister   . Heart murmur Daughter     SOCHx:   reports that she has never smoked. She has never  used smokeless tobacco. She reports that she does not drink alcohol or use illicit drugs.  ALLERGIES:  No Known Allergies  ROS: Pertinent items noted in HPI and remainder of comprehensive ROS otherwise negative.  HOME MEDS: Current Outpatient Prescriptions  Medication Sig Dispense Refill  . atorvastatin (LIPITOR) 10 MG tablet Take 10 mg by mouth daily.  1  . bisoprolol (ZEBETA) 5 MG tablet Take 5 mg by mouth daily.  2  . CONTRAVE 8-90 MG TB12 1ST WEEK-1TAB AM,2ND WEEK-1TAB AM,1TAB PM,3RD WEEK-2TABS AM,1TAB PM,4TH WEEK 2TABS AM,2TABS PM  0  . lisinopril-hydrochlorothiazide (PRINZIDE,ZESTORETIC) 20-25 MG tablet Take 1 tablet by mouth every morning.  1   No current facility-administered medications for this visit.    LABS/IMAGING: No results found for this or any previous visit (from the past 48 hour(s)). No results found.  WEIGHTS: Wt Readings from Last 3 Encounters:  01/13/16 310 lb 8 oz (140.842 kg)  12/14/15 311 lb 14.4 oz (141.477 kg)  09/28/14 298 lb (135.172 kg)    VITALS: BP 140/72 mmHg  Pulse 86  Ht 5\' 4"  (1.626 m)  Wt 310 lb 8 oz (140.842 kg)  BMI 53.27 kg/m2  EXAM: Deferred  EKG: Deferred  ASSESSMENT: 1. Probable noncardiac chest pain-consider musculoskeletal pain such as fibromyalgia 2. Episodes of weakness, dizziness, loss of muscle power and staring spells - ? Autonomic dysfunction,? Multiple sclerosis, ? Absence seizure 3. Morbid obesity 4. Hypertension-controlled 5. Mitral regurgitation 6. Mild nonobstructive coronary disease by cath 2012  PLAN: 1.   Mrs. Autumn [name] is on appropriate therapy for mild nonobstructive coronary disease. Her symptoms does not sound cardiac in nature. I'm concerned more about a neuropathic pain syndrome or possible underlying seizure disorder or other cause of her aphasia, staring spells and other unusual symptoms. No further cardiac workup is indicated at this time. I will refer her to neurology for further  evaluation.  Chrystie NoseKenneth C. Sujey Gundry, MD, [facility] HospitalFACC Attending Cardiologist [facility]  Chrystie NoseKenneth C Raylin Winer 01/13/2016, 10:56 AM

## 2016-01-13 NOTE — Patient Instructions (Addendum)
You have been referred to Dr. Arbutus Leasat [facility])  Your physician recommends that you schedule a follow-up appointment as needed with Dr. Rennis GoldenHilty.

## 2016-02-02 ENCOUNTER — Ambulatory Visit: Payer: 59 | Admitting: Physician Assistant

## 2016-02-02 ENCOUNTER — Telehealth: Payer: Self-pay | Admitting: Internal Medicine

## 2016-02-02 NOTE — Telephone Encounter (Signed)
Pt c/o of Chest Pain: STAT if CP now or developed within 24 hours  Chest pain started this am- EMS arrived at pt work- pt did not want to go to ED- stated she must be seen ASAP   1. Are you having CP right now? Yes   2. Are you experiencing any other symptoms (ex. SOB, nausea, vomiting, sweating)? SOB, tingling feeling arms/legs, high BP- 190/94 (this am), lightheaded, headache  3. How long have you been experiencing CP? Started today   4. Is your CP continuous or coming and going? Come/goes  5. Have you taken Nitroglycerin? No- EMS gave her 4 baby aspirin  ?

## 2016-02-02 NOTE — Telephone Encounter (Signed)
Spoke with April, patient's employer. Patient has been experiencing chest pain, shortness of breath, elevated BP (symptoms list below and verified) EMS was called and the recommended transport to ED which patient refused Patient was evaluated in ED for similar symptoms per April [facility]'s [facility] since patient refused transport to hospital, that she get same-day appt Patient scheduled to see R. Barrett, PA for 02/02/16 @ 2pm Patient's parents will bring her to the appointment.

## 2016-02-02 NOTE — Telephone Encounter (Signed)
She canceled

## 2016-02-04 ENCOUNTER — Encounter: Payer: Self-pay | Admitting: Neurology

## 2016-02-04 ENCOUNTER — Other Ambulatory Visit (INDEPENDENT_AMBULATORY_CARE_PROVIDER_SITE_OTHER): Payer: 59

## 2016-02-04 ENCOUNTER — Ambulatory Visit (INDEPENDENT_AMBULATORY_CARE_PROVIDER_SITE_OTHER): Payer: 59 | Admitting: Neurology

## 2016-02-04 VITALS — BP 110/80 | HR 84 | Ht 64.0 in | Wt 312.4 lb

## 2016-02-04 DIAGNOSIS — R202 Paresthesia of skin: Secondary | ICD-10-CM

## 2016-02-04 DIAGNOSIS — M791 Myalgia, unspecified site: Secondary | ICD-10-CM

## 2016-02-04 DIAGNOSIS — R4789 Other speech disturbances: Secondary | ICD-10-CM

## 2016-02-04 LAB — TSH: TSH: 2.63 u[IU]/mL (ref 0.35–4.50)

## 2016-02-04 LAB — SEDIMENTATION RATE: SED RATE: 46 mm/h — AB (ref 0–22)

## 2016-02-04 LAB — VITAMIN B12: Vitamin B-12: 325 pg/mL (ref 211–911)

## 2016-02-04 NOTE — Progress Notes (Signed)
[facility] Clinic Note - Initial Visit   Date: 02/04/2016  Autumn [name] MRN: [id number] DOB: [birth year]   Dear Dr. Debara Pickett:  Thank you for your kind referral of Autumn [name] for consultation of generalized weakness and pain. Although her history is well known to you, please allow [place] to reiterate it for the purpose of our medical record. The patient was accompanied to the clinic by mother who also provides collateral information.     History of Present Illness: Autumn [name] is a 30 y.o. right-handed [place] American female with hyperlipidemia and hypertension presenting for evaluation of spells of aphasia and generalized pain.    Starting around 2015, she began having spells of elevated blood pressure, chest pressure, dizziness, lightheadedness, bilateral leg numbness, followed by expressive aphasia and numbness of the face.  Spells last about 5 minutes, occuring 3 times per week and usually always the same presentation.  She had two episodes on 5/10 for which she went to the emergency department in [facility].   She does not have loss of consciousness and is able to recall all the details.  No abnormal movements of the arms or legs.  There was no preceding illness or events that triggered these spells.    For her associated atypical chest pain/pressure, she underwent cardiac catherization in 2012 which showed mild, nonobstructive coronary artery disease.  She has tried a number of various cardiac medications without benefit.    She denies any history of head trauma.    She also complains of bilateral leg, described as achy and tender pain.  She takes tylenol without any benefit.  She had some relief with compression stocking but does not use it any more.  She has intermittent numbness and tingling of the entire lower legs.  She endorses generalized weakness, especially the left side.  She walks independently and has not had any falls.    RISK  FACTORS FOR SEIZURES:   1. Head Trauma NO; 2. CNS Infections NO; 3. Family History of Seizures NO; 4. Developmental Delay NO; 5. Febrile Seizures NO; 6. CNS Tumors NO; 7. CNS Vascular Disease NO  Out-side paper records, electronic medical record, and images have been reviewed where available and summarized as:  CT head wo contrast 09/28/2014:  Normal noncontrast CT of the head, stable from June 25, 2013.  Lab Results  Component Value Date   HGBA1C 6.0* 05/12/2012   Lab Results  Component Value Date   TSH 2.95 02/12/2009    Past Medical History  Diagnosis Date  . Leaky heart valve     mild MR by echo  . Heart murmur   . H/O varicella   . Obese   . Hypertension     stopped meds since pregnant    Past Surgical History  Procedure Laterality Date  . Cesarean section  05/14/2012    Procedure: CESAREAN SECTION;  Surgeon: Eldred Manges, MD;  Location: [facility];  Service: Gynecology;  Laterality: N/A;  Primary cesarean section of baby girl    at [place] 8/9  . Cesarean section N/A 11/28/2013    Procedure: CESAREAN SECTION;  Surgeon: Betsy Coder, MD;  Location: [facility];  Service: Obstetrics;  Laterality: N/A;  . Bilateral salpingectomy Bilateral 11/28/2013    Procedure: BILATERAL SALPINGECTOMY;  Surgeon: Betsy Coder, MD;  Location: [facility];  Service: Obstetrics;  Laterality: Bilateral;  Partial on Left     Medications:  Outpatient Encounter Prescriptions as of 02/04/2016  Medication Sig Note  . atorvastatin (LIPITOR) 10 MG tablet Take 10 mg by mouth daily. 12/14/2015: Received from: External Pharmacy  . bisoprolol (ZEBETA) 5 MG tablet Take 5 mg by mouth daily. 12/14/2015: Received from: External Pharmacy  . CONTRAVE 8-90 MG TB12 1ST WEEK-1TAB AM,2ND WEEK-1TAB AM,1TAB PM,3RD WEEK-2TABS AM,1TAB PM,4TH WEEK 2TABS AM,2TABS PM 12/14/2015: Received from: External Pharmacy  . lisinopril-hydrochlorothiazide (PRINZIDE,ZESTORETIC) 20-25 MG tablet Take 1 tablet by mouth every morning.  12/14/2015: Received from: External Pharmacy   No facility-administered encounter medications on file as of 02/04/2016.     Allergies: No Known Allergies  Family History: Family History  Problem Relation Age of Onset  . Hypertension Mother   . Breast cancer Mother 72  . Heart failure Mother   . Diabetes Sister   . Hypertension Sister   . Thyroid disease Sister   . Anemia Sister   . Anesthesia problems Neg Hx   . Heart disease Father   . Heart attack Father   . Heart murmur Sister   . Heart murmur Daughter     Social History: Social History  Substance Use Topics  . Smoking status: Never Smoker   . Smokeless tobacco: Never Used  . Alcohol Use: No   Social History   Social History Narrative   Lives with husband and 2 children in a one story home.     Works as an Building control surveyor.     Education: college degree.    Review of Systems:  CONSTITUTIONAL: No fevers, chills, night sweats, or weight loss.   EYES: No visual changes or eye pain ENT: No hearing changes.  No history of nose bleeds.   RESPIRATORY: No cough, wheezing and shortness of breath.   CARDIOVASCULAR: Negative for chest pain, and palpitations.   GI: Negative for abdominal discomfort, blood in stools or black stools.  No recent change in bowel habits.   GU:  No history of incontinence.   MUSCLOSKELETAL: No history of joint pain or swelling.  +myalgias.   SKIN: Negative for lesions, rash, and itching.   HEMATOLOGY/ONCOLOGY: Negative for prolonged bleeding, bruising easily, and swollen nodes.  No history of cancer.   ENDOCRINE: Negative for cold or heat intolerance, polydipsia or goiter.   PSYCH:  No depression or anxiety symptoms.   NEURO: As Above.   Vital Signs:  BP 110/80 mmHg  Pulse 84  Ht 5' 4"  (1.626 m)  Wt 312 lb 6 oz (141.692 kg)  BMI 53.59 kg/m2  SpO2 99%   General Medical Exam:   General:  Well appearing, comfortable.   Eyes/ENT: see cranial nerve examination.   Neck: No masses  appreciated.  Full range of motion without tenderness.  No carotid bruits. Respiratory:  Clear to auscultation, good air entry bilaterally.   Cardiac:  Regular rate and rhythm, no murmur.   Extremities:  No deformities, edema, or skin discoloration.  Skin:  No rashes or lesions.  Neurological Exam: MENTAL STATUS including orientation to time, place, person, recent and remote memory, attention span and concentration, language, and fund of knowledge is normal.  Speech is not dysarthric.  CRANIAL NERVES: II:  No visual field defects.  Unremarkable fundi.   III-IV-VI: Pupils equal round and reactive to light.  Normal conjugate, extra-ocular eye movements in all directions of gaze.  No nystagmus.  No ptosis.   V:  Normal facial sensation.    VII:  Normal facial symmetry and movements.  No pathologic facial reflexes.  VIII:  Normal hearing and vestibular function.  IX-X:  Normal palatal movement.   XI:  Normal shoulder shrug and head rotation.   XII:  Normal tongue strength and range of motion, no deviation or fasciculation.  MOTOR:  No atrophy, fasciculations or abnormal movements.  No pronator drift.  Tone is normal.    Right Upper Extremity:    Left Upper Extremity:    Deltoid  5/5   Deltoid  5/5   Biceps  5/5   Biceps  5/5   Triceps  5/5   Triceps  5/5   Wrist extensors  5/5   Wrist extensors  5/5   Wrist flexors  5/5   Wrist flexors  5/5   Finger extensors  5/5   Finger extensors  5/5   Finger flexors  5/5   Finger flexors  5/5   Dorsal interossei  5/5   Dorsal interossei  5/5   Abductor pollicis  5/5   Abductor pollicis  5/5   Tone (Ashworth scale)  0  Tone (Ashworth scale)  0   Right Lower Extremity:    Left Lower Extremity:    Hip flexors  5/5   Hip flexors  5/5   Hip extensors  5/5   Hip extensors  5/5   Knee flexors  5/5   Knee flexors  5/5   Knee extensors  5/5   Knee extensors  5/5   Dorsiflexors  5/5   Dorsiflexors  5/5   Plantarflexors  5/5   Plantarflexors  5/5   Toe  extensors  5/5   Toe extensors  5/5   Toe flexors  5/5   Toe flexors  5/5   Tone (Ashworth scale)  0  Tone (Ashworth scale)  0   MSRs:  Right                                                                 Left brachioradialis 2+  brachioradialis 2+  biceps 2+  biceps 2+  triceps 2+  triceps 2+  patellar 2+  patellar 2+  ankle jerk 2+  ankle jerk 2+  Hoffman no  Hoffman no  plantar response down  plantar response down   SENSORY:  Normal and symmetric perception of light touch, pinprick, vibration, and proprioception.   COORDINATION/GAIT: Normal finger-to- nose-finger and heel-to-shin.  Intact rapid alternating movements bilaterally.  Able to rise from a chair without using arms.  Gait is wide-based due to body habitus.  Tandem and stressed gait intact.    IMPRESSION: 1.  Spells of generalized numbness, dizziness, and speech arrest.  Duration of spells are longer than expected for seizure disorder, such as absence seizures, but since they are stereotyped, it is prudent to evaluation for seizures.  She will have routine EEG performed, if she does not have a spell during this testing, she will need ambulatory EEG.  MRI brain will be ordered if there is abnormal epileptiform activity on her EEG. With her normal exam, I do not feel that it is warranted at this time.   2.  Generalized myalgias and paresthesias.  With a normal neurological exam, my suspicion for neuropathy or myopathy is low.  NCS/EMG of the left arm and leg will be performed to better characterize the nature of her symptoms.  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  Lab testing for TSH, ANA, vitamin B12, and ESR will be ordered.    Return to clinic in 3 months.  The duration of this appointment visit was 40 minutes of face-to-face time with the patient.  Greater than 50% of this time was spent in counseling, explanation of diagnosis, planning of further management, and coordination of care.   Thank you for  allowing me to participate in patient's care.  If I can answer any additional questions, I would be pleased to do so.    Sincerely,    Estrellita Lasky K. Posey Pronto, DO

## 2016-02-04 NOTE — Patient Instructions (Addendum)
1.  Check TSH, ANA, vitamin B12, ESR 2.  NCS/EMG of the left arm and leg 3.  Routine EEG  Return to clinic in 3 months

## 2016-02-08 LAB — ANA: ANA: POSITIVE — AB

## 2016-02-08 LAB — ANTI-NUCLEAR AB-TITER (ANA TITER): ANA Titer 1: 1:40 {titer} — ABNORMAL HIGH

## 2016-02-10 ENCOUNTER — Ambulatory Visit (INDEPENDENT_AMBULATORY_CARE_PROVIDER_SITE_OTHER): Payer: 59 | Admitting: Neurology

## 2016-02-10 DIAGNOSIS — R4789 Other speech disturbances: Secondary | ICD-10-CM

## 2016-02-10 DIAGNOSIS — R202 Paresthesia of skin: Secondary | ICD-10-CM

## 2016-02-10 DIAGNOSIS — M791 Myalgia, unspecified site: Secondary | ICD-10-CM

## 2016-02-10 NOTE — Procedures (Signed)
TECHNICAL SUMMARY:  A multichannel referential and bipolar montage EEG using the standard international 10-20 system was performed on the patient described as awake, drowsy and asleep.  The dominant background activity consists of 11-12 hertz activity seen most prominantly over the posterior head region.  The backgound activity is reactive to eye opening and closing procedures.  Low voltage fast (beta) activity is distributed symmetrically and maximally over the anterior head regions.  ACTIVATION:  Stepwise photic stimulation at 4-20 flashes per second was performed and did not elicit any abnormal waveforms.  Hyperventilation was performed for 3 minutes with good patient effort and produced no changes in the background activity.  EPILEPTIFORM ACTIVITY:  There were no spikes, sharp waves or paroxysmal activity.  SLEEP:  Stage I and stage II sleep architecture identified.  CARDIAC:  The EKG lead revealed a regular sinus rhythm.  IMPRESSION:  This is a normal EEG for the patients stated age.  There were no focal, hemispheric or lateralizing features.  No epileptiform activity was recorded.  A normal EEG does not exclude the diagnosis of a seizure disorder and if seizure remains high on the list of differential diagnosis, an ambulatory EEG may be of value.  Clinical correlation is required.

## 2016-02-15 ENCOUNTER — Ambulatory Visit (INDEPENDENT_AMBULATORY_CARE_PROVIDER_SITE_OTHER): Payer: 59 | Admitting: Neurology

## 2016-02-15 ENCOUNTER — Telehealth: Payer: Self-pay | Admitting: Neurology

## 2016-02-15 DIAGNOSIS — M79602 Pain in left arm: Secondary | ICD-10-CM

## 2016-02-15 DIAGNOSIS — R202 Paresthesia of skin: Secondary | ICD-10-CM

## 2016-02-15 DIAGNOSIS — M79601 Pain in right arm: Secondary | ICD-10-CM

## 2016-02-15 DIAGNOSIS — R4789 Other speech disturbances: Secondary | ICD-10-CM

## 2016-02-15 DIAGNOSIS — M791 Myalgia, unspecified site: Secondary | ICD-10-CM

## 2016-02-15 DIAGNOSIS — G5602 Carpal tunnel syndrome, left upper limb: Secondary | ICD-10-CM

## 2016-02-15 NOTE — Telephone Encounter (Signed)
Referral faxed

## 2016-02-15 NOTE — Procedures (Addendum)
[facility]  [address], [address]  [place], [place] [address] Tel: [phone] Fax:  [phone] Test Date:  02/15/2016  Patient: Autumn FurnishJacinta [name] DOB: 06/29/1986 Physician: Nita Sickleonika Patel, DO  Sex: Female Height: 5\' 6"  Ref Phys: Nita Sickleonika Patel, DO  ID#: [id number] Temp: 33.9C Technician: Judie PetitM. Dean   Patient Complaints: This is a 30 year old female referred for evaluation of generalized myalgias and paresthesias.  NCV & EMG Findings: Extensive electrodiagnostic testing of the left upper and lower extremity shows: 1. Left median sensory nerve shows prolonged distal peak latency (3.8 ms) and reduced amplitude (13.0 V).  Left ulnar sensory response is within normal limits. 2. Left median motor nerve showed prolonged distal onset latency (4.1 ms) and normal amplitude. Left ulnar motor responses within normal limits. 3. Left sural and superficial peroneal sensory responses are within normal limits. 4. Left tibial and peroneal motor responses are within normal limits. 5. There is no evidence of active or chronic motor axon loss changes affecting any of the tested muscles. Motor unit configuration and recruitment pattern is within normal limits.  Impression: 1. Left median neuropathy at or distal to the wrist, consistent with the clinical diagnosis of carpal tunnel syndrome. Overall, these findings are moderate in degree electrically. 2. There is no evidence of a diffuse myopathy, cervical/lumbosacral radiculopathy, or sensorimotor polyneuropathy.   ___________________________ Nita Sickleonika Patel, DO    Nerve Conduction Studies Anti Sensory Summary Table   Stim Site NR Peak (ms) Norm Peak (ms) P-T Amp (V) Norm P-T Amp  Left Median Anti Sensory (2nd Digit)  33.9C  Wrist    3.8 <3.4 13.0 >20  Left Sup Peroneal Anti Sensory (Ant Lat Mall)  33.9C  12 cm    3.9 <4.5 12.2 >5  Left Sural Anti Sensory (Lat Mall)  33.9C  Calf    3.5 <4.5 7.2 >5  Left Ulnar Anti Sensory (5th Digit)   33.9C  Wrist    2.8 <3.1 22.6 >12   Motor Summary Table   Stim Site NR Onset (ms) Norm Onset (ms) O-P Amp (mV) Norm O-P Amp Site1 Site2 Delta-0 (ms) Dist (cm) Vel (m/s) Norm Vel (m/s)  Left Median Motor (Abd Poll Brev)  33.9C  Wrist    4.1 <3.9 13.7 >6 Elbow Wrist 4.1 22.0 54 >50  Elbow    8.2  13.6         Left Peroneal Motor (Ext Dig Brev)  33.9C  Ankle    2.6 <5.5 6.5 >3 B Fib Ankle 7.0 34.0 49 >40  B Fib    9.6  5.8  Poplt B Fib 1.7 10.0 59 >40  Poplt    11.3  5.5         Left Tibial Motor (Abd Hall Brev)  33.9C    body habitus behind knee  Ankle    3.3 <6.0 8.2 >8 Knee Ankle 7.8 37.0 47 >40  Knee    11.1  3.5         Left Ulnar Motor (Abd Dig Minimi)  33.9C  Wrist    2.7 <3.1 10.9 >7 B Elbow Wrist 3.5 19.0 54 >50  B Elbow    6.2  10.5  A Elbow B Elbow 1.7 10.0 59 >50  A Elbow    7.9  10.1          EMG   Side Muscle Ins Act Fibs Psw Fasc Number Recrt Dur Dur. Amp Amp. Poly Poly. Comment  Left 1stDorInt Nml Nml Nml Nml Nml Nml  Nml Nml Nml Nml Nml Nml N/A  Left AntTibialis Nml Nml Nml Nml Nml Nml Nml Nml Nml Nml Nml Nml N/A  Left Gastroc Nml Nml Nml Nml Nml Nml Nml Nml Nml Nml Nml Nml N/A  Left RectFemoris Nml Nml Nml Nml Nml Nml Nml Nml Nml Nml Nml Nml N/A  Left GluteusMed Nml Nml Nml Nml Nml Nml Nml Nml Nml Nml Nml Nml N/A  Left Abd Poll Brev Nml Nml Nml Nml Nml Nml Nml Nml Nml Nml Nml Nml N/A  Left PronatorTeres Nml Nml Nml Nml Nml Nml Nml Nml Nml Nml Nml Nml N/A  Left Biceps Nml Nml Nml Nml Nml Nml Nml Nml Nml Nml Nml Nml N/A  Left Triceps Nml Nml Nml Nml Nml Nml Nml Nml Nml Nml Nml Nml N/A  Left Deltoid Nml Nml Nml Nml Nml Nml Nml Nml Nml Nml Nml Nml N/A  Left Lumbo Parasp Low Nml Nml Nml Nml Nml Nml Nml Nml Nml Nml Nml Nml N/A      Waveforms:

## 2016-02-15 NOTE — Telephone Encounter (Signed)
Discuss the results of EEG which was normal. She has mild carpal tunnel on the left and was recommended to start using wrist splints. There was no evidence of myopathy or lumbosacral radiculopathy which could account for her generalized pain. With her elevated ESR (46) and borderline positive ANA, I would like for her to be evaluated by rheumatology for her myalgias.  Autumn [name] K. Posey Pronto, DO

## 2016-05-08 ENCOUNTER — Ambulatory Visit: Payer: 59 | Admitting: Neurology

## 2016-05-08 DIAGNOSIS — Z029 Encounter for administrative examinations, unspecified: Secondary | ICD-10-CM

## 2017-05-01 ENCOUNTER — Emergency Department (HOSPITAL_COMMUNITY): Payer: Medicaid Other

## 2017-05-01 ENCOUNTER — Inpatient Hospital Stay (HOSPITAL_COMMUNITY)
Admission: EM | Admit: 2017-05-01 | Discharge: 2017-05-03 | DRG: 103 | Disposition: A | Discharge status: Home / Self Care | Payer: Medicaid Other | Attending: Internal Medicine | Admitting: Internal Medicine

## 2017-05-01 DIAGNOSIS — R519 Headache, unspecified: Secondary | ICD-10-CM

## 2017-05-01 DIAGNOSIS — Z7982 Long term (current) use of aspirin: Secondary | ICD-10-CM

## 2017-05-01 DIAGNOSIS — D509 Iron deficiency anemia, unspecified: Secondary | ICD-10-CM | POA: Diagnosis present

## 2017-05-01 DIAGNOSIS — R29700 NIHSS score 0: Secondary | ICD-10-CM | POA: Diagnosis present

## 2017-05-01 DIAGNOSIS — E1165 Type 2 diabetes mellitus with hyperglycemia: Secondary | ICD-10-CM | POA: Diagnosis present

## 2017-05-01 DIAGNOSIS — R531 Weakness: Secondary | ICD-10-CM

## 2017-05-01 DIAGNOSIS — R404 Transient alteration of awareness: Secondary | ICD-10-CM

## 2017-05-01 DIAGNOSIS — R51 Headache: Secondary | ICD-10-CM

## 2017-05-01 DIAGNOSIS — E119 Type 2 diabetes mellitus without complications: Secondary | ICD-10-CM

## 2017-05-01 DIAGNOSIS — G43109 Migraine with aura, not intractable, without status migrainosus: Principal | ICD-10-CM | POA: Diagnosis present

## 2017-05-01 DIAGNOSIS — R4701 Aphasia: Secondary | ICD-10-CM

## 2017-05-01 DIAGNOSIS — I159 Secondary hypertension, unspecified: Secondary | ICD-10-CM

## 2017-05-01 DIAGNOSIS — I10 Essential (primary) hypertension: Secondary | ICD-10-CM | POA: Diagnosis present

## 2017-05-01 DIAGNOSIS — I160 Hypertensive urgency: Secondary | ICD-10-CM | POA: Diagnosis present

## 2017-05-01 DIAGNOSIS — D649 Anemia, unspecified: Secondary | ICD-10-CM | POA: Diagnosis present

## 2017-05-01 DIAGNOSIS — Z6841 Body Mass Index (BMI) 40.0 and over, adult: Secondary | ICD-10-CM

## 2017-05-01 HISTORY — DX: Essential (primary) hypertension: I10

## 2017-05-01 HISTORY — DX: Headache: R51

## 2017-05-01 HISTORY — DX: Headache, unspecified: R51.9

## 2017-05-01 LAB — URINALYSIS, ROUTINE W REFLEX MICROSCOPIC
BACTERIA UA: NONE SEEN
BILIRUBIN URINE: NEGATIVE
Glucose, UA: NEGATIVE mg/dL
KETONES UR: NEGATIVE mg/dL
LEUKOCYTES UA: NEGATIVE
Nitrite: NEGATIVE
PROTEIN: 30 mg/dL — AB
Specific Gravity, Urine: 1.015 (ref 1.005–1.030)
pH: 6 (ref 5.0–8.0)

## 2017-05-01 LAB — COMPREHENSIVE METABOLIC PANEL
ALT: 20 U/L (ref 14–54)
ANION GAP: 6 (ref 5–15)
AST: 22 U/L (ref 15–41)
Albumin: 3.2 g/dL — ABNORMAL LOW (ref 3.5–5.0)
Alkaline Phosphatase: 91 U/L (ref 38–126)
BILIRUBIN TOTAL: 0.4 mg/dL (ref 0.3–1.2)
BUN: 7 mg/dL (ref 6–20)
CHLORIDE: 105 mmol/L (ref 101–111)
CO2: 25 mmol/L (ref 22–32)
Calcium: 9 mg/dL (ref 8.9–10.3)
Creatinine, Ser: 0.7 mg/dL (ref 0.44–1.00)
GFR calc Af Amer: 59 mL/min — ABNORMAL LOW (ref >60)
GFR, EST NON AFRICAN AMERICAN: 51 mL/min — AB (ref >60)
Glucose, Bld: 169 mg/dL — ABNORMAL HIGH (ref 65–99)
POTASSIUM: 4 mmol/L (ref 3.5–5.1)
Sodium: 136 mmol/L (ref 135–145)
TOTAL PROTEIN: 8.1 g/dL (ref 6.5–8.1)

## 2017-05-01 LAB — I-STAT TROPONIN, ED: TROPONIN I, POC: 0 ng/mL (ref 0.00–0.08)

## 2017-05-01 LAB — I-STAT CHEM 8, ED
BUN: 6 mg/dL (ref 6–20)
CALCIUM ION: 1.13 mmol/L — AB (ref 1.15–1.40)
CREATININE: 0.6 mg/dL (ref 0.44–1.00)
Chloride: 104 mmol/L (ref 101–111)
GLUCOSE: 167 mg/dL — AB (ref 65–99)
HEMATOCRIT: 36 % (ref 36.0–46.0)
Hemoglobin: 12.2 g/dL (ref 12.0–15.0)
Potassium: 4.1 mmol/L (ref 3.5–5.1)
Sodium: 139 mmol/L (ref 135–145)
TCO2: 25 mmol/L (ref 0–100)

## 2017-05-01 LAB — PROTIME-INR
INR: 1.03
PROTHROMBIN TIME: 13.5 s (ref 11.4–15.2)

## 2017-05-01 LAB — CBC
HEMATOCRIT: 34.7 % — AB (ref 36.0–46.0)
Hemoglobin: 10.6 g/dL — ABNORMAL LOW (ref 12.0–15.0)
MCH: 22.7 pg — ABNORMAL LOW (ref 26.0–34.0)
MCHC: 30.5 g/dL (ref 30.0–36.0)
MCV: 74.3 fL — AB (ref 78.0–100.0)
PLATELETS: 347 10*3/uL (ref 150–400)
RBC: 4.67 MIL/uL (ref 3.87–5.11)
RDW: 16.8 % — AB (ref 11.5–15.5)
WBC: 7 10*3/uL (ref 4.0–10.5)

## 2017-05-01 LAB — DIFFERENTIAL
BASOS ABS: 0 10*3/uL (ref 0.0–0.1)
BASOS PCT: 0 %
EOS ABS: 0.1 10*3/uL (ref 0.0–0.7)
EOS PCT: 2 %
LYMPHS ABS: 3.3 10*3/uL (ref 0.7–4.0)
Lymphocytes Relative: 47 %
MONO ABS: 0.2 10*3/uL (ref 0.1–1.0)
Monocytes Relative: 3 %
Neutro Abs: 3.4 10*3/uL (ref 1.7–7.7)
Neutrophils Relative %: 48 %

## 2017-05-01 LAB — RAPID URINE DRUG SCREEN, HOSP PERFORMED
Amphetamines: NOT DETECTED
BENZODIAZEPINES: NOT DETECTED
Barbiturates: NOT DETECTED
COCAINE: NOT DETECTED
OPIATES: NOT DETECTED
Tetrahydrocannabinol: NOT DETECTED

## 2017-05-01 LAB — APTT: APTT: 23 s — AB (ref 24–36)

## 2017-05-01 LAB — ETHANOL: Alcohol, Ethyl (B): <5 mg/dL (ref <5)

## 2017-05-01 MED ORDER — ACETAMINOPHEN 325 MG PO TABS
650.0000 mg | ORAL_TABLET | Freq: Once | ORAL | Status: AC
  Administered 2017-05-01: 650 mg via ORAL
  Filled 2017-05-01: qty 2

## 2017-05-01 MED ORDER — KETOROLAC TROMETHAMINE 30 MG/ML IJ SOLN
30.0000 mg | Freq: Once | INTRAMUSCULAR | Status: DC
Start: 2017-05-01 — End: 2017-05-01
  Filled 2017-05-01: qty 1

## 2017-05-01 MED ORDER — PROCHLORPERAZINE EDISYLATE 5 MG/ML IJ SOLN
10.0000 mg | Freq: Once | INTRAMUSCULAR | Status: DC
  Filled 2017-05-01: qty 2

## 2017-05-01 MED ORDER — DIPHENHYDRAMINE HCL 50 MG/ML IJ SOLN
25.0000 mg | Freq: Once | INTRAMUSCULAR | Status: DC
  Filled 2017-05-01: qty 1

## 2017-05-01 NOTE — Consult Note (Signed)
Neurology Consultation Reason for Consult: Aphasia Referring Physician: Effie ShyWentz, E  CC: Difficulty speaking  History is obtained from: Patient  HPI: Autumn [name] is a 31 year old female with a history of "headaches that make me want to lie down in a dark room" who presents with acute onset headache, difficulty speaking, paresthesia. She states that the left side started tingling starting in her hand and spreading up her side. She subsequently began having difficulty speaking as well. She was brought into the emergency department by family member and a code stroke was called.  She was taken to CT which was normal, and while on CT her symptoms began to rapidly improve. She still had some difficulty speaking, but was able to name simple objects and provided history.  LKW: 6 PM tpa given?: no, rapidly improving symptoms    ROS:  Unable to obtain due to altered mental status.   Past medical history: Hypertension  Family history: Unable to obtain due to difficulty speaking  Social History:  has no tobacco, alcohol, and drug history on file.  Exam: Current vital signs: BP (!) 171/101   Pulse 93   Temp 99.8 F (37.7 C)   Resp 18   Wt (!) 156.4 kg (344 lb 12.8 oz)   SpO2 100%  Vital signs in last 24 hours: Temp:  [99.8 F (37.7 C)] 99.8 F (37.7 C) (08/07 1853) Pulse Rate:  [93] 93 (08/07 1853) Resp:  [18] 18 (08/07 1853) BP: (171)/(101) 171/101 (08/07 1853) SpO2:  [100 %] 100 % (08/07 1853) Weight:  [156.4 kg (344 lb 12.8 oz)] 156.4 kg (344 lb 12.8 oz) (08/07 1912)   Physical Exam  Constitutional: Appears well-developed and well-nourished.  Psych: Affect appropriate to situation Eyes: No scleral injection HENT: No OP obstrucion Head: Normocephalic.  Cardiovascular: Normal rate and regular rhythm.  Respiratory: Effort normal and breath sounds normal to anterior ascultation GI: Soft.  No distension. There is no tenderness.  Skin: WDI  Neuro: Mental Status: Patient  is awake, alert, gives the month and age correctly, she initially has some stuttering speech, but able to name objects and repeat without difficulty. Cranial Nerves: II: Visual Fields are full. Pupils are equal, round, and reactive to light.   III,IV, VI: EOMI without ptosis or diploplia.  V: Facial sensation is symmetric to temperature VII: Facial movement is symmetric.  VIII: hearing is intact to voice X: Uvula elevates symmetrically XI: Shoulder shrug is symmetric. XII: tongue is midline without atrophy or fasciculations.  Motor: Tone is normal. Bulk is normal. She has mild give way weakness of the left leg, otherwise full strength Sensory: Sensation is symmetric to light touch in the arms and legs. Cerebellar: FNF and HKS are intact bilaterally  I have reviewed labs in epic and the results pertinent to this consultation are: CMP-unremarkable  I have reviewed the images obtained: CT head-unremarkable  Impression: 31 year old female with signs and symptoms most consistent with complicated migraine given that there are positive  Symptoms (paresthesia), and associated headache. Though she denies a history of diagnosis of migraines, she does endorse a history of headaches associated with photophobia that are most consistent with migraines.  Recommendations: 1) MRI brain 2) if negative, I would treat as complicated migraine.   Ritta SlotMcNeill Ariez Neilan, MD [facility] [phone]  If 7pm- 7am, please page neurology on call as listed in AMION.

## 2017-05-01 NOTE — ED Provider Notes (Signed)
[facility]-EMERGENCY DEPT Provider Note   CSN: [id number] Arrival date & time: 05/01/17  7829   An emergency department physician performed an initial assessment on this suspected stroke patient at 1835.  History   Chief Complaint Chief Complaint  Patient presents with  . Code Stroke    HPI Autumn [name] is a 31 y.o. female.  She presents for evaluation of headache, which started today, while she was at home.  Later, she drove to get her sister from work, then her sister drove her here because she began to have trouble talking.  She has had this happen previously, when she had headaches.  On arrival to the emergency department she was felt to be code stroke, so was originally seen by neurology.  She complains of numbness left face, and left arm, which is a recurrent symptom happening as many as 3 times per week for several months.  She has previously seen neurology and been told she may have carpal tunnel syndrome.  She denies any significant stress in her life.  She has been told she had high blood pressure in the past but is not taking medications.  She has persistent chest pain, nausea or vomiting.  Her sister states that she has chest pain frequently, and also pain in arms and legs.  Patient states she is unable to work because of her ongoing symptoms.  There are no other known modifying factors.  HPI  Past Medical History:  Diagnosis Date  . Essential hypertension     Patient Active Problem List   Diagnosis Date Noted  . Complicated migraine 05/02/2017  . Hypertensive urgency 05/02/2017  . Normocytic anemia 05/02/2017  . Essential hypertension     No past surgical history on file.  OB History    No data available       Home Medications    Prior to Admission medications   Medication Sig Start Date End Date Taking? Authorizing Provider  acetaminophen (TYLENOL) 500 MG tablet Take 500 mg by mouth every 6 (six) hours as needed for mild pain or headache.   Yes [provider]  aspirin EC 325 MG tablet Take 325 mg by mouth daily.   Yes [provider]    Family History No family history on file.  Social History Social History  Substance Use Topics  . Smoking status: Not on file  . Smokeless tobacco: Not on file  . Alcohol use Not on file     Allergies   Patient has no known allergies.   Review of Systems Review of Systems  All other systems reviewed and are negative.    Physical Exam Updated Vital Signs BP (!) 170/81   Pulse 87   Temp 99.8 F (37.7 C)   Resp 19   Wt (!) 156.4 kg (344 lb 12.8 oz)   SpO2 100%   Physical Exam  Constitutional: She is oriented to person, place, and time. She appears well-developed. No distress.  Morbidly obese  HENT:  Head: Normocephalic and atraumatic.  Eyes: Pupils are equal, round, and reactive to light. Conjunctivae and EOM are normal.  Neck: Normal range of motion and phonation normal. Neck supple.  Cardiovascular: Normal rate and regular rhythm.   Pulmonary/Chest: Effort normal and breath sounds normal. She exhibits no tenderness.  Abdominal: Soft. She exhibits no distension. There is no tenderness. There is no guarding.  Musculoskeletal: Normal range of motion.  Neurological: She is alert and oriented to person, place, and time. She exhibits normal  muscle tone.  Mild nonrhythmic tremor.  No dysarthria aphasia or nystagmus.  No pronator drift.  Skin: Skin is warm and dry.  Psychiatric: She has a normal mood and affect. Her behavior is normal. Judgment and thought content normal.  Nursing note and vitals reviewed.    ED Treatments / Results  Labs (all labs ordered are listed, but only abnormal results are displayed) Labs Reviewed  APTT - Abnormal; Notable for the following:       Result Value   aPTT 23 (*)    All other components within normal limits  CBC - Abnormal; Notable for the following:    Hemoglobin 10.6 (*)    HCT 34.7 (*)    MCV 74.3 (*)    MCH 22.7 (*)      RDW 16.8 (*)    All other components within normal limits  COMPREHENSIVE METABOLIC PANEL - Abnormal; Notable for the following:    Glucose, Bld 169 (*)    Albumin 3.2 (*)    GFR calc non Af Amer 51 (*)    GFR calc Af Amer 59 (*)    All other components within normal limits  URINALYSIS, ROUTINE W REFLEX MICROSCOPIC - Abnormal; Notable for the following:    APPearance HAZY (*)    Hgb urine dipstick LARGE (*)    Protein, ur 30 (*)    Squamous Epithelial / LPF 6-30 (*)    All other components within normal limits  I-STAT CHEM 8, ED - Abnormal; Notable for the following:    Glucose, Bld 167 (*)    Calcium, Ion 1.13 (*)    All other components within normal limits  ETHANOL  PROTIME-INR  DIFFERENTIAL  RAPID URINE DRUG SCREEN, HOSP PERFORMED  I-STAT TROPONIN, ED    EKG  EKG Interpretation None       Radiology Mr Brain Wo Contrast  Result Date: 05/01/2017 CLINICAL DATA:  Sudden onset left-sided arm and leg weakness with tingling, expressive aphasia, and facial weakness. EXAM: MRI HEAD WITHOUT CONTRAST TECHNIQUE: Axial DWI sequence. COMPARISON:  05/01/2017 CT head. FINDINGS: Only axial DWI sequence was acquired, patient claustrophobic. No focus of reduced diffusion to suggest acute or early subacute infarct. Normal brain parenchymal volume and ventricle size. IMPRESSION: No evidence of acute or early subacute infarction. Electronically Signed   By: Mitzi Hansen M.D.   On: 05/01/2017 19:48   Ct Head Code Stroke Wo Contrast  Result Date: 05/01/2017 CLINICAL DATA:  Code stroke.  Left-sided weakness EXAM: CT HEAD WITHOUT CONTRAST TECHNIQUE: Contiguous axial images were obtained from the base of the skull through the vertex without intravenous contrast. COMPARISON:  None. FINDINGS: Brain: No mass lesion or acute hemorrhage. No focal hypoattenuation of the basal ganglia or cortex to indicate infarcted tissue. No hydrocephalus or age advanced atrophy. Incidentally noted partially  empty sella. Vascular: No hyperdense vessel. No advanced atherosclerotic calcification of the arteries at the skull base. Skull: Normal visualized skull base, calvarium and extracranial soft tissues. Sinuses/Orbits: No sinus fluid levels or advanced mucosal thickening. No mastoid effusion. Normal orbits. ASPECTS [facility] Stroke Program Early CT Score) - Ganglionic level infarction (caudate, lentiform nuclei, internal capsule, insula, M1-M3 cortex): 7 - Supraganglionic infarction (M4-M6 cortex): 3 Total score (0-10 with 10 being normal): 10 IMPRESSION: 1. No acute hemorrhage or mass lesion. 2. Normal head CT. 3. ASPECTS is 10. These results were called by telephone at the time of interpretation on 05/01/2017 at 7:03 pm to Dr. Ritta Slot, who verbally acknowledged these results. Electronically  Signed   By: Deatra RobinsonKevin  Herman M.D.   On: 05/01/2017 19:09    Procedures Procedures (including critical care time)  Medications Ordered in ED Medications  acetaminophen (TYLENOL) tablet 650 mg (650 mg Oral Given 05/01/17 2258)     Initial Impression / Assessment and Plan / ED Course  I have reviewed the triage vital signs and the nursing notes.  Pertinent labs & imaging results that were available during my care of the patient were reviewed by me and considered in my medical decision making (see chart for details).  Clinical Course as of May 02 13  Tue May 01, 2017  2347 The patient has a double record, her prior medical record number is [id number].  This record is reviewed.  Patient has a history of spells, which were evaluated for seizures, by neurology with a negative EEG.  She also had EMG done for muscle pain, and was found to have carpal tunnel syndrome, left side.  The last visit with neurology was May 2017.  [EW]    Clinical Course User Index [EW] Mancel BaleWentz, Swayze Kozuch, MD     Patient Vitals for the past 24 hrs:  BP Temp Pulse Resp SpO2 Weight  05/01/17 2345 (!) 170/81 - 87 19 100 % -  05/01/17  2300 (!) 173/94 - 91 20 100 % -  05/01/17 2230 (!) 172/90 - 83 18 100 % -  05/01/17 2145 (!) 190/93 - 89 17 99 % -  05/01/17 2128 - 99.8 F (37.7 C) - - - -  05/01/17 2115 (!) 197/98 - 93 19 100 % -  05/01/17 2000 (!) 201/99 - 93 19 100 % -  05/01/17 1912 - - - - - (!) 156.4 kg (344 lb 12.8 oz)  05/01/17 1853 (!) 171/101 99.8 F (37.7 C) 93 18 100 % -    11:26 PM Reevaluation with update and discussion. After initial assessment and treatment, an updated evaluation reveals she continues to be tremulous, and states that her headache is somewhat better.  Patient's family members are concerned about her blood pressure, and shakiness.  We discussed potential avenues for follow-up including outpatient evaluation by neurology.  Patient does not have an ongoing relationship with oncology.  Patient family members would like to have the patient admitted for observation and further evaluation, in lieu of the patient being discharged without a definitive diagnosis.Mancel Bale. Timarion Agcaoili L    Final Clinical Impressions(s) / ED Diagnoses   Final diagnoses:  Nonintractable headache, unspecified chronicity pattern, unspecified headache type  Secondary hypertension  Transient alteration of awareness  Essential hypertension   Recurrent aphasia with altered mental status, and headache.  Nonspecific ongoing chest and extremity pain.  Mild hypertension without significant signs of endorgan damage.  Doubt PRES. Doubt metabolic instability or impending vascular collapse.  Nursing Notes Reviewed/ Care Coordinated Applicable Imaging Reviewed Interpretation of Laboratory Data incorporated into ED treatment   Plan: Admit  New Prescriptions New Prescriptions   No medications on file     Mancel BaleWentz, Tennyson Kallen, MD 05/02/17 [id number]

## 2017-05-01 NOTE — ED Triage Notes (Signed)
Pt presents to the ed by pov, with family. Complaints of sudden onset at 1800 of left sided arm and leg weakness and tingling, expressive aphasia and facial weakness. Taken straight to ct, code stroke called

## 2017-05-01 NOTE — Code Documentation (Signed)
This afternoon after work she describes that she had a sever HA and tingling that spread up her right arm.  She said she check her BP and it was "high".  She arrived via private vehicle at [id number].  Stat labs and head CT done.  Upon arrival she had difficulty speaking and numbness and tingling.  Her symptoms rapidly improved while in the CT scanner. NIHSS 1 for right arm and leg numbness.   MRI done.  Hand off with ED RN.

## 2017-05-02 ENCOUNTER — Encounter (HOSPITAL_COMMUNITY): Payer: Self-pay | Admitting: Internal Medicine

## 2017-05-02 ENCOUNTER — Observation Stay (HOSPITAL_COMMUNITY): Payer: Medicaid Other

## 2017-05-02 ENCOUNTER — Other Ambulatory Visit: Payer: Self-pay

## 2017-05-02 ENCOUNTER — Telehealth: Payer: Self-pay | Admitting: Neurology

## 2017-05-02 DIAGNOSIS — I10 Essential (primary) hypertension: Secondary | ICD-10-CM | POA: Diagnosis present

## 2017-05-02 DIAGNOSIS — G43109 Migraine with aura, not intractable, without status migrainosus: Secondary | ICD-10-CM | POA: Diagnosis present

## 2017-05-02 DIAGNOSIS — Z7982 Long term (current) use of aspirin: Secondary | ICD-10-CM | POA: Diagnosis not present

## 2017-05-02 DIAGNOSIS — E1165 Type 2 diabetes mellitus with hyperglycemia: Secondary | ICD-10-CM | POA: Diagnosis present

## 2017-05-02 DIAGNOSIS — I160 Hypertensive urgency: Secondary | ICD-10-CM | POA: Diagnosis present

## 2017-05-02 DIAGNOSIS — D649 Anemia, unspecified: Secondary | ICD-10-CM | POA: Diagnosis present

## 2017-05-02 DIAGNOSIS — D509 Iron deficiency anemia, unspecified: Secondary | ICD-10-CM | POA: Diagnosis present

## 2017-05-02 DIAGNOSIS — R29700 NIHSS score 0: Secondary | ICD-10-CM | POA: Diagnosis present

## 2017-05-02 DIAGNOSIS — Z6841 Body Mass Index (BMI) 40.0 and over, adult: Secondary | ICD-10-CM | POA: Diagnosis not present

## 2017-05-02 DIAGNOSIS — R404 Transient alteration of awareness: Secondary | ICD-10-CM | POA: Diagnosis present

## 2017-05-02 DIAGNOSIS — R531 Weakness: Secondary | ICD-10-CM

## 2017-05-02 LAB — HEMOGLOBIN A1C
HEMOGLOBIN A1C: 6.5 % — AB (ref 4.8–5.6)
MEAN PLASMA GLUCOSE: 139.85 mg/dL

## 2017-05-02 LAB — HIV ANTIBODY (ROUTINE TESTING W REFLEX): HIV Screen 4th Generation wRfx: NONREACTIVE

## 2017-05-02 LAB — HCG, QUANTITATIVE, PREGNANCY

## 2017-05-02 MED ORDER — ACETAMINOPHEN 500 MG PO TABS
500.0000 mg | ORAL_TABLET | Freq: Four times a day (QID) | ORAL | Status: DC | PRN
  Administered 2017-05-02: 500 mg via ORAL
  Filled 2017-05-02: qty 1

## 2017-05-02 MED ORDER — DEXTROSE 5 % IV SOLN: 500.0000 mg | Freq: Four times a day (QID) | INTRAVENOUS | Status: DC

## 2017-05-02 MED ORDER — KETOROLAC TROMETHAMINE 30 MG/ML IJ SOLN
30.0000 mg | Freq: Four times a day (QID) | INTRAMUSCULAR | Status: AC
  Administered 2017-05-02: 30 mg via INTRAVENOUS
  Filled 2017-05-02: qty 1

## 2017-05-02 MED ORDER — SENNOSIDES-DOCUSATE SODIUM 8.6-50 MG PO TABS
1.0000 | ORAL_TABLET | Freq: Every evening | ORAL | Status: DC | PRN
  Filled 2017-05-02: qty 1

## 2017-05-02 MED ORDER — PROCHLORPERAZINE EDISYLATE 5 MG/ML IJ SOLN
10.0000 mg | Freq: Four times a day (QID) | INTRAMUSCULAR | Status: AC
  Administered 2017-05-02: 10 mg via INTRAVENOUS
  Filled 2017-05-02: qty 2

## 2017-05-02 MED ORDER — DIPHENHYDRAMINE HCL 50 MG/ML IJ SOLN
25.0000 mg | Freq: Four times a day (QID) | INTRAMUSCULAR | Status: AC
  Administered 2017-05-02: 25 mg via INTRAVENOUS
  Filled 2017-05-02: qty 1

## 2017-05-02 MED ORDER — ONDANSETRON HCL 4 MG/2ML IJ SOLN: 4.0000 mg | Freq: Three times a day (TID) | INTRAMUSCULAR | Status: DC | PRN

## 2017-05-02 MED ORDER — SODIUM CHLORIDE 0.9 % IV SOLN
INTRAVENOUS | Status: DC
  Administered 2017-05-02: 02:00:00 via INTRAVENOUS

## 2017-05-02 MED ORDER — STROKE: EARLY STAGES OF RECOVERY BOOK
Freq: Once | Status: DC
  Filled 2017-05-02: qty 1

## 2017-05-02 MED ORDER — AMLODIPINE BESYLATE 5 MG PO TABS
ORAL_TABLET | ORAL | Status: AC
  Filled 2017-05-02: qty 1

## 2017-05-02 MED ORDER — SUMATRIPTAN SUCCINATE 50 MG PO TABS
50.0000 mg | ORAL_TABLET | ORAL | Status: DC | PRN
  Administered 2017-05-02 – 2017-05-03 (×2): 50 mg via ORAL
  Filled 2017-05-02 (×4): qty 1

## 2017-05-02 MED ORDER — ENOXAPARIN SODIUM 80 MG/0.8ML ~~LOC~~ SOLN
80.0000 mg | Freq: Every day | SUBCUTANEOUS | Status: DC
  Administered 2017-05-02: 80 mg via SUBCUTANEOUS
  Filled 2017-05-02: qty 0.8

## 2017-05-02 MED ORDER — AMLODIPINE BESYLATE 5 MG PO TABS
5.0000 mg | ORAL_TABLET | Freq: Every day | ORAL | Status: DC
  Administered 2017-05-02 – 2017-05-03 (×2): 5 mg via ORAL
  Filled 2017-05-02 (×2): qty 1

## 2017-05-02 MED ORDER — ASPIRIN EC 325 MG PO TBEC
325.0000 mg | DELAYED_RELEASE_TABLET | Freq: Every day | ORAL | Status: DC
  Administered 2017-05-02 – 2017-05-03 (×2): 325 mg via ORAL
  Filled 2017-05-02 (×2): qty 1

## 2017-05-02 MED ORDER — ZOLPIDEM TARTRATE 5 MG PO TABS
5.0000 mg | ORAL_TABLET | Freq: Every evening | ORAL | Status: DC | PRN
  Administered 2017-05-03: 5 mg via ORAL
  Filled 2017-05-02: qty 1

## 2017-05-02 MED ORDER — SODIUM CHLORIDE 0.9 % IV SOLN
INTRAVENOUS | Status: AC
  Administered 2017-05-02: 18:00:00 via INTRAVENOUS

## 2017-05-02 MED ORDER — HYDRALAZINE HCL 20 MG/ML IJ SOLN: 5.0000 mg | INTRAMUSCULAR | Status: DC | PRN

## 2017-05-02 MED ORDER — ENOXAPARIN SODIUM 80 MG/0.8ML ~~LOC~~ SOLN
80.0000 mg | Freq: Every day | SUBCUTANEOUS | Status: DC
  Filled 2017-05-02: qty 0.8

## 2017-05-02 NOTE — H&P (Signed)
History and Physical    Autumn [name]:[id number] DOB: 1985/09/27 DOA: 05/01/2017  Referring MD/NP/PA:   PCP: Patient, No Pcp Per   Patient coming from:  The patient is coming from home.  At baseline, pt is independent for most of ADL.  Chief Complaint: Left-sided weakness, tingling, headache, difficulty speaking, blurry vision  HPI: Autumn [name] is a 31 y.o. female with medical history significant of hypertension, headache, morbid obesity, who presents with left-sided weakness, tingling, headache, difficulty speaking and blurry vision.  Patient states that she has history of headache, but not diagnosed as migraine headache. At about 6:00 PM, she started having left-sided weakness, tingling, headache, difficulty speaking, blurry vision. Her headaches is a located in the frontal area, constant, 10 out of 10 in severity, nonradiating. No fever or chills. Patient denies chest pain, SOB, cough,. No nausea, vomiting, diarrhea, abdominal pain or symptoms of UTI. Her symptoms has improved in ED, but still has difficulty speaking and left sided weakness.  ED Course: pt was found to have WBC 7.0, hemoglobin 10.6, INR 1.03, negative UDS, negative urinalysis, alcohol level less than 5, electrolytes renal function okay, temperature 99.8, blood pressure 201/99, oxygen saturation 99% on room air, CT head and MRI of her brain are negative for acute abnormalities. Patient is placed on telemetry bed for observation. Neurology, Dr. Amada Jupiter was consulted.  Review of Systems:   General: no fevers, chills, no changes in body weight, has fatigue and HA HEENT: no blurry vision, hearing changes or sore throat Respiratory: no dyspnea, coughing, wheezing CV: no chest pain, no palpitations GI: no nausea, vomiting, abdominal pain, diarrhea, constipation GU: no dysuria, burning on urination, increased urinary frequency, hematuria  Ext: no leg edema Neuro: Left-sided weakness, tingling, headache,  difficulty speaking, blurry vision Skin: no rash, no skin tear. MSK: No muscle spasm, no deformity, no limitation of range of movement in spin Heme: No easy bruising.  Travel history: No recent long distant travel.  Allergy: No Known Allergies  Past Medical History:  Diagnosis Date  . Essential hypertension   . Headache     Past Surgical History:  Procedure Laterality Date  . CESAREAN SECTION      Social History:  reports that she has never smoked. She does not have any smokeless tobacco history on file. She reports that she does not drink alcohol or use drugs.  Family History:  Family History  Problem Relation Age of Onset  . Hypertension Mother   . Diabetes Mellitus II Mother   . CAD Father   . Hyperlipidemia Father   . Hypertension Sister   . Diabetes Mellitus II Sister      Prior to Admission medications   Medication Sig Start Date End Date Taking? Authorizing Provider  acetaminophen (TYLENOL) 500 MG tablet Take 500 mg by mouth every 6 (six) hours as needed for mild pain or headache.   Yes [provider]  aspirin EC 325 MG tablet Take 325 mg by mouth daily.   Yes [provider]    Physical Exam: Vitals:   05/02/17 0130 05/02/17 0145 05/02/17 0200 05/02/17 0215  BP: 138/75 (!) 143/84 (!) 162/96 (!) 151/83  Pulse: 83 87 92 83  Resp: 15 (!) 27 (!) 26 (!) 24  Temp:      SpO2: 96% 100% 97% 95%  Weight:       General: Not in acute distress HEENT:       Eyes: PERRL, EOMI, no scleral icterus.  ENT: No discharge from the ears and nose, no pharynx injection, no tonsillar enlargement.        Neck: No JVD, no bruit, no mass felt. Heme: No neck lymph node enlargement. Cardiac: S1/S2, RRR, No murmurs, No gallops or rubs. Respiratory: No rales, wheezing, rhonchi or rubs. GI: Soft, nondistended, nontender, no rebound pain, no organomegaly, BS present. GU: No hematuria Ext: No pitting leg edema bilaterally. 2+DP/PT pulse  bilaterally. Musculoskeletal: No joint deformities, No joint redness or warmth, no limitation of ROM in spin. Skin: No rashes.  Neuro: Alert, oriented X3, cranial nerves II-XII grossly intact. Muscle strength 4/5 in left arm and leg, 5/5 in right extremities, sensation to light touch intact. Brachial reflex 2+ bilaterally. Negative Babinski's sign.  Psych: Patient is not psychotic, no suicidal or hemocidal ideation.  Labs on Admission: I have personally reviewed following labs and imaging studies  CBC:  Recent Labs Lab 05/01/17 1858  WBC 7.0  NEUTROABS 3.4  HGB 10.6*  12.2  HCT 34.7*  36.0  MCV 74.3*  PLT 347   Basic Metabolic Panel:  Recent Labs Lab 05/01/17 1858  NA 136  139  K 4.0  4.1  CL 105  104  CO2 25  GLUCOSE 169*  167*  BUN 7  6  CREATININE 0.70  0.60  CALCIUM 9.0   GFR: CrCl cannot be calculated (Unknown ideal weight.). Liver Function Tests:  Recent Labs Lab 05/01/17 1858  AST 22  ALT 20  ALKPHOS 91  BILITOT 0.4  PROT 8.1  ALBUMIN 3.2*   No results for input(s): LIPASE, AMYLASE in the last 168 hours. No results for input(s): AMMONIA in the last 168 hours. Coagulation Profile:  Recent Labs Lab 05/01/17 1858  INR 1.03   Cardiac Enzymes: No results for input(s): CKTOTAL, CKMB, CKMBINDEX, TROPONINI in the last 168 hours. BNP (last 3 results) No results for input(s): PROBNP in the last 8760 hours. HbA1C: No results for input(s): HGBA1C in the last 72 hours. CBG: No results for input(s): GLUCAP in the last 168 hours. Lipid Profile: No results for input(s): CHOL, HDL, LDLCALC, TRIG, CHOLHDL, LDLDIRECT in the last 72 hours. Thyroid Function Tests: No results for input(s): TSH, T4TOTAL, FREET4, T3FREE, THYROIDAB in the last 72 hours. Anemia Panel: No results for input(s): VITAMINB12, FOLATE, FERRITIN, TIBC, IRON, RETICCTPCT in the last 72 hours. Urine analysis:    Component Value Date/Time   COLORURINE YELLOW 05/01/2017 2105    APPEARANCEUR HAZY (A) 05/01/2017 2105   LABSPEC 1.015 05/01/2017 2105   PHURINE 6.0 05/01/2017 2105   GLUCOSEU NEGATIVE 05/01/2017 2105   HGBUR LARGE (A) 05/01/2017 2105   BILIRUBINUR NEGATIVE 05/01/2017 2105   KETONESUR NEGATIVE 05/01/2017 2105   PROTEINUR 30 (A) 05/01/2017 2105   NITRITE NEGATIVE 05/01/2017 2105   LEUKOCYTESUR NEGATIVE 05/01/2017 2105   Sepsis Labs: @LABRCNTIP (procalcitonin:4,lacticidven:4) )No results found for this or any previous visit (from the past 240 hour(s)).   Radiological Exams on Admission: Mr Brain Wo Contrast  Result Date: 05/01/2017 CLINICAL DATA:  Sudden onset left-sided arm and leg weakness with tingling, expressive aphasia, and facial weakness. EXAM: MRI HEAD WITHOUT CONTRAST TECHNIQUE: Axial DWI sequence. COMPARISON:  05/01/2017 CT head. FINDINGS: Only axial DWI sequence was acquired, patient claustrophobic. No focus of reduced diffusion to suggest acute or early subacute infarct. Normal brain parenchymal volume and ventricle size. IMPRESSION: No evidence of acute or early subacute infarction. Electronically Signed   By: Mitzi HansenLance  Furusawa-Stratton M.D.   On: 05/01/2017 19:48   Ct Head  Code Stroke Wo Contrast  Result Date: 05/01/2017 CLINICAL DATA:  Code stroke.  Left-sided weakness EXAM: CT HEAD WITHOUT CONTRAST TECHNIQUE: Contiguous axial images were obtained from the base of the skull through the vertex without intravenous contrast. COMPARISON:  None. FINDINGS: Brain: No mass lesion or acute hemorrhage. No focal hypoattenuation of the basal ganglia or cortex to indicate infarcted tissue. No hydrocephalus or age advanced atrophy. Incidentally noted partially empty sella. Vascular: No hyperdense vessel. No advanced atherosclerotic calcification of the arteries at the skull base. Skull: Normal visualized skull base, calvarium and extracranial soft tissues. Sinuses/Orbits: No sinus fluid levels or advanced mucosal thickening. No mastoid effusion. Normal orbits.  ASPECTS [facility] Stroke Program Early CT Score) - Ganglionic level infarction (caudate, lentiform nuclei, internal capsule, insula, M1-M3 cortex): 7 - Supraganglionic infarction (M4-M6 cortex): 3 Total score (0-10 with 10 being normal): 10 IMPRESSION: 1. No acute hemorrhage or mass lesion. 2. Normal head CT. 3. ASPECTS is 10. These results were called by telephone at the time of interpretation on 05/01/2017 at 7:03 pm to Dr. Ritta Slot, who verbally acknowledged these results. Electronically Signed   By: Deatra Robinson M.D.   On: 05/01/2017 19:09     EKG: Independently reviewed.  Sinus rhythm, QTC 449, T-wave inversion in inferior leads mildly, early R-wave progression  Assessment/Plan Principal Problem:   Complicated migraine Active Problems:   Hypertensive urgency   Essential hypertension   Normocytic anemia   Left-sided weakness  Complicated migraine: Patient's symptoms were initially concerning for TIA versus stroke. CT head and MRI of the brain are negative for acute intracranial abnormalities. Neurology, Dr. Amada Jupiter was consulted, recommended to treat patient as complicated migraine. -will place on tele bed for obs -swallowing screen -prn sumatriptan -When necessary Zofran for nausea  Hypertensive urgency: Blood pressure 201/99-1 70/81. Patient has a history of a mild hypertension, currently is not taking medications. -Start amlodipine 5 mg daily -IVF hydralazine when necessary -check UDS  Normocytic anemia: Hemoglobin 10.6, no baseline hemoglobin available. -Anemia panel -Follow-upCBC   DVT ppx:  SQ Lovenox Code Status: Full code Family Communication:  Yes, patient's mother, husband, sister at bed side Disposition Plan:  Anticipate discharge back to previous home environment Consults called:  neurology, Dr. Amada Jupiter Admission status: Obs / tele    Date of Service 05/02/2017    Lorretta Harp [facility] Pager [phone]  If 7PM-7AM, please contact  night-coverage [email or web address] Password [email or web address] 05/02/2017, 6:04 AM

## 2017-05-02 NOTE — Progress Notes (Signed)
Subjective: patient in room with lights on in no distress complaining of 9/10 HA. She states she has been here multiple times and very upset no one has figured out what is wrong. Family in the room has multiple complaints. The patient has a tremor in her right arm however this is easily ceased by touching her arm.    Per Dr. Ena Dawley note in past:   IMPRESSION: 1.  Spells of generalized numbness, dizziness, and speech arrest.  Duration of spells are longer than expected for seizure disorder, such as absence seizures, but since they are stereotyped, it is prudent to evaluation for seizures.  She will have routine EEG performed, if she does not have a spell during this testing, she will need ambulatory EEG.  MRI brain will be ordered if there is abnormal epileptiform activity on her EEG. With her normal exam, I do not feel that it is warranted at this time.   2.  Generalized myalgias and paresthesias.  With a normal neurological exam, my suspicion for neuropathy or myopathy is low.  NCS/EMG of the left arm and leg will be performed to better characterize the nature of her symptoms.  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  Lab testing for TSH, ANA, vitamin B12, and ESR will be ordered.    Return to clinic in 3 months.  Per patient she was never told to follow up.   Exam: Vitals:   05/02/17 0700 05/02/17 0730  BP:    Pulse: 81 79  Resp:  18  Temp:      HEENT-  Normocephalic, no lesions, without obvious abnormality.  Normal external eye and conjunctiva.  Normal TM's bilaterally.  Normal auditory canals and external ears. Normal external nose, mucus membranes and septum.  Normal pharynx.    Neuro:  CN: Pupils are equal and round. They are symmetrically reactive from 3-->2 mm. EOMI without nystagmus. Facial sensation is intact to light touch. Face is symmetric at rest with normal strength and mobility. Hearing is intact to conversational voice. Palate elevates symmetrically  and uvula is midline. Voice is normal in tone, pitch and quality. Bilateral SCM and trapezii are 5/5. Tongue is midline with normal bulk and mobility.  Motor: Normal bulk, tone, and strength. 5/5 throughout. No drift.  Sensation: Intact to light touch.  DTRs: 2+, symmetric  Toes downgoing bilaterally. No pathologic reflexes.  Coordination: Finger-to-nose and heel-to-shin are without dysmetria     Pertinent Labs/Diagnostics: MRI brain normal Previous EEG normal Previous EMG/NCV: Impression: 1. Left median neuropathy at or distal to the wrist, consistent with the clinical diagnosis of carpal tunnel syndrome. Overall, these findings are moderate in degree electrically. 2. There is no evidence of a diffuse myopathy, cervical/lumbosacral radiculopathy, or sensorimotor polyneuropathy.  Autumn Quill PA-C [facility] [phone]  Impression:  31 year old female with recurrent episodes of speech difficulty, paresthesia, sometimes associated with headache. Description of the events is much more consistent with, acute migraine then other etiologies, though an EEG would be prudent.  Recommendations: 1) Will give patient 10 mg of Compazine, 30 mg Toradol, 25 mg Benadryl to abate her headache, repeat 6 hours following initial dose 2) EEG  Roland Rack, MD [facility] [phone]  If 7pm- 7am, please page neurology on call as listed in [place]. 05/02/2017, 9:03 AM

## 2017-05-02 NOTE — Telephone Encounter (Signed)
PT's sister Inetta Fermoina called and said PT was in the hospital and she wants to know if [facility] CB# [phone]

## 2017-05-02 NOTE — Progress Notes (Signed)
PROGRESS NOTE    Autumn [name]  ZOX:[id number] DOB: 1986-01-16 DOA: 05/01/2017 PCP: Patient, No Pcp Per  Brief Narrative:Autumn [name] is a 31 y.o. female with medical history significant of hypertension, headache, morbid obesity, who presented with left-sided weakness, tingling, headache, difficulty speaking and blurry vision. At about 6:00 PM, she started having left-sided weakness, tingling, headache, difficulty speaking, blurry vision. Her headaches is a located in the frontal area, constant, 10 out of 10 in severity, nonradiating. Her symptoms improved in ED, but still has difficulty speaking and left sided weakness. In ED, labs unremarkable, CT head and MRI of her brain are negative for acute abnormalities   Assessment & Plan:     Complicated migraine -suspected, CT and MRI unremarkable -Neuro consulting -EEG ordered-pending -still with mild residual L side weakness, will ask PT/OT to ambulate -Imitrex ordered and cocktail of phenergan/toraldol/benadryl too    Hypertensive urgency -due to above stress likely, improving -added amlodipine -hydralazine PRN    Hyperglycemia/?New DM -check Hba1c    Anemia -check anemia panel   Obesity -life style modification  DVT prophylaxis:lovenox Code Status: Full Code Family Communication: None at bedside Disposition Plan: Home when improved, ? tomorrow  Consultants:  NEuro  Subjective: Still with HA, L side weakness better but not resolved  Objective: Vitals:   05/02/17 1200 05/02/17 1230 05/02/17 1300 05/02/17 1315  BP: (!) 150/98 (!) 155/87 (!) 155/89 (!) 152/93  Pulse: 86 (!) 101 94 93  Resp: 16 (!) 33 (!) 28 (!) 26  Temp:      SpO2: 100% 98% 97% 99%  Weight:        Intake/Output Summary (Last 24 hours) at 05/02/17 1407 Last data filed at 05/02/17 1037  Gross per 24 hour  Intake                0 ml  Output              920 ml  Net             -920 ml   Filed Weights   05/01/17 1912  Weight: (!) 156.4 kg  (344 lb 12.8 oz)    Examination:  General exam: uncomfortable, ill appearing, obese female Respiratory system: CTAB. Cardiovascular system: S1 & S2 heard, RRR. No JVD, murmurs, rubs Gastrointestinal system: Abdomen is nondistended, soft and nontender.Normal bowel sounds heard. Central nervous system: Alert and oriented. Mild LUE and LLE numbness and weakness 4/5, rest non focal Extremities: no edema Skin: No rashes, lesions or ulcers Psychiatry: flat affect    Data Reviewed:   CBC:  Recent Labs Lab 05/01/17 1858  WBC 7.0  NEUTROABS 3.4  HGB 10.6*  12.2  HCT 34.7*  36.0  MCV 74.3*  PLT 347   Basic Metabolic Panel:  Recent Labs Lab 05/01/17 1858  NA 136  139  K 4.0  4.1  CL 105  104  CO2 25  GLUCOSE 169*  167*  BUN 7  6  CREATININE 0.70  0.60  CALCIUM 9.0   GFR: CrCl cannot be calculated (Unknown ideal weight.). Liver Function Tests:  Recent Labs Lab 05/01/17 1858  AST 22  ALT 20  ALKPHOS 91  BILITOT 0.4  PROT 8.1  ALBUMIN 3.2*   No results for input(s): LIPASE, AMYLASE in the last 168 hours. No results for input(s): AMMONIA in the last 168 hours. Coagulation Profile:  Recent Labs Lab 05/01/17 1858  INR 1.03   Cardiac Enzymes: No results for input(s): CKTOTAL, CKMB,  CKMBINDEX, TROPONINI in the last 168 hours. BNP (last 3 results) No results for input(s): PROBNP in the last 8760 hours. HbA1C:  Recent Labs  05/02/17 0725  HGBA1C 6.5*   CBG: No results for input(s): GLUCAP in the last 168 hours. Lipid Profile: No results for input(s): CHOL, HDL, LDLCALC, TRIG, CHOLHDL, LDLDIRECT in the last 72 hours. Thyroid Function Tests: No results for input(s): TSH, T4TOTAL, FREET4, T3FREE, THYROIDAB in the last 72 hours. Anemia Panel: No results for input(s): VITAMINB12, FOLATE, FERRITIN, TIBC, IRON, RETICCTPCT in the last 72 hours. Urine analysis:    Component Value Date/Time   COLORURINE YELLOW 05/01/2017 2105   APPEARANCEUR HAZY (A)  05/01/2017 2105   LABSPEC 1.015 05/01/2017 2105   PHURINE 6.0 05/01/2017 2105   GLUCOSEU NEGATIVE 05/01/2017 2105   HGBUR LARGE (A) 05/01/2017 2105   BILIRUBINUR NEGATIVE 05/01/2017 2105   KETONESUR NEGATIVE 05/01/2017 2105   PROTEINUR 30 (A) 05/01/2017 2105   NITRITE NEGATIVE 05/01/2017 2105   LEUKOCYTESUR NEGATIVE 05/01/2017 2105   Sepsis Labs: @LABRCNTIP (procalcitonin:4,lacticidven:4)  )No results found for this or any previous visit (from the past 240 hour(s)).       Radiology Studies: Mr Brain 50 Contrast  Result Date: 05/01/2017 CLINICAL DATA:  Sudden onset left-sided arm and leg weakness with tingling, expressive aphasia, and facial weakness. EXAM: MRI HEAD WITHOUT CONTRAST TECHNIQUE: Axial DWI sequence. COMPARISON:  05/01/2017 CT head. FINDINGS: Only axial DWI sequence was acquired, patient claustrophobic. No focus of reduced diffusion to suggest acute or early subacute infarct. Normal brain parenchymal volume and ventricle size. IMPRESSION: No evidence of acute or early subacute infarction. Electronically Signed   By: Mitzi Hansen M.D.   On: 05/01/2017 19:48   Ct Head Code Stroke Wo Contrast  Result Date: 05/01/2017 CLINICAL DATA:  Code stroke.  Left-sided weakness EXAM: CT HEAD WITHOUT CONTRAST TECHNIQUE: Contiguous axial images were obtained from the base of the skull through the vertex without intravenous contrast. COMPARISON:  None. FINDINGS: Brain: No mass lesion or acute hemorrhage. No focal hypoattenuation of the basal ganglia or cortex to indicate infarcted tissue. No hydrocephalus or age advanced atrophy. Incidentally noted partially empty sella. Vascular: No hyperdense vessel. No advanced atherosclerotic calcification of the arteries at the skull base. Skull: Normal visualized skull base, calvarium and extracranial soft tissues. Sinuses/Orbits: No sinus fluid levels or advanced mucosal thickening. No mastoid effusion. Normal orbits. ASPECTS [facility] Stroke  Program Early CT Score) - Ganglionic level infarction (caudate, lentiform nuclei, internal capsule, insula, M1-M3 cortex): 7 - Supraganglionic infarction (M4-M6 cortex): 3 Total score (0-10 with 10 being normal): 10 IMPRESSION: 1. No acute hemorrhage or mass lesion. 2. Normal head CT. 3. ASPECTS is 10. These results were called by telephone at the time of interpretation on 05/01/2017 at 7:03 pm to Dr. Ritta Slot, who verbally acknowledged these results. Electronically Signed   By: Deatra Robinson M.D.   On: 05/01/2017 19:09        Scheduled Meds: .  stroke: mapping our early stages of recovery book   Does not apply Once  . amLODipine      . amLODipine  5 mg Oral Daily  . aspirin EC  325 mg Oral Daily  . diphenhydrAMINE  25 mg Intravenous Q6H   And  . ketorolac  30 mg Intravenous Q6H   And  . prochlorperazine  10 mg Intravenous Q6H  . enoxaparin (LOVENOX) injection  80 mg Subcutaneous q1800   Continuous Infusions: . sodium chloride  LOS: 0 days    Time spent: 35min    Zannie CovePreetha Areej Tayler, MD [facility] Pager [phone]  If 7PM-7AM, please contact night-coverage [email or web address] Password [email or web address] 05/02/2017, 2:07 PM

## 2017-05-02 NOTE — Progress Notes (Signed)
Patient admitted to [phone]. Oriented to room and unit. In NAD. Will follow.

## 2017-05-02 NOTE — Procedures (Signed)
ELECTROENCEPHALOGRAM REPORT  Date of Study: 05/02/2017  Patient's Name: Autumn SickleJacinta L Laflora MRN: [id number] Date of Birth: 1986-02-17  Referring Provider: Dr. Ritta SlotMcNeill Kirkpatrick  Clinical History: This is a 31 year old woman with left-sided tingling, headache, difficulty speaking, blurred vision.  Medications: Patient had 10 mg of Compazine, 30 mg Toradol, 25 mg Benadryl to abate her headache. acetaminophen (TYLENOL) tablet 500 mg  amLODipine (NORVASC) 5 MG tablet  aspirin EC tablet 325 mg  diphenhydrAMINE (BENADRYL) injection 25 mg  enoxaparin (LOVENOX) injection 80 mg  hydrALAZINE (APRESOLINE) injection 5 mg  ketorolac (TORADOL) 30 MG/ML injection 30 mg  ondansetron (ZOFRAN) injection 4 mg  prochlorperazine (COMPAZINE) injection 10 mg  senna-docusate (Senokot-S) tablet 1 tablet  SUMAtriptan (IMITREX) tablet 50 mg  zolpidem (AMBIEN) tablet 5 mg   Technical Summary: A multichannel digital EEG recording measured by the international 10-20 system with electrodes applied with paste and impedances below 5000 ohms performed in our laboratory with EKG monitoring in an awake and asleep patient.  Hyperventilation and photic stimulation were performed.  The digital EEG was referentially recorded, reformatted, and digitally filtered in a variety of bipolar and referential montages for optimal display.    Description: The patient is awake and asleep during the recording.  During maximal wakefulness, there is a symmetric, medium voltage 10-10.5 Hz posterior dominant rhythm that attenuates with eye opening.  The record is symmetric.  During drowsiness and sleep, there is an increase in theta slowing of the background.  Vertex waves and symmetric sleep spindles were seen.  Hyperventilation and photic stimulation did not elicit any abnormalities.  There were no epileptiform discharges or electrographic seizures seen.    EKG lead was unremarkable.  Impression: This awake and asleep EEG is normal.     Clinical Correlation: A normal EEG does not exclude a clinical diagnosis of epilepsy. Clinical correlation is advised.   Patrcia DollyKaren Aquino, M.D.

## 2017-05-02 NOTE — Progress Notes (Signed)
Routine EEG completed, results pending. 

## 2017-05-03 DIAGNOSIS — E119 Type 2 diabetes mellitus without complications: Secondary | ICD-10-CM

## 2017-05-03 LAB — CBC
HEMATOCRIT: 31.9 % — AB (ref 36.0–46.0)
Hemoglobin: 9.6 g/dL — ABNORMAL LOW (ref 12.0–15.0)
MCH: 22.1 pg — ABNORMAL LOW (ref 26.0–34.0)
MCHC: 30.1 g/dL (ref 30.0–36.0)
MCV: 73.5 fL — ABNORMAL LOW (ref 78.0–100.0)
PLATELETS: 339 10*3/uL (ref 150–400)
RBC: 4.34 MIL/uL (ref 3.87–5.11)
RDW: 16.3 % — AB (ref 11.5–15.5)
WBC: 8.3 10*3/uL (ref 4.0–10.5)

## 2017-05-03 LAB — BASIC METABOLIC PANEL
Anion gap: 7 (ref 5–15)
BUN: 5 mg/dL — AB (ref 6–20)
CALCIUM: 8.7 mg/dL — AB (ref 8.9–10.3)
CO2: 25 mmol/L (ref 22–32)
Chloride: 106 mmol/L (ref 101–111)
Creatinine, Ser: 0.71 mg/dL (ref 0.44–1.00)
GFR calc Af Amer: >60 mL/min (ref >60)
GLUCOSE: 106 mg/dL — AB (ref 65–99)
Potassium: 3.9 mmol/L (ref 3.5–5.1)
Sodium: 138 mmol/L (ref 135–145)

## 2017-05-03 LAB — IRON AND TIBC
IRON: 26 ug/dL — AB (ref 28–170)
Saturation Ratios: 6 % — ABNORMAL LOW (ref 10.4–31.8)
TIBC: 406 ug/dL (ref 250–450)
UIBC: 380 ug/dL

## 2017-05-03 LAB — FOLATE: Folate: 14 ng/mL (ref >5.9)

## 2017-05-03 LAB — RETICULOCYTES
RBC.: 4.31 MIL/uL (ref 3.87–5.11)
RETIC COUNT ABSOLUTE: 56 10*3/uL (ref 19.0–186.0)
RETIC CT PCT: 1.3 % (ref 0.4–3.1)

## 2017-05-03 LAB — FERRITIN: FERRITIN: 13 ng/mL (ref 11–307)

## 2017-05-03 LAB — VITAMIN B12: Vitamin B-12: 456 pg/mL (ref 180–914)

## 2017-05-03 MED ORDER — SUMATRIPTAN SUCCINATE 50 MG PO TABS: 50.0000 mg | ORAL_TABLET | ORAL | 0 refills | Status: DC | PRN

## 2017-05-03 MED ORDER — GABAPENTIN 300 MG PO CAPS
300.0000 mg | ORAL_CAPSULE | Freq: Three times a day (TID) | ORAL | Status: DC
  Administered 2017-05-03 (×2): 300 mg via ORAL
  Filled 2017-05-03: qty 1

## 2017-05-03 MED ORDER — FERROUS SULFATE 325 (65 FE) MG PO TABS: 325.0000 mg | ORAL_TABLET | Freq: Every day | ORAL | 0 refills | Status: DC

## 2017-05-03 MED ORDER — BLOOD GLUCOSE METER KIT: PACK | 0 refills | Status: AC

## 2017-05-03 MED ORDER — LIVING WELL WITH DIABETES BOOK
Freq: Once | Status: DC
  Filled 2017-05-03: qty 1

## 2017-05-03 MED ORDER — METFORMIN HCL ER 500 MG PO TB24: 500.0000 mg | ORAL_TABLET | Freq: Every day | ORAL | 0 refills | Status: DC

## 2017-05-03 MED ORDER — AMLODIPINE BESYLATE 5 MG PO TABS: 5.0000 mg | ORAL_TABLET | Freq: Every day | ORAL | 0 refills | Status: DC

## 2017-05-03 MED ORDER — GABAPENTIN 300 MG PO CAPS: 300.0000 mg | ORAL_CAPSULE | Freq: Three times a day (TID) | ORAL | 0 refills | Status: DC

## 2017-05-03 NOTE — Discharge Summary (Signed)
Physician Discharge Summary  Autumn [name] NWG:[id number] DOB: 01-20-1986 DOA: 05/01/2017  PCP: Patient, No Pcp Per  Admit date: 05/01/2017 Discharge date: 05/03/2017  Time spent: 35 minutes  Recommendations for Outpatient Follow-up:  1. Neurology Dr.Donika Allena Katz in 2weeks 2. PCP Dr.Rees in 1-2weeks   Discharge Diagnoses:  Principal Problem:   Complicated migraine Active Problems:   Hypertensive urgency   Essential hypertension   Normocytic anemia   Left-sided weakness   Type 2 diabetes mellitus   Discharge Condition: stable  Diet recommendation: Diabetic heart healthy  Filed Weights   05/01/17 1912  Weight: (!) 156.4 kg (344 lb 12.8 oz)    History of present illness:  Autumn [name] a 31 y.o.femalewith medical history significant of hypertension, headache, morbid obesity, who presented with left-sided weakness, tingling, headache, difficulty speaking and blurry vision. At about 6:00 PM, she started having left-sided weakness, tingling, headache, difficulty speaking, blurry vision.Her headaches is a located in the frontal area, constant, 10 out of 10 in severity, nonradiating. Her symptoms improved in ED, but still has difficulty speaking and left sided weakness.  Hospital Course:  Complicated migraine -h/o recurrent episodes of speech difficulty, paresthesia, sometimes associated with headache -with mild residual L sided weakness on admission -CT and MRI unremarkable -Neuro consulted, felt to be complicated migraine -EEG completed and normal -much improved, gabapentin 300mg  TID recommended 2-3days for residual migraine    Hypertensive urgency -due to above stress likely, improved -started amlodipine -stable now    Hyperglycemia/New DM -Hba1c is 6.5, pt educated abt new DM, started on metformin    Iron deficiency Anemia -anemia panel c/w iron defi, B12 normal   Obesity -life style modification   Consultations:  Neurology  Discharge  Exam: Vitals:   05/03/17 0500 05/03/17 1018  BP: (!) 139/55 (!) 142/89  Pulse: 89 88  Resp: 20 20  Temp: 98.7 F (37.1 C) 98.9 F (37.2 C)  SpO2: 99% 99%    General: AAOx3 Cardiovascular: S1S2/RRR Respiratory: CTAB  Discharge Instructions   Discharge Instructions    Diet - low sodium heart healthy    Complete by:  As directed    Diet Carb Modified    Complete by:  As directed    Increase activity slowly    Complete by:  As directed      Current Discharge Medication List    START taking these medications   Details  amLODipine (NORVASC) 5 MG tablet Take 1 tablet (5 mg total) by mouth daily. Qty: 30 tablet, Refills: 0    ferrous sulfate (FERROUSUL) 325 (65 FE) MG tablet Take 1 tablet (325 mg total) by mouth daily with breakfast. Qty: 30 tablet, Refills: 0    gabapentin (NEURONTIN) 300 MG capsule Take 1 capsule (300 mg total) by mouth 3 (three) times daily. For 2days for residual migraine Qty: 7 capsule, Refills: 0    metFORMIN (GLUCOPHAGE-XR) 500 MG 24 hr tablet Take 1 tablet (500 mg total) by mouth daily with breakfast. Qty: 30 tablet, Refills: 0    SUMAtriptan (IMITREX) 50 MG tablet Take 1 tablet (50 mg total) by mouth as needed for migraine or headache. May repeat in 2 hours if headache persists or recurs. Do not take more than 2 a day Qty: 20 tablet, Refills: 0      CONTINUE these medications which have NOT CHANGED   Details  acetaminophen (TYLENOL) 500 MG tablet Take 500 mg by mouth every 6 (six) hours as needed for mild pain or headache.  aspirin EC 325 MG tablet Take 325 mg by mouth daily.       No Known Allergies Follow-up Information    Leilani Able, MD. Schedule an appointment as soon as possible for a visit in 2 week(s).   Specialty:  Family Medicine Contact information: [address] [place] [address] [phone]        Glendale Chard, DO. Schedule an appointment as soon as possible for a visit in 2 week(s).   Specialty:   Neurology Contact information: [address] [place] [address] [phone]        Yvonna Alanis, MD Follow up in 1 month(s).   Specialty:  Neurology Why:  [facility] information: [address] [place] [address] [phone]        [facility] Follow up on 05/09/2017.   Why:  Your appointment time is 9:30. Please arrive 15 min early and bring a picture ID and your current medications. Contact information: [address] [place] [address]           The results of significant diagnostics from this hospitalization (including imaging, microbiology, ancillary and laboratory) are listed below for reference.    Significant Diagnostic Studies: Mr Brain 34 Contrast  Result Date: 05/01/2017 CLINICAL DATA:  Sudden onset left-sided arm and leg weakness with tingling, expressive aphasia, and facial weakness. EXAM: MRI HEAD WITHOUT CONTRAST TECHNIQUE: Axial DWI sequence. COMPARISON:  05/01/2017 CT head. FINDINGS: Only axial DWI sequence was acquired, patient claustrophobic. No focus of reduced diffusion to suggest acute or early subacute infarct. Normal brain parenchymal volume and ventricle size. IMPRESSION: No evidence of acute or early subacute infarction. Electronically Signed   By: Mitzi Hansen M.D.   On: 05/01/2017 19:48   Ct Head Code Stroke Wo Contrast  Result Date: 05/01/2017 CLINICAL DATA:  Code stroke.  Left-sided weakness EXAM: CT HEAD WITHOUT CONTRAST TECHNIQUE: Contiguous axial images were obtained from the base of the skull through the vertex without intravenous contrast. COMPARISON:  None. FINDINGS: Brain: No mass lesion or acute hemorrhage. No focal hypoattenuation of the basal ganglia or cortex to indicate infarcted tissue. No hydrocephalus or age advanced atrophy. Incidentally noted partially empty sella. Vascular: No hyperdense vessel. No advanced  atherosclerotic calcification of the arteries at the skull base. Skull: Normal visualized skull base, calvarium and extracranial soft tissues. Sinuses/Orbits: No sinus fluid levels or advanced mucosal thickening. No mastoid effusion. Normal orbits. ASPECTS [facility] Stroke Program Early CT Score) - Ganglionic level infarction (caudate, lentiform nuclei, internal capsule, insula, M1-M3 cortex): 7 - Supraganglionic infarction (M4-M6 cortex): 3 Total score (0-10 with 10 being normal): 10 IMPRESSION: 1. No acute hemorrhage or mass lesion. 2. Normal head CT. 3. ASPECTS is 10. These results were called by telephone at the time of interpretation on 05/01/2017 at 7:03 pm to Dr. Ritta Slot, who verbally acknowledged these results. Electronically Signed   By: Deatra Robinson M.D.   On: 05/01/2017 19:09    Microbiology: No results found for this or any previous visit (from the past 240 hour(s)).   Labs: Basic Metabolic Panel:  Recent Labs Lab 05/01/17 1858 05/03/17 0015  NA 136  139 138  K 4.0  4.1 3.9  CL 105  104 106  CO2 25 25  GLUCOSE 169*  167* 106*  BUN 7  6 5*  CREATININE 0.70  0.60 0.71  CALCIUM 9.0 8.7*   Liver Function Tests:  Recent Labs Lab 05/01/17 1858  AST 22  ALT 20  ALKPHOS 91  BILITOT 0.4  PROT 8.1  ALBUMIN 3.2*   No results for input(s): LIPASE, AMYLASE in the last 168 hours. No results for input(s): AMMONIA in the last 168 hours. CBC:  Recent Labs Lab 05/01/17 1858 05/03/17 0015  WBC 7.0 8.3  NEUTROABS 3.4  --   HGB 10.6*  12.2 9.6*  HCT 34.7*  36.0 31.9*  MCV 74.3* 73.5*  PLT 347 339   Cardiac Enzymes: No results for input(s): CKTOTAL, CKMB, CKMBINDEX, TROPONINI in the last 168 hours. BNP: BNP (last 3 results) No results for input(s): BNP in the last 8760 hours.  ProBNP (last 3 results) No results for input(s): PROBNP in the last 8760 hours.  CBG: No results for input(s): GLUCAP in the last 168 hours.     SignedZannie Cove:  Percy Winterrowd  MD.  [facility] 05/03/2017, 1:36 PM

## 2017-05-03 NOTE — Progress Notes (Signed)
OT Cancellation Note and Discharge  Patient Details Name: Autumn [name] MRN: [id number] DOB: 09/15/1986   Cancelled Treatment:    Reason Eval/Treat Not Completed: OT screened, no needs identified, will sign off. Spoke with PT and no acute OT needs identified.  [name], [name] [phone] 05/03/2017, 11:26 AM

## 2017-05-03 NOTE — Evaluation (Signed)
Speech Language Pathology Evaluation Patient Details Name: Autumn [name] MRN: [id number] DOB: 02/18/1986 Today's Date: 05/03/2017 Time: [id number] SLP Time Calculation (min) (ACUTE ONLY): 10 min  Problem List:  Patient Active Problem List   Diagnosis Date Noted  . Type 2 diabetes mellitus without complication (HCC) 05/03/2017  . Complicated migraine 05/02/2017  . Hypertensive urgency 05/02/2017  . Normocytic anemia 05/02/2017  . Left-sided weakness 05/02/2017  . Essential hypertension    Past Medical History:  Past Medical History:  Diagnosis Date  . Essential hypertension   . Headache    Past Surgical History:  Past Surgical History:  Procedure Laterality Date  . CESAREAN SECTION     HPI:  31 year old female with recurrent episodes of speech difficulty, paresthesia, sometimes associated with headache. MRI negative for acute stroke. Per neuro, description of the events is much more consistent with, acute migraine then other etiologies.   Assessment / Plan / Recommendation Clinical Impression  Cognitive-linguistic evaluation complete with patient performing WFL on all areas assessed. No further SLP needs indicated.     SLP Assessment  SLP Recommendation/Assessment: Patient does not need any further Speech Lanaguage Pathology Services SLP Visit Diagnosis: Cognitive communication deficit (R41.841)    Follow Up Recommendations  None          SLP Evaluation Cognition  Overall Cognitive Status: Within Functional Limits for tasks assessed Orientation Level: Oriented X4       Comprehension  Auditory Comprehension Overall Auditory Comprehension: Appears within functional limits for tasks assessed Visual Recognition/Discrimination Discrimination: Within Function Limits    Expression Expression Primary Mode of Expression: Verbal Verbal Expression Overall Verbal Expression: Appears within functional limits for tasks assessed Written Expression Dominant Hand: Right    Oral / Motor  Oral Motor/Sensory Function Overall Oral Motor/Sensory Function: Within functional limits Motor Speech Overall Motor Speech: Appears within functional limits for tasks assessed   GO                   Ferdinand LangoLeah Ahmeer Tuman MA, CCC-SLP [phone]  [name] 05/03/2017, 12:01 PM

## 2017-05-03 NOTE — Care Management Note (Signed)
Case Management Note  Patient Details  Name: Nita SickleJacinta L Laflora MRN: [id number] Date of Birth: 01/27/1986  Subjective/Objective:                    Action/Plan: Pt discharging home with self care. Pt has no PCP and no insurance. CM was [facility]. Information on the AVS. Patient also encouraged [facility] and as long as she is [facility] for medication assistance.  Patient states she has transportation home today.   Expected Discharge Date:  05/03/17               Expected Discharge Plan:  Home/Self Care  In-House Referral:     Discharge planning Services  CM Consult  Post Acute Care Choice:    Choice offered to:     DME Arranged:    DME Agency:     HH Arranged:    HH Agency:     Status of Service:  Completed, signed off  If discussed at [employer] Length of Stay Meetings, dates discussed:    Additional Comments:  Kermit BaloKelli F Delmas Faucett, RN 05/03/2017, 2:29 PM

## 2017-05-03 NOTE — Progress Notes (Signed)
Subjective: Is doing much better with a 2/10 HA. Walking in room and has no complaints.   Exam: Vitals:   05/03/17 0500 05/03/17 1018  BP: (!) 139/55 (!) 142/89  Pulse: 89 88  Resp: 20 20  Temp: 98.7 F (37.1 C) 98.9 F (37.2 C)  SpO2: 99% 99%    HEENT-  Normocephalic, no lesions, without obvious abnormality.  Normal external eye and conjunctiva.  Normal TM's bilaterally.  Normal auditory canals and external ears. Normal external nose, mucus membranes and septum.  Normal pharynx.    Neuro:  CN: Pupils are equal and round. They are symmetrically reactive from 3-->2 mm. EOMI without nystagmus. Facial sensation is intact to light touch. Face is symmetric at rest with normal strength and mobility. Hearing is intact to conversational voice. Palate elevates symmetrically and uvula is midline. Voice is normal in tone, pitch and quality. Bilateral SCM and trapezii are 5/5. Tongue is midline with normal bulk and mobility.  Motor: Normal bulk, tone, and strength. 5/5 throughout. No drift.  Sensation: Intact to light touch.  DTRs: 2+, symmetric  Toes downgoing bilaterally. No pathologic reflexes.  Coordination: Finger-to-nose and heel-to-shin are without dysmetria     Pertinent Labs/Diagnostics: EEG showed no epileptiform activity    Impression:  31 year old female with recurrent episodes of speech difficulty, paresthesia, sometimes associated with headache. Description of the events is much more consistent with, acute migraine then other etiologies.  Recommendations: 1) At this time no further recommendations 2) Follow up with Dr. Allena KatzPatel as out patient as she was to follow up this month.  3) Neurontin 300 mg TID  Neurology S/O    8/9/2018David Ula LingoSmith PA-C [facility] [phone], 10:22 AM

## 2017-05-03 NOTE — Progress Notes (Signed)
Inpatient Diabetes Program Recommendations  AACE/ADA: New Consensus Statement on Inpatient Glycemic Control (2015)  Target Ranges:  Prepandial:   less than 140 mg/dL      Peak postprandial:   less than 180 mg/dL (1-2 hours)      Critically ill patients:  140 - 180 mg/dL   Lab Results  Component Value Date   HGBA1C 6.5 (H) 05/02/2017   Spoke with patient about new diabetes diagnosis.  Discussed A1C results (6.5%) and explained what an A1C is. Discussed basic pathophysiology of DM Type 2, basic home care, importance of checking CBGs and maintaining good CBG control to prevent long-term and short-term complications. Reviewed glucose and A1C goals and explained that patient will need to continue to  Reviewed signs and symptoms of hyperglycemia and hypoglycemia along with treatment for both. Discussed impact of nutrition, exercise, stress, sickness, and medications on diabetes control. Discussed carbohydrates, carbohydrate goals per day and meal, along with portion sizes.  Asked patient to check glucose 2 times per day. Patient verbalized understanding of information discussed and has no further questions at this time related to diabetes. RNs to provide ongoing basic DM education at bedside with this patient and engage patient to actively check blood glucose. Patient knows to follow up with PCP for continued monitoring of A1c level.  Mentioned to patient about oral medications, if ordered patient will need glucose meter at time of d/c (order #[id number])  Thanks, Christena DeemShannon Darah Simkin RN, MSN, [facility] Inpatient Diabetes Coordinator Team Pager [phone] (8a-5p)

## 2017-05-03 NOTE — Evaluation (Signed)
Physical Therapy Evaluation and Discharge Patient Details Name: Autumn [name] MRN: [id number] DOB: [birth year] Today's Date: 05/03/2017   History of Present Illness  Pt is a 31 y/o female admitted secondary to headache and numbness, attributed to complicated migraines. MRI was negative. PMH including but not limited to HTN.  Clinical Impression  Pt presented supine in bed with HOB elevated, awake and willing to participate in therapy session. Prior to admission, pt reported that she was independent with all functional mobility. Pt ambulated in hallway with supervision for safety without use of an AD. Pt also successfully completed stair training with no issues. No further acute PT needs identified at this time. PT signing off.    Follow Up Recommendations No PT follow up    Equipment Recommendations  None recommended by PT    Recommendations for Other Services       Precautions / Restrictions Precautions Precautions: None Restrictions Weight Bearing Restrictions: No      Mobility  Bed Mobility Overal bed mobility: Independent                Transfers Overall transfer level: Independent Equipment used: None                Ambulation/Gait Ambulation/Gait assistance: Supervision Ambulation Distance (Feet): 200 Feet Assistive device: None Gait Pattern/deviations: Step-through pattern;Decreased stride length;Wide base of support Gait velocity: decreased Gait velocity interpretation: Below normal speed for age/gender General Gait Details: no instability or LOB, supervision for safety; pt with slow, steady gait  Stairs Stairs: Yes Stairs assistance: Supervision Stair Management: Two rails;Step to pattern;Forwards Number of Stairs: 5    Wheelchair Mobility    Modified Rankin (Stroke Patients Only)       Balance Overall balance assessment: Needs assistance Sitting-balance support: Feet supported Sitting balance-Leahy Scale: Normal     Standing  balance support: No upper extremity supported Standing balance-Leahy Scale: Good                               Pertinent Vitals/Pain Pain Assessment: No/denies pain    Home Living Family/patient expects to be discharged to:: Private residence Living Arrangements: Spouse/significant other;Children Available Help at Discharge: Family;Available PRN/intermittently Type of Home: Apartment Home Access: Stairs to enter Entrance Stairs-Rails: Doctor, general practice of Steps: 5 Home Layout: Two level;Bed/bath upstairs Home Equipment: None      Prior Function Level of Independence: Independent               Hand Dominance   Dominant Hand: Right    Extremity/Trunk Assessment   Upper Extremity Assessment Upper Extremity Assessment: LUE deficits/detail LUE Deficits / Details: pt with reduced L grip strength as compared to R. pt also with dminimshed sensation to light touch.    Lower Extremity Assessment Lower Extremity Assessment: Overall WFL for tasks assessed    Cervical / Trunk Assessment Cervical / Trunk Assessment: Normal  Communication   Communication: No difficulties  Cognition Arousal/Alertness: Awake/alert Behavior During Therapy: WFL for tasks assessed/performed Overall Cognitive Status: Within Functional Limits for tasks assessed                                        General Comments      Exercises     Assessment/Plan    PT Assessment Patent does not need any further PT services  PT Problem  List         PT Treatment Interventions      PT Goals (Current goals can be found in the Care Plan section)  Acute Rehab PT Goals Patient Stated Goal: return home    Frequency     Barriers to discharge        Co-evaluation               AM-PAC PT "6 Clicks" Daily Activity  Outcome Measure Difficulty turning over in bed (including adjusting bedclothes, sheets and blankets)?: None Difficulty moving from  lying on back to sitting on the side of the bed? : None Difficulty sitting down on and standing up from a chair with arms (e.g., wheelchair, bedside commode, etc,.)?: None Help needed moving to and from a bed to chair (including a wheelchair)?: None Help needed walking in hospital room?: None Help needed climbing 3-5 steps with a railing? : A Little 6 Click Score: 23    End of Session   Activity Tolerance: Patient tolerated treatment well Patient left: in bed;with call bell/phone within reach;with family/visitor present Nurse Communication: Mobility status PT Visit Diagnosis: Other symptoms and signs involving the nervous system (Z61.096(R29.898)    Time: [id number] PT Time Calculation (min) (ACUTE ONLY): 15 min   Charges:   PT Evaluation $PT Eval Moderate Complexity: 1 Mod     PT G Codes:        Fish HawkJennifer Langley Ingalls, PT, DPT [phone]   Alessandra BevelsJennifer M Maveryk Renstrom 05/03/2017, 10:01 AM

## 2017-05-04 ENCOUNTER — Encounter: Payer: Self-pay | Admitting: Neurology

## 2017-05-04 NOTE — Telephone Encounter (Signed)
She has not been seen in over a year.  I spoke to Autumn [name] and informed her that patient would need to be seen first by Dr. Allena KatzPatel.  Will call her back with an appointment.

## 2017-05-09 ENCOUNTER — Encounter: Payer: Self-pay | Admitting: Family Medicine

## 2017-05-09 ENCOUNTER — Ambulatory Visit (INDEPENDENT_AMBULATORY_CARE_PROVIDER_SITE_OTHER): Payer: Self-pay | Admitting: Family Medicine

## 2017-05-09 VITALS — BP 144/88 | HR 88 | Temp 98.2°F | Resp 14 | Ht 64.0 in | Wt 336.0 lb

## 2017-05-09 DIAGNOSIS — G629 Polyneuropathy, unspecified: Secondary | ICD-10-CM

## 2017-05-09 DIAGNOSIS — E119 Type 2 diabetes mellitus without complications: Secondary | ICD-10-CM

## 2017-05-09 DIAGNOSIS — I10 Essential (primary) hypertension: Secondary | ICD-10-CM

## 2017-05-09 LAB — POCT URINALYSIS DIP (DEVICE)
Bilirubin Urine: NEGATIVE
GLUCOSE, UA: NEGATIVE mg/dL
Hgb urine dipstick: NEGATIVE
Ketones, ur: NEGATIVE mg/dL
NITRITE: NEGATIVE
PROTEIN: NEGATIVE mg/dL
Specific Gravity, Urine: >=1.03 (ref 1.005–1.030)
UROBILINOGEN UA: 0.2 mg/dL (ref 0.0–1.0)
pH: 5.5 (ref 5.0–8.0)

## 2017-05-09 MED ORDER — LOSARTAN POTASSIUM 25 MG PO TABS: 25.0000 mg | ORAL_TABLET | Freq: Every day | ORAL | 1 refills | Status: DC

## 2017-05-09 MED ORDER — GABAPENTIN 300 MG PO CAPS: 300.0000 mg | ORAL_CAPSULE | Freq: Three times a day (TID) | ORAL | 2 refills | Status: DC

## 2017-05-09 MED ORDER — HYDROCHLOROTHIAZIDE 25 MG PO TABS: 12.5000 mg | ORAL_TABLET | Freq: Every day | ORAL | 3 refills | Status: DC

## 2017-05-09 NOTE — Progress Notes (Signed)
Patient ID: Autumn [name], female    DOB: 09-01-1986, 31 y.o.   MRN: [id number]  PCP: Scot Jun, FNP  Chief Complaint  Patient presents with  . Establish Care  . Hospitalization Follow-up    Subjective:  HPI Autumn [name] is a 31 y.o. female presents to establish care and hospital follow-up.  Medical problems include: Newly Diagnosed Type 2 Diabetes, Morbid Obesity, Hypertension, and Chronic Headaches.  Denys recently was admitted to [facility] for complicated migraine and hypertensive urgency. Patient headache was accompanied by left-sided weakness, tingling, headache, difficulty speaking, and blurry vision. Her headache intensity was 10/10 during her admission. She had a normal EEG, CT, and MRI. Headhace improved with Gabapentin 300 mg TID which she is continuing to take for outpatient management of headaches. During her admission she was found to be hypertensive and newly diagnosed Type 2 Diabetes. She was discharged on amlodipine for BP management and metformin for treatment of diabetes. Hemoglobin A1C 6.5. Current Body mass index is 57.67 kg/m.   Today, Autumn [name] continues to complain of daily headaches, however reports ,marked improvement regarding the intensity of the headaches with Gabapentin. Denies any episodes of visual disturbances, dizziness, weakness, nausea, or vomiting. She also complains of ongoing right lateral foot pain. The pain occurs most often at night and is characterized as aching. She denies any prior injury and this problem has been an ongoing gradually worsening problem. She has not attempted relief with any medication.   Social History   Social History  . Marital status: Married    Spouse name: N/A  . Number of children: N/A  . Years of education: N/A   Occupational History  . Not on file.   Social History Main Topics  . Smoking status: Never Smoker  . Smokeless tobacco: Never Used  . Alcohol use No  . Drug use:  No  . Sexual activity: Yes    Birth control/ protection: None   Other Topics Concern  . Not on file   Social History Narrative   ** Merged History Encounter **       Lives with husband and 2 children in a one story home.   Works as an Building control surveyor.   Education: college degree.    Family History  Problem Relation Age of Onset  . Hypertension Mother   . Diabetes Mellitus II Mother   . CAD Father   . Hyperlipidemia Father   . Hypertension Sister   . Diabetes Mellitus II Sister   . Breast cancer Mother 29  . Heart failure Mother   . Diabetes Sister   . Hypertension Sister   . Thyroid disease Sister   . Anemia Sister   . Anesthesia problems Neg Hx   . Heart disease Father   . Heart attack Father   . Heart murmur Sister   . Heart murmur Daughter     Review of Systems See HPI  Patient Active Problem List   Diagnosis Date Noted  . Type 2 diabetes mellitus without complication ([place]) 42/59/5638  . Complicated migraine 75/64/3329  . Hypertensive urgency 05/02/2017  . Normocytic anemia 05/02/2017  . Left-sided weakness 05/02/2017  . Essential hypertension   . Myofascial pain 01/13/2016  . Weakness 01/13/2016  . Pain in limb 01/13/2016  . Chest pain at rest 12/14/2015  . Preeclampsia complicating hypertension 11/29/2013  . Chronic hypertension with superimposed preeclampsia 11/28/2013  . Positive GBS test 11/28/2013  . Chronic headaches 11/28/2013  . Leaky heart valve  05/12/2012  . Morbid obesity ([place]) 01/22/2012    No Known Allergies  Prior to Admission medications   Medication Sig Start Date End Date Taking? Authorizing Provider  acetaminophen (TYLENOL) 500 MG tablet Take 500 mg by mouth every 6 (six) hours as needed for mild pain or headache.   Yes [provider]  amLODipine (NORVASC) 5 MG tablet Take 1 tablet (5 mg total) by mouth daily. 05/03/17  Yes Domenic Polite, MD  aspirin EC 325 MG tablet Take 325 mg by mouth daily.   Yes [provider]  atorvastatin (LIPITOR) 10 MG tablet Take 10 mg by mouth daily. 10/23/15  Yes [provider]  bisoprolol (ZEBETA) 5 MG tablet Take 5 mg by mouth daily. 10/23/15  Yes [provider]  blood glucose meter kit and supplies Dispense based on patient and insurance preference. Use up to four times daily as directed. (FOR ICD-9 250.00, 250.01). 05/03/17  Yes Domenic Polite, MD  ferrous sulfate (FERROUSUL) 325 (65 FE) MG tablet Take 1 tablet (325 mg total) by mouth daily with breakfast. 05/03/17  Yes Domenic Polite, MD  lisinopril-hydrochlorothiazide (PRINZIDE,ZESTORETIC) 20-25 MG tablet Take 1 tablet by mouth every morning. 10/23/15  Yes [provider]  metFORMIN (GLUCOPHAGE-XR) 500 MG 24 hr tablet Take 1 tablet (500 mg total) by mouth daily with breakfast. 05/03/17  Yes Domenic Polite, MD  SUMAtriptan (IMITREX) 50 MG tablet Take 1 tablet (50 mg total) by mouth as needed for migraine or headache. May repeat in 2 hours if headache persists or recurs. Do not take more than 2 a day 05/03/17  Yes Domenic Polite, MD  CONTRAVE 8-90 MG TB12 1ST WEEK-1TAB AM,2ND WEEK-1TAB AM,1TAB PM,3RD WEEK-2TABS AM,1TAB PM,4TH WEEK 2TABS AM,2TABS PM 10/23/15   [provider]  gabapentin (NEURONTIN) 300 MG capsule Take 1 capsule (300 mg total) by mouth 3 (three) times daily. For 2days for residual migraine 05/03/17 05/05/17  Domenic Polite, MD  Past Medical, Surgical Family and Social History reviewed and updated.   Objective:   Today's Vitals   05/09/17 0937  BP: (!) 144/88  Pulse: 88  Resp: 14  Temp: 98.2 F (36.8 C)  TempSrc: Oral  SpO2: 100%  Weight: (!) 336 lb (152.4 kg)  Height: _0  (1.626 m)    Wt Readings from Last 3 Encounters:  05/09/17 (!) 336 lb (152.4 kg)  05/01/17 (!) 344 lb 12.8 oz (156.4 kg)  02/04/16 (!) 312 lb 6 oz (141.7 kg)    Physical Exam  Constitutional: She is oriented to person, place, and time. She appears well-developed and well-nourished.   HENT:  Head: Normocephalic and atraumatic.  Eyes: Pupils are equal, round, and reactive to light. Conjunctivae and EOM are normal.  Neck: Normal range of motion. Neck supple.  Cardiovascular: Normal rate, regular rhythm, normal heart sounds and intact distal pulses.   Pulmonary/Chest: Effort normal and breath sounds normal.  Abdominal: Soft. Bowel sounds are normal.  Neurological: She is alert and oriented to person, place, and time. She displays normal reflexes. She exhibits normal muscle tone. Coordination normal.  Skin: Skin is warm and dry.  Psychiatric: She has a normal mood and affect. Her behavior is normal. Judgment and thought content normal.   Assessment & Plan:  1. Neuropathy - Gabapentin 300 mg, 3 times daily  2. Type 2 diabetes mellitus without complication, without long-term current use of insulin (HCC) - Microalbumin/Creatinine Ratio, Urine  3. Essential hypertension -Continue Amlodipine  -Adding hydrochlorothiazide 12.5 mg daily.   -Increase  physical activity 15 minutes at minimum 3 times per day and gradually increase to 150 minutes per week.   RTC: 8 weeks for chronic disease management   Carroll Sage. Kenton Kingfisher, MSN, FNP-C [facility]  [address] Cower [place], [place] [address] [phone]

## 2017-05-09 NOTE — Patient Instructions (Signed)
Continue all previous medications as prescribed.  For blood pressure control in kidney protection I am adding losartan 25 mg once daily.  For fluid retention you may take hydrochlorothiazide 12.5 mg as needed to prevent lower extremity swelling.  For headache and right foot pain continue gabapentin 300 mg 3 times a day as needed for daily headaches continue Imitrex exceed 2 tablets within 24 hours.  I will see back in office in 3 months for follow-up of diabetes, Pap smear, and return for fasting labs.

## 2017-05-10 LAB — MICROALBUMIN / CREATININE URINE RATIO
CREATININE, URINE: 241 mg/dL (ref 20–320)
MICROALB UR: 2.8 mg/dL
Microalb Creat Ratio: 12 mcg/mg creat (ref <30)

## 2017-07-09 ENCOUNTER — Ambulatory Visit: Payer: Self-pay | Admitting: Family Medicine

## 2017-07-13 ENCOUNTER — Ambulatory Visit: Payer: 59 | Admitting: Neurology

## 2017-08-28 ENCOUNTER — Ambulatory Visit (INDEPENDENT_AMBULATORY_CARE_PROVIDER_SITE_OTHER): Payer: Medicaid Other | Admitting: Family Medicine

## 2017-08-28 ENCOUNTER — Telehealth: Payer: Self-pay | Admitting: Family Medicine

## 2017-08-28 ENCOUNTER — Encounter: Payer: Self-pay | Admitting: Family Medicine

## 2017-08-28 VITALS — BP 144/90 | HR 60 | Temp 98.2°F | Resp 16 | Ht 64.0 in | Wt 329.0 lb

## 2017-08-28 DIAGNOSIS — I10 Essential (primary) hypertension: Secondary | ICD-10-CM | POA: Diagnosis not present

## 2017-08-28 DIAGNOSIS — Z13220 Encounter for screening for lipoid disorders: Secondary | ICD-10-CM | POA: Diagnosis not present

## 2017-08-28 DIAGNOSIS — R413 Other amnesia: Secondary | ICD-10-CM | POA: Diagnosis not present

## 2017-08-28 DIAGNOSIS — E119 Type 2 diabetes mellitus without complications: Secondary | ICD-10-CM | POA: Diagnosis not present

## 2017-08-28 DIAGNOSIS — N926 Irregular menstruation, unspecified: Secondary | ICD-10-CM

## 2017-08-28 LAB — POCT URINE PREGNANCY: Preg Test, Ur: NEGATIVE

## 2017-08-28 LAB — POCT GLYCOSYLATED HEMOGLOBIN (HGB A1C): Hemoglobin A1C: 6

## 2017-08-28 MED ORDER — AMLODIPINE BESYLATE 10 MG PO TABS: 10.0000 mg | ORAL_TABLET | Freq: Every day | ORAL | 3 refills | Status: DC

## 2017-08-28 MED ORDER — METFORMIN HCL ER 500 MG PO TB24: 500.0000 mg | ORAL_TABLET | Freq: Every day | ORAL | 3 refills | Status: DC

## 2017-08-28 MED ORDER — SUMATRIPTAN SUCCINATE 50 MG PO TABS: 50.0000 mg | ORAL_TABLET | ORAL | 6 refills | Status: DC | PRN

## 2017-08-28 NOTE — Progress Notes (Signed)
Patient ID: Autumn [name], female    DOB: [birth year], 31 y.o.   MRN: [id number]  PCP: Scot Jun, FNP  Chief Complaint  Patient presents with  . Follow-up    chronic condition  . Menstrual Problem  . Memory Loss    Subjective:  HPI Autumn [name] is a 31 y.o. female presents for evaluation of chronic conditions and new concern of decreased memory.  Irregular menses  Menstrual period irregularity persisting for more than 1 year, then normalized. Of recent, her cycle will begin with spotting for several days and then a very heavy menses, followed by additional spotting. Spotting is occurring randomly throughout the month and at times, she is having a cycle up to 14 days. She occasionally notices bleeding with sexual intercourse, initially spotting and then very heavy. Denies pain with sexual intercourse and is absent of pelvic cramping. She has no known history of fibroids.  Patient's last menstrual period was 08/22/2017.  Memory issues Autumn [name] was hospitalized in August with an non intractable headache with associated facial weakness, blurring of vision, and difficulty speaking. CVA was ruled out. However since this time, Autumn [name] reports decreased ability to remember certain things. Memory is related mostly to short-term. Reports misplacing keys, unable to recall date or day, and unable to recall verbal commands a few moments after information is spoken. No family history of Alzheimer's or dementia, no new weakness,   short-term memory is impaired.   Hypertension  Autumn [name] reports no routine home monitoring of blood pressure. She arrived today with accelerated hypertension 180/100 on arrival. She reports compliance with medication. She is current prescribed. Reports efforts to adhere to low sodium diet. She is a nonsmoker. Reports engaging in some walking occasional. Denies any episodes of dizziness, headaches, shortness of breath, or chest pain.   Social History    Socioeconomic History  . Marital status: Married    Spouse name: Not on file  . Number of children: Not on file  . Years of education: Not on file  . Highest education level: Not on file  Social Needs  . Financial resource strain: Not on file  . Food insecurity - worry: Not on file  . Food insecurity - inability: Not on file  . Transportation needs - medical: Not on file  . Transportation needs - non-medical: Not on file  Occupational History  . Not on file  Tobacco Use  . Smoking status: Never Smoker  . Smokeless tobacco: Never Used  Substance and Sexual Activity  . Alcohol use: No  . Drug use: No  . Sexual activity: Yes    Birth control/protection: None  Other Topics Concern  . Not on file  Social History Narrative   ** Merged History Encounter **       Lives with husband and 2 children in a one story home.   Works as an Building control surveyor.   Education: college degree.    Family History  Problem Relation Age of Onset  . Hypertension Mother   . Diabetes Mellitus II Mother   . CAD Father   . Hyperlipidemia Father   . Hypertension Sister   . Diabetes Mellitus II Sister   . Breast cancer Mother 87  . Heart failure Mother   . Diabetes Sister   . Hypertension Sister   . Thyroid disease Sister   . Anemia Sister   . Anesthesia problems Neg Hx   . Heart disease Father   . Heart attack Father   .  Heart murmur Sister   . Heart murmur Daughter    Review of Systems  Constitutional: Negative.   Respiratory: Negative.   Cardiovascular: Negative.   Endocrine: Negative.   Genitourinary: Positive for menstrual problem.  Psychiatric/Behavioral:       Memory concerns    Patient Active Problem List   Diagnosis Date Noted  . Type 2 diabetes mellitus without complication ([place]) 14/78/2956  . Complicated migraine 21/30/8657  . Hypertensive urgency 05/02/2017  . Normocytic anemia 05/02/2017  . Left-sided weakness 05/02/2017  . Essential hypertension   . Myofascial  pain 01/13/2016  . Weakness 01/13/2016  . Pain in limb 01/13/2016  . Chest pain at rest 12/14/2015  . Preeclampsia complicating hypertension 11/29/2013  . Chronic hypertension with superimposed preeclampsia 11/28/2013  . Positive GBS test 11/28/2013  . Chronic headaches 11/28/2013  . Leaky heart valve 05/12/2012  . Morbid obesity ([place]) 01/22/2012    No Known Allergies  Prior to Admission medications   Medication Sig Start Date End Date Taking? Authorizing Provider  acetaminophen (TYLENOL) 500 MG tablet Take 500 mg by mouth every 6 (six) hours as needed for mild pain or headache.   Yes [provider]  amLODipine (NORVASC) 5 MG tablet Take 1 tablet (5 mg total) by mouth daily. 05/03/17  Yes Domenic Polite, MD  aspirin EC 325 MG tablet Take 325 mg by mouth daily.   Yes [provider]  atorvastatin (LIPITOR) 10 MG tablet Take 10 mg by mouth daily. 10/23/15  Yes [provider]  bisoprolol (ZEBETA) 5 MG tablet Take 5 mg by mouth daily. 10/23/15  Yes [provider]  blood glucose meter kit and supplies Dispense based on patient and insurance preference. Use up to four times daily as directed. (FOR ICD-9 250.00, 250.01). 05/03/17  Yes Domenic Polite, MD  ferrous sulfate (FERROUSUL) 325 (65 FE) MG tablet Take 1 tablet (325 mg total) by mouth daily with breakfast. 05/03/17  Yes Domenic Polite, MD  hydrochlorothiazide (HYDRODIURIL) 25 MG tablet Take 0.5 tablets (12.5 mg total) by mouth daily. 05/09/17  Yes Scot Jun, FNP  losartan (COZAAR) 25 MG tablet Take 1 tablet (25 mg total) by mouth daily. 05/09/17  Yes Scot Jun, FNP  metFORMIN (GLUCOPHAGE-XR) 500 MG 24 hr tablet Take 1 tablet (500 mg total) by mouth daily with breakfast. 05/03/17  Yes Domenic Polite, MD  SUMAtriptan (IMITREX) 50 MG tablet Take 1 tablet (50 mg total) by mouth as needed for migraine or headache. May repeat in 2 hours if headache persists or recurs. Do not take more than 2 a day  05/03/17  Yes Domenic Polite, MD  CONTRAVE 8-90 MG TB12 1ST WEEK-1TAB AM,2ND WEEK-1TAB AM,1TAB PM,3RD WEEK-2TABS AM,1TAB PM,4TH WEEK 2TABS AM,2TABS PM 10/23/15   [provider]  gabapentin (NEURONTIN) 300 MG capsule Take 1 capsule (300 mg total) by mouth 3 (three) times daily. For 2days for residual migraine 05/09/17 08/07/17  Scot Jun, FNP    Past Medical, Surgical Family and Social History reviewed and updated.    Objective:   Today's Vitals   08/28/17 1041  BP: (!) 144/90  Pulse: 60  Resp: 16  Temp: 98.2 F (36.8 C)  TempSrc: Oral  SpO2: 100%  Weight: (!) 329 lb (149.2 kg)  Height: _0  (1.626 m)    Wt Readings from Last 3 Encounters:  08/28/17 (!) 329 lb (149.2 kg)  05/09/17 (!) 336 lb (152.4 kg)  05/01/17 (!) 344 lb 12.8 oz (156.4 kg)  Physical Exam         Assessment & Plan:  1. Type 2 diabetes mellitus without complication, without long-term current use of insulin (HCC)-A1C 6.0 improved from prior 6.5. Continue modified carbohydrate diet and aim for at minimum 150 minutes of vigorous physical activity. Continue metformin as prescribed.   2. Menstrual abnormality, will check FSH, LH, Prolactin level to evaluate for any hormonal abnormalities. Pregnancy is negative. Obtaining a vaginal ultrasound to rule out the presence of any mass or lesion.  3. Screening, lipid, - Fasting Lipid panel  4. Essential hypertension, uncontrolled. Recheck BP decreased to 144/90. Goal BP <130/90. Will not change medication regimen today as patient is out of amlodipine.  I have advised patient to check BP regularly and to call us back or report to clinic if the numbers are consistently higher than 140/90. We discussed the importance of compliance with medical therapy and DASH diet recommended, consequences of uncontrolled hypertension discussed.    5. Memory Change, mini mental exam unremarkable with a score of 28/30. Negative of focal symptoms. Will continue to  monitor and advised patient if symptoms worsen or involve any other neurological symptoms, to return to clinic for further evaluation.  Meds ordered this encounter  Medications  . SUMAtriptan (IMITREX) 50 MG tablet    Sig: Take 1 tablet (50 mg total) by mouth as needed for migraine or headache. May repeat in 2 hours if headache persists or recurs. Do not take more than 2 a day    Dispense:  20 tablet    Refill:  6    Order Specific Question:   Supervising Provider    Answer:   Tresa Garter [id number]  . metFORMIN (GLUCOPHAGE-XR) 500 MG 24 hr tablet    Sig: Take 1 tablet (500 mg total) by mouth daily with breakfast.    Dispense:  90 tablet    Refill:  3    Order Specific Question:   Supervising Provider    Answer:   Tresa Garter [id number]  . amLODipine (NORVASC) 10 MG tablet    Sig: Take 1 tablet (10 mg total) by mouth daily.    Dispense:  90 tablet    Refill:  3    Order Specific Question:   Supervising Provider    Answer:   Tresa Garter [id number]    Orders Placed This Encounter  Procedures  . US Transvaginal Non-OB  . US PELVIS (TRANSABDOMINAL ONLY)  . FSH/LH  . Prolactin  . Lipid panel  . Basic metabolic panel  . POCT glycosylated hemoglobin (Hb A1C)  . POCT urine pregnancy      -The patient was given clear instructions to go to ER or return to medical center if symptoms do not improve, worsen or new problems develop. The patient verbalized understanding.    Carroll Sage. Kenton Kingfisher, MSN, FNP-C [facility]  [address] [place], [place] [address] [phone]

## 2017-08-28 NOTE — Telephone Encounter (Signed)
Schedule vaginal ultrasound at [facility] next available for evaluation of irregular bleeding.

## 2017-08-29 LAB — BASIC METABOLIC PANEL
BUN / CREAT RATIO: 12 (ref 9–23)
BUN: 8 mg/dL (ref 6–20)
CO2: 22 mmol/L (ref 20–29)
CREATININE: 0.67 mg/dL (ref 0.57–1.00)
Calcium: 9.2 mg/dL (ref 8.7–10.2)
Chloride: 103 mmol/L (ref 96–106)
GFR calc Af Amer: 134 mL/min/{1.73_m2} (ref >59)
GFR, EST NON AFRICAN AMERICAN: 116 mL/min/{1.73_m2} (ref >59)
GLUCOSE: 99 mg/dL (ref 65–99)
Potassium: 4.5 mmol/L (ref 3.5–5.2)
SODIUM: 141 mmol/L (ref 134–144)

## 2017-08-29 LAB — LIPID PANEL
CHOLESTEROL TOTAL: 178 mg/dL (ref 100–199)
Chol/HDL Ratio: 3.5 ratio (ref 0.0–4.4)
HDL: 51 mg/dL (ref >39)
LDL Calculated: 108 mg/dL — ABNORMAL HIGH (ref 0–99)
Triglycerides: 95 mg/dL (ref 0–149)
VLDL CHOLESTEROL CAL: 19 mg/dL (ref 5–40)

## 2017-08-29 LAB — FSH/LH
FSH: 4.8 m[IU]/mL
LH: 1.6 m[IU]/mL

## 2017-08-29 LAB — PROLACTIN: PROLACTIN: 10.4 ng/mL (ref 4.8–23.3)

## 2017-08-29 NOTE — Telephone Encounter (Signed)
Tried to call patient no vm set up. Appointment is 12/12 at 1045am

## 2017-08-30 ENCOUNTER — Encounter: Payer: Self-pay | Admitting: Family Medicine

## 2017-08-30 NOTE — Telephone Encounter (Signed)
Tried to contact patient again no answer 

## 2017-08-30 NOTE — Progress Notes (Signed)
Please mail.

## 2017-09-04 ENCOUNTER — Telehealth: Payer: Self-pay

## 2017-09-04 NOTE — Telephone Encounter (Signed)
Autumn [name],  Please handle PA for ultrasound scheduled for tomorrow. This may need to be rescheduled as our office has remained closed for the last two days.  Godfrey PickKimberly S. Tiburcio PeaHarris, MSN, FNP-C [facility]  [address] [place], [place] [address] [phone]

## 2017-09-05 ENCOUNTER — Ambulatory Visit (HOSPITAL_COMMUNITY): Payer: Medicaid Other | Attending: Family Medicine

## 2017-09-05 NOTE — Telephone Encounter (Signed)
-----   Message from Bing NeighborsKimberly S Harris, FNP sent at 09/04/2017  6:54 PM EST ----- Regarding: RE: Need PA Eber Jonesarolyn,  Please reschedule the patient as our office has been closed for the last 2 days and tomorrow we will not open until 10 am. This will likely not be completed prior to her 11:00 am appointment. This is a non-emergent study and can be rescheduled to allow time for pre-certification.   ----- Message ----- From: Vanetta ShawlIsley, Carolyn H, RT, RVT, RDMS Sent: 09/04/2017   3:26 PM To: Bing NeighborsKimberly S Harris, FNP Subject: Need PA                                        Selena BattenKim, We need the preauth before this patient arrives at 11:00 tomorrow.  I am not sure who carrie is, but can she lat me know once the preauth is obtained and I will not have the patient rescheduled.   Thank you. Fayne Norriearolyn Isley Radiology Ultrasound Manager [phone]

## 2017-09-05 NOTE — Telephone Encounter (Signed)
PA Completed -

## 2017-09-05 NOTE — Telephone Encounter (Signed)
Authorization completed and information was left for Melanie at [employer].

## 2017-09-28 ENCOUNTER — Ambulatory Visit: Payer: Self-pay | Admitting: Family Medicine

## 2017-11-20 ENCOUNTER — Ambulatory Visit: Payer: Medicaid Other | Admitting: Family Medicine

## 2018-07-12 ENCOUNTER — Encounter: Payer: Self-pay | Admitting: Family Medicine

## 2018-07-12 ENCOUNTER — Ambulatory Visit (INDEPENDENT_AMBULATORY_CARE_PROVIDER_SITE_OTHER): Payer: Medicaid Other | Admitting: Family Medicine

## 2018-07-12 VITALS — BP 140/96 | HR 94 | Temp 98.0°F | Ht 64.0 in | Wt 332.0 lb

## 2018-07-12 DIAGNOSIS — R0989 Other specified symptoms and signs involving the circulatory and respiratory systems: Secondary | ICD-10-CM | POA: Diagnosis not present

## 2018-07-12 DIAGNOSIS — I10 Essential (primary) hypertension: Secondary | ICD-10-CM

## 2018-07-12 DIAGNOSIS — R059 Cough, unspecified: Secondary | ICD-10-CM

## 2018-07-12 DIAGNOSIS — R05 Cough: Secondary | ICD-10-CM

## 2018-07-12 DIAGNOSIS — Z09 Encounter for follow-up examination after completed treatment for conditions other than malignant neoplasm: Secondary | ICD-10-CM | POA: Diagnosis not present

## 2018-07-12 LAB — POCT RAPID STREP A (OFFICE): Rapid Strep A Screen: NEGATIVE

## 2018-07-12 LAB — POCT INFLUENZA A/B
Influenza A, POC: NEGATIVE
Influenza B, POC: NEGATIVE

## 2018-07-12 MED ORDER — BENZONATATE 100 MG PO CAPS: 100.0000 mg | ORAL_CAPSULE | Freq: Two times a day (BID) | ORAL | 0 refills | Status: DC | PRN

## 2018-07-12 MED ORDER — AMOXICILLIN-POT CLAVULANATE 875-125 MG PO TABS: 1.0000 | ORAL_TABLET | Freq: Two times a day (BID) | ORAL | 0 refills | Status: AC

## 2018-07-12 NOTE — Patient Instructions (Signed)
Cough, Adult A cough helps to clear your throat and lungs. A cough may last only 2-3 weeks (acute), or it may last longer than 8 weeks (chronic). Many different things can cause a cough. A cough may be a sign of an illness or another medical condition. Follow these instructions at home:  Pay attention to any changes in your cough.  Take medicines only as told by your doctor. ? If you were prescribed an antibiotic medicine, take it as told by your doctor. Do not stop taking it even if you start to feel better. ? Talk with your doctor before you try using a cough medicine.  Drink enough fluid to keep your pee (urine) clear or pale yellow.  If the air is dry, use a cold steam vaporizer or humidifier in your home.  Stay away from things that make you cough at work or at home.  If your cough is worse at night, try using extra pillows to raise your head up higher while you sleep.  Do not smoke, and try not to be around smoke. If you need help quitting, ask your doctor.  Do not have caffeine.  Do not drink alcohol.  Rest as needed. Contact a doctor if:  You have new problems (symptoms).  You cough up yellow fluid (pus).  Your cough does not get better after 2-3 weeks, or your cough gets worse.  Medicine does not help your cough and you are not sleeping well.  You have pain that gets worse or pain that is not helped with medicine.  You have a fever.  You are losing weight and you do not know why.  You have night sweats. Get help right away if:  You cough up blood.  You have trouble breathing.  Your heartbeat is very fast. This information is not intended to replace advice given to you by your health care provider. Make sure you discuss any questions you have with your health care provider. Document Released: 05/25/2011 Document Revised: 02/17/2016 Document Reviewed: 11/18/2014 Elsevier Interactive Patient Education  2018 Elsevier Inc[employer] Benzonatate capsules What is this  medicine? BENZONATATE (ben ZOE na tate) is used to treat cough. This medicine may be used for other purposes; ask your health care provider or pharmacist if you have questions. COMMON BRAND NAME(S): Tessalon Perles, Zonatuss What should I tell my health care provider before I take this medicine? They need to know if you have any of these conditions: -kidney or liver disease -an unusual or allergic reaction to benzonatate, anesthetics, other medicines, foods, dyes, or preservatives -pregnant or trying to get pregnant -breast-feeding How should I use this medicine? Take this medicine by mouth with a glass of water. Follow the directions on the prescription label. Avoid breaking, chewing, or sucking the capsule, as this can cause serious side effects. Take your medicine at regular intervals. Do not take your medicine more often than directed. Talk to your pediatrician regarding the use of this medicine in children. While this drug may be prescribed for children as young as 15 years old for selected conditions, precautions do apply. Overdosage: If you think you have taken too much of this medicine contact a poison control center or emergency room at once. NOTE: This medicine is only for you. Do not share this medicine with others. What if I miss a dose? If you miss a dose, take it as soon as you can. If it is almost time for your next dose, take only that dose. Do not take  double or extra doses. What may interact with this medicine? Do not take this medicine with any of the following medications: -MAOIs like Carbex, Eldepryl, Marplan, Nardil, and Parnate This list may not describe all possible interactions. Give your health care provider a list of all the medicines, herbs, non-prescription drugs, or dietary supplements you use. Also tell them if you smoke, drink alcohol, or use illegal drugs. Some items may interact with your medicine. What should I watch for while using this medicine? Tell your  doctor if your symptoms do not improve or if they get worse. If you have a high fever, skin rash, or headache, see your health care professional. You may get drowsy or dizzy. Do not drive, use machinery, or do anything that needs mental alertness until you know how this medicine affects you. Do not sit or stand up quickly, especially if you are an older patient. This reduces the risk of dizzy or fainting spells. What side effects may I notice from receiving this medicine? Side effects that you should report to your doctor or health care professional as soon as possible: -allergic reactions like skin rash, itching or hives, swelling of the face, lips, or tongue -breathing problems -chest pain -confusion or hallucinations -irregular heartbeat -numbness of mouth or throat -seizures Side effects that usually do not require medical attention (report to your doctor or health care professional if they continue or are bothersome): -burning feeling in the eyes -constipation -headache -nasal congestion -stomach upset This list may not describe all possible side effects. Call your doctor for medical advice about side effects. You may report side effects to FDA at 1-800-FDA-1088. Where should I keep my medicine? Keep out of the reach of children. Store at room temperature between 15 and 30 degrees C (59 and 86 degrees F). Keep tightly closed. Protect from light and moisture. Throw away any unused medicine after the expiration date. NOTE: This sheet is a summary. It may not cover all possible information. If you have questions about this medicine, talk to your doctor, pharmacist, or health care provider.  2018 Elsevier/Gold Standard (2007-12-11 14:52:56) Amoxicillin; Clavulanic Acid tablets What is this medicine? AMOXICILLIN; CLAVULANIC ACID (a mox i SIL in; KLAV yoo lan ic AS id) is a penicillin antibiotic. It is used to treat certain kinds of bacterial infections. It will not work for colds, flu, or  other viral infections. This medicine may be used for other purposes; ask your health care provider or pharmacist if you have questions. COMMON BRAND NAME(S): Augmentin What should I tell my health care provider before I take this medicine? They need to know if you have any of these conditions: -bowel disease, like colitis -kidney disease -liver disease -mononucleosis -an unusual or allergic reaction to amoxicillin, penicillin, cephalosporin, other antibiotics, clavulanic acid, other medicines, foods, dyes, or preservatives -pregnant or trying to get pregnant -breast-feeding How should I use this medicine? Take this medicine by mouth with a full glass of water. Follow the directions on the prescription label. Take at the start of a meal. Do not crush or chew. If the tablet has a score line, you may cut it in half at the score line for easier swallowing. Take your medicine at regular intervals. Do not take your medicine more often than directed. Take all of your medicine as directed even if you think you are better. Do not skip doses or stop your medicine early. Talk to your pediatrician regarding the use of this medicine in children. Special care may  be needed. Overdosage: If you think you have taken too much of this medicine contact a poison control center or emergency room at once. NOTE: This medicine is only for you. Do not share this medicine with others. What if I miss a dose? If you miss a dose, take it as soon as you can. If it is almost time for your next dose, take only that dose. Do not take double or extra doses. What may interact with this medicine? -allopurinol -anticoagulants -birth control pills -methotrexate -probenecid This list may not describe all possible interactions. Give your health care provider a list of all the medicines, herbs, non-prescription drugs, or dietary supplements you use. Also tell them if you smoke, drink alcohol, or use illegal drugs. Some items may  interact with your medicine. What should I watch for while using this medicine? Tell your doctor or health care professional if your symptoms do not improve. Do not treat diarrhea with over the counter products. Contact your doctor if you have diarrhea that lasts more than 2 days or if it is severe and watery. If you have diabetes, you may get a false-positive result for sugar in your urine. Check with your doctor or health care professional. Birth control pills may not work properly while you are taking this medicine. Talk to your doctor about using an extra method of birth control. What side effects may I notice from receiving this medicine? Side effects that you should report to your doctor or health care professional as soon as possible: -allergic reactions like skin rash, itching or hives, swelling of the face, lips, or tongue -breathing problems -dark urine -fever or chills, sore throat -redness, blistering, peeling or loosening of the skin, including inside the mouth -seizures -trouble passing urine or change in the amount of urine -unusual bleeding, bruising -unusually weak or tired -white patches or sores in the mouth or throat Side effects that usually do not require medical attention (report to your doctor or health care professional if they continue or are bothersome): -diarrhea -dizziness -headache -nausea, vomiting -stomach upset -vaginal or anal irritation This list may not describe all possible side effects. Call your doctor for medical advice about side effects. You may report side effects to FDA at 1-800-FDA-1088. Where should I keep my medicine? Keep out of the reach of children. Store at room temperature below 25 degrees C (77 degrees F). Keep container tightly closed. Throw away any unused medicine after the expiration date. NOTE: This sheet is a summary. It may not cover all possible information. If you have questions about this medicine, talk to your doctor,  pharmacist, or health care provider.  2018 Elsevier/Gold Standard (2007-12-05 12:04:30)

## 2018-07-12 NOTE — Progress Notes (Signed)
Sick Visit  Subjective:    Patient ID: Autumn [name], female    DOB: 12/06/1985, 32 y.o.   MRN: [id number]   Chief Complaint  Patient presents with  . Cough    2 weeks  . Nasal Congestion  . Sore Throat  . Otalgia    HPI  Autumn [name] is a 32 year old female with a past medical history of Obesity, Hypertension, Heart Murmur, and Headaches. She is here today for a sick visit.    Focused Assessment: Cough/Congestion/Chills   She has had symptoms of cough, chest congestion, fatigue, body aches, and chills x 2 weeks. She has been taking OTC meds with no relief. She denies fevers, recent infections, weight loss, and night sweats. No chest pain, heart palpitations, and shortness of breath reported. No reports of GI problems such as nausea, vomiting, diarrhea, and constipation. She has no reports of blood in stools, dysuria and hematuria.No depression or anxiety reported.She denies pain today.   Review of Systems  HENT: Positive for congestion (chest/nasal congestion).   Respiratory: Positive for cough and shortness of breath (mild).        Chest congestion. Body Aches  Cardiovascular: Negative.   Gastrointestinal: Positive for abdominal distention (Obese).  Genitourinary: Negative.   Musculoskeletal: Negative.   Skin: Negative.   Neurological: Negative.   Psychiatric/Behavioral: Negative.    Objective:   Physical Exam  Constitutional: She is oriented to person, place, and time. She appears well-developed and well-nourished.  HENT:  Head: Normocephalic and atraumatic.  Cardiovascular: Normal rate, regular rhythm, normal heart sounds and intact distal pulses.  Pulmonary/Chest: Effort normal and breath sounds normal.  Abdominal: Soft. Bowel sounds are normal. She exhibits distension (Obese).  Musculoskeletal:  Reports muscle aches   Neurological: She is alert and oriented to person, place, and time.  Skin: Skin is warm and dry.  Psychiatric: She has a normal mood and  affect. Her behavior is normal. Judgment and thought content normal.  Nursing note and vitals reviewed.  Assessment & Plan:   1. Chest congestion She is negative for Strep and Influenza today.  - amoxicillin-clavulanate (AUGMENTIN) 875-125 MG tablet; Take 1 tablet by mouth 2 (two) times daily for 7 days.  Dispense: 14 tablet; Refill: 0 - Influenza A/B - Rapid Strep A  2. Cough Initiate Benzonatate today.  - benzonatate (TESSALON) 100 MG capsule; Take 1 capsule (100 mg total) by mouth 2 (two) times daily as needed for cough.  Dispense: 20 capsule; Refill: 0 - Influenza A/B - Rapid Strep A  3. Essential hypertension Blood pressure is 140/96 today. We will evaluate if she continues Norvasc as prescribed. She will continue to decrease high sodium intake, excessive alcohol intake, increase potassium intake, smoking cessation, and increase physical activity of at least 30 minutes of cardio activity daily. She will continue to follow Heart Healthy or DASH diet. Monitor.  4. Obesity Body mass index is 56.99 kg/m.  Goal BMI  is <30. Encouraged efforts to reduce weight include engaging in physical activity as tolerated with goal of 150 minutes per week. Improve dietary choices and eat a meal regimen consistent with a Mediterranean or DASH diet. Reduce simple carbohydrates. Do not skip meals and eat healthy snacks throughout the day to avoid over-eating at dinner. Set a goal weight loss that is achievable for you. We will re-visit at next office visit   5. Follow up We will schedule her for follow up of Hypertension in 2 weeks.  She will also follow  up in 3 months.   Meds ordered this encounter  Medications  . benzonatate (TESSALON) 100 MG capsule    Sig: Take 1 capsule (100 mg total) by mouth 2 (two) times daily as needed for cough.    Dispense:  20 capsule    Refill:  0  . amoxicillin-clavulanate (AUGMENTIN) 875-125 MG tablet    Sig: Take 1 tablet by mouth 2 (two) times daily for 7 days.     Dispense:  14 tablet    Refill:  0    Autumn Ip,  MSN, FNP-C Patient [facility] [address] [place], [place] [address] [phone]

## 2018-07-15 ENCOUNTER — Telehealth: Payer: Self-pay

## 2018-07-15 ENCOUNTER — Telehealth: Payer: Self-pay | Admitting: Family Medicine

## 2018-07-15 NOTE — Telephone Encounter (Signed)
Contacted patient today to assess how if symptoms have improved. Patient to be scheduled for follow up on chronic issues in 2 weeks. Message left on voicemail.Marland Kitchen

## 2018-07-16 ENCOUNTER — Telehealth: Payer: Self-pay

## 2018-07-16 NOTE — Telephone Encounter (Signed)
Called to verify appointment for 07/17/2018. NO answer left a message of appointment time and if unable to get to call back to reschedule. Thanks!  

## 2018-07-17 ENCOUNTER — Encounter: Payer: Self-pay | Admitting: Family Medicine

## 2018-07-17 ENCOUNTER — Ambulatory Visit (INDEPENDENT_AMBULATORY_CARE_PROVIDER_SITE_OTHER): Payer: Medicaid Other | Admitting: Family Medicine

## 2018-07-17 VITALS — BP 136/74 | HR 88 | Temp 98.0°F | Ht 64.0 in | Wt 332.0 lb

## 2018-07-17 DIAGNOSIS — G43919 Migraine, unspecified, intractable, without status migrainosus: Secondary | ICD-10-CM

## 2018-07-17 DIAGNOSIS — G629 Polyneuropathy, unspecified: Secondary | ICD-10-CM

## 2018-07-17 DIAGNOSIS — Z09 Encounter for follow-up examination after completed treatment for conditions other than malignant neoplasm: Secondary | ICD-10-CM

## 2018-07-17 DIAGNOSIS — R8290 Unspecified abnormal findings in urine: Secondary | ICD-10-CM

## 2018-07-17 DIAGNOSIS — E119 Type 2 diabetes mellitus without complications: Secondary | ICD-10-CM

## 2018-07-17 DIAGNOSIS — F419 Anxiety disorder, unspecified: Secondary | ICD-10-CM

## 2018-07-17 DIAGNOSIS — Z202 Contact with and (suspected) exposure to infections with a predominantly sexual mode of transmission: Secondary | ICD-10-CM

## 2018-07-17 DIAGNOSIS — Z7689 Persons encountering health services in other specified circumstances: Secondary | ICD-10-CM | POA: Diagnosis not present

## 2018-07-17 LAB — POCT URINALYSIS DIP (MANUAL ENTRY)
Bilirubin, UA: NEGATIVE
Glucose, UA: NEGATIVE mg/dL
Nitrite, UA: NEGATIVE
Spec Grav, UA: >=1.03 — AB (ref 1.010–1.025)
Urobilinogen, UA: 0.2 E.U./dL
pH, UA: 5.5 (ref 5.0–8.0)

## 2018-07-17 LAB — POCT GLYCOSYLATED HEMOGLOBIN (HGB A1C): Hemoglobin A1C: 5.9 % — AB (ref 4.0–5.6)

## 2018-07-17 MED ORDER — GABAPENTIN 100 MG PO CAPS: 100.0000 mg | ORAL_CAPSULE | Freq: Two times a day (BID) | ORAL | 3 refills | Status: DC

## 2018-07-17 MED ORDER — TOPIRAMATE 25 MG PO TABS: 25.0000 mg | ORAL_TABLET | Freq: Two times a day (BID) | ORAL | 2 refills | Status: DC

## 2018-07-17 MED ORDER — BUSPIRONE HCL 5 MG PO TABS: 5.0000 mg | ORAL_TABLET | Freq: Two times a day (BID) | ORAL | 2 refills | Status: DC

## 2018-07-17 NOTE — Patient Instructions (Addendum)
Buspirone tablets What is this medicine? BUSPIRONE (byoo SPYE rone) is used to treat anxiety disorders. This medicine may be used for other purposes; ask your health care provider or pharmacist if you have questions. COMMON BRAND NAME(S): BuSpar What should I tell my health care provider before I take this medicine? They need to know if you have any of these conditions: -kidney or liver disease -an unusual or allergic reaction to buspirone, other medicines, foods, dyes, or preservatives -pregnant or trying to get pregnant -breast-feeding How should I use this medicine? Take this medicine by mouth with a glass of water. Follow the directions on the prescription label. You may take this medicine with or without food. To ensure that this medicine always works the same way for you, you should take it either always with or always without food. Take your doses at regular intervals. Do not take your medicine more often than directed. Do not stop taking except on the advice of your doctor or health care professional. Talk to your pediatrician regarding the use of this medicine in children. Special care may be needed. Overdosage: If you think you have taken too much of this medicine contact a poison control center or emergency room at once. NOTE: This medicine is only for you. Do not share this medicine with others. What if I miss a dose? If you miss a dose, take it as soon as you can. If it is almost time for your next dose, take only that dose. Do not take double or extra doses. What may interact with this medicine? Do not take this medicine with any of the following medications: -linezolid -MAOIs like Carbex, Eldepryl, Marplan, Nardil, and Parnate -methylene blue -procarbazine This medicine may also interact with the following medications: -diazepam -digoxin -diltiazem -erythromycin -grapefruit juice -haloperidol -medicines for mental depression or mood problems -medicines for seizures like  carbamazepine, phenobarbital and phenytoin -nefazodone -other medications for anxiety -rifampin -ritonavir -some antifungal medicines like itraconazole, ketoconazole, and voriconazole -verapamil -warfarin This list may not describe all possible interactions. Give your health care provider a list of all the medicines, herbs, non-prescription drugs, or dietary supplements you use. Also tell them if you smoke, drink alcohol, or use illegal drugs. Some items may interact with your medicine. What should I watch for while using this medicine? Visit your doctor or health care professional for regular checks on your progress. It may take 1 to 2 weeks before your anxiety gets better. You may get drowsy or dizzy. Do not drive, use machinery, or do anything that needs mental alertness until you know how this drug affects you. Do not stand or sit up quickly, especially if you are an older patient. This reduces the risk of dizzy or fainting spells. Alcohol can make you more drowsy and dizzy. Avoid alcoholic drinks. What side effects may I notice from receiving this medicine? Side effects that you should report to your doctor or health care professional as soon as possible: -blurred vision or other vision changes -chest pain -confusion -difficulty breathing -feelings of hostility or anger -muscle aches and pains -numbness or tingling in hands or feet -ringing in the ears -skin rash and itching -vomiting -weakness Side effects that usually do not require medical attention (report to your doctor or health care professional if they continue or are bothersome): -disturbed dreams, nightmares -headache -nausea -restlessness or nervousness -sore throat and nasal congestion -stomach upset This list may not describe all possible side effects. Call your doctor for medical advice about side   effects. You may report side effects to FDA at 1-800-FDA-1088. Where should I keep my medicine? Keep out of the reach  of children. Store at room temperature below 30 degrees C (86 degrees F). Protect from light. Keep container tightly closed. Throw away any unused medicine after the expiration date. NOTE: This sheet is a summary. It may not cover all possible information. If you have questions about this medicine, talk to your doctor, pharmacist, or health care provider.  2018 Elsevier/Gold Standard (2010-04-21 18:06:11)        Topiramate tablets What is this medicine? TOPIRAMATE (toe PYRE a mate) is used to treat seizures in adults or children with epilepsy. It is also used for the prevention of migraine headaches. This medicine may be used for other purposes; ask your health care provider or pharmacist if you have questions. COMMON BRAND NAME(S): Topamax, Topiragen What should I tell my health care provider before I take this medicine? They need to know if you have any of these conditions: -bleeding disorders -cirrhosis of the liver or liver disease -diarrhea -glaucoma -kidney stones or kidney disease -low blood counts, like low white cell, platelet, or red cell counts -lung disease like asthma, obstructive pulmonary disease, emphysema -metabolic acidosis -on a ketogenic diet -schedule for surgery or a procedure -suicidal thoughts, plans, or attempt; a previous suicide attempt by you or a family member -an unusual or allergic reaction to topiramate, other medicines, foods, dyes, or preservatives -pregnant or trying to get pregnant -breast-feeding How should I use this medicine? Take this medicine by mouth with a glass of water. Follow the directions on the prescription label. Do not crush or chew. You may take this medicine with meals. Take your medicine at regular intervals. Do not take it more often than directed. Talk to your pediatrician regarding the use of this medicine in children. Special care may be needed. While this drug may be prescribed for children as young as 42 years of age for  selected conditions, precautions do apply. Overdosage: If you think you have taken too much of this medicine contact a poison control center or emergency room at once. NOTE: This medicine is only for you. Do not share this medicine with others. What if I miss a dose? If you miss a dose, take it as soon as you can. If your next dose is to be taken in less than 6 hours, then do not take the missed dose. Take the next dose at your regular time. Do not take double or extra doses. What may interact with this medicine? Do not take this medicine with any of the following medications: -probenecid This medicine may also interact with the following medications: -acetazolamide -alcohol -amitriptyline -aspirin and aspirin-like medicines -birth control pills -certain medicines for depression -certain medicines for seizures -certain medicines that treat or prevent blood clots like warfarin, enoxaparin, dalteparin, apixaban, dabigatran, and rivaroxaban -digoxin -hydrochlorothiazide -lithium -medicines for pain, sleep, or muscle relaxation -metformin -methazolamide -NSAIDS, medicines for pain and inflammation, like ibuprofen or naproxen -pioglitazone -risperidone This list may not describe all possible interactions. Give your health care provider a list of all the medicines, herbs, non-prescription drugs, or dietary supplements you use. Also tell them if you smoke, drink alcohol, or use illegal drugs. Some items may interact with your medicine. What should I watch for while using this medicine? Visit your doctor or health care professional for regular checks on your progress. Do not stop taking this medicine suddenly. This increases the risk of seizures if  you are using this medicine to control epilepsy. Wear a medical identification bracelet or chain to say you have epilepsy or seizures, and carry a card that lists all your medicines. This medicine can decrease sweating and increase your body  temperature. Watch for signs of deceased sweating or fever, especially in children. Avoid extreme heat, hot baths, and saunas. Be careful about exercising, especially in hot weather. Contact your health care provider right away if you notice a fever or decrease in sweating. You should drink plenty of fluids while taking this medicine. If you have had kidney stones in the past, this will help to reduce your chances of forming kidney stones. If you have stomach pain, with nausea or vomiting and yellowing of your eyes or skin, call your doctor immediately. You may get drowsy, dizzy, or have blurred vision. Do not drive, use machinery, or do anything that needs mental alertness until you know how this medicine affects you. To reduce dizziness, do not sit or stand up quickly, especially if you are an older patient. Alcohol can increase drowsiness and dizziness. Avoid alcoholic drinks. If you notice blurred vision, eye pain, or other eye problems, seek medical attention at once for an eye exam. The use of this medicine may increase the chance of suicidal thoughts or actions. Pay special attention to how you are responding while on this medicine. Any worsening of mood, or thoughts of suicide or dying should be reported to your health care professional right away. This medicine may increase the chance of developing metabolic acidosis. If left untreated, this can cause kidney stones, bone disease, or slowed growth in children. Symptoms include breathing fast, fatigue, loss of appetite, irregular heartbeat, or loss of consciousness. Call your doctor immediately if you experience any of these side effects. Also, tell your doctor about any surgery you plan on having while taking this medicine since this may increase your risk for metabolic acidosis. Birth control pills may not work properly while you are taking this medicine. Talk to your doctor about using an extra method of birth control. Women who become pregnant  while using this medicine may enroll in the [place] [employer] Antiepileptic Drug Pregnancy Registry by calling [phone]. This registry collects information about the safety of antiepileptic drug use during pregnancy. What side effects may I notice from receiving this medicine? Side effects that you should report to your doctor or health care professional as soon as possible: -allergic reactions like skin rash, itching or hives, swelling of the face, lips, or tongue -decreased sweating and/or rise in body temperature -depression -difficulty breathing, fast or irregular breathing patterns -difficulty speaking -difficulty walking or controlling muscle movements -hearing impairment -redness, blistering, peeling or loosening of the skin, including inside the mouth -tingling, pain or numbness in the hands or feet -unusual bleeding or bruising -unusually weak or tired -worsening of mood, thoughts or actions of suicide or dying Side effects that usually do not require medical attention (report to your doctor or health care professional if they continue or are bothersome): -altered taste -back pain, joint or muscle aches and pains -diarrhea, or constipation -headache -loss of appetite -nausea -stomach upset, indigestion -tremors This list may not describe all possible side effects. Call your doctor for medical advice about side effects. You may report side effects to FDA at 1-800-FDA-1088. Where should I keep my medicine? Keep out of the reach of children. Store at room temperature between 15 and 30 degrees C (59 and 86 degrees F) in a tightly closed  container. Protect from moisture. Throw away any unused medicine after the expiration date. NOTE: This sheet is a summary. It may not cover all possible information. If you have questions about this medicine, talk to your doctor, pharmacist, or health care provider.  2018 Elsevier/Gold Standard (2013-09-15 23:17:57)   Gabapentin capsules or  tablets What is this medicine? GABAPENTIN (GA ba pen tin) is used to control partial seizures in adults with epilepsy. It is also used to treat certain types of nerve pain. This medicine may be used for other purposes; ask your health care provider or pharmacist if you have questions. COMMON BRAND NAME(S): Active-PAC with Gabapentin, Gabarone, Neurontin What should I tell my health care provider before I take this medicine? They need to know if you have any of these conditions: -kidney disease -suicidal thoughts, plans, or attempt; a previous suicide attempt by you or a family member -an unusual or allergic reaction to gabapentin, other medicines, foods, dyes, or preservatives -pregnant or trying to get pregnant -breast-feeding How should I use this medicine? Take this medicine by mouth with a glass of water. Follow the directions on the prescription label. You can take it with or without food. If it upsets your stomach, take it with food.Take your medicine at regular intervals. Do not take it more often than directed. Do not stop taking except on your doctor's advice. If you are directed to break the 600 or 800 mg tablets in half as part of your dose, the extra half tablet should be used for the next dose. If you have not used the extra half tablet within 28 days, it should be thrown away. A special MedGuide will be given to you by the pharmacist with each prescription and refill. Be sure to read this information carefully each time. Talk to your pediatrician regarding the use of this medicine in children. Special care may be needed. Overdosage: If you think you have taken too much of this medicine contact a poison control center or emergency room at once. NOTE: This medicine is only for you. Do not share this medicine with others. What if I miss a dose? If you miss a dose, take it as soon as you can. If it is almost time for your next dose, take only that dose. Do not take double or extra  doses. What may interact with this medicine? Do not take this medicine with any of the following medications: -other gabapentin products This medicine may also interact with the following medications: -alcohol -antacids -antihistamines for allergy, cough and cold -certain medicines for anxiety or sleep -certain medicines for depression or psychotic disturbances -homatropine; hydrocodone -naproxen -narcotic medicines (opiates) for pain -phenothiazines like chlorpromazine, mesoridazine, prochlorperazine, thioridazine This list may not describe all possible interactions. Give your health care provider a list of all the medicines, herbs, non-prescription drugs, or dietary supplements you use. Also tell them if you smoke, drink alcohol, or use illegal drugs. Some items may interact with your medicine. What should I watch for while using this medicine? Visit your doctor or health care professional for regular checks on your progress. You may want to keep a record at home of how you feel your condition is responding to treatment. You may want to share this information with your doctor or health care professional at each visit. You should contact your doctor or health care professional if your seizures get worse or if you have any new types of seizures. Do not stop taking this medicine or any of your  seizure medicines unless instructed by your doctor or health care professional. Stopping your medicine suddenly can increase your seizures or their severity. Wear a medical identification bracelet or chain if you are taking this medicine for seizures, and carry a card that lists all your medications. You may get drowsy, dizzy, or have blurred vision. Do not drive, use machinery, or do anything that needs mental alertness until you know how this medicine affects you. To reduce dizzy or fainting spells, do not sit or stand up quickly, especially if you are an older patient. Alcohol can increase drowsiness and  dizziness. Avoid alcoholic drinks. Your mouth may get dry. Chewing sugarless gum or sucking hard candy, and drinking plenty of water will help. The use of this medicine may increase the chance of suicidal thoughts or actions. Pay special attention to how you are responding while on this medicine. Any worsening of mood, or thoughts of suicide or dying should be reported to your health care professional right away. Women who become pregnant while using this medicine may enroll in the [place] Antiepileptic Drug Pregnancy Registry by calling [phone]. This registry collects information about the safety of antiepileptic drug use during pregnancy. What side effects may I notice from receiving this medicine? Side effects that you should report to your doctor or health care professional as soon as possible: -allergic reactions like skin rash, itching or hives, swelling of the face, lips, or tongue -worsening of mood, thoughts or actions of suicide or dying Side effects that usually do not require medical attention (report to your doctor or health care professional if they continue or are bothersome): -constipation -difficulty walking or controlling muscle movements -dizziness -nausea -slurred speech -tiredness -tremors -weight gain This list may not describe all possible side effects. Call your doctor for medical advice about side effects. You may report side effects to FDA at 1-800-FDA-1088. Where should I keep my medicine? Keep out of reach of children. This medicine may cause accidental overdose and death if it taken by other adults, children, or pets. Mix any unused medicine with a substance like cat litter or coffee grounds. Then throw the medicine away in a sealed container like a sealed bag or a coffee can with a lid. Do not use the medicine after the expiration date. Store at room temperature between 15 and 30 degrees C (59 and 86 degrees F). NOTE: This sheet is a summary. It may  not cover all possible information. If you have questions about this medicine, talk to your doctor, pharmacist, or health care provider.  2018 Elsevier/Gold Standard (2013-11-07 15:26:50)

## 2018-07-17 NOTE — Progress Notes (Signed)
Follow Up  Subjective:    Patient ID: Autumn [name], female    DOB: Jun 29, 1986, 32 y.o.   MRN: [id number]   Chief Complaint  Patient presents with  . Headache  . Leg Pain    both leg   . heel pain    left foot     HPI  Autumn [name] is a 32 year old female with a past medical history of Obesity, Leaky Heart Valve, Hypertension, Heart Murmur, and Headache. She is here today for follow up and assessment of chronic diseases.    Current Status: Since her last office visit, she has been having increased headaches, with occasional dizziness and shortness of breath. She has been having increased family and situational anxiety. No depression or anxiety reported. She denies visual changes, chest pain, cough, shortness of breath, heart palpitations, and falls. She has occasionally headaches and dizziness with position changes. Denies severe headaches, confusion, seizures, double vision, and blurred vision, nausea and vomiting.  She denies fevers, chills, fatigue, recent infections, weight loss, and night sweats. No reports of GI problems such as diarrhea, and constipation. She has no reports of blood in stools, dysuria and hematuria.  She denies pain today.    Review of Systems  Constitutional: Negative.   HENT: Negative.   Eyes: Positive for visual disturbance (Blurry).  Respiratory: Positive for shortness of breath (Occasionally).   Cardiovascular: Negative.   Gastrointestinal: Positive for abdominal distention (obese).  Genitourinary: Negative.   Musculoskeletal: Negative.   Skin: Negative.   Neurological: Positive for dizziness (Occassional) and headaches (occassional).  Psychiatric/Behavioral: Negative.    Objective:   Physical Exam  Constitutional: She appears well-developed and well-nourished.  Cardiovascular: Normal rate, regular rhythm, normal heart sounds and intact distal pulses.  Pulmonary/Chest: Effort normal and breath sounds normal.  Abdominal: Soft. Bowel sounds  are normal. She exhibits distension.  Musculoskeletal: Normal range of motion.  Skin: Skin is warm and dry.  Nursing note and vitals reviewed.  Assessment & Plan:   1. Type 2 diabetes mellitus without complication, without long-term current use of insulin (HCC) Hgb A1c is stable at 5.9 today. She will continue Metformin as prescribed. She will continue to decrease foods/beverages high in sugars and carbs and follow Heart Healthy or DASH diet. Increase physical activity to at least 30 minutes cardio exercise daily.  - POCT glycosylated hemoglobin (Hb A1C) - POCT urinalysis dipstick  2. Anxiety Increased. We will initiate Buspar ad refer her to psychiatry today.  - Ambulatory referral to Psychiatry  3. Intractable migraine without status migrainosus, unspecified migraine type We will initiate Topamax today.  - topiramate (TOPAMAX) 25 MG tablet; Take 1 tablet (25 mg total) by mouth 2 (two) times daily.  Dispense: 60 tablet; Refill: 2  4. Abnormal urinalysis  5. Neuropathy We will initiate Gabapentin to help to relieve neuropathy symptoms. - gabapentin (NEURONTIN) 100 MG capsule; Take 1 capsule (100 mg total) by mouth 2 (two) times daily.  Dispense: 60 capsule; Refill: 3  6. Encounter for assessment of STD exposure Swab results are pending. - NuSwab Vaginitis Plus (VG+)  7. Abnormal urine - Urine Culture  8. Follow up She will keep previously scheduled follow up appointment in 10/14/2018  Meds ordered this encounter  Medications  . topiramate (TOPAMAX) 25 MG tablet    Sig: Take 1 tablet (25 mg total) by mouth 2 (two) times daily.    Dispense:  60 tablet    Refill:  2  . gabapentin (NEURONTIN) 100 MG capsule  Sig: Take 1 capsule (100 mg total) by mouth 2 (two) times daily.    Dispense:  60 capsule    Refill:  3  . busPIRone (BUSPAR) 5 MG tablet    Sig: Take 1 tablet (5 mg total) by mouth 2 (two) times daily.    Dispense:  60 tablet    Refill:  2   Raliegh Ip,  MSN,  FNP-C [facility] [address] [place], [place] [address] [phone]

## 2018-07-19 LAB — URINE CULTURE

## 2018-07-20 LAB — NUSWAB VAGINITIS PLUS (VG+)
Candida albicans, NAA: POSITIVE — AB
Candida glabrata, NAA: POSITIVE — AB
Chlamydia trachomatis, NAA: NEGATIVE
Neisseria gonorrhoeae, NAA: NEGATIVE
Trich vag by NAA: POSITIVE — AB

## 2018-07-29 ENCOUNTER — Other Ambulatory Visit: Payer: Self-pay | Admitting: Family Medicine

## 2018-07-29 DIAGNOSIS — A599 Trichomoniasis, unspecified: Secondary | ICD-10-CM

## 2018-07-29 DIAGNOSIS — B379 Candidiasis, unspecified: Secondary | ICD-10-CM

## 2018-07-29 MED ORDER — METRONIDAZOLE 500 MG PO TABS: ORAL_TABLET | ORAL | 0 refills | Status: DC

## 2018-07-29 MED ORDER — FLUCONAZOLE 150 MG PO TABS: 150.0000 mg | ORAL_TABLET | Freq: Once | ORAL | 0 refills | Status: AC

## 2018-07-29 NOTE — Progress Notes (Signed)
Rx for Flagyl and Diflucan to pharmacy today.

## 2018-07-30 ENCOUNTER — Ambulatory Visit: Payer: Medicaid Other | Admitting: Family Medicine

## 2018-07-30 NOTE — Telephone Encounter (Signed)
-----   Message from Natalie M Stroud, FNP sent at 07/29/2018 11:04 PM EST ----- Regarding: "Lab Results" Carrie,   Please inform patient that we have sent Rxs for Metronidazole and Diflucan sent to pharmacy today. Swab revealed Bacteria (Trich), and Candidias. She is to take medication as directed. She is to complete all medication. Most importantly, avoid Alcohol while taking Flagyl. Contact office if symptoms do not improve or worsen. Keep follow up appointment.   Thank you.    

## 2018-07-30 NOTE — Telephone Encounter (Signed)
Left a vm for patient to callback 

## 2018-07-31 NOTE — Telephone Encounter (Signed)
Patient notified of result and will get medication

## 2018-07-31 NOTE — Telephone Encounter (Signed)
-----   Message from Kallie Locks, FNP sent at 07/29/2018 11:04 PM EST ----- Regarding: "Lab Results" Lyla Son,   Please inform patient that we have sent Rxs for Metronidazole and Diflucan sent to pharmacy today. Swab revealed Bacteria (Trich), and Candidias. She is to take medication as directed. She is to complete all medication. Most importantly, avoid Alcohol while taking Flagyl. Contact office if symptoms do not improve or worsen. Keep follow up appointment.   Thank you.

## 2018-07-31 NOTE — Telephone Encounter (Signed)
Letter will be sent out as I have been unable to reach patient by phone.

## 2018-08-07 ENCOUNTER — Ambulatory Visit: Payer: Medicaid Other

## 2018-08-07 VITALS — BP 138/84

## 2018-08-07 DIAGNOSIS — I10 Essential (primary) hypertension: Secondary | ICD-10-CM

## 2018-09-02 ENCOUNTER — Encounter: Payer: Self-pay | Admitting: Family Medicine

## 2018-09-02 ENCOUNTER — Ambulatory Visit (INDEPENDENT_AMBULATORY_CARE_PROVIDER_SITE_OTHER): Payer: Medicaid Other | Admitting: Family Medicine

## 2018-09-02 VITALS — BP 132/80 | HR 78 | Temp 97.9°F | Ht 64.0 in | Wt 327.0 lb

## 2018-09-02 DIAGNOSIS — G629 Polyneuropathy, unspecified: Secondary | ICD-10-CM | POA: Diagnosis not present

## 2018-09-02 DIAGNOSIS — I10 Essential (primary) hypertension: Secondary | ICD-10-CM

## 2018-09-02 DIAGNOSIS — F419 Anxiety disorder, unspecified: Secondary | ICD-10-CM

## 2018-09-02 DIAGNOSIS — Z09 Encounter for follow-up examination after completed treatment for conditions other than malignant neoplasm: Secondary | ICD-10-CM

## 2018-09-02 LAB — POCT URINALYSIS DIP (MANUAL ENTRY)
Bilirubin, UA: NEGATIVE
Blood, UA: NEGATIVE
Glucose, UA: NEGATIVE mg/dL
Ketones, POC UA: NEGATIVE mg/dL
Leukocytes, UA: NEGATIVE
Nitrite, UA: NEGATIVE
Protein Ur, POC: NEGATIVE mg/dL
Spec Grav, UA: 1.02 (ref 1.010–1.025)
Urobilinogen, UA: 0.2 E.U./dL
pH, UA: 5.5 (ref 5.0–8.0)

## 2018-09-02 MED ORDER — CLONIDINE HCL 0.1 MG PO TABS
0.1000 mg | ORAL_TABLET | Freq: Once | ORAL | Status: AC
  Administered 2018-09-02: 0.1 mg via ORAL

## 2018-09-02 MED ORDER — AMLODIPINE BESYLATE 10 MG PO TABS: 10.0000 mg | ORAL_TABLET | Freq: Every day | ORAL | 2 refills | Status: DC

## 2018-09-02 MED ORDER — HYDROCHLOROTHIAZIDE 25 MG PO TABS: 25.0000 mg | ORAL_TABLET | Freq: Every day | ORAL | 2 refills | Status: DC

## 2018-09-02 MED ORDER — BISOPROLOL FUMARATE 5 MG PO TABS: 5.0000 mg | ORAL_TABLET | Freq: Every day | ORAL | 2 refills | Status: DC

## 2018-09-02 MED ORDER — LOSARTAN POTASSIUM 25 MG PO TABS: 25.0000 mg | ORAL_TABLET | Freq: Every day | ORAL | 2 refills | Status: DC

## 2018-09-02 MED ORDER — GABAPENTIN 100 MG PO CAPS: ORAL_CAPSULE | ORAL | 2 refills | Status: DC

## 2018-09-02 NOTE — Progress Notes (Signed)
or

## 2018-09-02 NOTE — Progress Notes (Signed)
Follow Up  Subjective:    Patient ID: Autumn [name], female    DOB: 1986-05-29, 32 y.o.   MRN: [id number]  Chief Complaint  Patient presents with  . Follow-up    chronic condition    HPI [name] is a 32 year old female with a past medical history of Obesity, Hypertension, Hearth Murmur, and Headaches. She is here today for assessment and follow up of chronic diseases.   Current Status: Since her last office visit, she is doing well with no complaints. She is currently not taking blood pressure medications as prescribed. She reports occasional chest pain, cough, shortness of breath, heart palpitations, and falls. She has occasionally headaches, blurry vision, and dizziness with position changes. Denies severe headaches, confusion, seizures, nausea and vomiting.  She denies fevers, chills, fatigue, recent infections, weight loss, and night sweats. She has not had any and falls. No reports of GI problems such as nausea, vomiting, diarrhea, and constipation. She has no reports of blood in stools, dysuria and hematuria. No depression or anxiety reported. She denies pain today.   Review of Systems  Constitutional: Negative.   HENT: Negative.   Eyes: Positive for visual disturbance (Occasional blurry vision).  Respiratory: Positive for cough and shortness of breath.   Cardiovascular: Positive for chest pain (Occasional ).  Gastrointestinal: Positive for abdominal distention (Obese).  Endocrine: Negative.   Genitourinary: Negative.   Musculoskeletal: Negative.   Skin: Negative.   Allergic/Immunologic: Negative.   Neurological: Positive for dizziness (Occasional ) and headaches (Occasional).  Hematological: Negative.   Psychiatric/Behavioral: Negative.    Objective:   Physical Exam  Constitutional: She is oriented to person, place, and time. She appears well-developed and well-nourished.  HENT:  Head: Normocephalic and atraumatic.  Eyes: Pupils are equal, round, and reactive  to light. Conjunctivae and EOM are normal.  Neck: Normal range of motion. Neck supple.  Cardiovascular: Normal rate, regular rhythm, normal heart sounds and intact distal pulses.  Pulmonary/Chest: Effort normal and breath sounds normal.  Abdominal: Soft. Bowel sounds are normal.  Musculoskeletal: Normal range of motion.  Neurological: She is alert and oriented to person, place, and time.  Skin: Skin is warm and dry.  Psychiatric: She has a normal mood and affect. Her behavior is normal. Judgment and thought content normal.  Nursing note and vitals reviewed.  Assessment & Plan:   1. Essential hypertension Blood pressure is elevated today. We will increase HCTZ to 25 mg. Clonidine given today in office effective to decreasing blood pressure today. She will begin to take Amlodipine, Bisoprolol, and HCTZ as prescribe. She will continue to decrease high sodium intake, excessive alcohol intake, increase potassium intake, smoking cessation, and increase physical activity of at least 30 minutes of cardio activity daily. She will continue to follow Heart Healthy or DASH diet. - POCT urinalysis dipstick - cloNIDine (CATAPRES) tablet 0.1 mg - hydrochlorothiazide (HYDRODIURIL) 25 MG tablet; Take 1 tablet (25 mg total) by mouth daily.  Dispense: 30 tablet; Refill: 2 - cloNIDine (CATAPRES) tablet 0.1 mg  2. Neuropathy We will increase Gabapentin to 200 mg BID today. Monitor.  - gabapentin (NEURONTIN) 100 MG capsule; Take 2 capsule (200 mg total) twice daily.  Dispense: 120 capsule; Refill: 2  3. Anxiety Stable. She will continue Buspar as prescribed.   4. Follow up She will follow up in 1 month.  Meds ordered this encounter  Medications  . cloNIDine (CATAPRES) tablet 0.1 mg  . gabapentin (NEURONTIN) 100 MG capsule    Sig:  Take 2 capsule (200 mg total) twice daily.    Dispense:  120 capsule    Refill:  2  . hydrochlorothiazide (HYDRODIURIL) 25 MG tablet    Sig: Take 1 tablet (25 mg total) by  mouth daily.    Dispense:  30 tablet    Refill:  2  . amLODipine (NORVASC) 10 MG tablet    Sig: Take 1 tablet (10 mg total) by mouth daily.    Dispense:  30 tablet    Refill:  2  . bisoprolol (ZEBETA) 5 MG tablet    Sig: Take 1 tablet (5 mg total) by mouth daily.    Dispense:  30 tablet    Refill:  2  . losartan (COZAAR) 25 MG tablet    Sig: Take 1 tablet (25 mg total) by mouth daily.    Dispense:  30 tablet    Refill:  2  . cloNIDine (CATAPRES) tablet 0.1 mg    Raliegh IpNatalie Lafe Clerk,  MSN, FNP-C [facility] [address]  [place], [place] [id number] [phone]

## 2018-09-03 ENCOUNTER — Ambulatory Visit: Payer: Medicaid Other | Admitting: Family Medicine

## 2018-09-30 ENCOUNTER — Ambulatory Visit: Payer: Medicaid Other | Admitting: Family Medicine

## 2018-10-14 ENCOUNTER — Ambulatory Visit: Payer: Medicaid Other | Admitting: Family Medicine

## 2018-11-09 ENCOUNTER — Emergency Department (HOSPITAL_BASED_OUTPATIENT_CLINIC_OR_DEPARTMENT_OTHER): Payer: Medicaid Other

## 2018-11-09 ENCOUNTER — Emergency Department (HOSPITAL_BASED_OUTPATIENT_CLINIC_OR_DEPARTMENT_OTHER)
Admission: EM | Admit: 2018-11-09 | Discharge: 2018-11-09 | Disposition: A | Discharge status: Home / Self Care | Payer: Medicaid Other | Attending: Emergency Medicine | Admitting: Emergency Medicine

## 2018-11-09 ENCOUNTER — Other Ambulatory Visit: Payer: Self-pay

## 2018-11-09 ENCOUNTER — Encounter (HOSPITAL_BASED_OUTPATIENT_CLINIC_OR_DEPARTMENT_OTHER): Payer: Self-pay | Admitting: Adult Health

## 2018-11-09 DIAGNOSIS — R1030 Lower abdominal pain, unspecified: Secondary | ICD-10-CM | POA: Diagnosis not present

## 2018-11-09 DIAGNOSIS — R319 Hematuria, unspecified: Secondary | ICD-10-CM | POA: Insufficient documentation

## 2018-11-09 DIAGNOSIS — N888 Other specified noninflammatory disorders of cervix uteri: Secondary | ICD-10-CM | POA: Diagnosis not present

## 2018-11-09 DIAGNOSIS — I10 Essential (primary) hypertension: Secondary | ICD-10-CM | POA: Diagnosis not present

## 2018-11-09 DIAGNOSIS — B9689 Other specified bacterial agents as the cause of diseases classified elsewhere: Secondary | ICD-10-CM

## 2018-11-09 DIAGNOSIS — R1033 Periumbilical pain: Secondary | ICD-10-CM | POA: Diagnosis not present

## 2018-11-09 DIAGNOSIS — E119 Type 2 diabetes mellitus without complications: Secondary | ICD-10-CM | POA: Diagnosis not present

## 2018-11-09 DIAGNOSIS — N760 Acute vaginitis: Secondary | ICD-10-CM | POA: Diagnosis not present

## 2018-11-09 DIAGNOSIS — K760 Fatty (change of) liver, not elsewhere classified: Secondary | ICD-10-CM | POA: Diagnosis not present

## 2018-11-09 LAB — URINALYSIS, ROUTINE W REFLEX MICROSCOPIC
Bilirubin Urine: NEGATIVE
Glucose, UA: NEGATIVE mg/dL
Ketones, ur: NEGATIVE mg/dL
NITRITE: NEGATIVE
PROTEIN: 30 mg/dL — AB
Specific Gravity, Urine: 1.02 (ref 1.005–1.030)
pH: 6.5 (ref 5.0–8.0)

## 2018-11-09 LAB — URINALYSIS, MICROSCOPIC (REFLEX)
RBC / HPF: >50 RBC/hpf (ref 0–5)
WBC, UA: >50 WBC/hpf (ref 0–5)

## 2018-11-09 LAB — PREGNANCY, URINE: Preg Test, Ur: NEGATIVE

## 2018-11-09 LAB — WET PREP, GENITAL
Sperm: NONE SEEN
TRICH WET PREP: NONE SEEN
Yeast Wet Prep HPF POC: NONE SEEN

## 2018-11-09 MED ORDER — METRONIDAZOLE 500 MG PO TABS: 500.0000 mg | ORAL_TABLET | Freq: Two times a day (BID) | ORAL | 0 refills | Status: AC

## 2018-11-09 MED ORDER — IBUPROFEN 800 MG PO TABS: 800.0000 mg | ORAL_TABLET | Freq: Three times a day (TID) | ORAL | 0 refills | Status: DC | PRN

## 2018-11-09 MED ORDER — FLUCONAZOLE 200 MG PO TABS: 200.0000 mg | ORAL_TABLET | Freq: Every day | ORAL | 0 refills | Status: AC | PRN

## 2018-11-09 MED ORDER — ACETAMINOPHEN 500 MG PO TABS
1000.0000 mg | ORAL_TABLET | Freq: Once | ORAL | Status: AC
  Administered 2018-11-09: 1000 mg via ORAL
  Filled 2018-11-09: qty 2

## 2018-11-09 NOTE — ED Notes (Signed)
ED Provider at bedside. 

## 2018-11-09 NOTE — ED Triage Notes (Signed)
Presents with lower abdominal pain and pressure that feels like a strong pressure. It began last night after intercourse. She denies any new sexual activity, no toys, it was not rough or different than usual, no new lubricant. She reports that the pressure is after urinating at the end and there is also a strong pain in her lower abdomen described as cramping that began last night as well. She had a tubal ligation and had a recent period. Denies pregnancy.

## 2018-11-09 NOTE — Discharge Instructions (Signed)
You have been seen in the Emergency Department (ED) for abdominal pain.  Your evaluation did not identify a clear cause of your symptoms but was generally reassuring.  Please follow up as instructed above regarding todays emergent visit and the symptoms that are bothering you.  Return to the ED if your abdominal pain worsens or fails to improve, you develop bloody vomiting, bloody diarrhea, you are unable to tolerate fluids due to vomiting, fever greater than 101, or other symptoms that concern you.   Procedures

## 2018-11-09 NOTE — ED Provider Notes (Signed)
Emergency Department Provider Note   I have reviewed the triage vital signs and the nursing notes.   HISTORY  Chief Complaint Abdominal Pain   HPI Autumn [name] is a 33 y.o. female patient presents to the emergency department with lower abdominal pressure/pain worse with urination.  Symptoms began last night.  No radiation of symptoms to the flank or back.  Patient denies any vaginal bleeding or discharge.  No pain during sex.  No fevers or shaking chills.  She denies any urinary frequency but states that she already goes often because of fluid pills.  Pain is worse at the end of urination and feels like a pressure.   Past Medical History:  Diagnosis Date  . Essential hypertension   . H/O varicella   . Headache   . Heart murmur   . Hypertension    stopped meds since pregnant  . Leaky heart valve    mild MR by echo  . Obese     Patient Active Problem List   Diagnosis Date Noted  . Type 2 diabetes mellitus without complication (HCC) 05/03/2017  . Complicated migraine 05/02/2017  . Hypertensive urgency 05/02/2017  . Normocytic anemia 05/02/2017  . Left-sided weakness 05/02/2017  . Essential hypertension   . Myofascial pain 01/13/2016  . Weakness 01/13/2016  . Pain in limb 01/13/2016  . Chest pain at rest 12/14/2015  . Preeclampsia complicating hypertension 11/29/2013  . Chronic hypertension with superimposed preeclampsia 11/28/2013  . Positive GBS test 11/28/2013  . Chronic headaches 11/28/2013  . Leaky heart valve 05/12/2012  . Morbid obesity (HCC) 01/22/2012    Past Surgical History:  Procedure Laterality Date  . BILATERAL SALPINGECTOMY Bilateral 11/28/2013   Procedure: BILATERAL SALPINGECTOMY;  Surgeon: Michael Litter, MD;  Location: [facility];  Service: Obstetrics;  Laterality: Bilateral;  Partial on Left  . CESAREAN SECTION  05/14/2012   Procedure: CESAREAN SECTION;  Surgeon: Hal Morales, MD;  Location: [facility];  Service: Gynecology;  Laterality:  N/A;  Primary cesarean section of baby girl    at 0533 APGAR 8/9  . CESAREAN SECTION N/A 11/28/2013   Procedure: CESAREAN SECTION;  Surgeon: Michael Litter, MD;  Location: [facility];  Service: Obstetrics;  Laterality: N/A;  . CESAREAN SECTION      Allergies Patient has no known allergies.  Family History  Problem Relation Age of Onset  . Hypertension Mother   . Diabetes Mellitus II Mother   . CAD Father   . Hyperlipidemia Father   . Hypertension Sister   . Diabetes Mellitus II Sister   . Breast cancer Mother 69  . Heart failure Mother   . Diabetes Sister   . Hypertension Sister   . Thyroid disease Sister   . Anemia Sister   . Anesthesia problems Neg Hx   . Heart disease Father   . Heart attack Father   . Heart murmur Sister   . Heart murmur Daughter     Social History Social History   Tobacco Use  . Smoking status: Never Smoker  . Smokeless tobacco: Never Used  Substance Use Topics  . Alcohol use: No  . Drug use: No    Review of Systems  Constitutional: No fever/chills Eyes: No visual changes. ENT: No sore throat. Cardiovascular: Denies chest pain. Respiratory: Denies shortness of breath. Gastrointestinal: Positive lower abdominal pain.  No nausea, no vomiting.  No diarrhea.  No constipation. Genitourinary: Positive for dysuria. Musculoskeletal: Negative for back pain. Skin: Negative for rash. Neurological:  Negative for headaches, focal weakness or numbness.  10-point ROS otherwise negative.  ____________________________________________   PHYSICAL EXAM:  VITAL SIGNS: ED Triage Vitals [11/09/18 1056]  Enc Vitals Group     BP (!) 164/102     Pulse Rate 79     Resp 18     Temp 98 F (36.7 C)     Temp Source Oral     SpO2 100 %     Weight (!) 320 lb (145.2 kg)     Height 5\' 6"  (1.676 m)     Pain Score 10   Constitutional: Alert and oriented. Well appearing and in no acute distress. Eyes: Conjunctivae are normal. Head: Atraumatic. Nose: No  congestion/rhinnorhea. Mouth/Throat: Mucous membranes are moist.  Neck: No stridor.   Cardiovascular: Normal rate, regular rhythm. Good peripheral circulation. Grossly normal heart sounds.   Respiratory: Normal respiratory effort.  No retractions. Lungs CTAB. Gastrointestinal: Soft with moderate suprapubic tenderness. No rebound or guarding. No distention.  Genitourinary: Exam performed with chaperone. Mild discharge. Mild cervical discomfort but no severe pain. Mild/moderate left adnexal tenderness. Normal external genitalia. No prolapse.  Musculoskeletal: No lower extremity tenderness nor edema. No gross deformities of extremities. Neurologic:  Normal speech and language. No gross focal neurologic deficits are appreciated.  Skin:  Skin is warm, dry and intact. No rash noted.   ____________________________________________   LABS (all labs ordered are listed, but only abnormal results are displayed)  Labs Reviewed  WET PREP, GENITAL - Abnormal; Notable for the following components:      Result Value   Clue Cells Wet Prep HPF POC PRESENT (*)    WBC, Wet Prep HPF POC FEW (*)    All other components within normal limits  URINALYSIS, ROUTINE W REFLEX MICROSCOPIC - Abnormal; Notable for the following components:   APPearance CLOUDY (*)    Hgb urine dipstick LARGE (*)    Protein, ur 30 (*)    Leukocytes,Ua SMALL (*)    All other components within normal limits  URINALYSIS, MICROSCOPIC (REFLEX) - Abnormal; Notable for the following components:   Bacteria, UA FEW (*)    All other components within normal limits  PREGNANCY, URINE  GC/CHLAMYDIA PROBE AMP ([place]) NOT [facility]   ____________________________________________  RADIOLOGY  [place] Transvaginal Non-ob  Result Date: 11/09/2018 CLINICAL DATA:  Generalized pelvic pain/pressure following intercourse last night. History of C-section and tubal ligation. EXAM: TRANSABDOMINAL AND TRANSVAGINAL ULTRASOUND OF PELVIS TECHNIQUE: Both  transabdominal and transvaginal ultrasound examinations of the pelvis were performed. Transabdominal technique was performed for global imaging of the pelvis including uterus, ovaries, adnexal regions, and pelvic cul-de-sac. It was necessary to proceed with endovaginal exam following the transabdominal exam to visualize the endometrium and ovaries to better advantage. COMPARISON:  Obstetric ultrasound 06/25/2013. Abdominopelvic CT today. FINDINGS: Uterus Measurements: 9.0 x 5.0 x 4.5 cm = volume: 105 mL. No fibroids or other mass visualized. Right-sided cervical nabothian cyst measuring up to 2.0 cm. Endometrium Thickness: 5 mm.  No focal abnormality visualized. Right ovary Measurements: Not visualized.  No evidence of adnexal mass. Left ovary Measurements: Not visualized.  No evidence of adnexal mass. Other findings Trace free pelvic fluid. IMPRESSION: 1. No acute findings or explanation for the patient's symptoms. 2. Neither ovary visualized.  See separate CT report. 3. Right cervical nabothian cyst. Electronically Signed   By: Carey BullocksWilliam  Veazey M.D.   On: 11/09/2018 13:49   [place] Pelvis Complete  Result Date: 11/09/2018 CLINICAL DATA:  Generalized pelvic pain/pressure following intercourse  last night. History of C-section and tubal ligation. EXAM: TRANSABDOMINAL AND TRANSVAGINAL ULTRASOUND OF PELVIS TECHNIQUE: Both transabdominal and transvaginal ultrasound examinations of the pelvis were performed. Transabdominal technique was performed for global imaging of the pelvis including uterus, ovaries, adnexal regions, and pelvic cul-de-sac. It was necessary to proceed with endovaginal exam following the transabdominal exam to visualize the endometrium and ovaries to better advantage. COMPARISON:  Obstetric ultrasound 06/25/2013. Abdominopelvic CT today. FINDINGS: Uterus Measurements: 9.0 x 5.0 x 4.5 cm = volume: 105 mL. No fibroids or other mass visualized. Right-sided cervical nabothian cyst measuring up to 2.0 cm.  Endometrium Thickness: 5 mm.  No focal abnormality visualized. Right ovary Measurements: Not visualized.  No evidence of adnexal mass. Left ovary Measurements: Not visualized.  No evidence of adnexal mass. Other findings Trace free pelvic fluid. IMPRESSION: 1. No acute findings or explanation for the patient's symptoms. 2. Neither ovary visualized.  See separate CT report. 3. Right cervical nabothian cyst. Electronically Signed   By: Carey Bullocks M.D.   On: 11/09/2018 13:49   Ct Renal Stone Study  Result Date: 11/09/2018 CLINICAL DATA:  Generalized pelvic pain/pressure following intercourse last night. History of C-section and tubal ligation. EXAM: CT ABDOMEN AND PELVIS WITHOUT CONTRAST TECHNIQUE: Multidetector CT imaging of the abdomen and pelvis was performed following the standard protocol without IV contrast. COMPARISON:  None. FINDINGS: Lower chest: Clear lung bases. No significant pleural or pericardial effusion. Hepatobiliary: The liver demonstrates decreased density consistent with steatosis. No focal lesions identified on noncontrast imaging. There are possible noncalcified gallstones. No evidence of gallbladder wall thickening, surrounding inflammation or biliary dilatation. Pancreas: Unremarkable. No pancreatic ductal dilatation or surrounding inflammatory changes. Spleen: Normal in size without focal abnormality. Adrenals/Urinary Tract: Both adrenal glands appear normal. The kidneys, ureters and bladder appear normal. No evidence of urinary tract calculus or hydronephrosis. The bladder is incompletely distended. Stomach/Bowel: No evidence of bowel wall thickening, distention or surrounding inflammatory change. The appendix appears normal. Vascular/Lymphatic: There are no enlarged abdominal or pelvic lymph nodes. No significant vascular findings on noncontrast imaging. Reproductive: Right-sided cervical nabothian cyst measuring 1.8 cm on image 74. The uterus and ovaries otherwise appear normal. No  adnexal mass. Other: No evidence of abdominal wall mass or hernia. No ascites. Musculoskeletal: No acute or significant osseous findings. IMPRESSION: 1. No acute findings or explanation for the patient's symptoms. 2. No evidence of urinary tract calculus or hydronephrosis. The appendix appears normal. 3. Hepatic steatosis and possible cholelithiasis. No evidence of cholecystitis or biliary dilatation. 4. The uterus and ovaries appear normal aside from a right-sided nabothian cyst. Electronically Signed   By: Carey Bullocks M.D.   On: 11/09/2018 13:54    ____________________________________________   PROCEDURES  Procedure(s) performed:   Procedures  None ____________________________________________   INITIAL IMPRESSION / ASSESSMENT AND PLAN / ED COURSE  Pertinent labs & imaging results that were available during my care of the patient were reviewed by me and considered in my medical decision making (see chart for details).  Patient presents to the emergency department with lower abdominal pain/pressure with symptoms worse at the end of urination.  Urinalysis is cloudy with hemoglobin and few bacteria. Doubt UTI or pyelonephritis.  Low suspicion for kidney stone although blood in the urine does complicate the picture.   Pelvic exam with no concern for PID. TVUS reviewed. Unable to assess ovary flow. Suspicion for torsion is very low. CT renal without kidney stone or other findings. Labs reviewed. Plan for treatment of BV and  PCP/Gyn follow up.  ____________________________________________  FINAL CLINICAL IMPRESSION(S) / ED DIAGNOSES  Final diagnoses:  Lower abdominal pain  BV (bacterial vaginosis)  Hematuria, unspecified type     MEDICATIONS GIVEN DURING THIS VISIT:  Medications  acetaminophen (TYLENOL) tablet 1,000 mg (1,000 mg Oral Given 11/09/18 1405)     NEW OUTPATIENT MEDICATIONS STARTED DURING THIS VISIT:  Discharge Medication List as of 11/09/2018  2:19 PM    START  taking these medications   Details  fluconazole (DIFLUCAN) 200 MG tablet Take 1 tablet (200 mg total) by mouth daily as needed for up to 2 days (yeast infection symptoms)., Starting Sat 11/09/2018, Until Mon 11/11/2018, Print    ibuprofen (ADVIL,MOTRIN) 800 MG tablet Take 1 tablet (800 mg total) by mouth every 8 (eight) hours as needed for moderate pain., Starting Sat 11/09/2018, Print    metroNIDAZOLE (FLAGYL) 500 MG tablet Take 1 tablet (500 mg total) by mouth 2 (two) times daily for 7 days., Starting Sat 11/09/2018, Until Sat 11/16/2018, Print        Note:  This document was prepared using Dragon voice recognition software and may include unintentional dictation errors.  Alona Bene, MD Emergency Medicine    Rusty Glodowski, Arlyss Repress, MD 11/09/18 [phone]

## 2018-11-09 NOTE — ED Notes (Addendum)
Pt c/o having lower abdominal pain and pressure constant that increases at end of urination  that started last night afater sexual intercourse. Denies fever and  Bleeding. Pt also c/o lower back pain that statrted x 2 days ago. Denies chest pain, sob. Denies nausea, vomiting & diarrhea. No medication taken per pt for pain

## 2018-11-11 LAB — GC/CHLAMYDIA PROBE AMP (~~LOC~~) NOT AT ARMC
Chlamydia: NEGATIVE
NEISSERIA GONORRHEA: NEGATIVE

## 2018-11-13 ENCOUNTER — Encounter: Payer: Self-pay | Admitting: Family Medicine

## 2018-11-13 ENCOUNTER — Ambulatory Visit (INDEPENDENT_AMBULATORY_CARE_PROVIDER_SITE_OTHER): Payer: Medicaid Other | Admitting: Family Medicine

## 2018-11-13 VITALS — BP 142/90 | HR 78 | Temp 98.8°F | Ht 66.0 in | Wt 324.0 lb

## 2018-11-13 DIAGNOSIS — R102 Pelvic and perineal pain: Secondary | ICD-10-CM

## 2018-11-13 DIAGNOSIS — E119 Type 2 diabetes mellitus without complications: Secondary | ICD-10-CM | POA: Diagnosis not present

## 2018-11-13 DIAGNOSIS — R8290 Unspecified abnormal findings in urine: Secondary | ICD-10-CM | POA: Diagnosis not present

## 2018-11-13 DIAGNOSIS — Z0000 Encounter for general adult medical examination without abnormal findings: Secondary | ICD-10-CM | POA: Diagnosis not present

## 2018-11-13 DIAGNOSIS — Z09 Encounter for follow-up examination after completed treatment for conditions other than malignant neoplasm: Secondary | ICD-10-CM | POA: Diagnosis not present

## 2018-11-13 DIAGNOSIS — F419 Anxiety disorder, unspecified: Secondary | ICD-10-CM

## 2018-11-13 DIAGNOSIS — I10 Essential (primary) hypertension: Secondary | ICD-10-CM | POA: Diagnosis not present

## 2018-11-13 LAB — POCT URINALYSIS DIP (MANUAL ENTRY)
Bilirubin, UA: NEGATIVE
Glucose, UA: NEGATIVE mg/dL
Ketones, POC UA: NEGATIVE mg/dL
Nitrite, UA: NEGATIVE
Spec Grav, UA: 1.025 (ref 1.010–1.025)
Urobilinogen, UA: 0.2 E.U./dL
pH, UA: 5.5 (ref 5.0–8.0)

## 2018-11-13 MED ORDER — CYCLOBENZAPRINE HCL 10 MG PO TABS: 10.0000 mg | ORAL_TABLET | Freq: Three times a day (TID) | ORAL | 1 refills | Status: DC | PRN

## 2018-11-13 MED ORDER — NAPROXEN 500 MG PO TABS: 500.0000 mg | ORAL_TABLET | Freq: Two times a day (BID) | ORAL | 1 refills | Status: DC

## 2018-11-13 MED ORDER — BUSPIRONE HCL 10 MG PO TABS: 10.0000 mg | ORAL_TABLET | Freq: Two times a day (BID) | ORAL | 3 refills | Status: DC

## 2018-11-13 NOTE — Progress Notes (Signed)
Patient [facility]  Established Patient Office Visit  Subjective:  Patient ID: Autumn [name], female    DOB: Feb 14, 1986  Age: 33 y.o. MRN: [id number]  CC:  Chief Complaint  Patient presents with  . Hospitalization Follow-up  . Referral    Pelvic pain/Obgyn    HPI Autumn [name] is a 33 year old female who presents for Hospital Follow Up.   Past Medical History:  Diagnosis Date  . Essential hypertension   . H/O varicella   . Headache   . Heart murmur   . Hypertension    stopped meds since pregnant  . Leaky heart valve    mild MR by echo  . Obese    Current Status: Since her last ED visit on 11/09/2018 for lower abdominal pain, she has been to is doing well with no complaints. She continues to have pelvic pain and pressure since incident. She states that her anxiety is moderate today. Buspar is effective sometimes. She denies suicidal ideations, homicidal ideations, or auditory hallucinations. She denies visual changes, chest pain, cough, shortness of breath, heart palpitations, and falls. She has occasional headaches and dizziness with position changes. Denies severe headaches, confusion, seizures, double vision, and blurred vision, nausea and vomiting.  She denies fevers, chills, fatigue, recent infections, weight loss, and night sweats. She has not had any falls. No reports of GI problems such as diarrhea, and constipation. She has no reports of blood in stools, dysuria and hematuria.   Past Surgical History:  Procedure Laterality Date  . BILATERAL SALPINGECTOMY Bilateral 11/28/2013   Procedure: BILATERAL SALPINGECTOMY;  Surgeon: Betsy Coder, MD;  Location: [facility];  Service: Obstetrics;  Laterality: Bilateral;  Partial on Left  . CESAREAN SECTION  05/14/2012   Procedure: CESAREAN SECTION;  Surgeon: Eldred Manges, MD;  Location: [facility];  Service: Gynecology;  Laterality: N/A;  Primary cesarean section of baby girl     at [facility] 8/9  . CESAREAN SECTION N/A 11/28/2013   Procedure: CESAREAN SECTION;  Surgeon: Betsy Coder, MD;  Location: [facility];  Service: Obstetrics;  Laterality: N/A;  . CESAREAN SECTION      Family History  Problem Relation Age of Onset  . Hypertension Mother   . Diabetes Mellitus II Mother   . CAD Father   . Hyperlipidemia Father   . Hypertension Sister   . Diabetes Mellitus II Sister   . Breast cancer Mother 12  . Heart failure Mother   . Diabetes Sister   . Hypertension Sister   . Thyroid disease Sister   . Anemia Sister   . Anesthesia problems Neg Hx   . Heart disease Father   . Heart attack Father   . Heart murmur Sister   . Heart murmur Daughter     Social History   Socioeconomic History  . Marital status: Married    Spouse name: Not on file  . Number of children: Not on file  . Years of education: Not on file  . Highest education level: Not on file  Occupational History  . Not on file  Social Needs  . Financial resource strain: Not on file  . Food insecurity:    Worry: Not on file    Inability: Not on file  . Transportation needs:    Medical: Not on file    Non-medical: Not on file  Tobacco Use  . Smoking status: Never Smoker  . Smokeless tobacco: Never Used  Substance and Sexual Activity  . Alcohol use: No  . Drug use: No  . Sexual activity: Yes    Birth control/protection: None  Lifestyle  . Physical activity:    Days per week: Not on file    Minutes per session: Not on file  . Stress: Not on file  Relationships  . Social connections:    Talks on phone: Not on file    Gets together: Not on file    Attends religious service: Not on file    Active member of club or organization: Not on file    Attends meetings of clubs or organizations: Not on file    Relationship status: Not on file  . Intimate partner violence:    Fear of current or ex partner: Not on file    Emotionally abused: Not on file    Physically abused: Not on file     Forced sexual activity: Not on file  Other Topics Concern  . Not on file  Social History Narrative   ** Merged History Encounter **       Lives with husband and 2 children in a one story home.   Works as an Building control surveyor.   Education: college degree.    Outpatient Medications Prior to Visit  Medication Sig Dispense Refill  . acetaminophen (TYLENOL) 500 MG tablet Take 500 mg by mouth every 6 (six) hours as needed for mild pain or headache.    Marland Kitchen amLODipine (NORVASC) 10 MG tablet Take 1 tablet (10 mg total) by mouth daily. 30 tablet 2  . aspirin EC 325 MG tablet Take 325 mg by mouth daily.    Marland Kitchen atorvastatin (LIPITOR) 10 MG tablet Take 10 mg by mouth daily.  1  . bisoprolol (ZEBETA) 5 MG tablet Take 1 tablet (5 mg total) by mouth daily. 30 tablet 2  . blood glucose meter kit and supplies Dispense based on patient and insurance preference. Use up to four times daily as directed. (FOR ICD-9 250.00, 250.01). 1 each 0  . gabapentin (NEURONTIN) 100 MG capsule Take 2 capsule (200 mg total) twice daily. 120 capsule 2  . hydrochlorothiazide (HYDRODIURIL) 25 MG tablet Take 1 tablet (25 mg total) by mouth daily. 30 tablet 2  . ibuprofen (ADVIL,MOTRIN) 800 MG tablet Take 1 tablet (800 mg total) by mouth every 8 (eight) hours as needed for moderate pain. 21 tablet 0  . losartan (COZAAR) 25 MG tablet Take 1 tablet (25 mg total) by mouth daily. 30 tablet 2  . metFORMIN (GLUCOPHAGE-XR) 500 MG 24 hr tablet Take 1 tablet (500 mg total) by mouth daily with breakfast. 90 tablet 3  . metroNIDAZOLE (FLAGYL) 500 MG tablet Take 1 tablet (500 mg total) by mouth 2 (two) times daily for 7 days. 14 tablet 0  . topiramate (TOPAMAX) 25 MG tablet Take 1 tablet (25 mg total) by mouth 2 (two) times daily. 60 tablet 2  . busPIRone (BUSPAR) 5 MG tablet Take 1 tablet (5 mg total) by mouth 2 (two) times daily. 60 tablet 2  . ferrous sulfate (FERROUSUL) 325 (65 FE) MG tablet Take 1 tablet (325 mg total) by mouth daily with  breakfast. (Patient not taking: Reported on 11/13/2018) 30 tablet 0   No facility-administered medications prior to visit.     No Known Allergies  ROS Review of Systems  Constitutional: Negative.   HENT: Negative.   Eyes: Negative.   Respiratory: Negative.   Cardiovascular: Negative.   Gastrointestinal: Negative.   Endocrine: Negative.  Genitourinary: Negative.   Musculoskeletal: Negative.   Skin: Negative.   Allergic/Immunologic: Negative.   Neurological: Negative.   Hematological: Negative.   Psychiatric/Behavioral: Negative.       Objective:    Physical Exam  Constitutional: She is oriented to person, place, and time. She appears well-developed and well-nourished.  HENT:  Head: Normocephalic and atraumatic.  Eyes: Conjunctivae are normal.  Neck: Normal range of motion. Neck supple.  Cardiovascular: Normal rate, regular rhythm, normal heart sounds and intact distal pulses.  Pulmonary/Chest: Effort normal and breath sounds normal.  Abdominal: Soft. Bowel sounds are normal.  Musculoskeletal: Normal range of motion.  Neurological: She is alert and oriented to person, place, and time. She has normal reflexes.  Skin: Skin is warm and dry.  Psychiatric: She has a normal mood and affect. Her behavior is normal. Judgment and thought content normal.  Vitals reviewed.   BP (!) 142/90   Pulse 78   Temp 98.8 F (37.1 C) (Oral)   Ht 5' 6"  (1.676 m)   Wt (!) 324 lb (147 kg)   LMP 10/31/2018   SpO2 100%   BMI 52.29 kg/m  Wt Readings from Last 3 Encounters:  11/13/18 (!) 324 lb (147 kg)  11/09/18 (!) 320 lb (145.2 kg)  09/02/18 (!) 327 lb (148.3 kg)   Health Maintenance Due  Topic Date Due  . PNEUMOCOCCAL POLYSACCHARIDE VACCINE AGE 23-64 HIGH RISK  01/23/1986  . FOOT EXAM  01/23/1994  . OPHTHALMOLOGY EXAM  01/23/1994  . PAP SMEAR-Modifier  06/04/2008  . INFLUENZA VACCINE  04/25/2018    There are no preventive care reminders to display for this patient.  Lab  Results  Component Value Date   TSH 2.850 11/13/2018   Lab Results  Component Value Date   WBC 6.0 11/13/2018   HGB 11.5 11/13/2018   HCT 35.4 11/13/2018   MCV 74 (L) 11/13/2018   PLT 341 11/13/2018   Lab Results  Component Value Date   NA 141 08/28/2017   K 4.5 08/28/2017   CO2 22 08/28/2017   GLUCOSE 99 08/28/2017   BUN 8 08/28/2017   CREATININE 0.67 08/28/2017   BILITOT 0.4 05/01/2017   ALKPHOS 91 05/01/2017   AST 22 05/01/2017   ALT 20 05/01/2017   PROT 8.1 05/01/2017   ALBUMIN 3.2 (L) 05/01/2017   CALCIUM 9.2 08/28/2017   ANIONGAP 7 05/03/2017   Lab Results  Component Value Date   CHOL 186 11/13/2018   Lab Results  Component Value Date   HDL 49 11/13/2018   Lab Results  Component Value Date   LDLCALC 114 (H) 11/13/2018   Lab Results  Component Value Date   TRIG 115 11/13/2018   Lab Results  Component Value Date   CHOLHDL 3.8 11/13/2018   Lab Results  Component Value Date   HGBA1C 5.9 (A) 07/17/2018   Assessment & Plan:   1. Hospital discharge follow-up  2. Pelvic pain We will initiate Naproxen and Flexeril today to aide in pain. Referred patient to OB-GYN and Nutritionist also today.  - POCT urinalysis dipstick - Urine Culture - naproxen (NAPROSYN) 500 MG tablet; Take 1 tablet (500 mg total) by mouth 2 (two) times daily with a meal.  Dispense: 30 tablet; Refill: 1 - cyclobenzaprine (FLEXERIL) 10 MG tablet; Take 1 tablet (10 mg total) by mouth 3 (three) times daily as needed for muscle spasms.  Dispense: 30 tablet; Refill: 1 - Ambulatory referral to Obstetrics / Gynecology - Ambulatory referral to Diabetes and Nutrition  3. Essential hypertension Blood pressure is stable at 142/90. Continue Losartan, Bisoprolol, Amlodipine, and HCTZ. She will continue to decrease high sodium intake, excessive alcohol intake, increase potassium intake, smoking cessation, and increase physical activity of at least 30 minutes of cardio activity daily. She will  continue to follow Heart Healthy or DASH diet.  4. Type 2 diabetes mellitus without complication, without long-term current use of insulin (HCC) Hgb A1c stable at 5.9 on 07/17/2018. Continue Metformin as prescribed. She will continue to decrease foods/beverages high in sugars and carbs and follow Heart Healthy or DASH diet. Increase physical activity to at least 30 minutes cardio exercise daily.   5. Anxiety We will increase Buspar to 10 mg BID.   6. Abnormal urinalysis Results are pending.  - Urine Culture  7. Healthcare maintenance - CBC with Differential - Comprehensive metabolic panel; Future - Lipid Panel - TSH  8. Follow up She will follow up in 1 month.   Meds ordered this encounter  Medications  . naproxen (NAPROSYN) 500 MG tablet    Sig: Take 1 tablet (500 mg total) by mouth 2 (two) times daily with a meal.    Dispense:  30 tablet    Refill:  1  . cyclobenzaprine (FLEXERIL) 10 MG tablet    Sig: Take 1 tablet (10 mg total) by mouth 3 (three) times daily as needed for muscle spasms.    Dispense:  30 tablet    Refill:  1  . busPIRone (BUSPAR) 10 MG tablet    Sig: Take 1 tablet (10 mg total) by mouth 2 (two) times daily.    Dispense:  60 tablet    Refill:  3   Orders Placed This Encounter  Procedures  . Urine Culture  . CBC with Differential  . Comprehensive metabolic panel  . Lipid Panel  . TSH  . Ambulatory referral to Obstetrics / Gynecology  . Referral to Nutrition and Diabetes Services  . POCT urinalysis dipstick    Referral Orders     Ambulatory referral to Obstetrics / Gynecology     Referral to Nutrition and Diabetes Services  Problem List Items Addressed This Visit      Cardiovascular and Mediastinum   Essential hypertension     Endocrine   Type 2 diabetes mellitus without complication ([facility])   Relevant Orders   Referral to Nutrition and Diabetes Services    Other Visit Diagnoses    Hospital discharge follow-up    -  Primary   Pelvic pain        Relevant Medications   naproxen (NAPROSYN) 500 MG tablet   cyclobenzaprine (FLEXERIL) 10 MG tablet   Other Relevant Orders   POCT urinalysis dipstick (Completed)   Urine Culture   Ambulatory referral to Obstetrics / Gynecology   Anxiety       Relevant Medications   busPIRone (BUSPAR) 10 MG tablet   Abnormal urinalysis       Relevant Orders   Urine Culture   Healthcare maintenance       Relevant Orders   CBC with Differential (Completed)   Comprehensive metabolic panel   Lipid Panel (Completed)   TSH (Completed)   Follow up          Meds ordered this encounter  Medications  . naproxen (NAPROSYN) 500 MG tablet    Sig: Take 1 tablet (500 mg total) by mouth 2 (two) times daily with a meal.    Dispense:  30 tablet    Refill:  1  .  cyclobenzaprine (FLEXERIL) 10 MG tablet    Sig: Take 1 tablet (10 mg total) by mouth 3 (three) times daily as needed for muscle spasms.    Dispense:  30 tablet    Refill:  1  . busPIRone (BUSPAR) 10 MG tablet    Sig: Take 1 tablet (10 mg total) by mouth 2 (two) times daily.    Dispense:  60 tablet    Refill:  3    Follow-up: Return in about 1 month (around 12/12/2018).    Azzie Glatter, FNP

## 2018-11-14 LAB — CBC WITH DIFFERENTIAL/PLATELET
Basophils Absolute: 0 10*3/uL (ref 0.0–0.2)
Basos: 1 %
EOS (ABSOLUTE): 0.2 10*3/uL (ref 0.0–0.4)
Eos: 3 %
Hematocrit: 35.4 % (ref 34.0–46.6)
Hemoglobin: 11.5 g/dL (ref 11.1–15.9)
Immature Grans (Abs): 0 10*3/uL (ref 0.0–0.1)
Immature Granulocytes: 0 %
Lymphocytes Absolute: 2.5 10*3/uL (ref 0.7–3.1)
Lymphs: 42 %
MCH: 23.9 pg — ABNORMAL LOW (ref 26.6–33.0)
MCHC: 32.5 g/dL (ref 31.5–35.7)
MCV: 74 fL — ABNORMAL LOW (ref 79–97)
Monocytes Absolute: 0.4 10*3/uL (ref 0.1–0.9)
Monocytes: 6 %
Neutrophils Absolute: 2.9 10*3/uL (ref 1.4–7.0)
Neutrophils: 48 %
Platelets: 341 10*3/uL (ref 150–450)
RBC: 4.81 x10E6/uL (ref 3.77–5.28)
RDW: 15.6 % — ABNORMAL HIGH (ref 11.7–15.4)
WBC: 6 10*3/uL (ref 3.4–10.8)

## 2018-11-14 LAB — LIPID PANEL
Chol/HDL Ratio: 3.8 ratio (ref 0.0–4.4)
Cholesterol, Total: 186 mg/dL (ref 100–199)
HDL: 49 mg/dL (ref >39)
LDL Calculated: 114 mg/dL — ABNORMAL HIGH (ref 0–99)
Triglycerides: 115 mg/dL (ref 0–149)
VLDL Cholesterol Cal: 23 mg/dL (ref 5–40)

## 2018-11-14 LAB — TSH: TSH: 2.85 u[IU]/mL (ref 0.450–4.500)

## 2018-11-15 ENCOUNTER — Other Ambulatory Visit: Payer: Self-pay | Admitting: Family Medicine

## 2018-11-15 ENCOUNTER — Telehealth: Payer: Self-pay

## 2018-11-15 DIAGNOSIS — F419 Anxiety disorder, unspecified: Secondary | ICD-10-CM

## 2018-11-15 LAB — URINE CULTURE

## 2018-11-15 NOTE — Telephone Encounter (Signed)
Called behavorial health and they state a new referral needs to be seen because that only keep them for a month. They were unable to schedule patient back in January because they were booked out so far. They have opening but not until April

## 2018-11-15 NOTE — Telephone Encounter (Signed)
-----   Message from Kallie Locks, FNP sent at 11/14/2018 10:43 AM EST ----- Regarding: "Referrals" Assess if we previously referred patient to Psychiatry, and the outcomes.  We referred her to Diabetes and Nutrition today. Please inform patient.

## 2018-11-15 NOTE — Telephone Encounter (Signed)
Tried to contact patient no answer 

## 2018-11-17 ENCOUNTER — Inpatient Hospital Stay (HOSPITAL_COMMUNITY)
Admission: AD | Admit: 2018-11-17 | Discharge: 2018-11-17 | Disposition: A | Discharge status: Home / Self Care | Payer: Medicaid Other | Attending: Obstetrics & Gynecology | Admitting: Obstetrics & Gynecology

## 2018-11-17 ENCOUNTER — Other Ambulatory Visit: Payer: Self-pay

## 2018-11-17 DIAGNOSIS — R102 Pelvic and perineal pain: Secondary | ICD-10-CM

## 2018-11-17 DIAGNOSIS — Z3202 Encounter for pregnancy test, result negative: Secondary | ICD-10-CM | POA: Diagnosis not present

## 2018-11-17 NOTE — MAU Provider Note (Signed)
First Provider Initiated Contact with Patient 11/17/18 1314      S Ms. Autumn [name] is a 33 y.o. [phone] non-pregnant female who presents to MAU today with complaint of lower abdominal pain and hematuria. Patient states that she called her sickle cell doctor and they advised her to come here to be seen. Patient has had a BTL, and is not concerned about pregnancy.   O BP 135/79   Pulse 87   Temp 98.7 F (37.1 C)   Resp 18   LMP 10/31/2018   SpO2 98%  Physical Exam  Nursing note and vitals reviewed. Constitutional: She is oriented to person, place, and time. She appears well-developed and well-nourished. No distress.  HENT:  Head: Normocephalic.  Cardiovascular: Normal rate.  Respiratory: Effort normal.  Neurological: She is alert and oriented to person, place, and time.  Skin: Skin is warm and dry.  Psychiatric: She has a normal mood and affect.   UPT is negative here today, not flowing to epic at this time.   A 1. Pelvic pain in female   Non pregnant female Medical screening exam complete   P Discharge from MAU in stable condition Patient given the option of transfer to [facility] for further evaluation or seek care in outpatient facility of choice. Patient declines transfer to [facility] for FU, and will FU with her PCP.  Warning signs for worsening condition that would warrant emergency follow-up discussed Patient may return to MAU as needed for pregnancy related complaints  Thressa Sheller DNP, CNM  11/17/18  1:20 PM

## 2018-11-17 NOTE — Discharge Instructions (Signed)
Abdominal Pain, Adult  Abdominal pain can be caused by many things. Often, abdominal pain is not serious and it gets better with no treatment or by being treated at home. However, sometimes abdominal pain is serious. Your health care provider will do a medical history and a physical exam to try to determine the cause of your abdominal pain.  Follow these instructions at home:   Take over-the-counter and prescription medicines only as told by your health care provider. Do not take a laxative unless told by your health care provider.   Drink enough fluid to keep your urine clear or pale yellow.   Watch your condition for any changes.   Keep all follow-up visits as told by your health care provider. This is important.  Contact a health care provider if:   Your abdominal pain changes or gets worse.   You are not hungry or you lose weight without trying.   You are constipated or have diarrhea for more than 2-3 days.   You have pain when you urinate or have a bowel movement.   Your abdominal pain wakes you up at night.   Your pain gets worse with meals, after eating, or with certain foods.   You are throwing up and cannot keep anything down.   You have a fever.  Get help right away if:   Your pain does not go away as soon as your health care provider told you to expect.   You cannot stop throwing up.   Your pain is only in areas of the abdomen, such as the right side or the left lower portion of the abdomen.   You have bloody or black stools, or stools that look like tar.   You have severe pain, cramping, or bloating in your abdomen.   You have signs of dehydration, such as:  ? Dark urine, very little urine, or no urine.  ? Cracked lips.  ? Dry mouth.  ? Sunken eyes.  ? Sleepiness.  ? Weakness.  This information is not intended to replace advice given to you by your health care provider. Make sure you discuss any questions you have with your health care provider.  Document Released: 06/21/2005 Document  Revised: 03/31/2016 Document Reviewed: 02/23/2016  Elsevier Interactive Patient Education  2019 [employer]

## 2018-11-17 NOTE — MAU Note (Signed)
Pt presents to MAU with complaints of pelvic pain and blood in her urine,

## 2018-11-18 NOTE — Telephone Encounter (Signed)
Patient notified that the referral was sent

## 2018-11-18 NOTE — Telephone Encounter (Signed)
Patient notified

## 2018-12-02 ENCOUNTER — Encounter: Payer: Self-pay | Admitting: Registered"

## 2018-12-02 ENCOUNTER — Encounter: Payer: Medicaid Other | Attending: Family Medicine | Admitting: Registered"

## 2018-12-02 DIAGNOSIS — E119 Type 2 diabetes mellitus without complications: Secondary | ICD-10-CM | POA: Diagnosis not present

## 2018-12-02 NOTE — Patient Instructions (Signed)
Consider asking at farmers markets if they accept EBT and if they double the benefits. The [employer] market and [employer]. SpecialAim.[employer].za  Water aerobics may be an option for exercise that won't hurst your legs.  Can start modified meal prepping by just cooking the meat you might use for several different recipes eg. Chili, spaghetti, tacos, casserole.  Aim to eat 3 balanced meals per day, Consider sitting down with your daughter for lunch.  Consider checking your blood sugar when you are having low blood sugar symptoms and treat with 15 grams of carb (1/2 cup juice).

## 2018-12-02 NOTE — Progress Notes (Signed)
Diabetes Self-Management Education  Visit Type: First/Initial  Appt. Start Time: 1515 Appt. End Time: 1640  12/02/2018  Ms. Autumn GosselinJacinta [name], identified by name and date of birth, is a 33 y.o. female with a diagnosis of Diabetes: Type 2.   ASSESSMENT  There were no vitals taken for this visit. There is no height or weight on file to calculate BMI.   Pt sates she would like to get better eating habits, break old habits, states she needs variety but gets overwhelmed figuring out what to eat. Pt states she wants quick, easy meals. Pt states they are a 1-income family, has EBT benefits.  Pt states her BP increased and about the same time started having migraine 2 yrs ago, episodes not related to menstrual cycle, stress or food that she can tell.  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 Pt states she has many stresses including family responsibilities being the youngest of 5 sisters. Pt states her parents are elderly, live on their own and active pastors at their church.  Pt states when she was diagnosed 2 1/2 yrs ago not a lot of changes made except maybe more intentional in eating less bread and pasta. Pt states her problem is she loves sweets, ~2x will have ice cream or cake after dinner, states husband likes candy, but that's not a problem for her.  Pt states she doesn't sit down for breakfast, eats something small to take medicine. Patient states she wakes up with a list of things she wants to get done and doesn't take time to eat during the day, 2x week may get sweaty, jittery and realize she hasn't eaten. Pt reports she fixes lunch for daughter - Broccoli, fruit cup, chicken nuggets, but continues to clean kitchen, do laundry etc while daughter is eating. Pt reports dinner is usually by 6 pm except on Tues bible study late dinner 7:30-8 pm. Pt states family dinners are a priority in their house.  Pt states she has a lot of fatigue and her MD has been running  blood work. Pt states her periods have been very irregular last 3 years, has appointment with OBGYN, has family hx of fibroids. Pt states until recently period has always been regular, not too much pain, but has always had a heavy flow, no issues with fertility.  Physical activity: Pt states when she was trying to walk 3x week 30 min she would be in extreme pain (legs) Pt states her doctors don't know why her legs hurt so bad.   Diabetes Self-Management Education - 12/02/18 1535      Visit Information   Visit Type  First/Initial      Initial Visit   Diabetes Type  Type 2    Are you currently following a meal plan?  No    Are you taking your medications as prescribed?  Yes   500 mg metformin   Date Diagnosed  2.5 yrs ago per patient      Health Coping   How would you rate your overall health?  Poor      Psychosocial Assessment   Patient Belief/Attitude about Diabetes  Defeat/Burnout    How often do you need to have someone help you when you read instructions, pamphlets, or other written materials from your doctor or pharmacy?  1 - Never    What is the last grade level you completed in school?  2 yrs of college  Complications   Last HgB A1C per patient/outside source  5.9 %    How often do you check your blood sugar?  0 times/day (not testing)    Number of hypoglycemic episodes per month  8   has symptoms 2x week, doesn't check BG   Can you tell when your blood sugar is low?  Yes    What do you do if your blood sugar is low?  OJ or PB sandwich or spoonful of PB or crackers    Have you had a dilated eye exam in the past 12 months?  No    Have you had a dental exam in the past 12 months?  No    Are you checking your feet?  Yes    How many days per week are you checking your feet?  1      Dietary Intake   Breakfast  none OR bowl oatmeal OR toast     Snack (morning)  none    Lunch  none    Snack (afternoon)  none    Dinner  chicken, green beans OR spaghetti, salad OR pinto  beans 1-2x month, fish once in awhile   6 pm   Snack (evening)  none    Beverage(s)  water with lemon       Exercise   Exercise Type  ADL's    How many days per week to you exercise?  0    How many minutes per day do you exercise?  0    Total minutes per week of exercise  0      Patient Education   Previous Diabetes Education  No    Nutrition management   Role of diet in the treatment of diabetes and the relationship between the three main macronutrients and blood glucose level    Physical activity and exercise   Role of exercise on diabetes management, blood pressure control and cardiac health.    Monitoring  Purpose and frequency of SMBG.    Acute complications  Taught treatment of hypoglycemia - the 15 rule.    Psychosocial adjustment  Worked with patient to identify barriers to care and solutions;Role of stress on diabetes   sleep     Individualized Goals (developed by patient)   Nutrition  General guidelines for healthy choices and portions discussed    Monitoring   test my blood glucose as discussed    Reducing Risk  treat hypoglycemia with 15 grams of carbs if blood glucose less than 70mg /dL      Outcomes   Expected Outcomes  Demonstrated interest in learning. Expect positive outcomes    Future DMSE  4-6 wks    Program Status  Not Completed       Individualized Plan for Diabetes Self-Management Training:   Learning Objective:  Patient will have a greater understanding of diabetes self-management. Patient education plan is to attend individual and/or group sessions per assessed needs and concerns.   Patient Instructions  Consider asking at farmers markets if they accept EBT and if they double the benefits. The [employer] market and [employer]. [email or web address]  Water aerobics may be an option for exercise that won't hurst your legs.  Can start modified meal prepping by just cooking the meat you might use for  several different recipes eg. Chili, spaghetti, tacos, casserole.  Aim to eat 3 balanced meals per day, Consider sitting down with your daughter for lunch.  Consider checking your blood sugar when you are  having low blood sugar symptoms and treat with 15 grams of carb (1/2 cup juice).   Expected Outcomes:  Demonstrated interest in learning. Expect positive outcomes  Education material provided: A1C conversion sheet and My Plate, sleep hygiene, therapists, mysugr app instructions  If problems or questions, patient to contact team via:  Phone  Future DSME appointment: 4-6 wks

## 2018-12-11 ENCOUNTER — Ambulatory Visit (INDEPENDENT_AMBULATORY_CARE_PROVIDER_SITE_OTHER): Payer: Medicaid Other | Admitting: Family Medicine

## 2018-12-11 ENCOUNTER — Other Ambulatory Visit: Payer: Self-pay

## 2018-12-11 ENCOUNTER — Telehealth: Payer: Self-pay

## 2018-12-11 ENCOUNTER — Encounter: Payer: Self-pay | Admitting: Family Medicine

## 2018-12-11 VITALS — BP 140/90 | HR 84 | Temp 97.9°F | Resp 16 | Ht 66.0 in | Wt 332.2 lb

## 2018-12-11 DIAGNOSIS — Z09 Encounter for follow-up examination after completed treatment for conditions other than malignant neoplasm: Secondary | ICD-10-CM | POA: Diagnosis not present

## 2018-12-11 DIAGNOSIS — R600 Localized edema: Secondary | ICD-10-CM | POA: Insufficient documentation

## 2018-12-11 DIAGNOSIS — R102 Pelvic and perineal pain unspecified side: Secondary | ICD-10-CM

## 2018-12-11 DIAGNOSIS — F419 Anxiety disorder, unspecified: Secondary | ICD-10-CM | POA: Diagnosis not present

## 2018-12-11 DIAGNOSIS — I10 Essential (primary) hypertension: Secondary | ICD-10-CM

## 2018-12-11 DIAGNOSIS — Z6841 Body Mass Index (BMI) 40.0 and over, adult: Secondary | ICD-10-CM

## 2018-12-11 DIAGNOSIS — R609 Edema, unspecified: Secondary | ICD-10-CM | POA: Diagnosis not present

## 2018-12-11 DIAGNOSIS — E66813 Obesity, class 3: Secondary | ICD-10-CM

## 2018-12-11 DIAGNOSIS — E119 Type 2 diabetes mellitus without complications: Secondary | ICD-10-CM | POA: Diagnosis not present

## 2018-12-11 MED ORDER — FUROSEMIDE 20 MG PO TABS: 20.0000 mg | ORAL_TABLET | Freq: Every day | ORAL | 2 refills | Status: DC

## 2018-12-11 MED ORDER — METFORMIN HCL ER 500 MG PO TB24: 500.0000 mg | ORAL_TABLET | Freq: Every day | ORAL | 2 refills | Status: DC

## 2018-12-11 NOTE — Telephone Encounter (Signed)
Appointment pre-screening for COVID-19 completed.  Negative screening results. 

## 2018-12-11 NOTE — Progress Notes (Signed)
Patient Autumn [name] [facility]  Established Patient Office Visit  Subjective:  Patient ID: Autumn [name], female    DOB: 1986-03-02  Age: 33 y.o. MRN: [id number]  CC:  Chief Complaint  Patient presents with  . Follow-up    still pelvic pain waiting on OB gyn referral    HPI Autumn [name] is a 33 year old female who presents for Follow Up today.   Past Medical History:  Diagnosis Date  . Essential hypertension   . H/O varicella   . Headache   . Heart murmur   . Hypertension    stopped meds since pregnant  . Leaky heart valve    mild MR by echo  . Obese    Current Status: Since her last office visit, she denies visual changes, chest pain, cough, shortness of breath, heart palpitations, and falls. She has occasional headaches and dizziness with position changes. Denies severe headaches, confusion, seizures, double vision, and blurred vision, nausea and vomiting. Her anxiety is moderate today, r/t her weight management. She reports increased fatigue. She denies suicidal ideations, homicidal ideations, or auditory hallucinations.She continues to follow up with Nutritionist. She continues to experience lower abdominal pain, but it is much milder. Her periods are heavy, but tolerable. No reports of GI problems such as diarrhea, and constipation. She has no reports of blood in stools, dysuria and hematuria.   She denies fevers, chills, fatigue, recent infections, weight loss, and night sweats. She denies pain today.   Past Surgical History:  Procedure Laterality Date  . BILATERAL SALPINGECTOMY Bilateral 11/28/2013   Procedure: BILATERAL SALPINGECTOMY;  Surgeon: Betsy Coder, MD;  Location: [facility];  Service: Obstetrics;  Laterality: Bilateral;  Partial on Left  . CESAREAN SECTION  05/14/2012   Procedure: CESAREAN SECTION;  Surgeon: Eldred Manges, MD;  Location: [facility];  Service: Gynecology;  Laterality: N/A;  Primary cesarean section  of baby girl    at [place] 8/9  . CESAREAN SECTION N/A 11/28/2013   Procedure: CESAREAN SECTION;  Surgeon: Betsy Coder, MD;  Location: [facility];  Service: Obstetrics;  Laterality: N/A;  . CESAREAN SECTION      Family History  Problem Relation Age of Onset  . Hypertension Mother   . Diabetes Mellitus II Mother   . CAD Father   . Hyperlipidemia Father   . Hypertension Sister   . Diabetes Mellitus II Sister   . Breast cancer Mother 25  . Heart failure Mother   . Diabetes Sister   . Hypertension Sister   . Thyroid disease Sister   . Anemia Sister   . Anesthesia problems Neg Hx   . Heart disease Father   . Heart attack Father   . Heart murmur Sister   . Heart murmur Daughter     Social History   Socioeconomic History  . Marital status: Married    Spouse name: Not on file  . Number of children: Not on file  . Years of education: Not on file  . Highest education level: Not on file  Occupational History  . Not on file  Social Needs  . Financial resource strain: Not on file  . Food insecurity:    Worry: Not on file    Inability: Not on file  . Transportation needs:    Medical: Not on file    Non-medical: Not on file  Tobacco Use  . Smoking status: Never Smoker  . Smokeless tobacco: Never Used  Substance and Sexual Activity  . Alcohol use: No  . Drug use: No  . Sexual activity: Yes    Birth control/protection: None  Lifestyle  . Physical activity:    Days per week: Not on file    Minutes per session: Not on file  . Stress: Not on file  Relationships  . Social connections:    Talks on phone: Not on file    Gets together: Not on file    Attends religious service: Not on file    Active member of club or organization: Not on file    Attends meetings of clubs or organizations: Not on file    Relationship status: Not on file  . Intimate partner violence:    Fear of current or ex partner: Not on file    Emotionally abused: Not on file    Physically abused: Not  on file    Forced sexual activity: Not on file  Other Topics Concern  . Not on file  Social History Narrative   ** Merged History Encounter **       Lives with husband and 2 children in a one story home.   Works as an Building control surveyor.   Education: college degree.    Outpatient Medications Prior to Visit  Medication Sig Dispense Refill  . acetaminophen (TYLENOL) 500 MG tablet Take 500 mg by mouth every 6 (six) hours as needed for mild pain or headache.    Marland Kitchen amLODipine (NORVASC) 10 MG tablet Take 1 tablet (10 mg total) by mouth daily. 30 tablet 2  . atorvastatin (LIPITOR) 10 MG tablet Take 10 mg by mouth daily.  1  . bisoprolol (ZEBETA) 5 MG tablet Take 1 tablet (5 mg total) by mouth daily. 30 tablet 2  . busPIRone (BUSPAR) 10 MG tablet Take 1 tablet (10 mg total) by mouth 2 (two) times daily. 60 tablet 3  . cyclobenzaprine (FLEXERIL) 10 MG tablet Take 1 tablet (10 mg total) by mouth 3 (three) times daily as needed for muscle spasms. 30 tablet 1  . ferrous sulfate (FERROUSUL) 325 (65 FE) MG tablet Take 1 tablet (325 mg total) by mouth daily with breakfast. 30 tablet 0  . gabapentin (NEURONTIN) 100 MG capsule Take 2 capsule (200 mg total) twice daily. 120 capsule 2  . hydrochlorothiazide (HYDRODIURIL) 25 MG tablet Take 1 tablet (25 mg total) by mouth daily. 30 tablet 2  . ibuprofen (ADVIL,MOTRIN) 800 MG tablet Take 1 tablet (800 mg total) by mouth every 8 (eight) hours as needed for moderate pain. 21 tablet 0  . losartan (COZAAR) 25 MG tablet Take 1 tablet (25 mg total) by mouth daily. 30 tablet 2  . topiramate (TOPAMAX) 25 MG tablet Take 1 tablet (25 mg total) by mouth 2 (two) times daily. 60 tablet 2  . metFORMIN (GLUCOPHAGE-XR) 500 MG 24 hr tablet Take 1 tablet (500 mg total) by mouth daily with breakfast. 90 tablet 3  . aspirin EC 325 MG tablet Take 325 mg by mouth daily.    . blood glucose meter kit and supplies Dispense based on patient and insurance preference. Use up to four times  daily as directed. (FOR ICD-9 250.00, 250.01). 1 each 0  . naproxen (NAPROSYN) 500 MG tablet Take 1 tablet (500 mg total) by mouth 2 (two) times daily with a meal. (Patient not taking: Reported on 12/11/2018) 30 tablet 1   No facility-administered medications prior to visit.     No Known Allergies  ROS Review of Systems  Constitutional: Negative.   HENT: Negative.   Eyes: Negative.   Respiratory: Negative.   Cardiovascular: Negative.   Gastrointestinal: Positive for abdominal distention (Obese) and abdominal pain (mild lower ).  Endocrine: Negative.   Genitourinary: Negative.   Musculoskeletal: Negative.   Skin: Negative.   Allergic/Immunologic: Negative.   Neurological: Positive for dizziness and headaches.  Hematological: Negative.   Psychiatric/Behavioral: The patient is nervous/anxious.     Objective:    Physical Exam  Constitutional: She is oriented to person, place, and time. She appears well-developed and well-nourished.  HENT:  Head: Normocephalic and atraumatic.  Eyes: Conjunctivae are normal.  Neck: Normal range of motion. Neck supple.  Cardiovascular: Normal rate, regular rhythm, normal heart sounds and intact distal pulses.  Pulmonary/Chest: Effort normal and breath sounds normal.  Abdominal: Soft. Bowel sounds are normal. She exhibits distension.  Musculoskeletal: Normal range of motion.        General: Edema (bilateral lower peripheral) present.  Neurological: She is alert and oriented to person, place, and time. She has normal reflexes.  Skin: Skin is warm and dry.  Psychiatric: She has a normal mood and affect. Her behavior is normal. Judgment and thought content normal.  Vitals reviewed.  BP 140/90 (BP Location: Right Arm, Patient Position: Sitting, Cuff Size: Large)   Pulse 84   Temp 97.9 F (36.6 C) (Oral)   Resp 16   Ht 5' 6"  (1.676 m)   Wt (!) 332 lb 3.2 oz (150.7 kg)   SpO2 100%   BMI 53.62 kg/m  Wt Readings from Last 3 Encounters:  12/11/18  (!) 332 lb 3.2 oz (150.7 kg)  11/13/18 (!) 324 lb (147 kg)  11/09/18 (!) 320 lb (145.2 kg)    Health Maintenance Due  Topic Date Due  . PNEUMOCOCCAL POLYSACCHARIDE VACCINE AGE 63-64 HIGH RISK  01/23/1986  . FOOT EXAM  01/23/1994  . OPHTHALMOLOGY EXAM  01/23/1994  . PAP SMEAR-Modifier  06/04/2008  . INFLUENZA VACCINE  04/25/2018    There are no preventive care reminders to display for this patient.  Lab Results  Component Value Date   TSH 2.850 11/13/2018   Lab Results  Component Value Date   WBC 6.0 11/13/2018   HGB 11.5 11/13/2018   HCT 35.4 11/13/2018   MCV 74 (L) 11/13/2018   PLT 341 11/13/2018   Lab Results  Component Value Date   NA 141 08/28/2017   K 4.5 08/28/2017   CO2 22 08/28/2017   GLUCOSE 99 08/28/2017   BUN 8 08/28/2017   CREATININE 0.67 08/28/2017   BILITOT 0.4 05/01/2017   ALKPHOS 91 05/01/2017   AST 22 05/01/2017   ALT 20 05/01/2017   PROT 8.1 05/01/2017   ALBUMIN 3.2 (L) 05/01/2017   CALCIUM 9.2 08/28/2017   ANIONGAP 7 05/03/2017   Lab Results  Component Value Date   CHOL 186 11/13/2018   Lab Results  Component Value Date   HDL 49 11/13/2018   Lab Results  Component Value Date   LDLCALC 114 (H) 11/13/2018   Lab Results  Component Value Date   TRIG 115 11/13/2018   Lab Results  Component Value Date   CHOLHDL 3.8 11/13/2018   Lab Results  Component Value Date   HGBA1C 5.9 (A) 07/17/2018   Assessment & Plan:   1. Class 3 severe obesity due to excess calories with serious comorbidity and body mass index (BMI) of 50.0 to 59.9 in adult [facility]) Her weight is stable today. Body mass index is 53.62 kg/m.  Goal BMI  is <30. Encouraged efforts to reduce weight include engaging in physical activity as tolerated with goal of 150 minutes per week. Improve dietary choices and eat a meal regimen consistent with a Mediterranean or DASH diet. Reduce simple carbohydrates. Do not skip meals and eat healthy snacks throughout the day to avoid  over-eating at dinner. Set a goal weight loss that is achievable for you.  2. Anxiety Mild today. She will continue Buspar as prescribed.   3. Pelvic pain Stable today. No signs or symptoms of recurrence noted or reported.   4. Type 2 diabetes mellitus without complication, without long-term current use of insulin (HCC) - metFORMIN (GLUCOPHAGE-XR) 500 MG 24 hr tablet; Take 1 tablet (500 mg total) by mouth daily with breakfast.  Dispense: 90 tablet; Refill: 2  5. Essential hypertension Blood pressure is stable at 140/90 today. She will continue Amlodipine and HCTZ as prescribed. She will continue to decrease high sodium intake, excessive alcohol intake, increase potassium intake, smoking cessation, and increase physical activity of at least 30 minutes of cardio activity daily. She will continue to follow Heart Healthy or DASH diet.  6. Peripheral edema We will initiate Lasix today.  - furosemide (LASIX) 20 MG tablet; Take 1 tablet (20 mg total) by mouth daily.  Dispense: 30 tablet; Refill: 2  7. Follow up She will follow up in 1 month. We will re-assess K+ level at that time.   Meds ordered this encounter  Medications  . metFORMIN (GLUCOPHAGE-XR) 500 MG 24 hr tablet    Sig: Take 1 tablet (500 mg total) by mouth daily with breakfast.    Dispense:  90 tablet    Refill:  2  . furosemide (LASIX) 20 MG tablet    Sig: Take 1 tablet (20 mg total) by mouth daily.    Dispense:  30 tablet    Refill:  2    No orders of the defined types were placed in this encounter.  Referral Orders  No referral(s) requested today    Kathe Becton,  MSN, FNP-C Patient [facility], [place] [address] [phone]   Problem List Items Addressed This Visit      Cardiovascular and Mediastinum   Essential hypertension   Relevant Medications   furosemide (LASIX) 20 MG tablet     Endocrine   Type 2 diabetes mellitus without complication (HCC)    Relevant Medications   metFORMIN (GLUCOPHAGE-XR) 500 MG 24 hr tablet    Other Visit Diagnoses    Class 3 severe obesity due to excess calories with serious comorbidity and body mass index (BMI) of 50.0 to 59.9 in adult [facility])    -  Primary   Relevant Medications   metFORMIN (GLUCOPHAGE-XR) 500 MG 24 hr tablet   Anxiety       Pelvic pain       Peripheral edema       Relevant Medications   furosemide (LASIX) 20 MG tablet   Follow up          Meds ordered this encounter  Medications  . metFORMIN (GLUCOPHAGE-XR) 500 MG 24 hr tablet    Sig: Take 1 tablet (500 mg total) by mouth daily with breakfast.    Dispense:  90 tablet    Refill:  2  . furosemide (LASIX) 20 MG tablet    Sig: Take 1 tablet (20 mg total) by mouth daily.    Dispense:  30 tablet    Refill:  2    Follow-up: Return in about 1 month (around 01/11/2019).    Azzie Glatter, FNP

## 2018-12-14 ENCOUNTER — Telehealth: Payer: Self-pay | Admitting: Family Medicine

## 2018-12-14 NOTE — Telephone Encounter (Signed)
Spoke to patient in response to message from on-call service. She states that she awoke today with a low-grade fever of 99.5 and a mild cough. Since then she has not taken her temperature. She denies seasonal allergies. She denies shortness of breath, chest pain, and difficulty breathing. She recently traveled to [place], [place] on 11/25/2018-11/27/2018. She denies any contact with anyone with cold/flu symptoms. She has been taking Theraflu for relief of her symptoms. She has increased her fluids. She is advised to monitor her temperatures, continue Theraflu, increasing fluids, and use Motrin/Acetaminophen as needed. She is to report if symptoms do not improve or worsen. Patient verbalized understanding.

## 2019-01-13 ENCOUNTER — Other Ambulatory Visit: Payer: Self-pay

## 2019-01-13 ENCOUNTER — Ambulatory Visit (INDEPENDENT_AMBULATORY_CARE_PROVIDER_SITE_OTHER): Payer: Medicaid Other | Admitting: Family Medicine

## 2019-01-13 ENCOUNTER — Ambulatory Visit: Payer: Medicaid Other | Admitting: Registered"

## 2019-01-13 DIAGNOSIS — Z6841 Body Mass Index (BMI) 40.0 and over, adult: Secondary | ICD-10-CM | POA: Diagnosis not present

## 2019-01-13 DIAGNOSIS — E119 Type 2 diabetes mellitus without complications: Secondary | ICD-10-CM

## 2019-01-13 DIAGNOSIS — Z09 Encounter for follow-up examination after completed treatment for conditions other than malignant neoplasm: Secondary | ICD-10-CM

## 2019-01-13 DIAGNOSIS — I10 Essential (primary) hypertension: Secondary | ICD-10-CM | POA: Diagnosis not present

## 2019-01-13 DIAGNOSIS — Z20822 Contact with and (suspected) exposure to covid-19: Secondary | ICD-10-CM

## 2019-01-13 DIAGNOSIS — E66813 Obesity, class 3: Secondary | ICD-10-CM

## 2019-01-13 DIAGNOSIS — Z20828 Contact with and (suspected) exposure to other viral communicable diseases: Secondary | ICD-10-CM | POA: Diagnosis not present

## 2019-01-13 NOTE — Progress Notes (Signed)
Virtual Visit via Telephone Note  I connected with Autumn [name] on 01/13/19 at  3:00 PM EDT by telephone and verified that I am speaking with the correct person using two identifiers.   I discussed the limitations, risks, security and privacy concerns of performing an evaluation and management service by telephone and the availability of in person appointments. I also discussed with the patient that there may be a patient responsible charge related to this service. The patient expressed understanding and agreed to proceed.  History of Present Illness:  Past Medical History:  Diagnosis Date  . Essential hypertension   . H/O varicella   . Headache   . Heart murmur   . Hypertension    stopped meds since pregnant  . Leaky heart valve    mild MR by echo  . Obese    Current Outpatient Medications on File Prior to Visit  Medication Sig Dispense Refill  . acetaminophen (TYLENOL) 500 MG tablet Take 500 mg by mouth every 6 (six) hours as needed for mild pain or headache.    Marland Kitchen amLODipine (NORVASC) 10 MG tablet Take 1 tablet (10 mg total) by mouth daily. 30 tablet 2  . aspirin EC 325 MG tablet Take 325 mg by mouth daily.    Marland Kitchen atorvastatin (LIPITOR) 10 MG tablet Take 10 mg by mouth daily.  1  . bisoprolol (ZEBETA) 5 MG tablet Take 1 tablet (5 mg total) by mouth daily. 30 tablet 2  . blood glucose meter kit and supplies Dispense based on patient and insurance preference. Use up to four times daily as directed. (FOR ICD-9 250.00, 250.01). 1 each 0  . busPIRone (BUSPAR) 10 MG tablet Take 1 tablet (10 mg total) by mouth 2 (two) times daily. 60 tablet 3  . cyclobenzaprine (FLEXERIL) 10 MG tablet Take 1 tablet (10 mg total) by mouth 3 (three) times daily as needed for muscle spasms. 30 tablet 1  . ferrous sulfate (FERROUSUL) 325 (65 FE) MG tablet Take 1 tablet (325 mg total) by mouth daily with breakfast. 30 tablet 0  . furosemide (LASIX) 20 MG tablet Take 1 tablet (20 mg total) by mouth daily.  30 tablet 2  . gabapentin (NEURONTIN) 100 MG capsule Take 2 capsule (200 mg total) twice daily. 120 capsule 2  . hydrochlorothiazide (HYDRODIURIL) 25 MG tablet Take 1 tablet (25 mg total) by mouth daily. 30 tablet 2  . ibuprofen (ADVIL,MOTRIN) 800 MG tablet Take 1 tablet (800 mg total) by mouth every 8 (eight) hours as needed for moderate pain. 21 tablet 0  . losartan (COZAAR) 25 MG tablet Take 1 tablet (25 mg total) by mouth daily. 30 tablet 2  . metFORMIN (GLUCOPHAGE-XR) 500 MG 24 hr tablet Take 1 tablet (500 mg total) by mouth daily with breakfast. 90 tablet 2  . naproxen (NAPROSYN) 500 MG tablet Take 1 tablet (500 mg total) by mouth 2 (two) times daily with a meal. (Patient not taking: Reported on 12/11/2018) 30 tablet 1  . topiramate (TOPAMAX) 25 MG tablet Take 1 tablet (25 mg total) by mouth 2 (two) times daily. 60 tablet 2   No current facility-administered medications on file prior to visit.    Current Status: Since her last office visit, she reports cough, no smell, decreased since of taste X 3 weeks now. On 01/10/2019 she had trouble breathing. Her sister was recently diagnosed with [place]. Her sister was admitted to hospital from 01/02/2019-01/05/2019, and she received positive results of [place] on 01/07/2019 and has  been at home on self-quarintine. She admits to have been within social contact with her sister prior to her hospital admission. She states that she has been at home since then, but she continues to have c/o cough and shortness of breath. She states that her home blood pressures have been increased. Her most recent blood pressure has been 168/102. She denies visual changes, chest pain, cough, shortness of breath, heart palpitations, and falls. She has occasional headaches and dizziness with position changes. Denies severe headaches, confusion, seizures, double vision, and blurred vision, nausea and vomiting. She states that she has had increase stress lately, which she r/t increased  blood pressure readings. She denies suicidal ideations, homicidal ideations, or auditory hallucinations.  She denies fevers, chills, fatigue, recent infections, weight loss, and night sweats. No reports of GI problems such as diarrhea, and constipation. She has no reports of blood in stools, dysuria and hematuria. She denies pain today.   Observations/Objective:  Telephone Virtual Visit   Kathe Becton,  MSN, FNP-BC Patient Autumn [name], [place] [phone]  Assessment and Plan: She is to report home blood pressure readings to office tomorrow.  Advised patient to get trested for [place]. Make follow up appointment.     No orders of the defined types were placed in this encounter.  No orders of the defined types were placed in this encounter.   Referral Orders  No referral(s) requested today    Follow Up Instructions:  I discussed the assessment and treatment plan with the patient. The patient was provided an opportunity to ask questions and all were answered. The patient agreed with the plan and demonstrated an understanding of the instructions.   The patient was advised to call back or seek an in-person evaluation if the symptoms worsen or if the condition fails to improve as anticipated.  I provided 20 minutes of non-face-to-face time during this encounter.   Azzie Glatter, FNP

## 2019-01-14 ENCOUNTER — Emergency Department (HOSPITAL_COMMUNITY)
Admission: EM | Admit: 2019-01-14 | Discharge: 2019-01-14 | Disposition: A | Discharge status: Home / Self Care | Payer: Medicaid Other | Attending: Emergency Medicine | Admitting: Emergency Medicine

## 2019-01-14 ENCOUNTER — Encounter (HOSPITAL_COMMUNITY): Payer: Self-pay

## 2019-01-14 ENCOUNTER — Other Ambulatory Visit: Payer: Self-pay

## 2019-01-14 ENCOUNTER — Emergency Department (HOSPITAL_COMMUNITY): Payer: Medicaid Other

## 2019-01-14 DIAGNOSIS — Z20828 Contact with and (suspected) exposure to other viral communicable diseases: Secondary | ICD-10-CM | POA: Insufficient documentation

## 2019-01-14 DIAGNOSIS — I10 Essential (primary) hypertension: Secondary | ICD-10-CM | POA: Insufficient documentation

## 2019-01-14 DIAGNOSIS — E119 Type 2 diabetes mellitus without complications: Secondary | ICD-10-CM | POA: Insufficient documentation

## 2019-01-14 DIAGNOSIS — R6889 Other general symptoms and signs: Secondary | ICD-10-CM

## 2019-01-14 DIAGNOSIS — Z7982 Long term (current) use of aspirin: Secondary | ICD-10-CM | POA: Insufficient documentation

## 2019-01-14 DIAGNOSIS — R0602 Shortness of breath: Secondary | ICD-10-CM | POA: Insufficient documentation

## 2019-01-14 DIAGNOSIS — Z7984 Long term (current) use of oral hypoglycemic drugs: Secondary | ICD-10-CM | POA: Insufficient documentation

## 2019-01-14 DIAGNOSIS — Z20822 Contact with and (suspected) exposure to covid-19: Secondary | ICD-10-CM

## 2019-01-14 DIAGNOSIS — Z79899 Other long term (current) drug therapy: Secondary | ICD-10-CM | POA: Insufficient documentation

## 2019-01-14 DIAGNOSIS — R05 Cough: Secondary | ICD-10-CM | POA: Diagnosis not present

## 2019-01-14 MED ORDER — BENZONATATE 100 MG PO CAPS: 100.0000 mg | ORAL_CAPSULE | Freq: Three times a day (TID) | ORAL | 0 refills | Status: DC

## 2019-01-14 NOTE — Discharge Instructions (Addendum)
At this time we do not have the capacity to do a coronavirus test on you.  However, you have symptoms and have been exposed to it.  Therefore we should presume that you have the coronavirus and treat you as if you have it. There is no specific medicine for coronavirus. I will give you a prescription for some cough medication. Please RETURN TO THE ER if you have trouble breathing.   Your chest xray was normal  Please follow these instructions and self quarantine for at least 7 days from your symptom onset AND until you are free of fever for 72 hours  Stay home except to get medical care You should restrict activities outside your home, except for getting medical care. Do not go to work, school, or public areas, and do not use public transportation or taxis.  Call ahead before visiting your doctor Before your medical appointment, call the healthcare provider and tell them that you have viral illness symptoms  Monitor your symptoms Seek prompt medical attention if your illness is worsening (e.g., difficulty breathing).   Wear a facemask You should wear a facemask that covers your nose and mouth when you are in the same room with other people and when you visit a healthcare provider. People who live with or visit you should also wear a facemask while they are in the same room with you.  Separate yourself from other people in your home As much as possible, you should stay in a different room from other people in your home. Also, you should use a separate bathroom, if available.  Avoid sharing household items You should not share dishes, drinking glasses, cups, eating utensils, towels, bedding, or other items with other people in your home. After using these items, you should wash them thoroughly with soap and water.  Cover your coughs and sneezes Cover your mouth and nose with a tissue when you cough or sneeze, or you can cough or sneeze into your sleeve. Throw used tissues in a lined trash can,  and immediately wash your hands with soap and water for at least 20 seconds or use an alcohol-based hand rub.  Wash your [employer] your hands often and thoroughly with soap and water for at least 20 seconds. You can use an alcohol-based hand sanitizer if soap and water are not available and if your hands are not visibly dirty. Avoid touching your eyes, nose, and mouth with unwashed hands.

## 2019-01-14 NOTE — ED Provider Notes (Signed)
[facility] EMERGENCY DEPARTMENT Provider Note   CSN: [id number] Arrival date & time: 01/14/19  1943    History   Chief Complaint Chief Complaint  Patient presents with  . Cough  . Shortness of Breath    HPI Autumn [name] is a 33 y.o. female.     Patient is a 33-year-old female with past medical history of hypertension, diabetes, obesity who presents emergency department for cough and shortness of breath.  Patient reports that she had flulike symptoms a couple of weeks ago and was treated for the flu by her primary care doctor.  Reports that the symptoms got a little bit better but then worsened Thursday.  Reports that she was in contact with her sister couple weeks ago who tested positive for COVID-19.  Patient is concerned she may have the same thing.  She has not taken any medication recently.  She denies any leg swelling, chest pain, nausea, vomiting.  Reports that she does have loss of taste and loss of smell.  Denies any fever.  Is worse when she is up and taking walks during the day.     Past Medical History:  Diagnosis Date  . Essential hypertension   . H/O varicella   . Headache   . Heart murmur   . Hypertension    stopped meds since pregnant  . Leaky heart valve    mild MR by echo  . Obese     Patient Active Problem List   Diagnosis Date Noted  . Anxiety 12/11/2018  . Peripheral edema 12/11/2018  . Type 2 diabetes mellitus without complication (HCC) 05/03/2017  . Complicated migraine 05/02/2017  . Hypertensive urgency 05/02/2017  . Normocytic anemia 05/02/2017  . Left-sided weakness 05/02/2017  . Essential hypertension   . Myofascial pain 01/13/2016  . Weakness 01/13/2016  . Pain in limb 01/13/2016  . Chest pain at rest 12/14/2015  . Preeclampsia complicating hypertension 11/29/2013  . Chronic hypertension with superimposed preeclampsia 11/28/2013  . Positive GBS test 11/28/2013  . Chronic headaches 11/28/2013  . Leaky  heart valve 05/12/2012  . Morbid obesity (HCC) 01/22/2012    Past Surgical History:  Procedure Laterality Date  . BILATERAL SALPINGECTOMY Bilateral 11/28/2013   Procedure: BILATERAL SALPINGECTOMY;  Surgeon: Naima A Dillard, MD;  Location: [facility] ORS;  Service: Obstetrics;  Laterality: Bilateral;  Partial on Left  . CESAREAN SECTION  05/14/2012   Procedure: CESAREAN SECTION;  Surgeon: Vanessa P Haygood, MD;  Location: [facility] ORS;  Service: Gynecology;  Laterality: N/A;  Primary cesarean section of baby girl    at 0533 APGAR 8/9  . CESAREAN SECTION N/A 11/28/2013   Procedure: CESAREAN SECTION;  Surgeon: Naima A Dillard, MD;  Location: [facility] ORS;  Service: Obstetrics;  Laterality: N/A;  . CESAREAN SECTION       OB History    Gravida  2   Para  2   Term  2   Preterm  0   AB  0   Living  2     SAB  0   TAB  0   Ectopic  0   Multiple      Live Births  2            Home Medications    Prior to Admission medications   Medication Sig Start Date End Date Taking? Authorizing Provider  acetaminophen (TYLENOL) 500 MG tablet Take 500 mg by mouth every 6 (six) hours as needed for mild pain or headache.      [provider]  amLODipine (NORVASC) 10 MG tablet Take 1 tablet (10 mg total) by mouth daily. 09/02/18   Stroud, Natalie M, FNP  aspirin EC 325 MG tablet Take 325 mg by mouth daily.    [provider]  atorvastatin (LIPITOR) 10 MG tablet Take 10 mg by mouth daily. 10/23/15   [provider]  benzonatate (TESSALON) 100 MG capsule Take 1 capsule (100 mg total) by mouth every 8 (eight) hours. 01/14/19   McLean, Kelly A, PA-C  bisoprolol (ZEBETA) 5 MG tablet Take 1 tablet (5 mg total) by mouth daily. 09/02/18   Stroud, Natalie M, FNP  blood glucose meter kit and supplies Dispense based on patient and insurance preference. Use up to four times daily as directed. (FOR ICD-9 250.00, 250.01). 05/03/17   Joseph, Preetha, MD  busPIRone (BUSPAR) 10 MG tablet Take 1 tablet (10 mg  total) by mouth 2 (two) times daily. 11/13/18   Stroud, Natalie M, FNP  cyclobenzaprine (FLEXERIL) 10 MG tablet Take 1 tablet (10 mg total) by mouth 3 (three) times daily as needed for muscle spasms. 11/13/18   Stroud, Natalie M, FNP  ferrous sulfate (FERROUSUL) 325 (65 FE) MG tablet Take 1 tablet (325 mg total) by mouth daily with breakfast. 05/03/17   Joseph, Preetha, MD  furosemide (LASIX) 20 MG tablet Take 1 tablet (20 mg total) by mouth daily. 12/11/18   Stroud, Natalie M, FNP  gabapentin (NEURONTIN) 100 MG capsule Take 2 capsule (200 mg total) twice daily. 09/02/18   Stroud, Natalie M, FNP  hydrochlorothiazide (HYDRODIURIL) 25 MG tablet Take 1 tablet (25 mg total) by mouth daily. 09/02/18   Stroud, Natalie M, FNP  ibuprofen (ADVIL,MOTRIN) 800 MG tablet Take 1 tablet (800 mg total) by mouth every 8 (eight) hours as needed for moderate pain. 11/09/18   Long, Joshua G, MD  losartan (COZAAR) 25 MG tablet Take 1 tablet (25 mg total) by mouth daily. 09/02/18   Stroud, Natalie M, FNP  metFORMIN (GLUCOPHAGE-XR) 500 MG 24 hr tablet Take 1 tablet (500 mg total) by mouth daily with breakfast. 12/11/18   Stroud, Natalie M, FNP  naproxen (NAPROSYN) 500 MG tablet Take 1 tablet (500 mg total) by mouth 2 (two) times daily with a meal. Patient not taking: Reported on 12/11/2018 11/13/18   Stroud, Natalie M, FNP  topiramate (TOPAMAX) 25 MG tablet Take 1 tablet (25 mg total) by mouth 2 (two) times daily. 07/17/18   Stroud, Natalie M, FNP    Family History Family History  Problem Relation Age of Onset  . Hypertension Mother   . Diabetes Mellitus II Mother   . CAD Father   . Hyperlipidemia Father   . Hypertension Sister   . Diabetes Mellitus II Sister   . Breast cancer Mother 50  . Heart failure Mother   . Diabetes Sister   . Hypertension Sister   . Thyroid disease Sister   . Anemia Sister   . Anesthesia problems Neg Hx   . Heart disease Father   . Heart attack Father   . Heart murmur Sister   . Heart murmur  Daughter     Social History Social History   Tobacco Use  . Smoking status: Never Smoker  . Smokeless tobacco: Never Used  Substance Use Topics  . Alcohol use: No  . Drug use: No     Allergies   Patient has no known allergies.   Review of Systems Review of Systems  Constitutional: Negative for chills, diaphoresis, fatigue   and fever.  HENT: Positive for congestion. Negative for ear pain, rhinorrhea, sneezing and sore throat.   Eyes: Negative for pain and visual disturbance.  Respiratory: Positive for cough and shortness of breath. Negative for chest tightness.   Cardiovascular: Negative for chest pain, palpitations and leg swelling.  Gastrointestinal: Negative for abdominal pain, nausea and vomiting.  Genitourinary: Negative for dysuria and hematuria.  Musculoskeletal: Negative for arthralgias and back pain.  Skin: Negative for color change and rash.  Neurological: Negative for dizziness, seizures, syncope, light-headedness and headaches.  All other systems reviewed and are negative.    Physical Exam Updated Vital Signs BP (!) 146/99 (BP Location: Right Arm)   Pulse 92   Temp 99.1 F (37.3 C) (Oral)   Resp 18   SpO2 100%   Physical Exam Vitals signs and nursing note reviewed.  Constitutional:      Appearance: Normal appearance.  HENT:     Head: Normocephalic.     Mouth/Throat:     Mouth: Mucous membranes are moist.  Eyes:     Conjunctiva/sclera: Conjunctivae normal.  Cardiovascular:     Rate and Rhythm: Normal rate.  Pulmonary:     Effort: Pulmonary effort is normal.     Breath sounds: Normal breath sounds.  Skin:    General: Skin is dry.  Neurological:     Mental Status: She is alert.  Psychiatric:        Mood and Affect: Mood normal.      ED Treatments / Results  Labs (all labs ordered are listed, but only abnormal results are displayed) Labs Reviewed - No data to display  EKG None  Radiology Dg Chest Portable 1 View  Result Date:  01/14/2019 CLINICAL DATA:  Cough EXAM: PORTABLE CHEST 1 VIEW COMPARISON:  02/02/2016 FINDINGS: The heart size and mediastinal contours are within normal limits. Both lungs are clear. The visualized skeletal structures are unremarkable. IMPRESSION: No active disease. Electronically Signed   By: Kevin  Herman M.D.   On: 01/14/2019 20:45    Procedures Procedures (including critical care time)  Medications Ordered in ED Medications - No data to display   Initial Impression / Assessment and Plan / ED Course  I have reviewed the triage vital signs and the nursing notes.  Pertinent labs & imaging results that were available during my care of the patient were reviewed by me and considered in my medical decision making (see chart for details).          Final Clinical Impressions(s) / ED Diagnoses   Final diagnoses:  Close Exposure to 2019 Novel Coronavirus  Suspected 2019 Novel Coronavirus Infection    ED Discharge Orders         Ordered    benzonatate (TESSALON) 100 MG capsule  Every 8 hours     01/14/19 2108           McLean, Kelly A, PA-C 01/14/19 2142    Little, Rachel Morgan, MD 01/15/19 0038  

## 2019-01-14 NOTE — ED Triage Notes (Signed)
Pt repots she was exposed to her sister who tested postive for COVID 19, pt requesting to be tested. Pt reports cough and intermittent SOB that started last Thursday. Low grade fever 3 weeks ago, treated for the flu by PCP. Pt a.o, nad noted.

## 2019-01-20 ENCOUNTER — Telehealth: Payer: Self-pay

## 2019-01-20 NOTE — Telephone Encounter (Signed)
Patient went to ED to go get tested for the COV-19 and they were out of test so they sent her home to quarantine for 7 days. Tomorrow will be the 7 day and she feeling better but still having the cough, sob,headache, and can't smell anything. Patient would like to if she should quarantine for 7 more days and want to know what she needs to do for he headaches because the medication is not working. Patient was advise to go to ED if symptoms get worse. Patient is aware that you are out of office until next week  Tuesday 4/21-183/110,  Tuesday afternoon-143/100

## 2019-01-21 NOTE — Telephone Encounter (Signed)
Left a vm for patient to callback 

## 2019-01-22 NOTE — Telephone Encounter (Signed)
Patient has been notified and has been taking her medication as prescribed. She will keep a log to report to [place] for telephone appointment on 5/5.

## 2019-01-22 NOTE — Telephone Encounter (Signed)
Patient will report to ED if blood pressure gets worse

## 2019-01-28 ENCOUNTER — Ambulatory Visit (INDEPENDENT_AMBULATORY_CARE_PROVIDER_SITE_OTHER): Payer: Medicaid Other | Admitting: Family Medicine

## 2019-01-28 ENCOUNTER — Other Ambulatory Visit: Payer: Self-pay

## 2019-01-28 DIAGNOSIS — F419 Anxiety disorder, unspecified: Secondary | ICD-10-CM

## 2019-01-28 DIAGNOSIS — R609 Edema, unspecified: Secondary | ICD-10-CM

## 2019-01-28 DIAGNOSIS — Z6841 Body Mass Index (BMI) 40.0 and over, adult: Secondary | ICD-10-CM | POA: Diagnosis not present

## 2019-01-28 DIAGNOSIS — I10 Essential (primary) hypertension: Secondary | ICD-10-CM | POA: Diagnosis not present

## 2019-01-28 DIAGNOSIS — E119 Type 2 diabetes mellitus without complications: Secondary | ICD-10-CM | POA: Diagnosis not present

## 2019-01-28 DIAGNOSIS — E66813 Obesity, class 3: Secondary | ICD-10-CM

## 2019-01-28 DIAGNOSIS — R600 Localized edema: Secondary | ICD-10-CM

## 2019-01-28 NOTE — Progress Notes (Signed)
Virtual Visit via Telephone Note  I connected with Autumn [name] on 01/28/19 at  8:20 AM EDT by telephone and verified that I am speaking with the correct person using two identifiers.   I discussed the limitations, risks, security and privacy concerns of performing an evaluation and management service by telephone and the availability of in person appointments. I also discussed with the patient that there may be a patient responsible charge related to this service. The patient expressed understanding and agreed to proceed.   History of Present Illness:  Past Medical History:  Diagnosis Date  . Essential hypertension   . H/O varicella   . Headache   . Heart murmur   . Hypertension    stopped meds since pregnant  . Leaky heart valve    mild MR by echo  . Obese     Current Outpatient Medications on File Prior to Visit  Medication Sig Dispense Refill  . acetaminophen (TYLENOL) 500 MG tablet Take 500 mg by mouth every 6 (six) hours as needed for mild pain or headache.    Marland Kitchen amLODipine (NORVASC) 10 MG tablet Take 1 tablet (10 mg total) by mouth daily. 30 tablet 2  . aspirin EC 325 MG tablet Take 325 mg by mouth daily.    Marland Kitchen atorvastatin (LIPITOR) 10 MG tablet Take 10 mg by mouth daily.  1  . bisoprolol (ZEBETA) 5 MG tablet Take 1 tablet (5 mg total) by mouth daily. 30 tablet 2  . blood glucose meter kit and supplies Dispense based on patient and insurance preference. Use up to four times daily as directed. (FOR ICD-9 250.00, 250.01). 1 each 0  . busPIRone (BUSPAR) 10 MG tablet Take 1 tablet (10 mg total) by mouth 2 (two) times daily. 60 tablet 3  . cyclobenzaprine (FLEXERIL) 10 MG tablet Take 1 tablet (10 mg total) by mouth 3 (three) times daily as needed for muscle spasms. 30 tablet 1  . ferrous sulfate (FERROUSUL) 325 (65 FE) MG tablet Take 1 tablet (325 mg total) by mouth daily with breakfast. 30 tablet 0  . furosemide (LASIX) 20 MG tablet Take 1 tablet (20 mg total) by mouth  daily. 30 tablet 2  . gabapentin (NEURONTIN) 100 MG capsule Take 2 capsule (200 mg total) twice daily. 120 capsule 2  . hydrochlorothiazide (HYDRODIURIL) 25 MG tablet Take 1 tablet (25 mg total) by mouth daily. 30 tablet 2  . ibuprofen (ADVIL,MOTRIN) 800 MG tablet Take 1 tablet (800 mg total) by mouth every 8 (eight) hours as needed for moderate pain. 21 tablet 0  . losartan (COZAAR) 25 MG tablet Take 1 tablet (25 mg total) by mouth daily. 30 tablet 2  . metFORMIN (GLUCOPHAGE-XR) 500 MG 24 hr tablet Take 1 tablet (500 mg total) by mouth daily with breakfast. 90 tablet 2  . topiramate (TOPAMAX) 25 MG tablet Take 1 tablet (25 mg total) by mouth 2 (two) times daily. 60 tablet 2   No current facility-administered medications on file prior to visit.     Current Status: Since her last office visit, she is doing well with no complaints. Her most recent blood pressure readings have been around 150's/90's. She denies visual changes, chest pain, cough, shortness of breath, heart palpitations, and falls. She has occasional headaches and dizziness with position changes. Denies severe headaches, confusion, seizures, double vision, and blurred vision, nausea and vomiting. She denies fatigue, frequent urination, blurred vision, excessive hunger, excessive thirst, weight gain, weight loss, and poor wound healing. She  denies suicidal ideations, homicidal ideations, or auditory hallucinations. She denies fevers, chills, recent infections, weight loss, and night sweats. No reports of GI problems such as diarrhea, and constipation. She has no reports of blood in stools, dysuria and hematuria. She denies pain today.    Observations/Objective:  Telephone Virtual Visit   Assessment and Plan:  1. Class 3 severe obesity due to excess calories with serious comorbidity and body mass index (BMI) of 50.0 to 59.9 in adult (HCC) Goal BMI  is <30. Encouraged efforts to reduce weight include engaging in physical activity as  tolerated with goal of 150 minutes per week. Improve dietary choices and eat a meal regimen consistent with a Mediterranean or DASH diet. Reduce simple carbohydrates. Do not skip meals and eat healthy snacks throughout the day to avoid over-eating at dinner. Set a goal weight loss that is achievable for you.  2. Type 2 diabetes mellitus without complication, without long-term current use of insulin (HCC) Continue medications as prescribed.  She will continue to decrease foods/beverages high in sugars and carbs and follow Heart Healthy or DASH diet. Increase physical activity to at least 30 minutes cardio exercise daily.   3. Essential hypertension She will continue to decrease high sodium intake, excessive alcohol intake, increase potassium intake, smoking cessation, and increase physical activity of at least 30 minutes of cardio activity daily. She will continue to follow Heart Healthy or DASH diet.  4. Anxiety Stable.   5. Peripheral edema Stable. Continue Lasix as prescribed.   No orders of the defined types were placed in this encounter.   No orders of the defined types were placed in this encounter.   Referral Orders  No referral(s) requested today   Kathe Becton,  MSN, FNP-C Patient [place] [address] [place], [place] [address] [phone]   Follow Up Instructions:  She will follow up in 6 months.    I discussed the assessment and treatment plan with the patient. The patient was provided an opportunity to ask questions and all were answered. The patient agreed with the plan and demonstrated an understanding of the instructions.   The patient was advised to call back or seek an in-person evaluation if the symptoms worsen or if the condition fails to improve as anticipated.  I provided 15 minutes of non-face-to-face time during this encounter.   Azzie Glatter, FNP

## 2019-03-07 DIAGNOSIS — Z6841 Body Mass Index (BMI) 40.0 and over, adult: Secondary | ICD-10-CM | POA: Insufficient documentation

## 2019-03-07 DIAGNOSIS — E66813 Obesity, class 3: Secondary | ICD-10-CM | POA: Insufficient documentation

## 2019-06-23 ENCOUNTER — Other Ambulatory Visit: Payer: Self-pay

## 2019-06-23 DIAGNOSIS — Z20822 Contact with and (suspected) exposure to covid-19: Secondary | ICD-10-CM

## 2019-06-23 DIAGNOSIS — R6889 Other general symptoms and signs: Secondary | ICD-10-CM | POA: Diagnosis not present

## 2019-06-24 LAB — NOVEL CORONAVIRUS, NAA: SARS-CoV-2, NAA: NOT DETECTED

## 2019-06-25 ENCOUNTER — Telehealth: Payer: Self-pay | Admitting: General Practice

## 2019-06-25 NOTE — Telephone Encounter (Signed)
Negative COVID results given. Patient results "NOT Detected." Caller expressed understanding. ° °

## 2019-07-31 ENCOUNTER — Ambulatory Visit: Payer: Medicaid Other | Admitting: Family Medicine

## 2019-08-12 ENCOUNTER — Encounter: Payer: Self-pay | Admitting: Family Medicine

## 2019-08-12 ENCOUNTER — Other Ambulatory Visit: Payer: Self-pay

## 2019-08-12 ENCOUNTER — Ambulatory Visit (INDEPENDENT_AMBULATORY_CARE_PROVIDER_SITE_OTHER): Payer: Medicaid Other | Admitting: Family Medicine

## 2019-08-12 VITALS — BP 159/89 | HR 61 | Temp 98.3°F | Ht 66.0 in | Wt 343.6 lb

## 2019-08-12 DIAGNOSIS — I10 Essential (primary) hypertension: Secondary | ICD-10-CM | POA: Diagnosis not present

## 2019-08-12 DIAGNOSIS — F419 Anxiety disorder, unspecified: Secondary | ICD-10-CM

## 2019-08-12 DIAGNOSIS — E119 Type 2 diabetes mellitus without complications: Secondary | ICD-10-CM

## 2019-08-12 DIAGNOSIS — R600 Localized edema: Secondary | ICD-10-CM

## 2019-08-12 DIAGNOSIS — R7309 Other abnormal glucose: Secondary | ICD-10-CM

## 2019-08-12 DIAGNOSIS — Z202 Contact with and (suspected) exposure to infections with a predominantly sexual mode of transmission: Secondary | ICD-10-CM

## 2019-08-12 DIAGNOSIS — Z09 Encounter for follow-up examination after completed treatment for conditions other than malignant neoplasm: Secondary | ICD-10-CM

## 2019-08-12 DIAGNOSIS — Z6841 Body Mass Index (BMI) 40.0 and over, adult: Secondary | ICD-10-CM | POA: Diagnosis not present

## 2019-08-12 DIAGNOSIS — E66813 Obesity, class 3: Secondary | ICD-10-CM

## 2019-08-12 DIAGNOSIS — Z2009 Contact with and (suspected) exposure to other intestinal infectious diseases: Secondary | ICD-10-CM | POA: Diagnosis not present

## 2019-08-12 DIAGNOSIS — R609 Edema, unspecified: Secondary | ICD-10-CM

## 2019-08-12 LAB — GLUCOSE, POCT (MANUAL RESULT ENTRY): POC Glucose: 97 mg/dl (ref 70–99)

## 2019-08-12 LAB — POCT URINALYSIS DIPSTICK
Bilirubin, UA: NEGATIVE
Blood, UA: NEGATIVE
Glucose, UA: NEGATIVE
Ketones, UA: NEGATIVE
Leukocytes, UA: NEGATIVE
Nitrite, UA: NEGATIVE
Protein, UA: NEGATIVE
Spec Grav, UA: >=1.03 — AB (ref 1.010–1.025)
Urobilinogen, UA: 1 E.U./dL
pH, UA: 6 (ref 5.0–8.0)

## 2019-08-12 LAB — POCT GLYCOSYLATED HEMOGLOBIN (HGB A1C): Hemoglobin A1C: 6.5 % — AB (ref 4.0–5.6)

## 2019-08-12 MED ORDER — TRULICITY 0.75 MG/0.5ML ~~LOC~~ SOAJ: 0.7500 mg | SUBCUTANEOUS | 3 refills | Status: DC

## 2019-08-12 MED ORDER — METFORMIN HCL 500 MG PO TABS: 500.0000 mg | ORAL_TABLET | Freq: Every day | ORAL | 3 refills | Status: DC

## 2019-08-12 NOTE — Progress Notes (Signed)
[facility]   Established Patient Office Visit  Subjective:  Patient ID: Autumn [name], female    DOB: 29-Jul-1986  Age: 33 y.o. MRN: [id number]  CC:  Chief Complaint  Patient presents with  . Follow-up    6 mth follow up dm & htn  . Leg Pain    both legs, hurts to touch legs, leg aches- going on for years   . Knee Pain    left knee pain for 3 months  . Referral    Need new referral to [facility]    HPI Autumn [name] is a 33 year old female who presents for Follow Up today.   Past Medical History:  Diagnosis Date  . Essential hypertension   . H/O varicella   . Headache   . Heart murmur   . Hypertension    stopped meds since pregnant  . Leaky heart valve    mild MR by echo  . Obese    Current Status: Since her last office visit, she is doing well. She denies visual changes, chest pain, cough, shortness of breath, heart palpitations, and falls. She has occasional headaches and dizziness with position changes. Denies severe headaches, confusion, seizures, double vision, and blurred vision, nausea and vomiting. She denies fevers, chills, fatigue, recent infections, weight loss, and night sweats. No reports of GI problems such as diarrhea, and constipation. She has no reports of blood in stools, dysuria and hematuria. She is to begin Therapy sessions next month. Her anxiety is moderate today, r/t increased weight gain. She denies suicidal ideations, homicidal ideations, or auditory hallucinations. She denies pain today.   Past Surgical History:  Procedure Laterality Date  . BILATERAL SALPINGECTOMY Bilateral 11/28/2013   Procedure: BILATERAL SALPINGECTOMY;  Surgeon: Betsy Coder, MD;  Location: [facility];  Service: Obstetrics;  Laterality: Bilateral;  Partial on Left  . CESAREAN SECTION  05/14/2012   Procedure: CESAREAN SECTION;  Surgeon: Eldred Manges, MD;  Location: [facility];  Service:  Gynecology;  Laterality: N/A;  Primary cesarean section of baby girl    at [place] 8/9  . CESAREAN SECTION N/A 11/28/2013   Procedure: CESAREAN SECTION;  Surgeon: Betsy Coder, MD;  Location: [facility];  Service: Obstetrics;  Laterality: N/A;  . CESAREAN SECTION      Family History  Problem Relation Age of Onset  . Hypertension Mother   . Diabetes Mellitus II Mother   . CAD Father   . Hyperlipidemia Father   . Hypertension Sister   . Diabetes Mellitus II Sister   . Breast cancer Mother 77  . Heart failure Mother   . Diabetes Sister   . Hypertension Sister   . Thyroid disease Sister   . Anemia Sister   . Anesthesia problems Neg Hx   . Heart disease Father   . Heart attack Father   . Heart murmur Sister   . Heart murmur Daughter     Social History   Socioeconomic History  . Marital status: Married    Spouse name: Not on file  . Number of children: Not on file  . Years of education: Not on file  . Highest education level: Not on file  Occupational History  . Not on file  Social Needs  . Financial resource strain: Not on file  . Food insecurity    Worry: Not on file    Inability: Not on file  . Transportation needs  Medical: Not on file    Non-medical: Not on file  Tobacco Use  . Smoking status: Never Smoker  . Smokeless tobacco: Never Used  Substance and Sexual Activity  . Alcohol use: No  . Drug use: No  . Sexual activity: Yes    Birth control/protection: None  Lifestyle  . Physical activity    Days per week: Not on file    Minutes per session: Not on file  . Stress: Not on file  Relationships  . Social Herbalist on phone: Not on file    Gets together: Not on file    Attends religious service: Not on file    Active member of club or organization: Not on file    Attends meetings of clubs or organizations: Not on file    Relationship status: Not on file  . Intimate partner violence    Fear of current or ex partner: Not on file     Emotionally abused: Not on file    Physically abused: Not on file    Forced sexual activity: Not on file  Other Topics Concern  . Not on file  Social History Narrative   ** Merged History Encounter **       Lives with husband and 2 children in a one story home.   Works as an Building control surveyor.   Education: college degree.    Outpatient Medications Prior to Visit  Medication Sig Dispense Refill  . acetaminophen (TYLENOL) 500 MG tablet Take 500 mg by mouth every 6 (six) hours as needed for mild pain or headache.    Marland Kitchen amLODipine (NORVASC) 10 MG tablet Take 1 tablet (10 mg total) by mouth daily. 30 tablet 2  . aspirin EC 325 MG tablet Take 325 mg by mouth daily.    Marland Kitchen atorvastatin (LIPITOR) 10 MG tablet Take 10 mg by mouth daily.  1  . bisoprolol (ZEBETA) 5 MG tablet Take 1 tablet (5 mg total) by mouth daily. 30 tablet 2  . blood glucose meter kit and supplies Dispense based on patient and insurance preference. Use up to four times daily as directed. (FOR ICD-9 250.00, 250.01). 1 each 0  . ferrous sulfate (FERROUSUL) 325 (65 FE) MG tablet Take 1 tablet (325 mg total) by mouth daily with breakfast. 30 tablet 0  . furosemide (LASIX) 20 MG tablet Take 1 tablet (20 mg total) by mouth daily. 30 tablet 2  . gabapentin (NEURONTIN) 100 MG capsule Take 2 capsule (200 mg total) twice daily. 120 capsule 2  . hydrochlorothiazide (HYDRODIURIL) 25 MG tablet Take 1 tablet (25 mg total) by mouth daily. 30 tablet 2  . ibuprofen (ADVIL,MOTRIN) 800 MG tablet Take 1 tablet (800 mg total) by mouth every 8 (eight) hours as needed for moderate pain. 21 tablet 0  . losartan (COZAAR) 25 MG tablet Take 1 tablet (25 mg total) by mouth daily. 30 tablet 2  . busPIRone (BUSPAR) 10 MG tablet Take 1 tablet (10 mg total) by mouth 2 (two) times daily. (Patient not taking: Reported on 08/12/2019) 60 tablet 3  . cyclobenzaprine (FLEXERIL) 10 MG tablet Take 1 tablet (10 mg total) by mouth 3 (three) times daily as needed for  muscle spasms. (Patient not taking: Reported on 08/12/2019) 30 tablet 1  . metFORMIN (GLUCOPHAGE-XR) 500 MG 24 hr tablet Take 1 tablet (500 mg total) by mouth daily with breakfast. 90 tablet 2  . topiramate (TOPAMAX) 25 MG tablet Take 1 tablet (25 mg total) by mouth  2 (two) times daily. (Patient not taking: Reported on 08/12/2019) 60 tablet 2   No facility-administered medications prior to visit.     No Known Allergies  ROS Review of Systems  Constitutional: Negative.   HENT: Negative.   Eyes: Negative.   Respiratory: Negative.   Cardiovascular: Negative.   Gastrointestinal: Positive for abdominal distention.  Endocrine: Negative.   Genitourinary: Negative.   Musculoskeletal: Negative.   Skin: Negative.   Allergic/Immunologic: Negative.   Neurological: Positive for dizziness (occasional ) and headaches (occasional ).  Hematological: Negative.   Psychiatric/Behavioral: The patient is nervous/anxious.    Objective:    Physical Exam  Constitutional: She is oriented to person, place, and time. She appears well-developed and well-nourished.  HENT:  Head: Normocephalic and atraumatic.  Eyes: Conjunctivae are normal.  Neck: Normal range of motion. Neck supple.  Cardiovascular: Normal rate, regular rhythm, normal heart sounds and intact distal pulses.  Pulmonary/Chest: Effort normal and breath sounds normal.  Abdominal: Soft. Bowel sounds are normal. She exhibits distension (obese).  Neurological: She is alert and oriented to person, place, and time.  Skin: Skin is dry.  Nursing note and vitals reviewed.   BP (!) 159/89   Pulse 61   Temp 98.3 F (36.8 C) (Oral)   Ht 5' 6"  (1.676 m)   Wt (!) 343 lb 9.6 oz (155.9 kg)   LMP 07/10/2019   SpO2 98%   BMI 55.46 kg/m  Wt Readings from Last 3 Encounters:  08/12/19 (!) 343 lb 9.6 oz (155.9 kg)  12/11/18 (!) 332 lb 3.2 oz (150.7 kg)  11/13/18 (!) 324 lb (147 kg)     Health Maintenance Due  Topic Date Due  . PNEUMOCOCCAL  POLYSACCHARIDE VACCINE AGE 17-64 HIGH RISK  01/23/1986  . FOOT EXAM  01/23/1994  . OPHTHALMOLOGY EXAM  01/23/1994  . PAP SMEAR-Modifier  06/04/2008  . HEMOGLOBIN A1C  01/16/2019  . INFLUENZA VACCINE  04/26/2019    There are no preventive care reminders to display for this patient.  Lab Results  Component Value Date   TSH 2.850 11/13/2018   Lab Results  Component Value Date   WBC 6.0 11/13/2018   HGB 11.5 11/13/2018   HCT 35.4 11/13/2018   MCV 74 (L) 11/13/2018   PLT 341 11/13/2018   Lab Results  Component Value Date   NA 141 08/28/2017   K 4.5 08/28/2017   CO2 22 08/28/2017   GLUCOSE 99 08/28/2017   BUN 8 08/28/2017   CREATININE 0.67 08/28/2017   BILITOT 0.4 05/01/2017   ALKPHOS 91 05/01/2017   AST 22 05/01/2017   ALT 20 05/01/2017   PROT 8.1 05/01/2017   ALBUMIN 3.2 (L) 05/01/2017   CALCIUM 9.2 08/28/2017   ANIONGAP 7 05/03/2017   Lab Results  Component Value Date   CHOL 186 11/13/2018   Lab Results  Component Value Date   HDL 49 11/13/2018   Lab Results  Component Value Date   LDLCALC 114 (H) 11/13/2018   Lab Results  Component Value Date   TRIG 115 11/13/2018   Lab Results  Component Value Date   CHOLHDL 3.8 11/13/2018   Lab Results  Component Value Date   HGBA1C 6.5 (A) 08/12/2019      Assessment & Plan:   1. Essential hypertension Blood pressures are elevate, but stable today. She will return to office within one week for BP recheck. She will continue to take medications as prescribed, to decrease high sodium intake, excessive alcohol intake, increase potassium intake,  smoking cessation, and increase physical activity of at least 30 minutes of cardio activity daily. She will continue to follow Heart Healthy or DASH diet. - Urinalysis Dipstick  2. Type 2 diabetes mellitus without complication, without long-term current use of insulin (HCC) Stable. Blood glucose within normal range today. She will continue medication as prescribed, to decrease  foods/beverages high in sugars and carbs and follow Heart Healthy or DASH diet. Increase physical activity to at least 30 minutes cardio exercise daily.  - Urinalysis Dipstick - Glucose (CBG) - POCT HgB A1C - metFORMIN (GLUCOPHAGE) 500 MG tablet; Take 1 tablet (500 mg total) by mouth daily.  Dispense: 30 tablet; Refill: 3 - Dulaglutide (TRULICITY) 2.40 XB/3.5HG SOPN; Inject 0.75 mg into the skin once a week.  Dispense: 0.75 mL; Refill: 3  3. Hemoglobin A1c less than 7.0% Hgb A1c stable at 6.5 today.   4. Class 3 severe obesity due to excess calories with serious comorbidity and body mass index (BMI) of 50.0 to 59.9 in adult [facility] Body mass index is 55.46 kg/m. Goal BMI  is <30. Encouraged efforts to reduce weight include engaging in physical activity as tolerated with goal of 150 minutes per week. Improve dietary choices and eat a meal regimen consistent with a Mediterranean or DASH diet. Reduce simple carbohydrates. Do not skip meals and eat healthy snacks throughout the day to avoid over-eating at dinner. Set a goal weight loss that is achievable for you. We will initiate Trulicity today.  - Dulaglutide (TRULICITY) 9.92 EQ/6.8TM SOPN; Inject 0.75 mg into the skin once a week.  Dispense: 0.75 mL; Refill: 3  5. Anxiety Moderate today.   6. Peripheral edema  7. Possible exposure to STD - HepB+HepC+HIV Panel - RPR - HSV(herpes smplx)abs-1+2(IgG+IgM)-bld - NuSwab Vaginitis Plus (VG+)  8. Follow up She will follow up in 3 months.   Meds ordered this encounter  Medications  . DISCONTD: metFORMIN (GLUCOPHAGE) 500 MG tablet    Sig: Take 1 tablet (500 mg total) by mouth daily.    Dispense:  30 tablet    Refill:  3  . metFORMIN (GLUCOPHAGE) 500 MG tablet    Sig: Take 1 tablet (500 mg total) by mouth daily.    Dispense:  30 tablet    Refill:  3  . Dulaglutide (TRULICITY) 1.96 QI/2.9NL SOPN    Sig: Inject 0.75 mg into the skin once a week.    Dispense:  0.75 mL    Refill:  3    Orders  Placed This Encounter  Procedures  . HepB+HepC+HIV Panel  . RPR  . HSV(herpes smplx)abs-1+2(IgG+IgM)-bld  . NuSwab Vaginitis Plus (VG+)  . Urinalysis Dipstick  . Glucose (CBG)  . POCT HgB A1C    Referral Orders  No referral(s) requested today    Kathe Becton,  MSN, FNP-BC [address] [place], [place] [address] [phone] fax  Problem List Items Addressed This Visit      Cardiovascular and Mediastinum   Essential hypertension - Primary   Relevant Orders   Urinalysis Dipstick (Completed)     Endocrine   Type 2 diabetes mellitus without complication (HCC)   Relevant Medications   metFORMIN (GLUCOPHAGE) 500 MG tablet   Dulaglutide (TRULICITY) 8.18 HU/3.1SH SOPN   Other Relevant Orders   Urinalysis Dipstick (Completed)   Glucose (CBG) (Completed)   POCT HgB A1C (Completed)     Other   Anxiety   Class 3 severe  obesity due to excess calories with serious comorbidity and body mass index (BMI) of 50.0 to 59.9 in adult [facility])   Relevant Medications   metFORMIN (GLUCOPHAGE) 500 MG tablet   Dulaglutide (TRULICITY) 5.03 UU/8.2CM SOPN   Morbid obesity (HCC)   Relevant Medications   metFORMIN (GLUCOPHAGE) 500 MG tablet   Dulaglutide (TRULICITY) 0.34 JZ/7.9XT SOPN   Peripheral edema    Other Visit Diagnoses    Hemoglobin A1c less than 7.0%       Possible exposure to STD       Relevant Orders   HepB+HepC+HIV Panel   RPR   HSV(herpes smplx)abs-1+2(IgG+IgM)-bld   NuSwab Vaginitis Plus (VG+)   Follow up          Meds ordered this encounter  Medications  . DISCONTD: metFORMIN (GLUCOPHAGE) 500 MG tablet    Sig: Take 1 tablet (500 mg total) by mouth daily.    Dispense:  30 tablet    Refill:  3  . metFORMIN (GLUCOPHAGE) 500 MG tablet    Sig: Take 1 tablet (500 mg total) by mouth daily.    Dispense:  30 tablet    Refill:  3  . Dulaglutide (TRULICITY) 0.56 PV/9.4IA SOPN     Sig: Inject 0.75 mg into the skin once a week.    Dispense:  0.75 mL    Refill:  3    Follow-up: Return in about 3 months (around 11/12/2019).    Azzie Glatter, FNP

## 2019-08-12 NOTE — Patient Instructions (Signed)
Exercising to Lose Weight Exercise is structured, repetitive physical activity to improve fitness and health. Getting regular exercise is important for everyone. It is especially important if you are overweight. Being overweight increases your risk of heart disease, stroke, diabetes, high blood pressure, and several types of cancer. Reducing your calorie intake and exercising can help you lose weight. Exercise is usually categorized as moderate or vigorous intensity. To lose weight, most people need to do a certain amount of moderate-intensity or vigorous-intensity exercise each week. Moderate-intensity exercise  Moderate-intensity exercise is any activity that gets you moving enough to burn at least three times more energy (calories) than if you were sitting. Examples of moderate exercise include:  Walking a mile in 15 minutes.  Doing light yard work.  Biking at an easy pace. Most people should get at least 150 minutes (2 hours and 30 minutes) a week of moderate-intensity exercise to maintain their body weight. Vigorous-intensity exercise Vigorous-intensity exercise is any activity that gets you moving enough to burn at least six times more calories than if you were sitting. When you exercise at this intensity, you should be working hard enough that you are not able to carry on a conversation. Examples of vigorous exercise include:  Running.  Playing a team sport, such as football, basketball, and soccer.  Jumping rope. Most people should get at least 75 minutes (1 hour and 15 minutes) a week of vigorous-intensity exercise to maintain their body weight. How can exercise affect me? When you exercise enough to burn more calories than you eat, you lose weight. Exercise also reduces body fat and builds muscle. The more muscle you have, the more calories you burn. Exercise also:  Improves mood.  Reduces stress and tension.  Improves your overall fitness, flexibility, and endurance.   Increases bone strength. The amount of exercise you need to lose weight depends on:  Your age.  The type of exercise.  Any health conditions you have.  Your overall physical ability. Talk to your health care provider about how much exercise you need and what types of activities are safe for you. What actions can I take to lose weight? Nutrition   Make changes to your diet as told by your health care provider or diet and nutrition specialist (dietitian). This may include: ? Eating fewer calories. ? Eating more protein. ? Eating less unhealthy fats. ? Eating a diet that includes fresh fruits and vegetables, whole grains, low-fat dairy products, and lean protein. ? Avoiding foods with added fat, salt, and sugar.  Drink plenty of water while you exercise to prevent dehydration or heat stroke. Activity  Choose an activity that you enjoy and set realistic goals. Your health care provider can help you make an exercise plan that works for you.  Exercise at a moderate or vigorous intensity most days of the week. ? The intensity of exercise may vary from person to person. You can tell how intense a workout is for you by paying attention to your breathing and heartbeat. Most people will notice their breathing and heartbeat get faster with more intense exercise.  Do resistance training twice each week, such as: ? Push-ups. ? Sit-ups. ? Lifting weights. ? Using resistance bands.  Getting short amounts of exercise can be just as helpful as long structured periods of exercise. If you have trouble finding time to exercise, try to include exercise in your daily routine. ? Get up, stretch, and walk around every 30 minutes throughout the day. ? Go for a   walk during your lunch break. ? Park your car farther away from your destination. ? If you take public transportation, get off one stop early and walk the rest of the way. ? Make phone calls while standing up and walking around. ? Take the  stairs instead of elevators or escalators.  Wear comfortable clothes and shoes with good support.  Do not exercise so much that you hurt yourself, feel dizzy, or get very short of breath. Where to find more information  U.S. Department of Health and Human Services: BondedCompany.at  Centers for Disease Control and Prevention (CDC): http://www.wolf.info/ Contact a health care provider:  Before starting a new exercise program.  If you have questions or concerns about your weight.  If you have a medical problem that keeps you from exercising. Get help right away if you have any of the following while exercising:  Injury.  Dizziness.  Difficulty breathing or shortness of breath that does not go away when you stop exercising.  Chest pain.  Rapid heartbeat. Summary  Being overweight increases your risk of heart disease, stroke, diabetes, high blood pressure, and several types of cancer.  Losing weight happens when you burn more calories than you eat.  Reducing the amount of calories you eat in addition to getting regular moderate or vigorous exercise each week helps you lose weight. This information is not intended to replace advice given to you by your health care provider. Make sure you discuss any questions you have with your health care provider. Document Released: 10/14/2010 Document Revised: 09/24/2017 Document Reviewed: 09/24/2017 Elsevier Patient Education  2020 [place] DASH stands for "Dietary Approaches to Stop Hypertension." The DASH eating plan is a healthy eating plan that has been shown to reduce high blood pressure (hypertension). It may also reduce your risk for type 2 diabetes, heart disease, and stroke. The DASH eating plan may also help with weight loss. What are tips for following this plan?  General guidelines  Avoid eating more than 2,300 mg (milligrams) of salt (sodium) a day. If you have hypertension, you may need to reduce your sodium intake to  1,500 mg a day.  Limit alcohol intake to no more than 1 drink a day for nonpregnant women and 2 drinks a day for men. One drink equals 12 oz of beer, 5 oz of wine, or 1 oz of hard liquor.  Work with your health care provider to maintain a healthy body weight or to lose weight. Ask what an ideal weight is for you.  Get at least 30 minutes of exercise that causes your heart to beat faster (aerobic exercise) most days of the week. Activities may include walking, swimming, or biking.  Work with your health care provider or diet and nutrition specialist (dietitian) to adjust your eating plan to your individual calorie needs. Reading food labels   Check food labels for the amount of sodium per serving. Choose foods with less than 5 percent of the Daily Value of sodium. Generally, foods with less than 300 mg of sodium per serving fit into this eating plan.  To find whole grains, look for the word "whole" as the first word in the ingredient list. Shopping  Buy products labeled as "low-sodium" or "no salt added."  Buy fresh foods. Avoid canned foods and premade or frozen meals. Cooking  Avoid adding salt when cooking. Use salt-free seasonings or herbs instead of table salt or sea salt. Check with your health care provider or pharmacist before using  salt substitutes.  Do not fry foods. Cook foods using healthy methods such as baking, boiling, grilling, and broiling instead.  Cook with heart-healthy oils, such as olive, canola, soybean, or sunflower oil. Meal planning  Eat a balanced diet that includes: ? 5 or more servings of fruits and vegetables each day. At each meal, try to fill half of your plate with fruits and vegetables. ? Up to 6-8 servings of whole grains each day. ? Less than 6 oz of lean meat, poultry, or fish each day. A 3-oz serving of meat is about the same size as a deck of cards. One egg equals 1 oz. ? 2 servings of low-fat dairy each day. ? A serving of nuts, seeds, or  beans 5 times each week. ? Heart-healthy fats. Healthy fats called Omega-3 fatty acids are found in foods such as flaxseeds and coldwater fish, like sardines, salmon, and mackerel.  Limit how much you eat of the following: ? Canned or prepackaged foods. ? Food that is high in trans fat, such as fried foods. ? Food that is high in saturated fat, such as fatty meat. ? Sweets, desserts, sugary drinks, and other foods with added sugar. ? Full-fat dairy products.  Do not salt foods before eating.  Try to eat at least 2 vegetarian meals each week.  Eat more home-cooked food and less restaurant, buffet, and fast food.  When eating at a restaurant, ask that your food be prepared with less salt or no salt, if possible. What foods are recommended? The items listed may not be a complete list. Talk with your dietitian about what dietary choices are best for you. Grains Whole-grain or whole-wheat bread. Whole-grain or whole-wheat pasta. Brown rice. Orpah Cobb. Bulgur. Whole-grain and low-sodium cereals. Pita bread. Low-fat, low-sodium crackers. Whole-wheat flour tortillas. Vegetables Fresh or frozen vegetables (raw, steamed, roasted, or grilled). Low-sodium or reduced-sodium tomato and vegetable juice. Low-sodium or reduced-sodium tomato sauce and tomato paste. Low-sodium or reduced-sodium canned vegetables. Fruits All fresh, dried, or frozen fruit. Canned fruit in natural juice (without added sugar). Meat and other protein foods Skinless chicken or [place]. Ground chicken or [place]. Pork with fat trimmed off. Fish and seafood. Egg whites. Dried beans, peas, or lentils. Unsalted nuts, nut butters, and seeds. Unsalted canned beans. Lean cuts of beef with fat trimmed off. Low-sodium, lean deli meat. Dairy Low-fat (1%) or fat-free (skim) milk. Fat-free, low-fat, or reduced-fat cheeses. Nonfat, low-sodium ricotta or cottage cheese. Low-fat or nonfat yogurt. Low-fat, low-sodium cheese. Fats and oils  Soft margarine without trans fats. Vegetable oil. Low-fat, reduced-fat, or light mayonnaise and salad dressings (reduced-sodium). Canola, safflower, olive, soybean, and sunflower oils. Avocado. Seasoning and other foods Herbs. Spices. Seasoning mixes without salt. Unsalted popcorn and pretzels. Fat-free sweets. What foods are not recommended? The items listed may not be a complete list. Talk with your dietitian about what dietary choices are best for you. Grains Baked goods made with fat, such as croissants, muffins, or some breads. Dry pasta or rice meal packs. Vegetables Creamed or fried vegetables. Vegetables in a cheese sauce. Regular canned vegetables (not low-sodium or reduced-sodium). Regular canned tomato sauce and paste (not low-sodium or reduced-sodium). Regular tomato and vegetable juice (not low-sodium or reduced-sodium). Rosita Fire. Olives. Fruits Canned fruit in a light or heavy syrup. Fried fruit. Fruit in cream or butter sauce. Meat and other protein foods Fatty cuts of meat. Ribs. Fried meat. Tomasa Blase. Sausage. Bologna and other processed lunch meats. Salami. Fatback. Hotdogs. Bratwurst. Salted nuts and seeds.  Canned beans with added salt. Canned or smoked fish. Whole eggs or egg yolks. Chicken or [place] with skin. Dairy Whole or 2% milk, cream, and half-and-half. Whole or full-fat cream cheese. Whole-fat or sweetened yogurt. Full-fat cheese. Nondairy creamers. Whipped toppings. Processed cheese and cheese spreads. Fats and oils Butter. Stick margarine. Lard. Shortening. Ghee. Bacon fat. Tropical oils, such as coconut, palm kernel, or palm oil. Seasoning and other foods Salted popcorn and pretzels. Onion salt, garlic salt, seasoned salt, table salt, and sea salt. Worcestershire sauce. Tartar sauce. Barbecue sauce. Teriyaki sauce. Soy sauce, including reduced-sodium. Steak sauce. Canned and packaged gravies. Fish sauce. Oyster sauce. Cocktail sauce. Horseradish that you find on the  shelf. Ketchup. Mustard. Meat flavorings and tenderizers. Bouillon cubes. Hot sauce and Tabasco sauce. Premade or packaged marinades. Premade or packaged taco seasonings. Relishes. Regular salad dressings. Where to find more information:  National Heart, Lung, and [place]: https://wilson-eaton.com/  American Heart Association: www.heart.org Summary  The DASH eating plan is a healthy eating plan that has been shown to reduce high blood pressure (hypertension). It may also reduce your risk for type 2 diabetes, heart disease, and stroke.  With the DASH eating plan, you should limit salt (sodium) intake to 2,300 mg a day. If you have hypertension, you may need to reduce your sodium intake to 1,500 mg a day.  When on the DASH eating plan, aim to eat more fresh fruits and vegetables, whole grains, lean proteins, low-fat dairy, and heart-healthy fats.  Work with your health care provider or diet and nutrition specialist (dietitian) to adjust your eating plan to your individual calorie needs. This information is not intended to replace advice given to you by your health care provider. Make sure you discuss any questions you have with your health care provider. Document Released: 08/31/2011 Document Revised: 08/24/2017 Document Reviewed: 09/04/2016 Elsevier Patient Education  2020 [employer].

## 2019-08-13 DIAGNOSIS — R7309 Other abnormal glucose: Secondary | ICD-10-CM | POA: Insufficient documentation

## 2019-08-13 DIAGNOSIS — E1165 Type 2 diabetes mellitus with hyperglycemia: Secondary | ICD-10-CM | POA: Insufficient documentation

## 2019-08-14 ENCOUNTER — Encounter: Payer: Self-pay | Admitting: Family Medicine

## 2019-08-14 LAB — HSV(HERPES SMPLX)ABS-I+II(IGG+IGM)-BLD
HSV 1 Glycoprotein G Ab, IgG: <0.91 index (ref 0.00–0.90)
HSV 2 IgG, Type Spec: <0.91 index (ref 0.00–0.90)
HSVI/II Comb IgM: <0.91 Ratio (ref 0.00–0.90)

## 2019-08-14 LAB — HEPB+HEPC+HIV PANEL
HIV Screen 4th Generation wRfx: NONREACTIVE
Hep B C IgM: NEGATIVE
Hep B Core Total Ab: NEGATIVE
Hep B E Ab: NEGATIVE
Hep B E Ag: NEGATIVE
Hep B Surface Ab, Qual: NONREACTIVE
Hep C Virus Ab: <0.1 s/co ratio (ref 0.0–0.9)
Hepatitis B Surface Ag: NEGATIVE

## 2019-08-14 LAB — RPR: RPR Ser Ql: NONREACTIVE

## 2019-08-16 LAB — NUSWAB VAGINITIS PLUS (VG+)
Candida albicans, NAA: NEGATIVE
Candida glabrata, NAA: NEGATIVE
Chlamydia trachomatis, NAA: NEGATIVE
Neisseria gonorrhoeae, NAA: NEGATIVE
Trich vag by NAA: NEGATIVE

## 2019-08-18 ENCOUNTER — Telehealth: Payer: Self-pay | Admitting: Family Medicine

## 2019-08-18 ENCOUNTER — Other Ambulatory Visit: Payer: Self-pay | Admitting: Family Medicine

## 2019-08-18 DIAGNOSIS — E66813 Obesity, class 3: Secondary | ICD-10-CM

## 2019-08-18 DIAGNOSIS — Z6841 Body Mass Index (BMI) 40.0 and over, adult: Secondary | ICD-10-CM

## 2019-08-18 NOTE — Telephone Encounter (Signed)
Multiple attempts to contact patient concerning weight loss issues and other options. Left message on patient's voicemail today to contact office as soon as she can.

## 2019-09-15 ENCOUNTER — Ambulatory Visit (INDEPENDENT_AMBULATORY_CARE_PROVIDER_SITE_OTHER): Payer: Medicaid Other | Admitting: Psychology

## 2019-09-15 ENCOUNTER — Other Ambulatory Visit: Payer: Self-pay

## 2019-09-15 ENCOUNTER — Encounter (HOSPITAL_COMMUNITY): Payer: Self-pay | Admitting: Psychology

## 2019-09-15 DIAGNOSIS — F321 Major depressive disorder, single episode, moderate: Secondary | ICD-10-CM | POA: Diagnosis not present

## 2019-09-15 NOTE — Progress Notes (Signed)
Virtual Visit via Video Note  I connected with Autumn [name] on 09/15/19 at 11:05 AM EST by a video enabled telemedicine application and verified that I am speaking with the correct person using two identifiers.   I discussed the limitations of evaluation and management by telemedicine and the availability of in person appointments. The patient expressed understanding and agreed to proceed.      I discussed the assessment and treatment plan with the patient. The patient was provided an opportunity to ask questions and all were answered. The patient agreed with the plan and demonstrated an understanding of the instructions.   The patient was advised to call back or seek an in-person evaluation if the symptoms worsen or if the condition fails to improve as anticipated.  I provided 60 minutes of non-face-to-face time during this encounter.   Autumn [name] Autumn [name]  Comprehensive Clinical Assessment (CCA) Note  09/15/2019 Autumn [name] [id number]  Visit Diagnosis:      ICD-10-CM   1. Current moderate episode of major depressive disorder without prior episode (HCC)  F32.1       CCA Part One  Part One has been completed on paper by the patient.  (See scanned document in Chart Review)  CCA Part Two A  Intake/Chief Complaint:  CCA Intake With Chief Complaint CCA Part Two Date: 09/15/19 CCA Part Two Time: 1105 Chief Complaint/Presenting Problem: Pt presents as referred by her PCP for depressive symptoms.  Pt reports that over the past 2 years she has been feeling increased sadness, increased crying and become depressed.  Pt reports that she is aware that this is related to struggles in her marriage and husband's years of infidelity that came to light when they had marriage counseling beginning 2 years ago.  pt reported that she also has struggled w/ medical issues- leaky heart valve, mini stroke 2 years ago, diabetes, high blood pressure, pain in her legs daily.  Pt  recognizes that for years she has been pushing through to be parent, wife and work full time in the past and has worn down.  pt reported she also has difficulty saying no to others and speaking up for how she feels.  Pt reports in marriage although they are in better place still stress of husband mentality that she takes care of household fully and doesn't see him helping much in household or w/ girls. Patients Currently Reported Symptoms/Problems: increasing sadness, increased periods of crying, withdrawing, depressed moods, days of not feeling like getting up out of bed or being bothered by her girls, constant fatigue, difficulty sleeping some nights and feeling lonely.  pt denies in SI, no self harm hx, no hallucinations.  pt denies any SA hx. 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 Individual's Preferences: pt " being able to learn tools to express myself. someone to talk to and decompress.  what to do when feel sadness/anxiety - how to turn around". Type of Services Patient Feels Are Needed: counseling  Mental Health Symptoms Depression:  Depression: Change in energy/activity, Difficulty Concentrating, Fatigue, Irritability, Sleep (too much or little), Tearfulness  Mania:  Mania: N/A  Anxiety:   Anxiety: Worrying, Sleep, Fatigue, Difficulty concentrating  Psychosis:  Psychosis: N/A  Trauma:  Trauma: N/A  Obsessions:  Obsessions: N/A  Compulsions:  Compulsions: N/A  Inattention:  Inattention: N/A  Hyperactivity/Impulsivity:  Hyperactivity/Impulsivity: N/A  Oppositional/Defiant Behaviors:  Oppositional/Defiant  Behaviors: N/A  Borderline Personality:  Emotional Irregularity: N/A  Other Mood/Personality Symptoms:      Mental Status Exam Appearance and self-care  Stature:  Stature: Average  Weight:  Weight: Overweight  Clothing:  Clothing:  Neat/clean  Grooming:  Grooming: Normal  Cosmetic use:  Cosmetic Use: Age appropriate  Posture/gait:  Posture/Gait: Normal  Motor activity:  Motor Activity: Not Remarkable  Sensorium  Attention:  Attention: Normal  Concentration:  Concentration: Normal  Orientation:  Orientation: X5  Recall/memory:  Recall/Memory: Normal  Affect and Mood  Affect:  Affect: Appropriate  Mood:  Mood: Depressed  Relating  Eye contact:  Eye Contact: Normal  Facial expression:  Facial Expression: Responsive  Attitude toward examiner:  Attitude Toward Examiner: Cooperative  Thought and Language  Speech flow: Speech Flow: Normal  Thought content:  Thought Content: Appropriate to mood and circumstances  Preoccupation:     Hallucinations:     Organization:     Transport planner of Knowledge:  Fund of Knowledge: Average  Intelligence:  Intelligence: Average  Abstraction:  Abstraction: Normal  Judgement:  Judgement: Normal  Reality Testing:  Reality Testing: Adequate  Insight:  Insight: Good  Decision Making:  Decision Making: Normal  Social Functioning  Social Maturity:  Social Maturity: Responsible  Social Judgement:  Social Judgement: Normal  Stress  Stressors:  Stressors: (marriage)  Coping Ability:  Coping Ability: English as a second language teacher Deficits:     Supports:      Family and Psychosocial History: Family history Marital status: Married Number of Years Married: 7(together 2 years prior to marriage) What types of issues is patient dealing with in the relationship?: husband hx of infidelity.  1st discovered when oldest daughter was a baby- stayed w/ parents for a while.  pt learned in marriage counseing that first 5 years multiple infidelities and present at time of marriage counseling. Are you sexually active?: Yes Does patient have children?: Yes How many children?: 2 How is patient's relationship with their children?: daughters 7y/o and 5y/o.  positive relationship w/. both are currently  in remote learning.  Childhood History:  Childhood History By whom was/is the patient raised?: Both parents Description of patient's relationship with caregiver when they were a child: positive relationship. Patient's description of current relationship with people who raised him/her: positive relationship w/ current.  they do depend on her at times to help w/ her oldest sister w/ special needs. Does patient have siblings?: Yes Number of Siblings: 5 Description of patient's current relationship with siblings: pt has 57 sisters and she is the youngest.  pt reported positive relationship w/ all her sisters- very close to 2. 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 Did You Receive Any Psychiatric Treatment/Services While in the [place]?: No Are There Guns or Other Weapons in [place]?: No  Education: Education Did Teacher, adult education From [employer]?: Yes Did Physicist, medical?: Yes What Type of College Degree Do you Have?:  associates in [employer]. Did You Have An Individualized Education Program (IIEP): No Did You Have Any Difficulty At School?: No  Religion: Religion/Spirituality Are You A Religious Person?: Yes What is Your Religious Affiliation?: Christian How Might This Affect Treatment?: her faith is a support to Web designerehr  Leisure/Recreation: Leisure / Recreation Leisure  and Hobbies: baking, involved w/ church  Exercise/Diet: Exercise/Diet Do You Exercise?: No Have You Gained or Lost A Significant Amount of Weight in the Past Six Months?: No Do You Follow a Special Diet?: Yes Type of Diet: diabetic diet Do You Have Any Trouble Sleeping?: Yes Explanation of Sleeping Difficulties: some nights difificulty falling asleep, other nights difficulty staying asleep.  CCA Part Two C  Alcohol/Drug Use: Alcohol / Drug Use History of alcohol / drug use?: No history of alcohol / drug abuse                      CCA Part Three  ASAM's:  Six Dimensions of Multidimensional Assessment  Dimension 1:  Acute Intoxication and/or Withdrawal Potential:     Dimension 2:  Biomedical Conditions and Complications:     Dimension 3:  Emotional, Behavioral, or Cognitive Conditions and Complications:     Dimension 4:  Readiness to Change:     Dimension 5:  Relapse, Continued use, or Continued Problem Potential:     Dimension 6:  Recovery/Living Environment:      Substance use Disorder (SUD)    Social Function:  Social Functioning Social Maturity: Responsible Social Judgement: Normal  Stress:  Stress Stressors: (marriage) Coping Ability: Overwhelmed Patient Takes Medications The Way The Doctor Instructed?: Yes Priority Risk: Low Acuity  Risk Assessment- Self-Harm Potential: Risk Assessment For Self-Harm Potential Thoughts of Self-Harm: No current thoughts Method: No plan  Risk Assessment -Dangerous to Others Potential: Risk Assessment For Dangerous to Others Potential Method: No Plan  DSM5 Diagnoses: Patient Active Problem List   Diagnosis Date Noted  . Hemoglobin A1c less than 7.0% 08/13/2019  . Class 3 severe obesity due to excess calories with serious comorbidity and body mass index (BMI) of 50.0 to 59.9 in adult (HCC) 03/07/2019  . Anxiety 12/11/2018  . Peripheral edema 12/11/2018  . Type 2 diabetes mellitus without complication (HCC) 05/03/2017   . Complicated migraine 05/02/2017  . Hypertensive urgency 05/02/2017  . Normocytic anemia 05/02/2017  . Left-sided weakness 05/02/2017  . Essential hypertension   . Myofascial pain 01/13/2016  . Weakness 01/13/2016  . Pain in limb 01/13/2016  . Chest pain at rest 12/14/2015  . Preeclampsia complicating hypertension 11/29/2013  . Chronic hypertension with superimposed preeclampsia 11/28/2013  . Positive GBS test 11/28/2013  . Chronic headaches 11/28/2013  . Leaky heart valve 05/12/2012  . Morbid obesity (HCC) 01/22/2012    Patient Centered Plan: Patient is on the following Treatment Plan(s):  Depression  Recommendations for Services/Supports/Treatments: Recommendations for Services/Supports/Treatments Recommendations For Services/Supports/Treatments: Individual Therapy  Treatment Plan Summary: OP Treatment Plan Summary: Pt to attend biweekly counseling to assist coping w/ stressors and reducing depression.  Pt to f/u in 2 weeks w/ counseling via webex.  Pt to f/u w/ her PCP as needed.   Autumn RadonYATES,

## 2019-10-06 ENCOUNTER — Ambulatory Visit (INDEPENDENT_AMBULATORY_CARE_PROVIDER_SITE_OTHER): Payer: Medicaid Other | Admitting: Psychology

## 2019-10-06 ENCOUNTER — Other Ambulatory Visit: Payer: Self-pay

## 2019-10-06 DIAGNOSIS — F321 Major depressive disorder, single episode, moderate: Secondary | ICD-10-CM

## 2019-10-06 NOTE — Progress Notes (Signed)
Virtual Visit via Video Note  I connected with Autumn [name] on 10/06/19 at 10:00 AM EST by a video enabled telemedicine application and verified that I am speaking with the correct person using two identifiers.   I discussed the limitations of evaluation and management by telemedicine and the availability of in person appointments. The patient expressed understanding and agreed to proceed.    I discussed the assessment and treatment plan with the patient. The patient was provided an opportunity to ask questions and all were answered. The patient agreed with the plan and demonstrated an understanding of the instructions.   The patient was advised to call back or seek an in-person evaluation if the symptoms worsen or if the condition fails to improve as anticipated.  I provided 50 minutes of non-face-to-face time during this encounter.   Forde Radon [facility]    THERAPIST PROGRESS NOTE  Session Time: 10.01am-10.51am  Participation Level: Active  Behavioral Response: Well GroomedAlertaffect wnl  Type of Therapy: Individual Therapy  Treatment Goals addressed: Diagnosis: MDD and goal 1.  Interventions: CBT and Strength-based  Summary: Autumn [name] is a 34 y.o. female who presents with affect wnl.  Pt reported that she has been dealing w/ stress of things and had some moments of feeling overwhelmed and past couple of weeks.  Pt reported that aware she puts a lot on self w/ clean house, kids and helping w/ family.  Pt also discussed expectations from husband and family that can be overwhelming. Pt was able to identify what is in her control and not and reframe expectations that aren't healthy.  Pt identified ways of asserting no w/ other's expectations.  Suicidal/Homicidal: Nowithout intent/plan  Therapist Response: Assessed pt current functioning per pt report. Processed w/pt coping w/ stressors and reflected expectations from self and others that are too much.   Explored w/pt ways of setting healthy expectations and actions that align w/ that.  Plan: Return again in 2 weeks, via webex. F/u as scheduled w/ PCP  Diagnosis: MDD   Forde Radon [facility] 10/06/2019

## 2019-10-20 ENCOUNTER — Other Ambulatory Visit: Payer: Self-pay

## 2019-10-20 ENCOUNTER — Ambulatory Visit (HOSPITAL_COMMUNITY): Payer: Medicaid Other | Admitting: Psychology

## 2019-10-20 ENCOUNTER — Encounter (HOSPITAL_COMMUNITY): Payer: Self-pay | Admitting: Psychology

## 2019-10-20 NOTE — Progress Notes (Signed)
Autumn [name] is a 34 y.o. female patient who cancelled her appointment 1.5 hours prior to appointment time, informing of need to go to child's school.  Pt didn't reschedule.Marland Kitchen        Forde Radon, [facility]

## 2019-11-12 ENCOUNTER — Ambulatory Visit: Payer: Self-pay | Admitting: Family Medicine

## 2019-12-15 ENCOUNTER — Other Ambulatory Visit: Payer: Self-pay | Admitting: Family Medicine

## 2019-12-15 DIAGNOSIS — E119 Type 2 diabetes mellitus without complications: Secondary | ICD-10-CM

## 2020-01-24 DIAGNOSIS — S86011A Strain of right Achilles tendon, initial encounter: Secondary | ICD-10-CM

## 2020-01-24 DIAGNOSIS — M7731 Calcaneal spur, right foot: Secondary | ICD-10-CM

## 2020-01-24 HISTORY — DX: Strain of right Achilles tendon, initial encounter: S86.011A

## 2020-01-24 HISTORY — DX: Calcaneal spur, right foot: M77.31

## 2020-01-27 ENCOUNTER — Emergency Department (HOSPITAL_COMMUNITY)
Admission: EM | Admit: 2020-01-27 | Discharge: 2020-01-27 | Disposition: A | Discharge status: Home / Self Care | Payer: Medicaid Other | Attending: Emergency Medicine | Admitting: Emergency Medicine

## 2020-01-27 ENCOUNTER — Other Ambulatory Visit: Payer: Self-pay

## 2020-01-27 ENCOUNTER — Emergency Department (HOSPITAL_COMMUNITY): Payer: Medicaid Other

## 2020-01-27 ENCOUNTER — Encounter (HOSPITAL_COMMUNITY): Payer: Self-pay | Admitting: *Deleted

## 2020-01-27 DIAGNOSIS — E119 Type 2 diabetes mellitus without complications: Secondary | ICD-10-CM | POA: Diagnosis not present

## 2020-01-27 DIAGNOSIS — M7731 Calcaneal spur, right foot: Secondary | ICD-10-CM | POA: Insufficient documentation

## 2020-01-27 DIAGNOSIS — M79671 Pain in right foot: Secondary | ICD-10-CM

## 2020-01-27 DIAGNOSIS — I10 Essential (primary) hypertension: Secondary | ICD-10-CM | POA: Diagnosis not present

## 2020-01-27 MED ORDER — MELOXICAM 7.5 MG PO TABS: 7.5000 mg | ORAL_TABLET | Freq: Every day | ORAL | 0 refills | Status: AC | PRN

## 2020-01-27 NOTE — ED Triage Notes (Signed)
Pt with right heel pain for about 3 weeks, denies injury. Says today she tripped over a shoe and fell, making the pain in her foot worse.

## 2020-01-27 NOTE — ED Provider Notes (Signed)
Emergency Department Provider Note   I have reviewed the triage vital signs and the nursing notes.   HISTORY  Chief Complaint Foot Pain   HPI Autumn [name] is a 34 y.o. female with PMH reviewed below presents to the ED with pain in the right heel worse today after stumbling.  Patient has had ongoing pain in the right heel for the past several months.  She has followed with her primary care doctor and has been referred to an orthopedic specialist but has not yet received an appointment date and is waiting for a call back.  She describes pain mainly in the right heel along with more chronic pain in the right leg.  She states the leg pain has been present for several years and is not worse today.  She ambulates with discomfort but today she stumbled while walking and that seemed to flareup her heel discomfort.  She is not having pain in the mid or forefoot.  No worsening pain in her leg.  She has taken ibuprofen, naproxen, Tylenol with no relief. Denies numbness.   Past Medical History:  Diagnosis Date  . Diabetes mellitus, type II (HCC)   . Essential hypertension   . H/O varicella   . Headache   . Heart murmur   . Hypertension    stopped meds since pregnant  . Leaky heart valve    mild MR by echo  . Mini stroke (HCC)    2018   . Obese   . Prediabetes     Patient Active Problem List   Diagnosis Date Noted  . Hemoglobin A1c less than 7.0% 08/13/2019  . Class 3 severe obesity due to excess calories with serious comorbidity and body mass index (BMI) of 50.0 to 59.9 in adult (HCC) 03/07/2019  . Anxiety 12/11/2018  . Peripheral edema 12/11/2018  . Type 2 diabetes mellitus without complication (HCC) 05/03/2017  . Complicated migraine 05/02/2017  . Hypertensive urgency 05/02/2017  . Normocytic anemia 05/02/2017  . Left-sided weakness 05/02/2017  . Essential hypertension   . Myofascial pain 01/13/2016  . Weakness 01/13/2016  . Pain in limb 01/13/2016  . Chest pain at  rest 12/14/2015  . Preeclampsia complicating hypertension 11/29/2013  . Chronic hypertension with superimposed preeclampsia 11/28/2013  . Positive GBS test 11/28/2013  . Chronic headaches 11/28/2013  . Leaky heart valve 05/12/2012  . Morbid obesity (HCC) 01/22/2012    Past Surgical History:  Procedure Laterality Date  . BILATERAL SALPINGECTOMY Bilateral 11/28/2013   Procedure: BILATERAL SALPINGECTOMY;  Surgeon: Michael Litter, MD;  Location: [facility];  Service: Obstetrics;  Laterality: Bilateral;  Partial on Left  . CESAREAN SECTION  05/14/2012   Procedure: CESAREAN SECTION;  Surgeon: Hal Morales, MD;  Location: [facility];  Service: Gynecology;  Laterality: N/A;  Primary cesarean section of baby girl    at 0533 APGAR 8/9  . CESAREAN SECTION N/A 11/28/2013   Procedure: CESAREAN SECTION;  Surgeon: Michael Litter, MD;  Location: [facility];  Service: Obstetrics;  Laterality: N/A;  . CESAREAN SECTION      Allergies Patient has no known allergies.  Family History  Problem Relation Age of Onset  . Hypertension Mother   . Diabetes Mellitus II Mother   . Breast cancer Mother 62  . Heart failure Mother   . CAD Father   . Hyperlipidemia Father   . Heart disease Father   . Heart attack Father   . Hypertension Sister   . Diabetes Mellitus II  Sister   . Diabetes Sister   . Thyroid disease Sister   . Anemia Sister   . Hypertension Sister   . Depression Sister   . Heart murmur Sister   . Heart murmur Daughter   . Anesthesia problems Neg Hx     Social History Social History   Tobacco Use  . Smoking status: Never Smoker  . Smokeless tobacco: Never Used  Substance Use Topics  . Alcohol use: No  . Drug use: No    Review of Systems  Musculoskeletal: Positive right heal pain. Chronic right leg pain (not worse today).  Skin: Negative for rash. Neurological: Negative for numbness.    ____________________________________________   PHYSICAL EXAM:  VITAL SIGNS: ED Triage Vitals  [01/27/20 2211]  Enc Vitals Group     BP (!) 193/104     Pulse Rate 88     Resp 20     Temp 98.6 F (37 C)     Temp Source Oral     SpO2 99 %   Constitutional: Alert and oriented. Well appearing and in no acute distress. Cardiovascular: Good peripheral circulation.  Respiratory: Normal respiratory effort.  Gastrointestinal: No distention.  Musculoskeletal: Tenderness over the posterior calcaneous.  No tenderness over the Achilles tendon, calf, knee on the right.  No midfoot or forefoot tenderness.  Neurologic:  Normal speech and language.  Skin:  Skin is warm, dry and intact. No rash noted.  ____________________________________________  RADIOLOGY  DG Foot Complete Right  Result Date: 01/27/2020 CLINICAL DATA:  Right heel pain. EXAM: RIGHT FOOT COMPLETE - 3+ VIEW COMPARISON:  None. FINDINGS: There is no evidence of fracture or dislocation. There is no evidence of arthropathy or other focal bone abnormality. Soft tissues are unremarkable. There is a small plantar calcaneal spur. There is a probable Haglund deformity involving the posterosuperior calcaneus. IMPRESSION: 1. No acute osseous abnormality. 2. Small plantar calcaneal spur. 3. Haglund deformity of the posterosuperior calcaneus. Electronically Signed   By: Constance Holster M.D.   On: 01/27/2020 23:01    ____________________________________________   PROCEDURES  Procedure(s) performed:   Procedures  None  ____________________________________________   INITIAL IMPRESSION / ASSESSMENT AND PLAN / ED COURSE  Pertinent labs & imaging results that were available during my care of the patient were reviewed by me and considered in my medical decision making (see chart for details).   Patient with pain in the right heel after stumbling today.  Pain is acute on chronic.  Right leg pain is unchanged over the past 3 years.  No leg swelling or redness to suspect DVT.  The patient's range of motion in the ankle joint is normal.   No tenderness over the Achilles tendon.  Plan for plain film of the right foot and reassess. Patient in the process of getting ortho f/u.   Plain films reviewed and discussed with patient. Crutches fitted and provided to patient. Meloxicam given and patient to keep f/u with orhto. On call ortho contact info given as well.  ____________________________________________  FINAL CLINICAL IMPRESSION(S) / ED DIAGNOSES  Final diagnoses:  Foot pain, right  Calcaneal spur of right foot     NEW OUTPATIENT MEDICATIONS STARTED DURING THIS VISIT:  Discharge Medication List as of 01/27/2020 11:09 PM    START taking these medications   Details  meloxicam (MOBIC) 7.5 MG tablet Take 1 tablet (7.5 mg total) by mouth daily as needed for pain., Starting Tue 01/27/2020, Until Thu 02/26/2020, Normal  Note:  This document was prepared using Dragon voice recognition software and may include unintentional dictation errors.  Alona Bene, MD, [facility]    Jhayla Podgorski, Arlyss Repress, MD 01/28/20 [phone]

## 2020-01-27 NOTE — Discharge Instructions (Signed)
You were seen emergency room today with heel pain.  You do have a spur on your heel which is likely causing discomfort.  I am starting an anti-inflammatory medication.  Please call your PCP to confirm that the referral was sent to the orthopedic office.  I have attached the contact information for our orthopedic service on-call should you need to schedule with them. Use crutches as directed as needed for comfort.

## 2020-01-28 ENCOUNTER — Telehealth: Payer: Self-pay | Admitting: Family Medicine

## 2020-01-28 NOTE — Progress Notes (Signed)
Orthopedic Tech Progress Note Patient Details:  Autumn [name] [birth year] [id number]  Ortho Devices Type of Ortho Device: Crutches Ortho Device/Splint Location: Charging for ER supplies       Saul Fordyce 01/28/2020, 8:41 AM

## 2020-01-29 NOTE — Telephone Encounter (Signed)
Done

## 2020-01-30 DIAGNOSIS — M722 Plantar fascial fibromatosis: Secondary | ICD-10-CM | POA: Diagnosis not present

## 2020-02-02 ENCOUNTER — Other Ambulatory Visit: Payer: Self-pay

## 2020-02-02 ENCOUNTER — Encounter: Payer: Self-pay | Admitting: Family Medicine

## 2020-02-02 ENCOUNTER — Ambulatory Visit (INDEPENDENT_AMBULATORY_CARE_PROVIDER_SITE_OTHER): Payer: Medicaid Other | Admitting: Family Medicine

## 2020-02-02 VITALS — BP 152/88 | HR 88 | Temp 98.8°F | Ht 66.0 in

## 2020-02-02 DIAGNOSIS — S86011A Strain of right Achilles tendon, initial encounter: Secondary | ICD-10-CM

## 2020-02-02 DIAGNOSIS — R609 Edema, unspecified: Secondary | ICD-10-CM | POA: Diagnosis not present

## 2020-02-02 DIAGNOSIS — F419 Anxiety disorder, unspecified: Secondary | ICD-10-CM | POA: Diagnosis not present

## 2020-02-02 DIAGNOSIS — I10 Essential (primary) hypertension: Secondary | ICD-10-CM

## 2020-02-02 DIAGNOSIS — Z09 Encounter for follow-up examination after completed treatment for conditions other than malignant neoplasm: Secondary | ICD-10-CM

## 2020-02-02 DIAGNOSIS — M7730 Calcaneal spur, unspecified foot: Secondary | ICD-10-CM

## 2020-02-02 DIAGNOSIS — E119 Type 2 diabetes mellitus without complications: Secondary | ICD-10-CM | POA: Diagnosis not present

## 2020-02-02 DIAGNOSIS — R600 Localized edema: Secondary | ICD-10-CM

## 2020-02-02 DIAGNOSIS — R7309 Other abnormal glucose: Secondary | ICD-10-CM | POA: Diagnosis not present

## 2020-02-02 LAB — GLUCOSE, POCT (MANUAL RESULT ENTRY): POC Glucose: 92 mg/dl (ref 70–99)

## 2020-02-02 LAB — POCT URINALYSIS DIPSTICK
Bilirubin, UA: NEGATIVE
Blood, UA: NEGATIVE
Glucose, UA: NEGATIVE
Ketones, UA: NEGATIVE
Leukocytes, UA: NEGATIVE
Nitrite, UA: NEGATIVE
Protein, UA: NEGATIVE
Spec Grav, UA: 1.025 (ref 1.010–1.025)
Urobilinogen, UA: 0.2 E.U./dL
pH, UA: 6 (ref 5.0–8.0)

## 2020-02-02 LAB — POCT GLYCOSYLATED HEMOGLOBIN (HGB A1C): Hemoglobin A1C: 6.1 % — AB (ref 4.0–5.6)

## 2020-02-02 MED ORDER — FUROSEMIDE 20 MG PO TABS: 20.0000 mg | ORAL_TABLET | Freq: Every day | ORAL | 11 refills | Status: DC

## 2020-02-02 MED ORDER — HYDROCHLOROTHIAZIDE 25 MG PO TABS: 25.0000 mg | ORAL_TABLET | Freq: Every day | ORAL | 11 refills | Status: DC

## 2020-02-02 MED ORDER — LOSARTAN POTASSIUM 25 MG PO TABS: 25.0000 mg | ORAL_TABLET | Freq: Every day | ORAL | 11 refills | Status: DC

## 2020-02-02 MED ORDER — AMLODIPINE BESYLATE 10 MG PO TABS: 10.0000 mg | ORAL_TABLET | Freq: Every day | ORAL | 11 refills | Status: DC

## 2020-02-02 NOTE — Progress Notes (Signed)
[facility]    Established Patient Office Visit  Subjective:  Patient ID: Autumn [name], female    DOB: 08-18-1986  Age: 34 y.o. MRN: [id number]  CC:  Chief Complaint  Patient presents with  . Follow-up    HTN, DM  . Migraine    HPI Autumn [name] is a 34 year old female who presents for Follow Up today.   Past Medical History:  Diagnosis Date  . Diabetes mellitus, type II ([place])   . Essential hypertension   . H/O varicella   . Headache   . Heart murmur   . Heel spur, right 01/2020  . Hypertension    stopped meds since pregnant  . Leaky heart valve    mild MR by echo  . Mini stroke ([place])    2018   . Obese   . Prediabetes   . Torn Achilles tendon, right, initial encounter 01/2020   Current Status: Since her last office visit, she had a recent ED visit for Foot pain and ws diagnosed with Heel Spurs and a Torn Achilles tendon. She is currently wearing a leg boot for support. She is currently following up with Psychiatry, and last saw physician 10/2019. She has 1 hour Zoom meetings at every appointment with Jan Fireman. She states that it is really helping her anxiety and other issues. She denies suicidal ideations, homicidal ideations, or auditory hallucinations. She continues her weight loss goal. She denies visual changes, chest pain, cough, shortness of breath, heart palpitations, and falls. Her blood pressure is elevated today. She states that she has not been taking her hypertensive medications as prescribed. She has occasional headaches and dizziness with position changes. Denies severe headaches, confusion, seizures, double vision, and blurred vision, nausea and vomiting. She has not been checking her blood glucose level recently. She denies fatigue, frequent urination, blurred vision, excessive hunger, excessive thirst, weight gain, weight loss, and poor wound healing. She continues to check her feet regularly.  She denies fevers, chills, fatigue, recent infections, weight loss, and night sweats. She has not had any headaches, visual changes, dizziness, and falls. No chest pain, heart palpitations, cough and shortness of breath reported. Denies GI problems such as nausea, vomiting, diarrhea, and constipation. She has no reports of blood in stools, dysuria and hematuria. She is taking all medications as prescribed.  Past Surgical History:  Procedure Laterality Date  . BILATERAL SALPINGECTOMY Bilateral 11/28/2013   Procedure: BILATERAL SALPINGECTOMY;  Surgeon: Betsy Coder, MD;  Location: [facility];  Service: Obstetrics;  Laterality: Bilateral;  Partial on Left  . CESAREAN SECTION  05/14/2012   Procedure: CESAREAN SECTION;  Surgeon: Eldred Manges, MD;  Location: [facility];  Service: Gynecology;  Laterality: N/A;  Primary cesarean section of baby girl    at [place] 8/9  . CESAREAN SECTION N/A 11/28/2013   Procedure: CESAREAN SECTION;  Surgeon: Betsy Coder, MD;  Location: [facility];  Service: Obstetrics;  Laterality: N/A;  . CESAREAN SECTION      Family History  Problem Relation Age of Onset  . Hypertension Mother   . Diabetes Mellitus II Mother   . Breast cancer Mother 55  . Heart failure Mother   . CAD Father   . Hyperlipidemia Father   . Heart disease Father   . Heart attack Father   . Hypertension Sister   . Diabetes Mellitus II Sister   . Diabetes Sister   . Thyroid disease Sister   .  Anemia Sister   . Hypertension Sister   . Depression Sister   . Heart murmur Sister   . Heart murmur Daughter   . Anesthesia problems Neg Hx     Social History   Socioeconomic History  . Marital status: Married    Spouse name: Not on file  . Number of children: 2  . Years of education: Not on file  . Highest education level: Not on file  Occupational History  . Not on file  Tobacco Use  . Smoking status: Never Smoker  . Smokeless tobacco: Never Used  Substance and Sexual Activity  . Alcohol  use: No  . Drug use: No  . Sexual activity: Yes    Birth control/protection: None  Other Topics Concern  . Not on file  Social History Narrative   ** Merged History Encounter **       Lives with husband and 2 children in a one story home.   Works as an Building control surveyor.   Education: college degree.   Social Determinants of Health   Financial Resource Strain:   . Difficulty of Paying Living Expenses:   Food Insecurity:   . Worried About Charity fundraiser in the Last Year:   . Arboriculturist in the Last Year:   Transportation Needs:   . Film/video editor (Medical):   Marland Kitchen Lack of Transportation (Non-Medical):   Physical Activity:   . Days of Exercise per Week:   . Minutes of Exercise per Session:   Stress:   . Feeling of Stress :   Social Connections:   . Frequency of Communication with Friends and Family:   . Frequency of Social Gatherings with Friends and Family:   . Attends Religious Services:   . Active Member of Clubs or Organizations:   . Attends Archivist Meetings:   Marland Kitchen Marital Status:   Intimate Partner Violence:   . Fear of Current or Ex-Partner:   . Emotionally Abused:   Marland Kitchen Physically Abused:   . Sexually Abused:     Outpatient Medications Prior to Visit  Medication Sig Dispense Refill  . ibuprofen (ADVIL,MOTRIN) 800 MG tablet Take 1 tablet (800 mg total) by mouth every 8 (eight) hours as needed for moderate pain. 21 tablet 0  . meloxicam (MOBIC) 7.5 MG tablet Take 1 tablet (7.5 mg total) by mouth daily as needed for pain. 30 tablet 0  . metFORMIN (GLUCOPHAGE) 500 MG tablet Take 1 tablet (500 mg total) by mouth daily. 30 tablet 3  . TRULICITY 1.61 WR/6.0AV SOPN INJECT 0.75 MG INTO THE SKIN ONCE A WEEK. 2 pen 3  . amLODipine (NORVASC) 10 MG tablet Take 1 tablet (10 mg total) by mouth daily. 30 tablet 2  . furosemide (LASIX) 20 MG tablet Take 1 tablet (20 mg total) by mouth daily. 30 tablet 2  . hydrochlorothiazide (HYDRODIURIL) 25 MG tablet Take  1 tablet (25 mg total) by mouth daily. 30 tablet 2  . losartan (COZAAR) 25 MG tablet Take 1 tablet (25 mg total) by mouth daily. 30 tablet 2  . acetaminophen (TYLENOL) 500 MG tablet Take 500 mg by mouth every 6 (six) hours as needed for mild pain or headache.    Marland Kitchen aspirin EC 325 MG tablet Take 325 mg by mouth daily.    Marland Kitchen atorvastatin (LIPITOR) 10 MG tablet Take 10 mg by mouth daily.  1  . bisoprolol (ZEBETA) 5 MG tablet Take 1 tablet (5 mg total) by mouth daily.  30 tablet 2  . blood glucose meter kit and supplies Dispense based on patient and insurance preference. Use up to four times daily as directed. (FOR ICD-9 250.00, 250.01). 1 each 0  . busPIRone (BUSPAR) 10 MG tablet Take 1 tablet (10 mg total) by mouth 2 (two) times daily. (Patient not taking: Reported on 08/12/2019) 60 tablet 3  . ferrous sulfate (FERROUSUL) 325 (65 FE) MG tablet Take 1 tablet (325 mg total) by mouth daily with breakfast. 30 tablet 0  . cyclobenzaprine (FLEXERIL) 10 MG tablet Take 1 tablet (10 mg total) by mouth 3 (three) times daily as needed for muscle spasms. (Patient not taking: Reported on 08/12/2019) 30 tablet 1  . gabapentin (NEURONTIN) 100 MG capsule Take 2 capsule (200 mg total) twice daily. (Patient not taking: Reported on 02/02/2020) 120 capsule 2   No facility-administered medications prior to visit.    No Known Allergies  ROS Review of Systems  Constitutional: Negative.   HENT: Negative.   Eyes: Negative.   Respiratory: Negative.   Cardiovascular: Negative.   Gastrointestinal: Positive for abdominal distention.  Endocrine: Negative.   Genitourinary: Negative.   Musculoskeletal: Positive for arthralgias (foot pain).  Skin: Negative.   Allergic/Immunologic: Negative.   Neurological: Positive for dizziness (occasional ) and headaches (occasional ).  Hematological: Negative.   Psychiatric/Behavioral: Negative.       Objective:    Physical Exam  Constitutional: She is oriented to person, place,  and time. She appears well-developed and well-nourished.  HENT:  Head: Normocephalic and atraumatic.  Eyes: Conjunctivae are normal.  Cardiovascular: Normal rate, regular rhythm, normal heart sounds and intact distal pulses.  Pulmonary/Chest: Effort normal and breath sounds normal.  Musculoskeletal:     Cervical back: Normal range of motion and neck supple.     Comments: Decreased ROM in right lower extremity  Neurological: She is alert and oriented to person, place, and time.  Skin: Skin is warm and dry.  Nursing note and vitals reviewed.   BP (!) 152/88   Pulse 88   Temp 98.8 F (37.1 C)   Ht 5' 6"  (1.676 m)   LMP 01/08/2020   SpO2 98%   BMI 55.46 kg/m  Wt Readings from Last 3 Encounters:  08/12/19 (!) 343 lb 9.6 oz (155.9 kg)  05/01/17 (!) 344 lb 12.8 oz (156.4 kg)  02/04/16 (!) 312 lb 6 oz (141.7 kg)     Health Maintenance Due  Topic Date Due  . PNEUMOCOCCAL POLYSACCHARIDE VACCINE AGE 58-64 HIGH RISK  Never done  . FOOT EXAM  Never done  . OPHTHALMOLOGY EXAM  Never done  . COVID-19 Vaccine (1) Never done  . PAP SMEAR-Modifier  06/04/2008    There are no preventive care reminders to display for this patient.  Lab Results  Component Value Date   TSH 2.850 11/13/2018   Lab Results  Component Value Date   WBC 6.0 11/13/2018   HGB 11.5 11/13/2018   HCT 35.4 11/13/2018   MCV 74 (L) 11/13/2018   PLT 341 11/13/2018   Lab Results  Component Value Date   NA 141 08/28/2017   K 4.5 08/28/2017   CO2 22 08/28/2017   GLUCOSE 99 08/28/2017   BUN 8 08/28/2017   CREATININE 0.67 08/28/2017   BILITOT 0.4 05/01/2017   ALKPHOS 91 05/01/2017   AST 22 05/01/2017   ALT 20 05/01/2017   PROT 8.1 05/01/2017   ALBUMIN 3.2 (L) 05/01/2017   CALCIUM 9.2 08/28/2017   ANIONGAP 7 05/03/2017  Lab Results  Component Value Date   CHOL 186 11/13/2018   Lab Results  Component Value Date   HDL 49 11/13/2018   Lab Results  Component Value Date   LDLCALC 114 (H) 11/13/2018    Lab Results  Component Value Date   TRIG 115 11/13/2018   Lab Results  Component Value Date   CHOLHDL 3.8 11/13/2018   Lab Results  Component Value Date   HGBA1C 6.1 (A) 02/02/2020      Assessment & Plan:   1. Hospital discharge follow-up  2. Heel spur, unspecified laterality - AMB referral to orthopedics  3. Torn Achilles tendon, right, initial encounter - AMB referral to orthopedics  4. Type 2 diabetes mellitus without complication, without long-term current use of insulin (HCC) She will continue medication as prescribed, to decrease foods/beverages high in sugars and carbs and follow Heart Healthy or DASH diet. Increase physical activity to at least 30 minutes cardio exercise daily.  - POCT urinalysis dipstick - POCT glucose (manual entry) - POCT glycosylated hemoglobin (Hb A1C)  5. Hemoglobin A1c less than 7.0% Hgb A1c is stable at 6.1 today. Monitor.   6. Essential hypertension Blood pressure is stable. She will continue to take medications as prescribed, to decrease high sodium intake, excessive alcohol intake, increase potassium intake, smoking cessation, and increase physical activity of at least 30 minutes of cardio activity daily. She will continue to follow Heart Healthy or DASH diet. - amLODipine (NORVASC) 10 MG tablet; Take 1 tablet (10 mg total) by mouth daily.  Dispense: 30 tablet; Refill: 11 - furosemide (LASIX) 20 MG tablet; Take 1 tablet (20 mg total) by mouth daily.  Dispense: 30 tablet; Refill: 11 - hydrochlorothiazide (HYDRODIURIL) 25 MG tablet; Take 1 tablet (25 mg total) by mouth daily.  Dispense: 30 tablet; Refill: 11 - losartan (COZAAR) 25 MG tablet; Take 1 tablet (25 mg total) by mouth daily.  Dispense: 30 tablet; Refill: 11  7. Peripheral edema - furosemide (LASIX) 20 MG tablet; Take 1 tablet (20 mg total) by mouth daily.  Dispense: 30 tablet; Refill: 11  8. Anxiety Moderate today.   9. Follow up She will follow up in 1 month.   Meds  ordered this encounter  Medications  . amLODipine (NORVASC) 10 MG tablet    Sig: Take 1 tablet (10 mg total) by mouth daily.    Dispense:  30 tablet    Refill:  11  . furosemide (LASIX) 20 MG tablet    Sig: Take 1 tablet (20 mg total) by mouth daily.    Dispense:  30 tablet    Refill:  11  . hydrochlorothiazide (HYDRODIURIL) 25 MG tablet    Sig: Take 1 tablet (25 mg total) by mouth daily.    Dispense:  30 tablet    Refill:  11  . losartan (COZAAR) 25 MG tablet    Sig: Take 1 tablet (25 mg total) by mouth daily.    Dispense:  30 tablet    Refill:  11    Orders Placed This Encounter  Procedures  . AMB referral to orthopedics  . POCT urinalysis dipstick  . POCT glucose (manual entry)  . POCT glycosylated hemoglobin (Hb A1C)    Referral Orders     AMB referral to orthopedics   Kathe Becton,  MSN, FNP-BC [facility] [address] [place], [place] [address] [phone] fax   Problem List Items Addressed This Visit  Cardiovascular and Mediastinum   Essential hypertension   Relevant Medications   amLODipine (NORVASC) 10 MG tablet   furosemide (LASIX) 20 MG tablet   hydrochlorothiazide (HYDRODIURIL) 25 MG tablet   losartan (COZAAR) 25 MG tablet     Endocrine   Type 2 diabetes mellitus without complication (HCC)   Relevant Medications   losartan (COZAAR) 25 MG tablet   Other Relevant Orders   POCT urinalysis dipstick (Completed)   POCT glucose (manual entry) (Completed)   POCT glycosylated hemoglobin (Hb A1C) (Completed)     Other   Anxiety   Hemoglobin A1c less than 7.0%   Peripheral edema   Relevant Medications   furosemide (LASIX) 20 MG tablet    Other Visit Diagnoses    Hospital discharge follow-up    -  Primary   Heel spur, unspecified laterality       Relevant Orders   AMB referral to orthopedics   Torn Achilles tendon, right, initial encounter       Relevant  Orders   AMB referral to orthopedics   Follow up          Meds ordered this encounter  Medications  . amLODipine (NORVASC) 10 MG tablet    Sig: Take 1 tablet (10 mg total) by mouth daily.    Dispense:  30 tablet    Refill:  11  . furosemide (LASIX) 20 MG tablet    Sig: Take 1 tablet (20 mg total) by mouth daily.    Dispense:  30 tablet    Refill:  11  . hydrochlorothiazide (HYDRODIURIL) 25 MG tablet    Sig: Take 1 tablet (25 mg total) by mouth daily.    Dispense:  30 tablet    Refill:  11  . losartan (COZAAR) 25 MG tablet    Sig: Take 1 tablet (25 mg total) by mouth daily.    Dispense:  30 tablet    Refill:  11    Follow-up: Return in about 1 month (around 03/04/2020).    Azzie Glatter, FNP

## 2020-02-02 NOTE — Patient Instructions (Signed)
DASH Eating Plan DASH stands for "Dietary Approaches to Stop Hypertension." The DASH eating plan is a healthy eating plan that has been shown to reduce high blood pressure (hypertension). It may also reduce your risk for type 2 diabetes, heart disease, and stroke. The DASH eating plan may also help with weight loss. What are tips for following this plan?  General guidelines  Avoid eating more than 2,300 mg (milligrams) of salt (sodium) a day. If you have hypertension, you may need to reduce your sodium intake to 1,500 mg a day.  Limit alcohol intake to no more than 1 drink a day for nonpregnant women and 2 drinks a day for men. One drink equals 12 oz of beer, 5 oz of wine, or 1 oz of hard liquor.  Work with your health care provider to maintain a healthy body weight or to lose weight. Ask what an ideal weight is for you.  Get at least 30 minutes of exercise that causes your heart to beat faster (aerobic exercise) most days of the week. Activities may include walking, swimming, or biking.  Work with your health care provider or diet and nutrition specialist (dietitian) to adjust your eating plan to your individual calorie needs. Reading food labels   Check food labels for the amount of sodium per serving. Choose foods with less than 5 percent of the Daily Value of sodium. Generally, foods with less than 300 mg of sodium per serving fit into this eating plan.  To find whole grains, look for the word "whole" as the first word in the ingredient list. Shopping  Buy products labeled as "low-sodium" or "no salt added."  Buy fresh foods. Avoid canned foods and premade or frozen meals. Cooking  Avoid adding salt when cooking. Use salt-free seasonings or herbs instead of table salt or sea salt. Check with your health care provider or pharmacist before using salt substitutes.  Do not fry foods. Cook foods using healthy methods such as baking, boiling, grilling, and broiling instead.  Cook with  heart-healthy oils, such as olive, canola, soybean, or sunflower oil. Meal planning  Eat a balanced diet that includes: ? 5 or more servings of fruits and vegetables each day. At each meal, try to fill half of your plate with fruits and vegetables. ? Up to 6-8 servings of whole grains each day. ? Less than 6 oz of lean meat, poultry, or fish each day. A 3-oz serving of meat is about the same size as a deck of cards. One egg equals 1 oz. ? 2 servings of low-fat dairy each day. ? A serving of nuts, seeds, or beans 5 times each week. ? Heart-healthy fats. Healthy fats called Omega-3 fatty acids are found in foods such as flaxseeds and coldwater fish, like sardines, salmon, and mackerel.  Limit how much you eat of the following: ? Canned or prepackaged foods. ? Food that is high in trans fat, such as fried foods. ? Food that is high in saturated fat, such as fatty meat. ? Sweets, desserts, sugary drinks, and other foods with added sugar. ? Full-fat dairy products.  Do not salt foods before eating.  Try to eat at least 2 vegetarian meals each week.  Eat more home-cooked food and less restaurant, buffet, and fast food.  When eating at a restaurant, ask that your food be prepared with less salt or no salt, if possible. What foods are recommended? The items listed may not be a complete list. Talk with your dietitian about   what dietary choices are best for you. Grains Whole-grain or whole-wheat bread. Whole-grain or whole-wheat pasta. Brown rice. Oatmeal. Quinoa. Bulgur. Whole-grain and low-sodium cereals. Pita bread. Low-fat, low-sodium crackers. Whole-wheat flour tortillas. Vegetables Fresh or frozen vegetables (raw, steamed, roasted, or grilled). Low-sodium or reduced-sodium tomato and vegetable juice. Low-sodium or reduced-sodium tomato sauce and tomato paste. Low-sodium or reduced-sodium canned vegetables. Fruits All fresh, dried, or frozen fruit. Canned fruit in natural juice (without  added sugar). Meat and other protein foods Skinless chicken or turkey. Ground chicken or turkey. Pork with fat trimmed off. Fish and seafood. Egg whites. Dried beans, peas, or lentils. Unsalted nuts, nut butters, and seeds. Unsalted canned beans. Lean cuts of beef with fat trimmed off. Low-sodium, lean deli meat. Dairy Low-fat (1%) or fat-free (skim) milk. Fat-free, low-fat, or reduced-fat cheeses. Nonfat, low-sodium ricotta or cottage cheese. Low-fat or nonfat yogurt. Low-fat, low-sodium cheese. Fats and oils Soft margarine without trans fats. Vegetable oil. Low-fat, reduced-fat, or light mayonnaise and salad dressings (reduced-sodium). Canola, safflower, olive, soybean, and sunflower oils. Avocado. Seasoning and other foods Herbs. Spices. Seasoning mixes without salt. Unsalted popcorn and pretzels. Fat-free sweets. What foods are not recommended? The items listed may not be a complete list. Talk with your dietitian about what dietary choices are best for you. Grains Baked goods made with fat, such as croissants, muffins, or some breads. Dry pasta or rice meal packs. Vegetables Creamed or fried vegetables. Vegetables in a cheese sauce. Regular canned vegetables (not low-sodium or reduced-sodium). Regular canned tomato sauce and paste (not low-sodium or reduced-sodium). Regular tomato and vegetable juice (not low-sodium or reduced-sodium). Pickles. Olives. Fruits Canned fruit in a light or heavy syrup. Fried fruit. Fruit in cream or butter sauce. Meat and other protein foods Fatty cuts of meat. Ribs. Fried meat. Bacon. Sausage. Bologna and other processed lunch meats. Salami. Fatback. Hotdogs. Bratwurst. Salted nuts and seeds. Canned beans with added salt. Canned or smoked fish. Whole eggs or egg yolks. Chicken or turkey with skin. Dairy Whole or 2% milk, cream, and half-and-half. Whole or full-fat cream cheese. Whole-fat or sweetened yogurt. Full-fat cheese. Nondairy creamers. Whipped toppings.  Processed cheese and cheese spreads. Fats and oils Butter. Stick margarine. Lard. Shortening. Ghee. Bacon fat. Tropical oils, such as coconut, palm kernel, or palm oil. Seasoning and other foods Salted popcorn and pretzels. Onion salt, garlic salt, seasoned salt, table salt, and sea salt. Worcestershire sauce. Tartar sauce. Barbecue sauce. Teriyaki sauce. Soy sauce, including reduced-sodium. Steak sauce. Canned and packaged gravies. Fish sauce. Oyster sauce. Cocktail sauce. Horseradish that you find on the shelf. Ketchup. Mustard. Meat flavorings and tenderizers. Bouillon cubes. Hot sauce and Tabasco sauce. Premade or packaged marinades. Premade or packaged taco seasonings. Relishes. Regular salad dressings. Where to find more information:  National Heart, Lung, and Blood Institute: www.nhlbi.nih.gov  American Heart Association: www.heart.org Summary  The DASH eating plan is a healthy eating plan that has been shown to reduce high blood pressure (hypertension). It may also reduce your risk for type 2 diabetes, heart disease, and stroke.  With the DASH eating plan, you should limit salt (sodium) intake to 2,300 mg a day. If you have hypertension, you may need to reduce your sodium intake to 1,500 mg a day.  When on the DASH eating plan, aim to eat more fresh fruits and vegetables, whole grains, lean proteins, low-fat dairy, and heart-healthy fats.  Work with your health care provider or diet and nutrition specialist (dietitian) to adjust your eating plan to your   individual calorie needs. This information is not intended to replace advice given to you by your health care provider. Make sure you discuss any questions you have with your health care provider. Document Revised: 08/24/2017 Document Reviewed: 09/04/2016 Elsevier Patient Education  2020 Elsevier Inc.  

## 2020-02-03 ENCOUNTER — Encounter (HOSPITAL_COMMUNITY): Payer: Self-pay | Admitting: Psychology

## 2020-02-09 ENCOUNTER — Ambulatory Visit: Payer: Self-pay | Admitting: Family Medicine

## 2020-02-10 NOTE — Progress Notes (Signed)
Autumn [name] is a 34 y.o. female patient who is discharged from counseling as hasn't been seen for  Counseling in over 90 days.        Forde Radon, [facility]

## 2020-02-24 DIAGNOSIS — E559 Vitamin D deficiency, unspecified: Secondary | ICD-10-CM

## 2020-02-24 HISTORY — DX: Vitamin D deficiency, unspecified: E55.9

## 2020-03-09 ENCOUNTER — Other Ambulatory Visit: Payer: Self-pay

## 2020-03-09 ENCOUNTER — Encounter: Payer: Self-pay | Admitting: Family Medicine

## 2020-03-09 ENCOUNTER — Ambulatory Visit (INDEPENDENT_AMBULATORY_CARE_PROVIDER_SITE_OTHER): Payer: Medicaid Other | Admitting: Family Medicine

## 2020-03-09 VITALS — BP 152/86 | HR 83 | Temp 98.2°F | Ht 66.0 in | Wt 342.0 lb

## 2020-03-09 DIAGNOSIS — Z6841 Body Mass Index (BMI) 40.0 and over, adult: Secondary | ICD-10-CM | POA: Diagnosis not present

## 2020-03-09 DIAGNOSIS — R739 Hyperglycemia, unspecified: Secondary | ICD-10-CM | POA: Diagnosis not present

## 2020-03-09 DIAGNOSIS — S86011A Strain of right Achilles tendon, initial encounter: Secondary | ICD-10-CM

## 2020-03-09 DIAGNOSIS — R635 Abnormal weight gain: Secondary | ICD-10-CM | POA: Diagnosis not present

## 2020-03-09 DIAGNOSIS — E119 Type 2 diabetes mellitus without complications: Secondary | ICD-10-CM

## 2020-03-09 DIAGNOSIS — I10 Essential (primary) hypertension: Secondary | ICD-10-CM | POA: Diagnosis not present

## 2020-03-09 DIAGNOSIS — R609 Edema, unspecified: Secondary | ICD-10-CM | POA: Diagnosis not present

## 2020-03-09 DIAGNOSIS — Z09 Encounter for follow-up examination after completed treatment for conditions other than malignant neoplasm: Secondary | ICD-10-CM

## 2020-03-09 DIAGNOSIS — F419 Anxiety disorder, unspecified: Secondary | ICD-10-CM

## 2020-03-09 DIAGNOSIS — R600 Localized edema: Secondary | ICD-10-CM

## 2020-03-09 DIAGNOSIS — E66813 Obesity, class 3: Secondary | ICD-10-CM

## 2020-03-09 DIAGNOSIS — R7309 Other abnormal glucose: Secondary | ICD-10-CM

## 2020-03-09 LAB — POCT URINALYSIS DIPSTICK
Bilirubin, UA: NEGATIVE
Blood, UA: NEGATIVE
Glucose, UA: NEGATIVE
Ketones, UA: NEGATIVE
Leukocytes, UA: NEGATIVE
Nitrite, UA: NEGATIVE
Protein, UA: NEGATIVE
Spec Grav, UA: >=1.03 — AB (ref 1.010–1.025)
Urobilinogen, UA: 0.2 E.U./dL
pH, UA: 5 (ref 5.0–8.0)

## 2020-03-09 LAB — GLUCOSE, POCT (MANUAL RESULT ENTRY): POC Glucose: 144 mg/dl — AB (ref 70–99)

## 2020-03-09 NOTE — Progress Notes (Signed)
Patient [facility]    Established Patient Office Visit  Subjective:  Patient ID: Autumn [name], female    DOB: [birth year]  Age: 34 y.o. MRN: [id number]  CC:  Chief Complaint  Patient presents with  . Follow-up    1 month follow up; leg pain   . Dizziness    Started late week; some SOB as well    HPI Autumn [name] is a 34 year old female who presents for Follow Up today.    Patient Active Problem List   Diagnosis Date Noted  . Heel spur, unspecified laterality 02/02/2020  . Hemoglobin A1c less than 7.0% 08/13/2019  . Class 3 severe obesity due to excess calories with serious comorbidity and body mass index (BMI) of 50.0 to 59.9 in adult ([place]) 03/07/2019  . Anxiety 12/11/2018  . Peripheral edema 12/11/2018  . Type 2 diabetes mellitus without complication ([place]) 85/46/2703  . Complicated migraine 50/05/3817  . Hypertensive urgency 05/02/2017  . Normocytic anemia 05/02/2017  . Left-sided weakness 05/02/2017  . Essential hypertension   . Myofascial pain 01/13/2016  . Weakness 01/13/2016  . Pain in limb 01/13/2016  . Chest pain at rest 12/14/2015  . Preeclampsia complicating hypertension 11/29/2013  . Chronic hypertension with superimposed preeclampsia 11/28/2013  . Positive GBS test 11/28/2013  . Chronic headaches 11/28/2013  . Leaky heart valve 05/12/2012  . Morbid obesity ([place]) 01/22/2012     Past Medical History:  Diagnosis Date  . Diabetes mellitus, type II ([place])   . Essential hypertension   . H/O varicella   . Headache   . Heart murmur   . Heel spur, right 01/2020  . Hypertension    stopped meds since pregnant  . Leaky heart valve    mild MR by echo  . Mini stroke ([place])    2018   . Obese   . Prediabetes   . Torn Achilles tendon, right, initial encounter 01/2020  . Vitamin D deficiency 02/2020   Current Status: Since her last office visit, she is doing well with no complaints. Her anxiety is  mild today. She has missed a few  appointments with Dr. Lorin Mercy, Psychiatry and has been dismissed from office at this time. Patient states that she has had schedule conflicts with PT appointments. She admits to occasional shortness of breath on exertion. She continues to work on her goal towards weight loss. She denies suicidal ideations, homicidal ideations, or auditory hallucinations. She denies fevers, chills, fatigue, recent infections, weight loss, and night sweats. She has not had any headaches, visual changes, dizziness, and falls. No chest pain, heart palpitations, cough and shortness of breath reported. Denies GI problems such as nausea, vomiting, diarrhea, and constipation. She has no reports of blood in stools, dysuria and hematuria. She is taking all medications as prescribed. She denies pain today.   Past Surgical History:  Procedure Laterality Date  . BILATERAL SALPINGECTOMY Bilateral 11/28/2013   Procedure: BILATERAL SALPINGECTOMY;  Surgeon: Betsy Coder, MD;  Location: [facility];  Service: Obstetrics;  Laterality: Bilateral;  Partial on Left  . CESAREAN SECTION  05/14/2012   Procedure: CESAREAN SECTION;  Surgeon: Eldred Manges, MD;  Location: [facility];  Service: Gynecology;  Laterality: N/A;  Primary cesarean section of baby girl    at [place] 8/9  . CESAREAN SECTION N/A 11/28/2013   Procedure: CESAREAN SECTION;  Surgeon: Betsy Coder, MD;  Location: [facility];  Service: Obstetrics;  Laterality: N/A;  .  CESAREAN SECTION      Family History  Problem Relation Age of Onset  . Hypertension Mother   . Diabetes Mellitus II Mother   . Breast cancer Mother 11  . Heart failure Mother   . CAD Father   . Hyperlipidemia Father   . Heart disease Father   . Heart attack Father   . Hypertension Sister   . Diabetes Mellitus II Sister   . Diabetes Sister   . Thyroid disease Sister   . Anemia Sister   . Hypertension Sister   . Depression Sister   . Heart murmur Sister   . Heart murmur  Daughter   . Anesthesia problems Neg Hx     Social History   Socioeconomic History  . Marital status: Married    Spouse name: Not on file  . Number of children: 2  . Years of education: Not on file  . Highest education level: Not on file  Occupational History  . Not on file  Tobacco Use  . Smoking status: Never Smoker  . Smokeless tobacco: Never Used  Vaping Use  . Vaping Use: Never used  Substance and Sexual Activity  . Alcohol use: No  . Drug use: No  . Sexual activity: Yes    Birth control/protection: None  Other Topics Concern  . Not on file  Social History Narrative   ** Merged History Encounter **       Lives with husband and 2 children in a one story home.   Works as an Building control surveyor.   Education: college degree.   Social Determinants of Health   Financial Resource Strain:   . Difficulty of Paying Living Expenses:   Food Insecurity:   . Worried About Charity fundraiser in the Last Year:   . Arboriculturist in the Last Year:   Transportation Needs:   . Film/video editor (Medical):   Marland Kitchen Lack of Transportation (Non-Medical):   Physical Activity:   . Days of Exercise per Week:   . Minutes of Exercise per Session:   Stress:   . Feeling of Stress :   Social Connections:   . Frequency of Communication with Friends and Family:   . Frequency of Social Gatherings with Friends and Family:   . Attends Religious Services:   . Active Member of Clubs or Organizations:   . Attends Archivist Meetings:   Marland Kitchen Marital Status:   Intimate Partner Violence:   . Fear of Current or Ex-Partner:   . Emotionally Abused:   Marland Kitchen Physically Abused:   . Sexually Abused:     Outpatient Medications Prior to Visit  Medication Sig Dispense Refill  . acetaminophen (TYLENOL) 500 MG tablet Take 500 mg by mouth every 6 (six) hours as needed for mild pain or headache.    Marland Kitchen amLODipine (NORVASC) 10 MG tablet Take 1 tablet (10 mg total) by mouth daily. 30 tablet 11  .  aspirin EC 325 MG tablet Take 325 mg by mouth daily.    . benzonatate (TESSALON) 100 MG capsule Take by mouth every 8 (eight) hours.    . blood glucose meter kit and supplies Dispense based on patient and insurance preference. Use up to four times daily as directed. (FOR ICD-9 250.00, 250.01). 1 each 0  . furosemide (LASIX) 20 MG tablet Take 1 tablet (20 mg total) by mouth daily. 30 tablet 11  . hydrochlorothiazide (HYDRODIURIL) 25 MG tablet Take 1 tablet (25 mg total) by mouth  daily. 30 tablet 11  . losartan (COZAAR) 25 MG tablet Take 1 tablet (25 mg total) by mouth daily. 30 tablet 11  . meloxicam (MOBIC) 15 MG tablet Take 15 mg by mouth daily.    . metFORMIN (GLUCOPHAGE) 500 MG tablet Take 1 tablet (500 mg total) by mouth daily. 30 tablet 3  . TRULICITY 8.24 MP/5.3IR SOPN INJECT 0.75 MG INTO THE SKIN ONCE A WEEK. 2 pen 3  . atorvastatin (LIPITOR) 10 MG tablet Take 10 mg by mouth daily. (Patient not taking: Reported on 03/09/2020)  1  . bisoprolol (ZEBETA) 5 MG tablet Take 1 tablet (5 mg total) by mouth daily. (Patient not taking: Reported on 03/09/2020) 30 tablet 2  . busPIRone (BUSPAR) 10 MG tablet Take 1 tablet (10 mg total) by mouth 2 (two) times daily. (Patient not taking: Reported on 03/09/2020) 60 tablet 3  . ferrous sulfate (FERROUSUL) 325 (65 FE) MG tablet Take 1 tablet (325 mg total) by mouth daily with breakfast. (Patient not taking: Reported on 03/09/2020) 30 tablet 0  . ibuprofen (ADVIL,MOTRIN) 800 MG tablet Take 1 tablet (800 mg total) by mouth every 8 (eight) hours as needed for moderate pain. (Patient not taking: Reported on 03/09/2020) 21 tablet 0   No facility-administered medications prior to visit.    No Known Allergies  ROS Review of Systems  Constitutional: Negative.   HENT: Negative.   Eyes: Negative.   Respiratory: Negative.   Cardiovascular: Negative.   Gastrointestinal: Positive for abdominal distention.  Endocrine: Negative.   Genitourinary: Negative.     Musculoskeletal: Positive for arthralgias (generalized joint pain).  Skin: Negative.   Allergic/Immunologic: Negative.   Neurological: Positive for dizziness (occasional ) and headaches (occasional ).  Hematological: Negative.   Psychiatric/Behavioral: Negative.       Objective:    Physical Exam Vitals and nursing note reviewed.  Constitutional:      Appearance: Normal appearance.  HENT:     Head: Normocephalic and atraumatic.     Mouth/Throat:     Mouth: Mucous membranes are dry.  Cardiovascular:     Rate and Rhythm: Normal rate and regular rhythm.     Pulses: Normal pulses.     Heart sounds: Normal heart sounds.  Pulmonary:     Effort: Pulmonary effort is normal.     Breath sounds: Normal breath sounds.  Abdominal:     General: Bowel sounds are normal. There is distension (obese).     Palpations: Abdomen is soft.  Musculoskeletal:        General: Normal range of motion.     Cervical back: Normal range of motion and neck supple.  Skin:    General: Skin is warm and dry.  Neurological:     General: No focal deficit present.     Mental Status: She is alert and oriented to person, place, and time.  Psychiatric:        Mood and Affect: Mood normal.        Behavior: Behavior normal.        Thought Content: Thought content normal.        Judgment: Judgment normal.     BP (!) 152/86   Pulse 83   Temp 98.2 F (36.8 C)   Ht 5' 6"  (1.676 m)   Wt (!) 342 lb 0.2 oz (155.1 kg)   SpO2 100%   BMI 55.20 kg/m  Wt Readings from Last 3 Encounters:  03/09/20 (!) 342 lb 0.2 oz (155.1 kg)  08/12/19 (!) 343  lb 9.6 oz (155.9 kg)  05/01/17 (!) 344 lb 12.8 oz (156.4 kg)     Health Maintenance Due  Topic Date Due  . PNEUMOCOCCAL POLYSACCHARIDE VACCINE AGE 48-64 HIGH RISK  Never done  . FOOT EXAM  Never done  . OPHTHALMOLOGY EXAM  Never done  . COVID-19 Vaccine (1) Never done  . PAP SMEAR-Modifier  06/04/2008    There are no preventive care reminders to display for this  patient.  Lab Results  Component Value Date   TSH 2.470 03/09/2020   Lab Results  Component Value Date   WBC 6.1 03/09/2020   HGB 11.9 03/09/2020   HCT 37.7 03/09/2020   MCV 81 03/09/2020   PLT 327 03/09/2020   Lab Results  Component Value Date   NA 141 08/28/2017   K 4.5 08/28/2017   CO2 22 08/28/2017   GLUCOSE 99 08/28/2017   BUN 8 08/28/2017   CREATININE 0.67 08/28/2017   BILITOT 0.4 05/01/2017   ALKPHOS 91 05/01/2017   AST 22 05/01/2017   ALT 20 05/01/2017   PROT 8.1 05/01/2017   ALBUMIN 3.2 (L) 05/01/2017   CALCIUM 9.2 08/28/2017   ANIONGAP 7 05/03/2017   Lab Results  Component Value Date   CHOL 175 03/09/2020   Lab Results  Component Value Date   HDL 48 03/09/2020   Lab Results  Component Value Date   LDLCALC 112 (H) 03/09/2020   Lab Results  Component Value Date   TRIG 79 03/09/2020   Lab Results  Component Value Date   CHOLHDL 3.6 03/09/2020   Lab Results  Component Value Date   HGBA1C 6.1 (A) 02/02/2020      Assessment & Plan:   1. Type 2 diabetes mellitus without complication, without long-term current use of insulin (HCC) She will continue medication as prescribed, to decrease foods/beverages high in sugars and carbs and follow Heart Healthy or DASH diet. Increase physical activity to at least 30 minutes cardio exercise daily.  - Urinalysis Dipstick - Glucose (CBG) - CBC with Differential - Comprehensive metabolic panel; Future - Lipid Panel - TSH - Vitamin B12 - Vitamin D, 25-hydroxy  2. Hemoglobin A1c less than 7.0% Hgb A1c stable at 6.1 today. Monitor.   3. Hyperglycemia  4. Essential hypertension Blood pressures moderately elevated today. She will continue to take medications as prescribed, to decrease high sodium intake, excessive alcohol intake, increase potassium intake, smoking cessation, and increase physical activity of at least 30 minutes of cardio activity daily. She will continue to follow Heart Healthy or DASH  diet. - Urinalysis Dipstick  5. Torn Achilles tendon, right, initial encounter Stable. Continues to wear boot.   6. Anxiety  7. Peripheral edema Stable. Not worsening.   8. Weight gain Significant weight gain over the past year. We will continue to encourage and refer patient as needed for resources towards her goal of a healthier weight.   9. Class 3 severe obesity due to excess calories with serious comorbidity and body mass index (BMI) of 50.0 to 59.9 in adult [facility]) - Amb Referral to Bariatric Surgery  10. Morbid obesity ([place]) - Amb Referral to Bariatric Surgery  11. Follow up She will follow up in 6 months.   No orders of the defined types were placed in this encounter.   Orders Placed This Encounter  Procedures  . CBC with Differential  . Comprehensive metabolic panel  . Lipid Panel  . TSH  . Vitamin B12  . Vitamin D, 25-hydroxy  .  Amb Referral to Bariatric Surgery  . Urinalysis Dipstick  . Glucose (CBG)     Referral Orders     Amb Referral to Bariatric Surgery   Kathe Becton,  MSN, FNP-BC [address] [place], [place] [address] [phone] fax   Problem List Items Addressed This Visit      Cardiovascular and Mediastinum   Essential hypertension   Relevant Orders   Urinalysis Dipstick (Completed)     Endocrine   Type 2 diabetes mellitus without complication ([place]) - Primary   Relevant Orders   Urinalysis Dipstick (Completed)   Glucose (CBG) (Completed)   CBC with Differential (Completed)   Comprehensive metabolic panel   Lipid Panel (Completed)   TSH (Completed)   Vitamin B12 (Completed)   Vitamin D, 25-hydroxy (Completed)     Other   Anxiety   Class 3 severe obesity due to excess calories with serious comorbidity and body mass index (BMI) of 50.0 to 59.9 in adult [facility])   Relevant Orders   Amb Referral to Bariatric Surgery    Hemoglobin A1c less than 7.0%   Morbid obesity (HCC)   Relevant Orders   Amb Referral to Bariatric Surgery   Peripheral edema    Other Visit Diagnoses    Hyperglycemia       Torn Achilles tendon, right, initial encounter       Weight gain       Follow up          No orders of the defined types were placed in this encounter.   Follow-up: Return in about 3 months (around 06/09/2020).    Azzie Glatter, FNP

## 2020-03-10 LAB — CBC WITH DIFFERENTIAL/PLATELET
Basophils Absolute: 0 10*3/uL (ref 0.0–0.2)
Basos: 1 %
EOS (ABSOLUTE): 0.2 10*3/uL (ref 0.0–0.4)
Eos: 3 %
Hematocrit: 37.7 % (ref 34.0–46.6)
Hemoglobin: 11.9 g/dL (ref 11.1–15.9)
Immature Grans (Abs): 0 10*3/uL (ref 0.0–0.1)
Immature Granulocytes: 0 %
Lymphocytes Absolute: 2.8 10*3/uL (ref 0.7–3.1)
Lymphs: 45 %
MCH: 25.6 pg — ABNORMAL LOW (ref 26.6–33.0)
MCHC: 31.6 g/dL (ref 31.5–35.7)
MCV: 81 fL (ref 79–97)
Monocytes Absolute: 0.3 10*3/uL (ref 0.1–0.9)
Monocytes: 5 %
Neutrophils Absolute: 2.8 10*3/uL (ref 1.4–7.0)
Neutrophils: 46 %
Platelets: 327 10*3/uL (ref 150–450)
RBC: 4.65 x10E6/uL (ref 3.77–5.28)
RDW: 13.8 % (ref 11.7–15.4)
WBC: 6.1 10*3/uL (ref 3.4–10.8)

## 2020-03-10 LAB — TSH: TSH: 2.47 u[IU]/mL (ref 0.450–4.500)

## 2020-03-10 LAB — VITAMIN D 25 HYDROXY (VIT D DEFICIENCY, FRACTURES): Vit D, 25-Hydroxy: 9.7 ng/mL — ABNORMAL LOW (ref 30.0–100.0)

## 2020-03-10 LAB — LIPID PANEL
Chol/HDL Ratio: 3.6 ratio (ref 0.0–4.4)
Cholesterol, Total: 175 mg/dL (ref 100–199)
HDL: 48 mg/dL (ref >39)
LDL Chol Calc (NIH): 112 mg/dL — ABNORMAL HIGH (ref 0–99)
Triglycerides: 79 mg/dL (ref 0–149)
VLDL Cholesterol Cal: 15 mg/dL (ref 5–40)

## 2020-03-10 LAB — VITAMIN B12: Vitamin B-12: 514 pg/mL (ref 232–1245)

## 2020-03-11 ENCOUNTER — Encounter: Payer: Self-pay | Admitting: Family Medicine

## 2020-03-11 ENCOUNTER — Other Ambulatory Visit: Payer: Self-pay | Admitting: Family Medicine

## 2020-03-11 DIAGNOSIS — T452X5A Adverse effect of vitamins, initial encounter: Secondary | ICD-10-CM

## 2020-03-11 DIAGNOSIS — E559 Vitamin D deficiency, unspecified: Secondary | ICD-10-CM

## 2020-03-11 DIAGNOSIS — R635 Abnormal weight gain: Secondary | ICD-10-CM | POA: Insufficient documentation

## 2020-03-11 MED ORDER — VITAMIN D (ERGOCALCIFEROL) 1.25 MG (50000 UNIT) PO CAPS: 50000.0000 [IU] | ORAL_CAPSULE | ORAL | 5 refills | Status: DC

## 2020-03-11 NOTE — Progress Notes (Signed)
Patient notified of results, verbally understood. No additional questions.

## 2020-04-26 DIAGNOSIS — M722 Plantar fascial fibromatosis: Secondary | ICD-10-CM | POA: Diagnosis not present

## 2020-05-02 ENCOUNTER — Emergency Department (HOSPITAL_COMMUNITY)
Admission: EM | Admit: 2020-05-02 | Discharge: 2020-05-02 | Disposition: A | Discharge status: Home / Self Care | Payer: Medicaid Other | Attending: Emergency Medicine | Admitting: Emergency Medicine

## 2020-05-02 ENCOUNTER — Other Ambulatory Visit: Payer: Self-pay

## 2020-05-02 DIAGNOSIS — Z79899 Other long term (current) drug therapy: Secondary | ICD-10-CM | POA: Insufficient documentation

## 2020-05-02 DIAGNOSIS — Z7982 Long term (current) use of aspirin: Secondary | ICD-10-CM | POA: Diagnosis not present

## 2020-05-02 DIAGNOSIS — Z7984 Long term (current) use of oral hypoglycemic drugs: Secondary | ICD-10-CM | POA: Diagnosis not present

## 2020-05-02 DIAGNOSIS — B029 Zoster without complications: Secondary | ICD-10-CM | POA: Insufficient documentation

## 2020-05-02 DIAGNOSIS — Z8673 Personal history of transient ischemic attack (TIA), and cerebral infarction without residual deficits: Secondary | ICD-10-CM | POA: Diagnosis not present

## 2020-05-02 DIAGNOSIS — R21 Rash and other nonspecific skin eruption: Secondary | ICD-10-CM | POA: Diagnosis present

## 2020-05-02 DIAGNOSIS — I10 Essential (primary) hypertension: Secondary | ICD-10-CM | POA: Insufficient documentation

## 2020-05-02 DIAGNOSIS — E119 Type 2 diabetes mellitus without complications: Secondary | ICD-10-CM | POA: Diagnosis not present

## 2020-05-02 MED ORDER — GABAPENTIN 300 MG PO CAPS
300.0000 mg | ORAL_CAPSULE | Freq: Two times a day (BID) | ORAL | 0 refills | Status: DC
Start: 2020-05-02 — End: 2021-05-20

## 2020-05-02 MED ORDER — VALACYCLOVIR HCL 1 G PO TABS: 1000.0000 mg | ORAL_TABLET | Freq: Three times a day (TID) | ORAL | 0 refills | Status: AC

## 2020-05-02 MED ORDER — HYDROCODONE-ACETAMINOPHEN 5-325 MG PO TABS: 1.0000 | ORAL_TABLET | ORAL | 0 refills | Status: DC | PRN

## 2020-05-02 NOTE — ED Triage Notes (Signed)
Pt arrives POV from home with complaints of "rash" on the right shoulder blade. Pt reports that the area became very painful last night and she noticed a rash. Pt endorses mild itching but reports it is mostly painful. Pt denies allergies.

## 2020-05-02 NOTE — Discharge Instructions (Signed)
Take medications as prescribed.  Follow-up with primary care provider  Return for any worsening symptoms  Do not drive or operate heavy machinery while taking the Norco.  This medication may become addictive.

## 2020-05-02 NOTE — ED Provider Notes (Signed)
Autumn [name] Provider Note   CSN: [id number] Arrival date & time: 05/02/20  1821    History Chief Complaint  Patient presents with  . Rash    Autumn [name] is a 34 y.o. female with past history significant for diabetes, stroke who presents for evaluation of rash.  Patient states she noted a rash to her right scapula which began yesterday.  It has a burning sensation and is pruritic in nature.  Is not take anything for this.  Rates her pain a 5/10.  No recent lotions, perfumes.  Has any known allergies.  Has never had anything like this previously.  Denies aggravating or relieving factors.  History obtained from patient and past medical records. No interpretor was used.  HPI     Past Medical History:  Diagnosis Date  . Diabetes mellitus, type II ([place])   . Essential hypertension   . H/O varicella   . Headache   . Heart murmur   . Heel spur, right 01/2020  . Hypertension    stopped meds since pregnant  . Leaky heart valve    mild MR by echo  . Mini stroke ([place])    2018   . Obese   . Prediabetes   . Torn Achilles tendon, right, initial encounter 01/2020  . Vitamin D deficiency 02/2020    Patient Active Problem List   Diagnosis Date Noted  . Weight gain 03/11/2020  . Heel spur, unspecified laterality 02/02/2020  . Hemoglobin A1c less than 7.0% 08/13/2019  . Class 3 severe obesity due to excess calories with serious comorbidity and body mass index (BMI) of 50.0 to 59.9 in adult ([place]) 03/07/2019  . Anxiety 12/11/2018  . Peripheral edema 12/11/2018  . Type 2 diabetes mellitus without complication ([place]) 29/92/4268  . Complicated migraine 34/19/6222  . Hypertensive urgency 05/02/2017  . Normocytic anemia 05/02/2017  . Left-sided weakness 05/02/2017  . Essential hypertension   . Myofascial pain 01/13/2016  . Weakness 01/13/2016  . Pain in limb 01/13/2016  . Chest pain at rest 12/14/2015  . Preeclampsia complicating hypertension  11/29/2013  . Chronic hypertension with superimposed preeclampsia 11/28/2013  . Positive GBS test 11/28/2013  . Chronic headaches 11/28/2013  . Leaky heart valve 05/12/2012  . Morbid obesity ([place]) 01/22/2012    Past Surgical History:  Procedure Laterality Date  . BILATERAL SALPINGECTOMY Bilateral 11/28/2013   Procedure: BILATERAL SALPINGECTOMY;  Surgeon: Betsy Coder, MD;  Location: [facility];  Service: Obstetrics;  Laterality: Bilateral;  Partial on Left  . CESAREAN SECTION  05/14/2012   Procedure: CESAREAN SECTION;  Surgeon: Eldred Manges, MD;  Location: [facility];  Service: Gynecology;  Laterality: N/A;  Primary cesarean section of baby girl    at [place] 8/9  . CESAREAN SECTION N/A 11/28/2013   Procedure: CESAREAN SECTION;  Surgeon: Betsy Coder, MD;  Location: [facility];  Service: Obstetrics;  Laterality: N/A;  . CESAREAN SECTION       OB History    Gravida  2   Para  2   Term  2   Preterm  0   AB  0   Living  2     SAB  0   TAB  0   Ectopic  0   Multiple      Live Births  2           Family History  Problem Relation Age of Onset  . Hypertension Mother   . Diabetes Mellitus  II Mother   . Breast cancer Mother 62  . Heart failure Mother   . CAD Father   . Hyperlipidemia Father   . Heart disease Father   . Heart attack Father   . Hypertension Sister   . Diabetes Mellitus II Sister   . Diabetes Sister   . Thyroid disease Sister   . Anemia Sister   . Hypertension Sister   . Depression Sister   . Heart murmur Sister   . Heart murmur Daughter   . Anesthesia problems Neg Hx     Social History   Tobacco Use  . Smoking status: Never Smoker  . Smokeless tobacco: Never Used  Vaping Use  . Vaping Use: Never used  Substance Use Topics  . Alcohol use: No  . Drug use: No    Home Medications Prior to Admission medications   Medication Sig Start Date End Date Taking? Authorizing Provider  acetaminophen (TYLENOL) 500 MG tablet Take 500 mg by mouth  every 6 (six) hours as needed for mild pain or headache.    [provider]  amLODipine (NORVASC) 10 MG tablet Take 1 tablet (10 mg total) by mouth daily. 02/02/20   Azzie Glatter, FNP  aspirin EC 325 MG tablet Take 325 mg by mouth daily.    [provider]  atorvastatin (LIPITOR) 10 MG tablet Take 10 mg by mouth daily. Patient not taking: Reported on 03/09/2020 10/23/15   [provider]  benzonatate (TESSALON) 100 MG capsule Take by mouth every 8 (eight) hours.    [provider]  bisoprolol (ZEBETA) 5 MG tablet Take 1 tablet (5 mg total) by mouth daily. Patient not taking: Reported on 03/09/2020 09/02/18   Azzie Glatter, FNP  blood glucose meter kit and supplies Dispense based on patient and insurance preference. Use up to four times daily as directed. (FOR ICD-9 250.00, 250.01). 05/03/17   Domenic Polite, MD  busPIRone (BUSPAR) 10 MG tablet Take 1 tablet (10 mg total) by mouth 2 (two) times daily. Patient not taking: Reported on 03/09/2020 11/13/18   Azzie Glatter, FNP  ferrous sulfate (FERROUSUL) 325 (65 FE) MG tablet Take 1 tablet (325 mg total) by mouth daily with breakfast. Patient not taking: Reported on 03/09/2020 05/03/17   Domenic Polite, MD  furosemide (LASIX) 20 MG tablet Take 1 tablet (20 mg total) by mouth daily. 02/02/20   Azzie Glatter, FNP  gabapentin (NEURONTIN) 300 MG capsule Take 1 capsule (300 mg total) by mouth 2 (two) times daily for 10 days. 05/02/20 05/12/20  Sonali Wivell A, PA-C  hydrochlorothiazide (HYDRODIURIL) 25 MG tablet Take 1 tablet (25 mg total) by mouth daily. 02/02/20   Azzie Glatter, FNP  HYDROcodone-acetaminophen (NORCO/VICODIN) 5-325 MG tablet Take 1 tablet by mouth every 4 (four) hours as needed. 05/02/20   Ming Kunka A, PA-C  ibuprofen (ADVIL,MOTRIN) 800 MG tablet Take 1 tablet (800 mg total) by mouth every 8 (eight) hours as needed for moderate pain. Patient not taking: Reported on 03/09/2020 11/09/18   Long,  Wonda Olds, MD  losartan (COZAAR) 25 MG tablet Take 1 tablet (25 mg total) by mouth daily. 02/02/20   Azzie Glatter, FNP  meloxicam (MOBIC) 15 MG tablet Take 15 mg by mouth daily.    [provider]  metFORMIN (GLUCOPHAGE) 500 MG tablet Take 1 tablet (500 mg total) by mouth daily. 08/12/19   Azzie Glatter, FNP  TRULICITY 1.32 GM/0.1UU SOPN INJECT 0.75 MG INTO THE SKIN ONCE  A WEEK. 12/15/19   Azzie Glatter, FNP  valACYclovir (VALTREX) 1000 MG tablet Take 1 tablet (1,000 mg total) by mouth 3 (three) times daily for 14 days. 05/02/20 05/16/20  Avis Mcmahill A, PA-C  Vitamin D, Ergocalciferol, (DRISDOL) 1.25 MG (50000 UNIT) CAPS capsule Take 1 capsule (50,000 Units total) by mouth every 7 (seven) days. 03/11/20   Azzie Glatter, FNP    Allergies    Patient has no known allergies.  Review of Systems   Review of Systems  Constitutional: Negative.   HENT: Negative.   Respiratory: Negative.   Cardiovascular: Negative.   Gastrointestinal: Negative.   Genitourinary: Negative.   Musculoskeletal: Negative.   Skin: Positive for rash.  Neurological: Negative.   All other systems reviewed and are negative.   Physical Exam Updated Vital Signs BP (!) 168/100 (BP Location: Right Wrist)   Pulse 90   Temp 98.2 F (36.8 C)   Resp 20   SpO2 100%   Physical Exam Vitals and nursing note reviewed.  Constitutional:      General: She is not in acute distress.    Appearance: She is well-developed. She is not ill-appearing, toxic-appearing or diaphoretic.  HENT:     Head: Normocephalic and atraumatic.     Jaw: There is normal jaw occlusion.     Comments: No vesicular lesions    Ears:     Comments: No vesicular lesions    Nose: Nose normal.     Mouth/Throat:     Lips: Pink.     Mouth: Mucous membranes are moist.     Pharynx: Oropharynx is clear. Uvula midline.     Comments: No intraoral lesions. Eyes:     Pupils: Pupils are equal, round, and reactive to light.    Cardiovascular:     Rate and Rhythm: Normal rate.     Pulses: Normal pulses.     Heart sounds: Normal heart sounds.  Pulmonary:     Effort: Pulmonary effort is normal. No respiratory distress.     Breath sounds: Normal breath sounds.  Abdominal:     General: Bowel sounds are normal. There is no distension.  Musculoskeletal:        General: No swelling, tenderness, deformity or signs of injury. Normal range of motion.     Cervical back: Normal range of motion.     Right lower leg: No edema.     Left lower leg: No edema.  Skin:    General: Skin is warm and dry.     Capillary Refill: Capillary refill takes less than 2 seconds.     Comments: Vesicular rash in dermatomal position to right scapula.  No fluctuance, induration.  No bulla, target lesions, desquamated skin.  See picture in chart  Neurological:     General: No focal deficit present.     Mental Status: She is alert and oriented to person, place, and time.       ED Results / Procedures / Treatments   Labs (all labs ordered are listed, but only abnormal results are displayed) Labs Reviewed - No data to display  EKG None  Radiology No results found.  Procedures Procedures (including critical care time)  Medications Ordered in ED Medications - No data to display  ED Course  I have reviewed the triage vital signs and the nursing notes.  Pertinent labs & imaging results that were available during my care of the patient were reviewed by me and considered in my medical decision making (see chart  for details).  34 year old who presents for evaluation of rash.  Began yesterday.  She is afebrile, nonseptic, non-ill-appearing.    Rash is tender with grouped clear vesicles on an erythematous base located along dermatome T1-T2 unilaterally on the right. Pt without signs of CNS involvement and there is no involvement of the face or eyes; no concern for opthalmic zoster.  Will discharge home with pain management and  acyclovir.  Also recommend calamine lotion and cool compresses as needed for pain control.  Patient denies any difficulty breathing or swallowing.  Pt has a patent airway without stridor and is handling secretions without difficulty; no angioedema. No blisters, no pustules, no warmth, no draining sinus tracts, no superficial abscesses, no bullous impetigo, no desquamation, no target lesions with dusky purpura or a central bulla. Not tender to touch. No concern for superimposed infection. No concern for SJS, TEN, TSS, tick borne illness, syphilis or other life-threatening condition.  The patient has been appropriately medically screened and/or stabilized in the ED. I have low suspicion for any other emergent medical condition which would require further screening, evaluation or treatment in the ED or require inpatient management.  Patient is hemodynamically stable and in no acute distress.  Patient able to ambulate in department prior to ED.  Evaluation does not show acute pathology that would require ongoing or additional emergent interventions while in the emergency department or further inpatient treatment.  I have discussed the diagnosis with the patient and answered all questions.  Pain is been managed while in the emergency department and patient has no further complaints prior to discharge.  Patient is comfortable with plan discussed in room and is stable for discharge at this time.  I have discussed strict return precautions for returning to the emergency department.  Patient was encouraged to follow-up with PCP/specialist refer to at discharge.     MDM Rules/Calculators/A&P                           Final Clinical Impression(s) / ED Diagnoses Final diagnoses:  Herpes zoster without complication    Rx / DC Orders ED Discharge Orders         Ordered    gabapentin (NEURONTIN) 300 MG capsule  2 times daily     Discontinue  Reprint     05/02/20 1939    HYDROcodone-acetaminophen  (NORCO/VICODIN) 5-325 MG tablet  Every 4 hours PRN     Discontinue  Reprint     05/02/20 1939    valACYclovir (VALTREX) 1000 MG tablet  3 times daily     Discontinue  Reprint     05/02/20 1939           Leonna Schlee A, PA-C 05/02/20 2206    Varney Biles, MD 05/03/20 1448

## 2020-05-03 ENCOUNTER — Telehealth: Payer: Self-pay | Admitting: *Deleted

## 2020-05-03 NOTE — Telephone Encounter (Signed)
Medicaid Managed Care team Transition of Care Assessment outreach attempt #1 made today. Unable to reach patient. HIPPA compliant voice message left requesting a return call. The patient has also been enrolled in an automated discharge follow up call series and will receive two outreach attempts for transition of care assessment. Contact information has been left for the patient and the Medicaid Managed Care team is available to provide assistance to the patient at any time.  ° °Katrice Kalee Broxton, RN, BSN, CCRN °Patient Engagement Center °[phone] ° °

## 2020-05-04 ENCOUNTER — Telehealth: Payer: Self-pay | Admitting: *Deleted

## 2020-05-04 NOTE — Telephone Encounter (Signed)
Contacted pt to complete transition of care assessment:  Transition Care Management Follow-up Telephone Call  . Medicaid Managed Care Transition Call Status:MM [facility] Call Made  . Date of discharge and from where:[facility], 05/02/20  . How have you been since you were released from the hospital? "ok"  . Any questions or concerns? No  Items Reviewed: Marland Kitchen Did the pt receive and understand the discharge instructions provided? Yes  . Medications obtained and verified? Yes  . Any new allergies since your discharge? No  . Dietary orders reviewed? No  . Do you have support at home? Yes, family  Functional Questionnaire: (I = Independent and D = Dependent)  ADLs: Independent Bathing/Dressing:Independent Meal Prep: Independent Eating: Independent Maintaining continence: Independent Transferring/Ambulation: Independent Managing Meds: Independent   Follow up appointments reviewed:   PCP Hospital f/u appt confirmed? No  S Pt states she called Dr Keturah Barre office, and she is waiting for them to call her back to schedule appt  Specialist Hospital f/u appt confirmed?  n/a Are transportation arrangements needed? No   If their condition worsens, is the pt aware to call PCP or go to the EmergencyDept.? No  Was the patient provided with contact information for the PCP's office or ED? Yes  Was to pt encouraged to call back with questions or concerns? Yes  Burnard Bunting, RN, BSN, CCRN Patient Engagement Center [phone]

## 2020-05-24 DIAGNOSIS — M722 Plantar fascial fibromatosis: Secondary | ICD-10-CM | POA: Diagnosis not present

## 2020-06-07 ENCOUNTER — Ambulatory Visit (INDEPENDENT_AMBULATORY_CARE_PROVIDER_SITE_OTHER): Payer: Medicaid Other | Admitting: Family Medicine

## 2020-06-07 ENCOUNTER — Encounter: Payer: Self-pay | Admitting: Family Medicine

## 2020-06-07 ENCOUNTER — Other Ambulatory Visit: Payer: Self-pay

## 2020-06-07 VITALS — BP 149/80 | HR 102 | Temp 98.7°F | Resp 18 | Ht 66.0 in | Wt 342.0 lb

## 2020-06-07 DIAGNOSIS — Z6841 Body Mass Index (BMI) 40.0 and over, adult: Secondary | ICD-10-CM

## 2020-06-07 DIAGNOSIS — R739 Hyperglycemia, unspecified: Secondary | ICD-10-CM | POA: Diagnosis not present

## 2020-06-07 DIAGNOSIS — I10 Essential (primary) hypertension: Secondary | ICD-10-CM

## 2020-06-07 DIAGNOSIS — Z09 Encounter for follow-up examination after completed treatment for conditions other than malignant neoplasm: Secondary | ICD-10-CM | POA: Diagnosis not present

## 2020-06-07 DIAGNOSIS — F419 Anxiety disorder, unspecified: Secondary | ICD-10-CM | POA: Diagnosis not present

## 2020-06-07 DIAGNOSIS — Z202 Contact with and (suspected) exposure to infections with a predominantly sexual mode of transmission: Secondary | ICD-10-CM

## 2020-06-07 DIAGNOSIS — R7309 Other abnormal glucose: Secondary | ICD-10-CM | POA: Diagnosis not present

## 2020-06-07 DIAGNOSIS — E119 Type 2 diabetes mellitus without complications: Secondary | ICD-10-CM | POA: Diagnosis not present

## 2020-06-07 DIAGNOSIS — E66813 Obesity, class 3: Secondary | ICD-10-CM

## 2020-06-07 LAB — POCT URINALYSIS DIPSTICK
Bilirubin, UA: NEGATIVE
Blood, UA: NEGATIVE
Glucose, UA: NEGATIVE
Ketones, UA: NEGATIVE
Leukocytes, UA: NEGATIVE
Nitrite, UA: NEGATIVE
Protein, UA: POSITIVE — AB
Spec Grav, UA: >=1.03 — AB (ref 1.010–1.025)
Urobilinogen, UA: 0.2 E.U./dL
pH, UA: 5.5 (ref 5.0–8.0)

## 2020-06-07 LAB — POCT GLYCOSYLATED HEMOGLOBIN (HGB A1C)
HbA1c POC (<> result, manual entry): 7 % (ref 4.0–5.6)
HbA1c, POC (controlled diabetic range): 7 % (ref 0.0–7.0)
HbA1c, POC (prediabetic range): 7 % — AB (ref 5.7–6.4)
Hemoglobin A1C: 7 % — AB (ref 4.0–5.6)

## 2020-06-07 LAB — GLUCOSE, POCT (MANUAL RESULT ENTRY): POC Glucose: 184 mg/dl — AB (ref 70–99)

## 2020-06-07 MED ORDER — METFORMIN HCL ER 750 MG PO TB24: 750.0000 mg | ORAL_TABLET | Freq: Every day | ORAL | 11 refills | Status: DC

## 2020-06-07 MED ORDER — TRULICITY 0.75 MG/0.5ML ~~LOC~~ SOAJ: 0.7500 mg | SUBCUTANEOUS | 11 refills | Status: DC

## 2020-06-07 MED ORDER — ALPRAZOLAM 0.5 MG PO TABS: 0.5000 mg | ORAL_TABLET | Freq: Every evening | ORAL | 0 refills | Status: DC | PRN

## 2020-06-07 NOTE — Progress Notes (Signed)
Patient [facility]    Established Patient Office Visit  Subjective:  Patient ID: Autumn [name], female    DOB: 04/28/1986  Age: 34 y.o. MRN: [id number]  CC:  Chief Complaint  Patient presents with  . Follow-up    Pt states she will kike to discuss testd for STD all. Pt states x3wks she saw blood in her unine.    HPI Autumn [name] is a 34 year old female who presents for Follow Up today.   Patient Active Problem List   Diagnosis Date Noted  . Weight gain 03/11/2020  . Heel spur, unspecified laterality 02/02/2020  . Hemoglobin A1c less than 7.0% 08/13/2019  . Class 3 severe obesity due to excess calories with serious comorbidity and body mass index (BMI) of 50.0 to 59.9 in adult ([place]) 03/07/2019  . Anxiety 12/11/2018  . Peripheral edema 12/11/2018  . Type 2 diabetes mellitus without complication ([place]) 33/29/5188  . Complicated migraine 41/66/0630  . Hypertensive urgency 05/02/2017  . Normocytic anemia 05/02/2017  . Left-sided weakness 05/02/2017  . Essential hypertension   . Myofascial pain 01/13/2016  . Weakness 01/13/2016  . Pain in limb 01/13/2016  . Chest pain at rest 12/14/2015  . Preeclampsia complicating hypertension 11/29/2013  . Chronic hypertension with superimposed preeclampsia 11/28/2013  . Positive GBS test 11/28/2013  . Chronic headaches 11/28/2013  . Leaky heart valve 05/12/2012  . Morbid obesity ([place]) 01/22/2012    Current Status: Since her last office visit, she is anxious to be tested for STDs. She states that her husband is currently having an affair with another woman. Her anxiety is increased today. She denies suicidal ideations, homicidal ideations, or auditory hallucinations. She denies fevers, chills, fatigue, recent infections, weight loss, and night sweats. She has not had any headaches, visual changes, dizziness, and falls. No chest pain, heart palpitations, cough and shortness of breath  reported. Denies GI problems such as nausea, vomiting, diarrhea, and constipation. She has no reports of blood in stools, dysuria and hematuria. She is taking all medications as prescribed. She denies pain today.    Past Medical History:  Diagnosis Date  . Diabetes mellitus, type II ([place])   . Essential hypertension   . H/O varicella   . Headache   . Heart murmur   . Heel spur, right 01/2020  . Hypertension    stopped meds since pregnant  . Leaky heart valve    mild MR by echo  . Mini stroke ([place])    2018   . Obese   . Prediabetes   . Torn Achilles tendon, right, initial encounter 01/2020  . Vitamin D deficiency 02/2020    Past Surgical History:  Procedure Laterality Date  . BILATERAL SALPINGECTOMY Bilateral 11/28/2013   Procedure: BILATERAL SALPINGECTOMY;  Surgeon: Betsy Coder, MD;  Location: [facility];  Service: Obstetrics;  Laterality: Bilateral;  Partial on Left  . CESAREAN SECTION  05/14/2012   Procedure: CESAREAN SECTION;  Surgeon: Eldred Manges, MD;  Location: [facility];  Service: Gynecology;  Laterality: N/A;  Primary cesarean section of baby girl    at [place] 8/9  . CESAREAN SECTION N/A 11/28/2013   Procedure: CESAREAN SECTION;  Surgeon: Betsy Coder, MD;  Location: [facility];  Service: Obstetrics;  Laterality: N/A;  . CESAREAN SECTION      Family History  Problem Relation Age of Onset  . Hypertension Mother   . Diabetes Mellitus II Mother   . Breast  cancer Mother 77  . Heart failure Mother   . CAD Father   . Hyperlipidemia Father   . Heart disease Father   . Heart attack Father   . Hypertension Sister   . Diabetes Mellitus II Sister   . Diabetes Sister   . Thyroid disease Sister   . Anemia Sister   . Hypertension Sister   . Depression Sister   . Heart murmur Sister   . Heart murmur Daughter   . Anesthesia problems Neg Hx     Social History   Socioeconomic History  . Marital status: Married    Spouse name: Not on file  . Number of children: 2  .  Years of education: Not on file  . Highest education level: Not on file  Occupational History  . Not on file  Tobacco Use  . Smoking status: Never Smoker  . Smokeless tobacco: Never Used  Vaping Use  . Vaping Use: Never used  Substance and Sexual Activity  . Alcohol use: No  . Drug use: No  . Sexual activity: Yes    Birth control/protection: None  Other Topics Concern  . Not on file  Social History Narrative   ** Merged History Encounter **       Lives with husband and 2 children in a one story home.   Works as an Building control surveyor.   Education: college degree.   Social Determinants of Health   Financial Resource Strain:   . Difficulty of Paying Living Expenses: Not on file  Food Insecurity:   . Worried About Charity fundraiser in the Last Year: Not on file  . Ran Out of Food in the Last Year: Not on file  Transportation Needs:   . Lack of Transportation (Medical): Not on file  . Lack of Transportation (Non-Medical): Not on file  Physical Activity:   . Days of Exercise per Week: Not on file  . Minutes of Exercise per Session: Not on file  Stress:   . Feeling of Stress : Not on file  Social Connections:   . Frequency of Communication with Friends and Family: Not on file  . Frequency of Social Gatherings with Friends and Family: Not on file  . Attends Religious Services: Not on file  . Active Member of Clubs or Organizations: Not on file  . Attends Archivist Meetings: Not on file  . Marital Status: Not on file  Intimate Partner Violence:   . Fear of Current or Ex-Partner: Not on file  . Emotionally Abused: Not on file  . Physically Abused: Not on file  . Sexually Abused: Not on file    Outpatient Medications Prior to Visit  Medication Sig Dispense Refill  . acetaminophen (TYLENOL) 500 MG tablet Take 500 mg by mouth every 6 (six) hours as needed for mild pain or headache.    Marland Kitchen amLODipine (NORVASC) 10 MG tablet Take 1 tablet (10 mg total) by mouth daily.  30 tablet 11  . aspirin EC 325 MG tablet Take 325 mg by mouth daily.    Marland Kitchen atorvastatin (LIPITOR) 10 MG tablet Take 10 mg by mouth daily.   1  . bisoprolol (ZEBETA) 5 MG tablet Take 1 tablet (5 mg total) by mouth daily. 30 tablet 2  . blood glucose meter kit and supplies Dispense based on patient and insurance preference. Use up to four times daily as directed. (FOR ICD-9 250.00, 250.01). 1 each 0  . busPIRone (BUSPAR) 10 MG tablet Take 1  tablet (10 mg total) by mouth 2 (two) times daily. 60 tablet 3  . furosemide (LASIX) 20 MG tablet Take 1 tablet (20 mg total) by mouth daily. 30 tablet 11  . hydrochlorothiazide (HYDRODIURIL) 25 MG tablet Take 1 tablet (25 mg total) by mouth daily. 30 tablet 11  . ibuprofen (ADVIL,MOTRIN) 800 MG tablet Take 1 tablet (800 mg total) by mouth every 8 (eight) hours as needed for moderate pain. 21 tablet 0  . losartan (COZAAR) 25 MG tablet Take 1 tablet (25 mg total) by mouth daily. 30 tablet 11  . meloxicam (MOBIC) 15 MG tablet Take 15 mg by mouth daily.    . metFORMIN (GLUCOPHAGE) 500 MG tablet Take 1 tablet (500 mg total) by mouth daily. 30 tablet 3  . ferrous sulfate (FERROUSUL) 325 (65 FE) MG tablet Take 1 tablet (325 mg total) by mouth daily with breakfast. (Patient not taking: Reported on 03/09/2020) 30 tablet 0  . gabapentin (NEURONTIN) 300 MG capsule Take 1 capsule (300 mg total) by mouth 2 (two) times daily for 10 days. 20 capsule 0  . HYDROcodone-acetaminophen (NORCO/VICODIN) 5-325 MG tablet Take 1 tablet by mouth every 4 (four) hours as needed. (Patient not taking: Reported on 06/07/2020) 10 tablet 0  . Vitamin D, Ergocalciferol, (DRISDOL) 1.25 MG (50000 UNIT) CAPS capsule Take 1 capsule (50,000 Units total) by mouth every 7 (seven) days. (Patient not taking: Reported on 06/07/2020) 5 capsule 5  . benzonatate (TESSALON) 100 MG capsule Take by mouth every 8 (eight) hours. (Patient not taking: Reported on 06/07/2020)    . TRULICITY 2.48 GN/0.0BB SOPN INJECT 0.75 MG  INTO THE SKIN ONCE A WEEK. (Patient not taking: Reported on 06/07/2020) 2 pen 3   No facility-administered medications prior to visit.    No Known Allergies  ROS Review of Systems  Constitutional: Negative.   HENT: Negative.   Eyes: Negative.   Respiratory: Negative.   Cardiovascular: Negative.   Gastrointestinal: Positive for abdominal distention (obese).  Endocrine: Negative.   Genitourinary: Negative.   Musculoskeletal: Negative.   Skin: Negative.   Allergic/Immunologic: Negative.   Neurological: Positive for dizziness (occasional ) and headaches (occasional ).  Hematological: Negative.   Psychiatric/Behavioral: The patient is nervous/anxious.        Anxiety/depression      Objective:    Physical Exam Vitals and nursing note reviewed.  Constitutional:      Appearance: Normal appearance. She is obese.  HENT:     Head: Normocephalic and atraumatic.     Nose: Nose normal.     Mouth/Throat:     Mouth: Mucous membranes are moist.     Pharynx: Oropharynx is clear.  Cardiovascular:     Rate and Rhythm: Normal rate and regular rhythm.     Pulses: Normal pulses.     Heart sounds: Normal heart sounds.  Pulmonary:     Effort: Pulmonary effort is normal.     Breath sounds: Normal breath sounds.  Abdominal:     General: Abdomen is flat. Bowel sounds are normal. There is distension (obese).     Palpations: Abdomen is soft.  Musculoskeletal:        General: Normal range of motion.     Cervical back: Normal range of motion and neck supple.  Skin:    General: Skin is warm and dry.  Neurological:     General: No focal deficit present.     Mental Status: She is alert and oriented to person, place, and time.  Psychiatric:  Thought Content: Thought content normal.        Judgment: Judgment normal.     Comments: tearful    BP (!) 149/80 (BP Location: Left Arm, Patient Position: Sitting, Cuff Size: Large)   Pulse (!) 102   Temp 98.7 F (37.1 C)   Resp 18   Ht 5'  6" (1.676 m)   Wt (!) 342 lb (155.1 kg)   LMP 05/12/2020 (Approximate)   SpO2 99%   BMI 55.20 kg/m  Wt Readings from Last 3 Encounters:  06/07/20 (!) 342 lb (155.1 kg)  03/09/20 (!) 342 lb 0.2 oz (155.1 kg)  08/12/19 (!) 343 lb 9.6 oz (155.9 kg)     Health Maintenance Due  Topic Date Due  . PNEUMOCOCCAL POLYSACCHARIDE VACCINE AGE 75-64 HIGH RISK  Never done  . FOOT EXAM  Never done  . OPHTHALMOLOGY EXAM  Never done  . COVID-19 Vaccine (1) Never done  . PAP SMEAR-Modifier  06/04/2008  . INFLUENZA VACCINE  04/25/2020    There are no preventive care reminders to display for this patient.  Lab Results  Component Value Date   TSH 2.470 03/09/2020   Lab Results  Component Value Date   WBC 6.1 03/09/2020   HGB 11.9 03/09/2020   HCT 37.7 03/09/2020   MCV 81 03/09/2020   PLT 327 03/09/2020   Lab Results  Component Value Date   NA 141 08/28/2017   K 4.5 08/28/2017   CO2 22 08/28/2017   GLUCOSE 99 08/28/2017   BUN 8 08/28/2017   CREATININE 0.67 08/28/2017   BILITOT 0.4 05/01/2017   ALKPHOS 91 05/01/2017   AST 22 05/01/2017   ALT 20 05/01/2017   PROT 8.1 05/01/2017   ALBUMIN 3.2 (L) 05/01/2017   CALCIUM 9.2 08/28/2017   ANIONGAP 7 05/03/2017   Lab Results  Component Value Date   CHOL 175 03/09/2020   Lab Results  Component Value Date   HDL 48 03/09/2020   Lab Results  Component Value Date   LDLCALC 112 (H) 03/09/2020   Lab Results  Component Value Date   TRIG 79 03/09/2020   Lab Results  Component Value Date   CHOLHDL 3.6 03/09/2020   Lab Results  Component Value Date   HGBA1C 7.0 (A) 06/07/2020   HGBA1C 7.0 06/07/2020   HGBA1C 7.0 (A) 06/07/2020   HGBA1C 7.0 06/07/2020      Assessment & Plan:   1. Possible exposure to STD - HepB+HepC+HIV Panel - NuSwab Vaginitis Plus (VG+)  2. Anxiety We will initiate trial of Alprazolam today.  - ALPRAZolam (XANAX) 0.5 MG tablet; Take 1 tablet (0.5 mg total) by mouth at bedtime as needed.  Dispense: 30  tablet; Refill: 0  3. Type 2 diabetes mellitus without complication, without long-term current use of insulin (HCC) She will continue medication as prescribed, to decrease foods/beverages high in sugars and carbs and follow Heart Healthy or DASH diet. Increase physical activity to at least 30 minutes cardio exercise daily. - Urinalysis Dipstick - POC Glucose (CBG) - POC HgB A1c - CBC with Differential - metFORMIN (GLUCOPHAGE XR) 750 MG 24 hr tablet; Take 1 tablet (750 mg total) by mouth daily with breakfast.  Dispense: 30 tablet; Refill: 11 - Dulaglutide (TRULICITY) 6.81 EX/5.1ZG SOPN; Inject 0.75 mg into the skin once a week.  Dispense: 0.5 mL; Refill: 11  4. Hemoglobin A1c less than 7.0% Stable at 7.0 today.   5. Hyperglycemia  6. Class 3 severe obesity due to excess calories with serious  comorbidity and body mass index (BMI) of 50.0 to 59.9 in adult [facility]) Body mass index is 55.2 kg/m. Goal BMI  is <30. Encouraged efforts to reduce weight include engaging in physical activity as tolerated with goal of 150 minutes per week. Improve dietary choices and eat a meal regimen consistent with a Mediterranean or DASH diet. Reduce simple carbohydrates. Do not skip meals and eat healthy snacks throughout the day to avoid over-eating at dinner. Set a goal weight loss that is achievable for you.  7. Essential hypertension The current medical regimen is effective; blood pressure is stable at 149/80 today; continue present plan and medications as prescribed. She will continue to take medications as prescribed, to decrease high sodium intake, excessive alcohol intake, increase potassium intake, smoking cessation, and increase physical activity of at least 30 minutes of cardio activity daily. She will continue to follow Heart Healthy or DASH diet.   8. Follow up She will follow up in 1 month to assess DM, HTN, and Anxiety.    Meds ordered this encounter  Medications  . ALPRAZolam (XANAX) 0.5 MG tablet      Sig: Take 1 tablet (0.5 mg total) by mouth at bedtime as needed.    Dispense:  30 tablet    Refill:  0    Order Specific Question:   Supervising Provider    Answer:   Tresa Garter [id number]  . metFORMIN (GLUCOPHAGE XR) 750 MG 24 hr tablet    Sig: Take 1 tablet (750 mg total) by mouth daily with breakfast.    Dispense:  30 tablet    Refill:  11  . Dulaglutide (TRULICITY) 7.79 TJ/0.3ES SOPN    Sig: Inject 0.75 mg into the skin once a week.    Dispense:  0.5 mL    Refill:  11    Orders Placed This Encounter  Procedures  . HepB+HepC+HIV Panel  . NuSwab Vaginitis Plus (VG+)  . CBC with Differential  . Urinalysis Dipstick  . POC Glucose (CBG)  . POC HgB A1c    Referral Orders  No referral(s) requested today     Kathe Becton,  MSN, FNP-BC [facility] [address] [place], [place] [address] [phone] fax  Problem List Items Addressed This Visit      Cardiovascular and Mediastinum   Essential hypertension     Endocrine   Type 2 diabetes mellitus without complication ([place]) - Primary   Relevant Medications   metFORMIN (GLUCOPHAGE XR) 750 MG 24 hr tablet   Dulaglutide (TRULICITY) 4.56 YB/6.3SL SOPN   Other Relevant Orders   Urinalysis Dipstick (Completed)   POC Glucose (CBG) (Completed)   POC HgB A1c (Completed)   CBC with Differential     Other   Anxiety   Relevant Medications   ALPRAZolam (XANAX) 0.5 MG tablet   Class 3 severe obesity due to excess calories with serious comorbidity and body mass index (BMI) of 50.0 to 59.9 in adult [facility])   Relevant Medications   metFORMIN (GLUCOPHAGE XR) 750 MG 24 hr tablet   Dulaglutide (TRULICITY) 3.73 SK/8.7GO SOPN   Hemoglobin A1c less than 7.0%    Other Visit Diagnoses    Possible exposure to STD       Relevant Orders   HepB+HepC+HIV Panel   NuSwab Vaginitis Plus (VG+)   Hyperglycemia       Follow up           Meds ordered  this encounter  Medications  . ALPRAZolam (XANAX) 0.5 MG tablet    Sig: Take 1 tablet (0.5 mg total) by mouth at bedtime as needed.    Dispense:  30 tablet    Refill:  0    Order Specific Question:   Supervising Provider    Answer:   Tresa Garter [id number]  . metFORMIN (GLUCOPHAGE XR) 750 MG 24 hr tablet    Sig: Take 1 tablet (750 mg total) by mouth daily with breakfast.    Dispense:  30 tablet    Refill:  11  . Dulaglutide (TRULICITY) 8.41 SY/5.7HG SOPN    Sig: Inject 0.75 mg into the skin once a week.    Dispense:  0.5 mL    Refill:  11    Follow-up: Return in about 1 month (around 07/07/2020).    Azzie Glatter, FNP

## 2020-06-08 LAB — CBC WITH DIFFERENTIAL/PLATELET
Basophils Absolute: 0 10*3/uL (ref 0.0–0.2)
Basos: 1 %
EOS (ABSOLUTE): 0.2 10*3/uL (ref 0.0–0.4)
Eos: 2 %
Hematocrit: 37 % (ref 34.0–46.6)
Hemoglobin: 11.7 g/dL (ref 11.1–15.9)
Immature Grans (Abs): 0 10*3/uL (ref 0.0–0.1)
Immature Granulocytes: 0 %
Lymphocytes Absolute: 2.9 10*3/uL (ref 0.7–3.1)
Lymphs: 34 %
MCH: 25.8 pg — ABNORMAL LOW (ref 26.6–33.0)
MCHC: 31.6 g/dL (ref 31.5–35.7)
MCV: 82 fL (ref 79–97)
Monocytes Absolute: 0.4 10*3/uL (ref 0.1–0.9)
Monocytes: 5 %
Neutrophils Absolute: 5 10*3/uL (ref 1.4–7.0)
Neutrophils: 58 %
Platelets: 280 10*3/uL (ref 150–450)
RBC: 4.54 x10E6/uL (ref 3.77–5.28)
RDW: 13.7 % (ref 11.7–15.4)
WBC: 8.5 10*3/uL (ref 3.4–10.8)

## 2020-06-08 LAB — HEPB+HEPC+HIV PANEL
HIV Screen 4th Generation wRfx: NONREACTIVE
Hep B C IgM: NEGATIVE
Hep B Core Total Ab: NEGATIVE
Hep B E Ab: NEGATIVE
Hep B E Ag: NEGATIVE
Hep B Surface Ab, Qual: NONREACTIVE
Hep C Virus Ab: <0.1 s/co ratio (ref 0.0–0.9)
Hepatitis B Surface Ag: NEGATIVE

## 2020-06-09 ENCOUNTER — Encounter: Payer: Self-pay | Admitting: Family Medicine

## 2020-06-09 ENCOUNTER — Ambulatory Visit: Payer: Self-pay | Admitting: Family Medicine

## 2020-06-10 NOTE — Progress Notes (Signed)
Pt was called @ 8:55am to discuss her lab results. Pt did not answer the phone so a message was left for her to give [place] a call back.

## 2020-06-11 ENCOUNTER — Other Ambulatory Visit: Payer: Self-pay | Admitting: Family Medicine

## 2020-06-11 DIAGNOSIS — B9689 Other specified bacterial agents as the cause of diseases classified elsewhere: Secondary | ICD-10-CM

## 2020-06-11 DIAGNOSIS — N760 Acute vaginitis: Secondary | ICD-10-CM

## 2020-06-11 DIAGNOSIS — B379 Candidiasis, unspecified: Secondary | ICD-10-CM

## 2020-06-11 LAB — NUSWAB VAGINITIS PLUS (VG+)
Atopobium vaginae: HIGH Score — AB
BVAB 2: HIGH Score — AB
Candida albicans, NAA: NEGATIVE
Candida glabrata, NAA: POSITIVE — AB
Chlamydia trachomatis, NAA: NEGATIVE
Neisseria gonorrhoeae, NAA: NEGATIVE
Trich vag by NAA: NEGATIVE

## 2020-06-11 MED ORDER — METRONIDAZOLE 500 MG PO TABS: 500.0000 mg | ORAL_TABLET | Freq: Two times a day (BID) | ORAL | 0 refills | Status: AC

## 2020-06-11 MED ORDER — FLUCONAZOLE 150 MG PO TABS: 150.0000 mg | ORAL_TABLET | Freq: Once | ORAL | 0 refills | Status: AC

## 2020-06-11 NOTE — Progress Notes (Signed)
m °

## 2020-07-07 ENCOUNTER — Other Ambulatory Visit: Payer: Self-pay

## 2020-07-07 ENCOUNTER — Ambulatory Visit (INDEPENDENT_AMBULATORY_CARE_PROVIDER_SITE_OTHER): Payer: Medicaid Other | Admitting: Family Medicine

## 2020-07-07 VITALS — BP 137/81 | HR 70 | Temp 97.7°F | Ht 66.0 in | Wt 338.0 lb

## 2020-07-07 DIAGNOSIS — R7309 Other abnormal glucose: Secondary | ICD-10-CM

## 2020-07-07 DIAGNOSIS — I10 Essential (primary) hypertension: Secondary | ICD-10-CM | POA: Diagnosis not present

## 2020-07-07 DIAGNOSIS — Z6841 Body Mass Index (BMI) 40.0 and over, adult: Secondary | ICD-10-CM | POA: Diagnosis not present

## 2020-07-07 DIAGNOSIS — E119 Type 2 diabetes mellitus without complications: Secondary | ICD-10-CM

## 2020-07-07 DIAGNOSIS — Z09 Encounter for follow-up examination after completed treatment for conditions other than malignant neoplasm: Secondary | ICD-10-CM

## 2020-07-07 DIAGNOSIS — R21 Rash and other nonspecific skin eruption: Secondary | ICD-10-CM | POA: Diagnosis not present

## 2020-07-07 DIAGNOSIS — R634 Abnormal weight loss: Secondary | ICD-10-CM

## 2020-07-07 DIAGNOSIS — G43919 Migraine, unspecified, intractable, without status migrainosus: Secondary | ICD-10-CM | POA: Diagnosis not present

## 2020-07-07 DIAGNOSIS — F419 Anxiety disorder, unspecified: Secondary | ICD-10-CM | POA: Diagnosis not present

## 2020-07-07 DIAGNOSIS — L299 Pruritus, unspecified: Secondary | ICD-10-CM

## 2020-07-07 DIAGNOSIS — R739 Hyperglycemia, unspecified: Secondary | ICD-10-CM | POA: Diagnosis not present

## 2020-07-07 DIAGNOSIS — E66813 Obesity, class 3: Secondary | ICD-10-CM

## 2020-07-07 MED ORDER — HYDROCORTISONE 1 % EX CREA: 1.0000 "application " | TOPICAL_CREAM | Freq: Two times a day (BID) | CUTANEOUS | 3 refills | Status: DC

## 2020-07-07 MED ORDER — METFORMIN HCL ER 750 MG PO TB24: 750.0000 mg | ORAL_TABLET | Freq: Two times a day (BID) | ORAL | 1 refills | Status: DC | PRN

## 2020-07-07 MED ORDER — METFORMIN HCL ER 500 MG PO TB24: 500.0000 mg | ORAL_TABLET | Freq: Two times a day (BID) | ORAL | 1 refills | Status: DC

## 2020-07-07 NOTE — Progress Notes (Signed)
[facility]  Established Patient Office Visit  Subjective:  Patient ID: Autumn [name], female    DOB: Nov 05, 1985  Age: 34 y.o. MRN: [id number]  CC: No chief complaint on file.   HPI Autumn [name] is a 34 year old female who presents for Follow Up today.    Patient Active Problem List   Diagnosis Date Noted  . Weight gain 03/11/2020  . Heel spur, unspecified laterality 02/02/2020  . Hemoglobin A1c less than 7.0% 08/13/2019  . Class 3 severe obesity due to excess calories with serious comorbidity and body mass index (BMI) of 50.0 to 59.9 in adult ([place]) 03/07/2019  . Anxiety 12/11/2018  . Peripheral edema 12/11/2018  . Type 2 diabetes mellitus without complication ([place]) 72/03/2181  . Complicated migraine 88/33/7445  . Hypertensive urgency 05/02/2017  . Normocytic anemia 05/02/2017  . Left-sided weakness 05/02/2017  . Essential hypertension   . Myofascial pain 01/13/2016  . Weakness 01/13/2016  . Pain in limb 01/13/2016  . Chest pain at rest 12/14/2015  . Preeclampsia complicating hypertension 11/29/2013  . Chronic hypertension with superimposed preeclampsia 11/28/2013  . Positive GBS test 11/28/2013  . Chronic headaches 11/28/2013  . Leaky heart valve 05/12/2012  . Morbid obesity ([place]) 01/22/2012    Current Status: Since her last office visit, she is doing well with no complaints. She has increasing her activity. She denies fatigue, frequent urination, blurred vision, excessive hunger, excessive thirst, weight gain, weight loss, and poor wound healing. She continues to check her feet regularly. She denies  chest pain, cough, shortness of breath, heart palpitations, and falls. She has occasional headaches and dizziness with position changes. Denies severe headaches, confusion, seizures, double vision, and blurred vision, nausea and vomiting. He denies fevers, chills, fatigue, recent infections, weight loss, and  night sweats. Denies GI problems such as diarrhea, and constipation. She has no reports of blood in stools, dysuria and hematuria. No depression or anxiety reported today. She is taking all medications as prescribed. She denies pain today.   Past Medical History:  Diagnosis Date  . Diabetes mellitus, type II ([place])   . Essential hypertension   . H/O varicella   . Headache   . Heart murmur   . Heel spur, right 01/2020  . Hypertension    stopped meds since pregnant  . Leaky heart valve    mild MR by echo  . Mini stroke ([place])    2018   . Obese   . Prediabetes   . Torn Achilles tendon, right, initial encounter 01/2020  . Vitamin D deficiency 02/2020    Past Surgical History:  Procedure Laterality Date  . BILATERAL SALPINGECTOMY Bilateral 11/28/2013   Procedure: BILATERAL SALPINGECTOMY;  Surgeon: Betsy Coder, MD;  Location: [facility];  Service: Obstetrics;  Laterality: Bilateral;  Partial on Left  . CESAREAN SECTION  05/14/2012   Procedure: CESAREAN SECTION;  Surgeon: Eldred Manges, MD;  Location: [facility];  Service: Gynecology;  Laterality: N/A;  Primary cesarean section of baby girl    at [place] 8/9  . CESAREAN SECTION N/A 11/28/2013   Procedure: CESAREAN SECTION;  Surgeon: Betsy Coder, MD;  Location: [facility];  Service: Obstetrics;  Laterality: N/A;  . CESAREAN SECTION      Family History  Problem Relation Age of Onset  . Hypertension Mother   . Diabetes Mellitus II Mother   . Breast cancer Mother 48  . Heart failure Mother   .  CAD Father   . Hyperlipidemia Father   . Heart disease Father   . Heart attack Father   . Hypertension Sister   . Diabetes Mellitus II Sister   . Diabetes Sister   . Thyroid disease Sister   . Anemia Sister   . Hypertension Sister   . Depression Sister   . Heart murmur Sister   . Heart murmur Daughter   . Anesthesia problems Neg Hx     Social History   Socioeconomic History  . Marital status: Married    Spouse name: Not on file  .  Number of children: 2  . Years of education: Not on file  . Highest education level: Not on file  Occupational History  . Not on file  Tobacco Use  . Smoking status: Never Smoker  . Smokeless tobacco: Never Used  Vaping Use  . Vaping Use: Never used  Substance and Sexual Activity  . Alcohol use: No  . Drug use: No  . Sexual activity: Yes    Birth control/protection: None  Other Topics Concern  . Not on file  Social History Narrative   ** Merged History Encounter **       Lives with husband and 2 children in a one story home.   Works as an Building control surveyor.   Education: college degree.   Social Determinants of Health   Financial Resource Strain:   . Difficulty of Paying Living Expenses: Not on file  Food Insecurity:   . Worried About Charity fundraiser in the Last Year: Not on file  . Ran Out of Food in the Last Year: Not on file  Transportation Needs:   . Lack of Transportation (Medical): Not on file  . Lack of Transportation (Non-Medical): Not on file  Physical Activity:   . Days of Exercise per Week: Not on file  . Minutes of Exercise per Session: Not on file  Stress:   . Feeling of Stress : Not on file  Social Connections:   . Frequency of Communication with Friends and Family: Not on file  . Frequency of Social Gatherings with Friends and Family: Not on file  . Attends Religious Services: Not on file  . Active Member of Clubs or Organizations: Not on file  . Attends Archivist Meetings: Not on file  . Marital Status: Not on file  Intimate Partner Violence:   . Fear of Current or Ex-Partner: Not on file  . Emotionally Abused: Not on file  . Physically Abused: Not on file  . Sexually Abused: Not on file    Outpatient Medications Prior to Visit  Medication Sig Dispense Refill  . acetaminophen (TYLENOL) 500 MG tablet Take 500 mg by mouth every 6 (six) hours as needed for mild pain or headache.    . ALPRAZolam (XANAX) 0.5 MG tablet Take 1 tablet (0.5  mg total) by mouth at bedtime as needed. 30 tablet 0  . amLODipine (NORVASC) 10 MG tablet Take 1 tablet (10 mg total) by mouth daily. 30 tablet 11  . aspirin EC 325 MG tablet Take 325 mg by mouth daily.    Marland Kitchen atorvastatin (LIPITOR) 10 MG tablet Take 10 mg by mouth daily.   1  . bisoprolol (ZEBETA) 5 MG tablet Take 1 tablet (5 mg total) by mouth daily. 30 tablet 2  . blood glucose meter kit and supplies Dispense based on patient and insurance preference. Use up to four times daily as directed. (FOR ICD-9 250.00, 250.01). 1  each 0  . Dulaglutide (TRULICITY) 6.57 QI/6.9GE SOPN Inject 0.75 mg into the skin once a week. 0.5 mL 11  . ferrous sulfate (FERROUSUL) 325 (65 FE) MG tablet Take 1 tablet (325 mg total) by mouth daily with breakfast. 30 tablet 0  . furosemide (LASIX) 20 MG tablet Take 1 tablet (20 mg total) by mouth daily. 30 tablet 11  . gabapentin (NEURONTIN) 300 MG capsule Take 1 capsule (300 mg total) by mouth 2 (two) times daily for 10 days. 20 capsule 0  . hydrochlorothiazide (HYDRODIURIL) 25 MG tablet Take 1 tablet (25 mg total) by mouth daily. 30 tablet 11  . losartan (COZAAR) 25 MG tablet Take 1 tablet (25 mg total) by mouth daily. 30 tablet 11  . metFORMIN (GLUCOPHAGE XR) 750 MG 24 hr tablet Take 1 tablet (750 mg total) by mouth daily with breakfast. 30 tablet 11  . busPIRone (BUSPAR) 10 MG tablet Take 1 tablet (10 mg total) by mouth 2 (two) times daily. (Patient not taking: Reported on 07/07/2020) 60 tablet 3  . meloxicam (MOBIC) 15 MG tablet Take 15 mg by mouth daily. (Patient not taking: Reported on 07/07/2020)    . Vitamin D, Ergocalciferol, (DRISDOL) 1.25 MG (50000 UNIT) CAPS capsule Take 1 capsule (50,000 Units total) by mouth every 7 (seven) days. (Patient not taking: Reported on 06/07/2020) 5 capsule 5  . HYDROcodone-acetaminophen (NORCO/VICODIN) 5-325 MG tablet Take 1 tablet by mouth every 4 (four) hours as needed. (Patient not taking: Reported on 07/07/2020) 10 tablet 0  .  ibuprofen (ADVIL,MOTRIN) 800 MG tablet Take 1 tablet (800 mg total) by mouth every 8 (eight) hours as needed for moderate pain. (Patient not taking: Reported on 07/07/2020) 21 tablet 0   No facility-administered medications prior to visit.    No Known Allergies  ROS Review of Systems  Constitutional: Negative.   HENT: Negative.   Eyes: Negative.   Respiratory: Negative.   Cardiovascular: Negative.   Gastrointestinal: Positive for abdominal distention.  Endocrine: Negative.   Genitourinary: Negative.   Musculoskeletal: Negative.   Skin: Negative.   Allergic/Immunologic: Negative.   Neurological: Positive for dizziness (occasional ) and headaches (occasional ).  Hematological: Negative.   Psychiatric/Behavioral: Negative.       Objective:    Physical Exam Vitals and nursing note reviewed.  Constitutional:      Appearance: She is obese.  HENT:     Head: Normocephalic and atraumatic.     Nose: Nose normal.     Mouth/Throat:     Mouth: Mucous membranes are moist.     Pharynx: Oropharynx is clear.  Cardiovascular:     Rate and Rhythm: Normal rate and regular rhythm.     Heart sounds: Normal heart sounds.  Pulmonary:     Effort: Pulmonary effort is normal.     Breath sounds: Normal breath sounds.  Abdominal:     General: Bowel sounds are normal. There is distension (obese).     Palpations: Abdomen is soft.  Musculoskeletal:        General: Normal range of motion.     Cervical back: Normal range of motion and neck supple.  Skin:    General: Skin is warm and dry.  Neurological:     General: No focal deficit present.     Mental Status: She is alert and oriented to person, place, and time.  Psychiatric:        Mood and Affect: Mood normal.        Behavior: Behavior normal.  Thought Content: Thought content normal.     BP 137/81   Pulse 70   Temp 97.7 F (36.5 C)   Wt (!) 338 lb (153.3 kg)   SpO2 100%   BMI 54.55 kg/m  Wt Readings from Last 3  Encounters:  07/07/20 (!) 338 lb (153.3 kg)  06/07/20 (!) 342 lb (155.1 kg)  03/09/20 (!) 342 lb 0.2 oz (155.1 kg)    Health Maintenance Due  Topic Date Due  . PNEUMOCOCCAL POLYSACCHARIDE VACCINE AGE 37-64 HIGH RISK  Never done  . FOOT EXAM  Never done  . OPHTHALMOLOGY EXAM  Never done  . COVID-19 Vaccine (1) Never done  . PAP SMEAR-Modifier  06/04/2008  . INFLUENZA VACCINE  04/25/2020    There are no preventive care reminders to display for this patient.  Lab Results  Component Value Date   TSH 2.470 03/09/2020   Lab Results  Component Value Date   WBC 6.4 07/07/2020   HGB 12.6 07/07/2020   HCT 38.9 07/07/2020   MCV 82 07/07/2020   PLT 322 07/07/2020   Lab Results  Component Value Date   NA 141 08/28/2017   K 4.5 08/28/2017   CO2 22 08/28/2017   GLUCOSE 99 08/28/2017   BUN 8 08/28/2017   CREATININE 0.67 08/28/2017   BILITOT 0.4 05/01/2017   ALKPHOS 91 05/01/2017   AST 22 05/01/2017   ALT 20 05/01/2017   PROT 8.1 05/01/2017   ALBUMIN 3.2 (L) 05/01/2017   CALCIUM 9.2 08/28/2017   ANIONGAP 7 05/03/2017   Lab Results  Component Value Date   CHOL 175 03/09/2020   Lab Results  Component Value Date   HDL 48 03/09/2020   Lab Results  Component Value Date   LDLCALC 112 (H) 03/09/2020   Lab Results  Component Value Date   TRIG 79 03/09/2020   Lab Results  Component Value Date   CHOLHDL 3.6 03/09/2020   Lab Results  Component Value Date   HGBA1C 7.0 (A) 06/07/2020   HGBA1C 7.0 06/07/2020   HGBA1C 7.0 (A) 06/07/2020   HGBA1C 7.0 06/07/2020    Assessment & Plan:   1. Weight loss - metFORMIN (GLUCOPHAGE XR) 500 MG 24 hr tablet; Take 1 tablet (500 mg total) by mouth in the morning and at bedtime.  Dispense: 60 tablet; Refill: 1  2. Class 3 severe obesity due to excess calories with serious comorbidity and body mass index (BMI) of 50.0 to 59.9 in adult [facility]) Body mass index is 54.55 kg/m. Goal BMI  is <30. Encouraged efforts to reduce weight include  engaging in physical activity as tolerated with goal of 150 minutes per week. Improve dietary choices and eat a meal regimen consistent with a Mediterranean or DASH diet. Reduce simple carbohydrates. Do not skip meals and eat healthy snacks throughout the day to avoid over-eating at dinner. Set a goal weight loss that is achievable for you. - metFORMIN (GLUCOPHAGE XR) 500 MG 24 hr tablet; Take 1 tablet (500 mg total) by mouth in the morning and at bedtime.  Dispense: 60 tablet; Refill: 1  3. Morbid obesity ([place])  4. Intractable migraine without status migrainosus, unspecified migraine type  5. Essential hypertension The current medical regimen is effective; blood pressure is stable at 137/81 today; continue present plan and medications as prescribed. She will continue to take medications as prescribed, to decrease high sodium intake, excessive alcohol intake, increase potassium intake, smoking cessation, and increase physical activity of at least 30 minutes of cardio activity  daily. She will continue to follow Heart Healthy or DASH diet. - CBC with Differential  6. Rash - hydrocortisone cream 1 %; Apply 1 application topically 2 (two) times daily.  Dispense: 30 g; Refill: 3  7. Itching - hydrocortisone cream 1 %; Apply 1 application topically 2 (two) times daily.  Dispense: 30 g; Refill: 3  8. Type 2 diabetes mellitus without complication, without long-term current use of insulin (H[facility]) She will continue medication as prescribed, to decrease foods/beverages high in sugars and carbs and follow Heart Healthy or DASH diet. Increase physical activity to at least 30 minutes cardio exercise daily.  - metFORMIN (GLUCOPHAGE XR) 500 MG 24 hr tablet; Take 1 tablet (500 mg total) by mouth in the morning and at bedtime.  Dispense: 60 tablet; Refill: 1  9. Hemoglobin A1c less than 7.0% - metFORMIN (GLUCOPHAGE XR) 500 MG 24 hr tablet; Take 1 tablet (500 mg total) by mouth in the morning and at bedtime.   Dispense: 60 tablet; Refill: 1  10. Hyperglycemia - metFORMIN (GLUCOPHAGE XR) 500 MG 24 hr tablet; Take 1 tablet (500 mg total) by mouth in the morning and at bedtime.  Dispense: 60 tablet; Refill: 1  11. Anxiety  12. Follow up She will follow up in 2 months.   Meds ordered this encounter  Medications  . hydrocortisone cream 1 %    Sig: Apply 1 application topically 2 (two) times daily.    Dispense:  30 g    Refill:  3  . DISCONTD: metFORMIN (GLUCOPHAGE XR) 750 MG 24 hr tablet    Sig: Take 1 tablet (750 mg total) by mouth 2 (two) times daily as needed.    Dispense:  60 tablet    Refill:  1  . metFORMIN (GLUCOPHAGE XR) 500 MG 24 hr tablet    Sig: Take 1 tablet (500 mg total) by mouth in the morning and at bedtime.    Dispense:  60 tablet    Refill:  1    Orders Placed This Encounter  Procedures  . CBC with Differential    Referral Orders  No referral(s) requested today    Kathe Becton,  MSN, FNP-BC [place],  [address] [phone]- fax   Problem List Items Addressed This Visit      Cardiovascular and Mediastinum   Essential hypertension   Relevant Orders   CBC with Differential (Completed)     Endocrine   Type 2 diabetes mellitus without complication (HCC)   Relevant Medications   metFORMIN (GLUCOPHAGE XR) 500 MG 24 hr tablet     Other   Anxiety   Class 3 severe obesity due to excess calories with serious comorbidity and body mass index (BMI) of 50.0 to 59.9 in adult [facility])   Relevant Medications   metFORMIN (GLUCOPHAGE XR) 500 MG 24 hr tablet   Hemoglobin A1c less than 7.0%   Relevant Medications   metFORMIN (GLUCOPHAGE XR) 500 MG 24 hr tablet   Morbid obesity (H[facility])   Relevant Medications   metFORMIN (GLUCOPHAGE XR) 500 MG 24 hr tablet    Other Visit Diagnoses    Weight loss    -  Primary   Relevant Medications    metFORMIN (GLUCOPHAGE XR) 500 MG 24 hr tablet   Intractable migraine without status migrainosus, unspecified migraine type       Rash       Relevant Medications  hydrocortisone cream 1 %   Itching       Relevant Medications   hydrocortisone cream 1 %   Hyperglycemia       Relevant Medications   metFORMIN (GLUCOPHAGE XR) 500 MG 24 hr tablet   Follow up          Meds ordered this encounter  Medications  . hydrocortisone cream 1 %    Sig: Apply 1 application topically 2 (two) times daily.    Dispense:  30 g    Refill:  3  . DISCONTD: metFORMIN (GLUCOPHAGE XR) 750 MG 24 hr tablet    Sig: Take 1 tablet (750 mg total) by mouth 2 (two) times daily as needed.    Dispense:  60 tablet    Refill:  1  . metFORMIN (GLUCOPHAGE XR) 500 MG 24 hr tablet    Sig: Take 1 tablet (500 mg total) by mouth in the morning and at bedtime.    Dispense:  60 tablet    Refill:  1    Follow-up: Return in about 2 months (around 09/06/2020).    Azzie Glatter, FNP

## 2020-07-08 LAB — CBC WITH DIFFERENTIAL/PLATELET
Basophils Absolute: 0 10*3/uL (ref 0.0–0.2)
Basos: 1 %
EOS (ABSOLUTE): 0.2 10*3/uL (ref 0.0–0.4)
Eos: 2 %
Hematocrit: 38.9 % (ref 34.0–46.6)
Hemoglobin: 12.6 g/dL (ref 11.1–15.9)
Immature Grans (Abs): 0 10*3/uL (ref 0.0–0.1)
Immature Granulocytes: 0 %
Lymphocytes Absolute: 3 10*3/uL (ref 0.7–3.1)
Lymphs: 48 %
MCH: 26.5 pg — ABNORMAL LOW (ref 26.6–33.0)
MCHC: 32.4 g/dL (ref 31.5–35.7)
MCV: 82 fL (ref 79–97)
Monocytes Absolute: 0.4 10*3/uL (ref 0.1–0.9)
Monocytes: 6 %
Neutrophils Absolute: 2.7 10*3/uL (ref 1.4–7.0)
Neutrophils: 43 %
Platelets: 322 10*3/uL (ref 150–450)
RBC: 4.75 x10E6/uL (ref 3.77–5.28)
RDW: 13.5 % (ref 11.7–15.4)
WBC: 6.4 10*3/uL (ref 3.4–10.8)

## 2020-07-12 ENCOUNTER — Encounter: Payer: Self-pay | Admitting: Family Medicine

## 2020-09-06 ENCOUNTER — Ambulatory Visit: Payer: Self-pay | Admitting: Family Medicine

## 2020-12-08 ENCOUNTER — Encounter: Payer: Self-pay | Admitting: Family Medicine

## 2020-12-08 ENCOUNTER — Telehealth (INDEPENDENT_AMBULATORY_CARE_PROVIDER_SITE_OTHER): Payer: Medicaid Other | Admitting: Family Medicine

## 2020-12-08 DIAGNOSIS — B349 Viral infection, unspecified: Secondary | ICD-10-CM | POA: Diagnosis not present

## 2020-12-08 DIAGNOSIS — R509 Fever, unspecified: Secondary | ICD-10-CM

## 2020-12-08 DIAGNOSIS — Z09 Encounter for follow-up examination after completed treatment for conditions other than malignant neoplasm: Secondary | ICD-10-CM

## 2020-12-08 DIAGNOSIS — R059 Cough, unspecified: Secondary | ICD-10-CM

## 2020-12-08 MED ORDER — BENZONATATE 100 MG PO CAPS: 100.0000 mg | ORAL_CAPSULE | Freq: Two times a day (BID) | ORAL | 0 refills | Status: DC | PRN

## 2020-12-08 MED ORDER — AMOXICILLIN-POT CLAVULANATE 875-125 MG PO TABS: 1.0000 | ORAL_TABLET | Freq: Two times a day (BID) | ORAL | 0 refills | Status: AC

## 2020-12-08 NOTE — Progress Notes (Signed)
Virtual Visit via Telephone Note  I connected with Autumn [name] on 12/08/20 at  3:35 PM EDT by telephone and verified that I am speaking with the correct person using two identifiers.  Location: Patient: Home   Provider: Office   I discussed the limitations, risks, security and privacy concerns of performing an evaluation and management service by telephone and the availability of in person appointments. I also discussed with the patient that there may be a patient responsible charge related to this service. The patient expressed understanding and agreed to proceed.   History of Present Illness:  Patient Active Problem List   Diagnosis Date Noted  . Weight gain 03/11/2020  . Heel spur, unspecified laterality 02/02/2020  . Hemoglobin A1c less than 7.0% 08/13/2019  . Class 3 severe obesity due to excess calories with serious comorbidity and body mass index (BMI) of 50.0 to 59.9 in adult ([facility]) 03/07/2019  . Anxiety 12/11/2018  . Peripheral edema 12/11/2018  . Type 2 diabetes mellitus without complication ([facility]) 05/03/2017  . Complicated migraine 05/02/2017  . Hypertensive urgency 05/02/2017  . Normocytic anemia 05/02/2017  . Left-sided weakness 05/02/2017  . Essential hypertension   . Myofascial pain 01/13/2016  . Weakness 01/13/2016  . Pain in limb 01/13/2016  . Chest pain at rest 12/14/2015  . Preeclampsia complicating hypertension 11/29/2013  . Chronic hypertension with superimposed preeclampsia 11/28/2013  . Positive GBS test 11/28/2013  . Chronic headaches 11/28/2013  . Leaky heart valve 05/12/2012  . Morbid obesity ([facility]) 01/22/2012   Current Status: Since her last office visit, she is doing well with no complaints. She has been having chest congestion, cough, nasal congestion, low-grade fevers, and fatigue X 1 week now. She tested negative for Covid19 via home testing. She denies fevers, chills, fatigue, recent infections, weight loss, and night sweats. She has not  had any headaches, visual changes, dizziness, and falls. No chest pain, heart palpitations, and shortness of breath reported. Denies GI problems such as nausea, vomiting, diarrhea, and constipation. She has no reports of blood in stools, dysuria and hematuria. No depression or anxiety reported today. She is taking all medications as prescribed. She denies pain today.   Observations/Objective:  Telephone Virtual Visit  Assessment and Plan:  1. Viral infection - amoxicillin-clavulanate (AUGMENTIN) 875-125 MG tablet; Take 1 tablet by mouth 2 (two) times daily for 10 days.  Dispense: 20 tablet; Refill: 0  2. Cough - benzonatate (TESSALON) 100 MG capsule; Take 1 capsule (100 mg total) by mouth 2 (two) times daily as needed for cough.  Dispense: 20 capsule; Refill: 0  3. Low grade fever Stable. She will take Acetaminophen as needed for fever.   4. Follow up She will follow up in 2 weeks.   Meds ordered this encounter  Medications  . amoxicillin-clavulanate (AUGMENTIN) 875-125 MG tablet    Sig: Take 1 tablet by mouth 2 (two) times daily for 10 days.    Dispense:  20 tablet    Refill:  0  . benzonatate (TESSALON) 100 MG capsule    Sig: Take 1 capsule (100 mg total) by mouth 2 (two) times daily as needed for cough.    Dispense:  20 capsule    Refill:  0    No orders of the defined types were placed in this encounter.   Referral Orders  No referral(s) requested today    Raliegh Ip, MSN, ANE, FNP-BC [facility] [address] [place]  [place], [place] [address] [phone] fax   I discussed the assessment and treatment plan with the patient. The patient was provided an opportunity to ask questions and all were answered. The patient agreed with the plan and demonstrated an understanding of the instructions.   The patient was advised to call back or seek an in-person evaluation if the  symptoms worsen or if the condition fails to improve as anticipated.  I provided 20 minutes of non-face-to-face time during this encounter.   Kallie Locks, FNP

## 2021-03-10 ENCOUNTER — Other Ambulatory Visit: Payer: Self-pay

## 2021-03-10 ENCOUNTER — Ambulatory Visit
Admission: RE | Admit: 2021-03-10 | Discharge: 2021-03-10 | Disposition: A | Discharge status: Home / Self Care | Payer: Medicaid Other | Source: Ambulatory Visit | Attending: Emergency Medicine | Admitting: Emergency Medicine

## 2021-03-10 VITALS — BP 125/85 | HR 89 | Temp 98.0°F | Resp 18

## 2021-03-10 DIAGNOSIS — R1030 Lower abdominal pain, unspecified: Secondary | ICD-10-CM | POA: Insufficient documentation

## 2021-03-10 DIAGNOSIS — R202 Paresthesia of skin: Secondary | ICD-10-CM | POA: Insufficient documentation

## 2021-03-10 LAB — POCT URINALYSIS DIP (MANUAL ENTRY)
Bilirubin, UA: NEGATIVE
Blood, UA: NEGATIVE
Glucose, UA: NEGATIVE mg/dL
Ketones, POC UA: NEGATIVE mg/dL
Leukocytes, UA: NEGATIVE
Nitrite, UA: NEGATIVE
Protein Ur, POC: NEGATIVE mg/dL
Spec Grav, UA: >=1.03 — AB (ref 1.010–1.025)
Urobilinogen, UA: 0.2 E.U./dL
pH, UA: 5.5 (ref 5.0–8.0)

## 2021-03-10 LAB — POCT URINE PREGNANCY: Preg Test, Ur: NEGATIVE

## 2021-03-10 MED ORDER — NAPROXEN 500 MG PO TABS: 500.0000 mg | ORAL_TABLET | Freq: Two times a day (BID) | ORAL | 0 refills | Status: DC

## 2021-03-10 NOTE — Discharge Instructions (Addendum)
Urine normal, vaginal swab pending to rule out any pelvic infections Please use Naprosyn twice daily with food as needed for cramping Continue warm compresses/heating pad to lower abdomen and monitor for resolution of symptoms and onset of menstrual cycle Symptoms persisting please follow-up with primary care/OB/GYN for further evaluation Avoid sleeping on bent arm/shoulder to avoid further nerve irritation May continue with Naprosyn for any neck/shoulder discomfort with the numbness and tingling Please follow-up if not improving or worsening

## 2021-03-10 NOTE — ED Provider Notes (Signed)
[name]    CSN: [id number] Arrival date & time: 03/10/21  0945      History   Chief Complaint Chief Complaint  Patient presents with   Abdominal Pain   Arm Pain    HPI Autumn [name] is a 35 y.o. female history of hypertension, DM type II, obesity, presenting today for evaluation of abdominal pain and left arm numbness.  Reports lower abdominal pain and cramping over the past week.  Mainly in her lower abdomen.  She denies any vaginal symptoms of abnormal discharge itching, irritation or abnormal bleeding.  Denies specific concerns for STDs as she has not been sexually active recently and is separated from her husband.  She is due to start her menstrual cycle in approximately 5 to 6 days, typically does not get cramping with menstrual cycle.  Had normal menstrual cycle at the end of May.  Denies urinary symptoms.  Denies any nausea or vomiting.  Appetite at baseline.  No change in abdominal pain with eating or drinking.  Reports bowels also at baseline.  Had normal bowel movement this morning.  Reports approximately 3-4 bowel movements per day as she is on metformin.  Denies any increasing straining, decrease in quantity of bowels.  Does report history of prior ovarian cyst when she was younger.  Has been using Tylenol and aspirin without relief.    Also reports left arm numbness tingling sensation.  She notices the symptoms more at nighttime, but will often be present during the day as well.  She reports that they are throughout her entire arm and not just her forearm or fingers.  Reports sensation throughout all 5 fingers, denies symptoms more prominent in 1 or the other.  Denies any associated elbow or wrist pain.  Denies injury or trauma.  Reports that she was told that she has "left-sided nerve damage from her elevated blood pressure".  Used to experience this more frequently in the past, but symptoms have eased off of recently.  This week is been more than normal.   Denies any weakness.  HPI  Past Medical History:  Diagnosis Date   Diabetes mellitus, type II ([place])    Essential hypertension    H/O varicella    Headache    Heart murmur    Heel spur, right 01/2020   Hypertension    stopped meds since pregnant   Leaky heart valve    mild MR by echo   Mini stroke ([employer])    2018    Obese    Prediabetes    Torn Achilles tendon, right, initial encounter 01/2020   Vitamin D deficiency 02/2020    Patient Active Problem List   Diagnosis Date Noted   Weight gain 03/11/2020   Heel spur, unspecified laterality 02/02/2020   Hemoglobin A1c less than 7.0% 08/13/2019   Class 3 severe obesity due to excess calories with serious comorbidity and body mass index (BMI) of 50.0 to 59.9 in adult ([place]) 03/07/2019   Anxiety 12/11/2018   Peripheral edema 12/11/2018   Type 2 diabetes mellitus without complication ([place]) 60/12/5995   Complicated migraine 74/14/2395   Hypertensive urgency 05/02/2017   Normocytic anemia 05/02/2017   Left-sided weakness 05/02/2017   Essential hypertension    Myofascial pain 01/13/2016   Weakness 01/13/2016   Pain in limb 01/13/2016   Chest pain at rest 12/14/2015   Preeclampsia complicating hypertension 11/29/2013   Chronic hypertension with superimposed preeclampsia 11/28/2013   Positive GBS test 11/28/2013   Chronic headaches  11/28/2013   Leaky heart valve 05/12/2012   Morbid obesity ([place]) 01/22/2012    Past Surgical History:  Procedure Laterality Date   BILATERAL SALPINGECTOMY Bilateral 11/28/2013   Procedure: BILATERAL SALPINGECTOMY;  Surgeon: Betsy Coder, MD;  Location: [facility];  Service: Obstetrics;  Laterality: Bilateral;  Partial on Left   CESAREAN SECTION  05/14/2012   Procedure: CESAREAN SECTION;  Surgeon: Eldred Manges, MD;  Location: [facility];  Service: Gynecology;  Laterality: N/A;  Primary cesarean section of baby girl    at [place] 8/9   CESAREAN SECTION N/A 11/28/2013   Procedure: CESAREAN SECTION;   Surgeon: Betsy Coder, MD;  Location: [facility];  Service: Obstetrics;  Laterality: N/A;   CESAREAN SECTION      OB History     Gravida  2   Para  2   Term  2   Preterm  0   AB  0   Living  2      SAB  0   IAB  0   Ectopic  0   Multiple      Live Births  2            Home Medications    Prior to Admission medications   Medication Sig Start Date End Date Taking? Authorizing Provider  naproxen (NAPROSYN) 500 MG tablet Take 1 tablet (500 mg total) by mouth 2 (two) times daily. 03/10/21  Yes Jacksyn Beeks C, PA-C  acetaminophen (TYLENOL) 500 MG tablet Take 500 mg by mouth every 6 (six) hours as needed for mild pain or headache.    [provider]  ALPRAZolam Duanne Moron) 0.5 MG tablet Take 1 tablet (0.5 mg total) by mouth at bedtime as needed. 06/07/20   Azzie Glatter, FNP  amLODipine (NORVASC) 10 MG tablet Take 1 tablet (10 mg total) by mouth daily. 02/02/20   Azzie Glatter, FNP  aspirin EC 325 MG tablet Take 325 mg by mouth daily.    [provider]  atorvastatin (LIPITOR) 10 MG tablet Take 10 mg by mouth daily.  10/23/15   [provider]  benzonatate (TESSALON) 100 MG capsule Take 1 capsule (100 mg total) by mouth 2 (two) times daily as needed for cough. 12/08/20   Azzie Glatter, FNP  bisoprolol (ZEBETA) 5 MG tablet Take 1 tablet (5 mg total) by mouth daily. 09/02/18   Azzie Glatter, FNP  blood glucose meter kit and supplies Dispense based on patient and insurance preference. Use up to four times daily as directed. (FOR ICD-9 250.00, 250.01). 05/03/17   Domenic Polite, MD  busPIRone (BUSPAR) 10 MG tablet Take 1 tablet (10 mg total) by mouth 2 (two) times daily. 11/13/18   Azzie Glatter, FNP  Dulaglutide (TRULICITY) 2.33 AQ/7.6AU SOPN Inject 0.75 mg into the skin once a week. 06/07/20   Azzie Glatter, FNP  ferrous sulfate (FERROUSUL) 325 (65 FE) MG tablet Take 1 tablet (325 mg total) by mouth daily with breakfast. 05/03/17    Domenic Polite, MD  furosemide (LASIX) 20 MG tablet Take 1 tablet (20 mg total) by mouth daily. 02/02/20   Azzie Glatter, FNP  gabapentin (NEURONTIN) 300 MG capsule Take 1 capsule (300 mg total) by mouth 2 (two) times daily for 10 days. 05/02/20 07/07/20  Henderly, Britni A, PA-C  hydrochlorothiazide (HYDRODIURIL) 25 MG tablet Take 1 tablet (25 mg total) by mouth daily. 02/02/20   Azzie Glatter, FNP  hydrocortisone cream 1 % Apply 1  application topically 2 (two) times daily. 07/07/20   Azzie Glatter, FNP  losartan (COZAAR) 25 MG tablet Take 1 tablet (25 mg total) by mouth daily. 02/02/20   Azzie Glatter, FNP  metFORMIN (GLUCOPHAGE XR) 500 MG 24 hr tablet Take 1 tablet (500 mg total) by mouth in the morning and at bedtime. 07/07/20   Azzie Glatter, FNP  Vitamin D, Ergocalciferol, (DRISDOL) 1.25 MG (50000 UNIT) CAPS capsule Take 1 capsule (50,000 Units total) by mouth every 7 (seven) days. 03/11/20   Azzie Glatter, FNP    Family History Family History  Problem Relation Age of Onset   Hypertension Mother    Diabetes Mellitus II Mother    Breast cancer Mother 40   Heart failure Mother    CAD Father    Hyperlipidemia Father    Heart disease Father    Heart attack Father    Hypertension Sister    Diabetes Mellitus II Sister    Diabetes Sister    Thyroid disease Sister    Anemia Sister    Hypertension Sister    Depression Sister    Heart murmur Sister    Heart murmur Daughter    Anesthesia problems Neg Hx     Social History Social History   Tobacco Use   Smoking status: Never   Smokeless tobacco: Never  Vaping Use   Vaping Use: Never used  Substance Use Topics   Alcohol use: No   Drug use: No     Allergies   Patient has no known allergies.   Review of Systems Review of Systems  Constitutional:  Negative for fever.  Respiratory:  Negative for shortness of breath.   Cardiovascular:  Negative for chest pain.  Gastrointestinal:  Positive for abdominal  pain. Negative for diarrhea, nausea and vomiting.  Genitourinary:  Negative for dysuria, flank pain, genital sores, hematuria, menstrual problem, vaginal bleeding, vaginal discharge and vaginal pain.  Musculoskeletal:  Negative for back pain.  Skin:  Negative for rash.  Neurological:  Positive for numbness. Negative for dizziness, light-headedness and headaches.    Physical Exam Triage Vital Signs ED Triage Vitals [03/10/21 0956]  Enc Vitals Group     BP (!) 178/100     Pulse Rate 89     Resp 18     Temp 98 F (36.7 C)     Temp Source Oral     SpO2 95 %     Weight      Height      Head Circumference      Peak Flow      Pain Score 6     Pain Loc      Pain Edu?      Excl. in [place]?    No data found.  Updated Vital Signs BP 125/85 (BP Location: Left Arm)   Pulse 89   Temp 98 F (36.7 C) (Oral)   Resp 18   SpO2 95%   Visual Acuity Right Eye Distance:   Left Eye Distance:   Bilateral Distance:    Right Eye Near:   Left Eye Near:    Bilateral Near:     Physical Exam Vitals and nursing note reviewed.  Constitutional:      Appearance: She is well-developed.     Comments: No acute distress  HENT:     Head: Normocephalic and atraumatic.     Nose: Nose normal.  Eyes:     Conjunctiva/sclera: Conjunctivae normal.  Cardiovascular:  Rate and Rhythm: Normal rate and regular rhythm.  Pulmonary:     Effort: Pulmonary effort is normal. No respiratory distress.     Breath sounds: Normal breath sounds.  Abdominal:     General: There is no distension.     Comments: Soft, nondistended, tender to palpation to bilateral lower quadrants, more prominent over suprapubic area, negative rebound, negative Rovsing, negative McBurney's  Musculoskeletal:        General: Normal range of motion.     Cervical back: Neck supple.     Comments: No obvious deformity noted to left arm, nontender to palpation throughout upper arm, lower arm distal radius ulna or throughout hand, full active  range of motion of all 5 fingers at MCP joints DIP and PIP joints, strength intact at shoulders 5/5 ankle bilaterally, grip strength 5/5 ankle bilaterally, sensation intact distally to dorsum and palmar surfaces, radial pulse 2+  Full active range of motion of neck, weakly positive Spurling's  Skin:    General: Skin is warm and dry.  Neurological:     Mental Status: She is alert and oriented to person, place, and time.     UC Treatments / Results  Labs (all labs ordered are listed, but only abnormal results are displayed) Labs Reviewed  POCT URINALYSIS DIP (MANUAL ENTRY) - Abnormal; Notable for the following components:      Result Value   Spec Grav, UA >=1.030 (*)    All other components within normal limits  POCT URINE PREGNANCY  CERVICOVAGINAL ANCILLARY ONLY    EKG   Radiology No results found.  Procedures Procedures (including critical care time)  Medications Ordered in UC Medications - No data to display  Initial Impression / Assessment and Plan / UC Course  I have reviewed the triage vital signs and the nursing notes.  Pertinent labs & imaging results that were available during my care of the patient were reviewed by me and considered in my medical decision making (see chart for details).  Clinical Course as of 03/10/21 1027  Thu Mar 10, 2021  1008 125/85 [HW]    Clinical Course User Index [HW] Laelyn Blumenthal C, PA-C    Abdominal pain-UA unremarkable, pregnancy test negative, cramping seems more related to pelvic etiology rather than GI.  Negative peritoneal signs.  Recommending use of Naprosyn for cramping in the meantime and monitoring for onset of menstrual cycle over the next 5 to 6 days.  Vaginal swab pending to rule out any vaginal infections contributing to cramping, lower suspicion of this.  Advised to follow-up with primary care/OB/GYN if having persistent cramping, may benefit from transvaginal ultrasound to further evaluate from her underlying ovarian  cysts.  Advised if pain becoming severe, worsening to follow-up in emergency room. Left arm paresthesias-strength intact, no associated weakness, distribution not consistent with carpal tunnel or cubital tunnel, possible shoulder impingement or cervical radiculopathy contributing, recommended avoiding sleeping on any flexed joint to avoid further nerve irritation, anti-inflammatories as needed and continued monitoring.  Follow-up if not improving or worsening Discussed strict return precautions. Patient verbalized understanding and is agreeable with plan.  Final Clinical Impressions(s) / UC Diagnoses   Final diagnoses:  Lower abdominal pain  Arm paresthesia, left     Discharge Instructions      Urine normal, vaginal swab pending to rule out any pelvic infections Please use Naprosyn twice daily with food as needed for cramping Continue warm compresses/heating pad to lower abdomen and monitor for resolution of symptoms and onset of menstrual  cycle Symptoms persisting please follow-up with primary care/OB/GYN for further evaluation Avoid sleeping on bent arm/shoulder to avoid further nerve irritation May continue with Naprosyn for any neck/shoulder discomfort with the numbness and tingling Please follow-up if not improving or worsening     ED Prescriptions     Medication Sig Dispense Auth. Provider   naproxen (NAPROSYN) 500 MG tablet Take 1 tablet (500 mg total) by mouth 2 (two) times daily. 30 tablet Elenore Wanninger, [place] C, PA-C      PDMP not reviewed this encounter.   Nelva Hauk, [place] C, PA-C 03/10/21 1027

## 2021-03-10 NOTE — ED Triage Notes (Signed)
Pt here for lower abd pain into back that feels like cramping; pt sts some left arm numbness when waking from sleep

## 2021-03-11 ENCOUNTER — Telehealth (HOSPITAL_COMMUNITY): Payer: Self-pay | Admitting: Emergency Medicine

## 2021-03-11 LAB — CERVICOVAGINAL ANCILLARY ONLY
Bacterial Vaginitis (gardnerella): POSITIVE — AB
Candida Glabrata: NEGATIVE
Candida Vaginitis: POSITIVE — AB
Chlamydia: NEGATIVE
Comment: NEGATIVE
Comment: NEGATIVE
Comment: NEGATIVE
Comment: NEGATIVE
Comment: NEGATIVE
Comment: NORMAL
Neisseria Gonorrhea: NEGATIVE
Trichomonas: NEGATIVE

## 2021-03-11 MED ORDER — FLUCONAZOLE 150 MG PO TABS: 150.0000 mg | ORAL_TABLET | Freq: Once | ORAL | 0 refills | Status: AC

## 2021-03-11 MED ORDER — METRONIDAZOLE 500 MG PO TABS
500.0000 mg | ORAL_TABLET | Freq: Two times a day (BID) | ORAL | 0 refills | Status: DC
Start: 2021-03-11 — End: 2021-05-20

## 2021-04-19 ENCOUNTER — Ambulatory Visit
Admission: EM | Admit: 2021-04-19 | Discharge: 2021-04-19 | Disposition: A | Discharge status: Home / Self Care | Payer: Medicaid Other | Attending: Student | Admitting: Student

## 2021-04-19 ENCOUNTER — Other Ambulatory Visit: Payer: Self-pay

## 2021-04-19 DIAGNOSIS — J208 Acute bronchitis due to other specified organisms: Secondary | ICD-10-CM | POA: Diagnosis not present

## 2021-04-19 DIAGNOSIS — Z20822 Contact with and (suspected) exposure to covid-19: Secondary | ICD-10-CM | POA: Diagnosis not present

## 2021-04-19 DIAGNOSIS — Z20828 Contact with and (suspected) exposure to other viral communicable diseases: Secondary | ICD-10-CM

## 2021-04-19 DIAGNOSIS — R059 Cough, unspecified: Secondary | ICD-10-CM

## 2021-04-19 DIAGNOSIS — E119 Type 2 diabetes mellitus without complications: Secondary | ICD-10-CM

## 2021-04-19 DIAGNOSIS — Z7984 Long term (current) use of oral hypoglycemic drugs: Secondary | ICD-10-CM

## 2021-04-19 DIAGNOSIS — E1169 Type 2 diabetes mellitus with other specified complication: Secondary | ICD-10-CM | POA: Diagnosis not present

## 2021-04-19 MED ORDER — BENZONATATE 100 MG PO CAPS: 100.0000 mg | ORAL_CAPSULE | Freq: Two times a day (BID) | ORAL | 0 refills | Status: DC | PRN

## 2021-04-19 MED ORDER — PREDNISONE 20 MG PO TABS: 40.0000 mg | ORAL_TABLET | Freq: Every day | ORAL | 0 refills | Status: AC

## 2021-04-19 MED ORDER — PROMETHAZINE-DM 6.25-15 MG/5ML PO SYRP: 5.0000 mL | ORAL_SOLUTION | Freq: Four times a day (QID) | ORAL | 0 refills | Status: DC | PRN

## 2021-04-19 NOTE — Discharge Instructions (Addendum)
-  Promethazine DM cough syrup for congestion/cough. This could make you drowsy, so take at night before bed. -Prednisone, 2 pills taken at the same time for 5 days in a row.  Try taking this earlier in the day as it can give you energy. Avoid NSAIDs like ibuprofen and alleve while taking this medication as they can increase your risk of stomach upset and even GI bleeding when in combination with a steroid. You can continue tylenol (acetaminophen) up to 1000mg  3x daily. -Tessalon (Benzonatate) as needed for cough. Take one pill up to 3x daily (every 8 hours) -Seek additional medical attention if symptoms worsen instead of improve, like shortness of breath, cough, fever/chills, chest pain, dizziness -With a virus, you're typically contagious for 5-7 days, or as long as you're having fevers.

## 2021-04-19 NOTE — ED Provider Notes (Signed)
[name]    CSN: [id number] Arrival date & time: 04/19/21  4825      History   Chief Complaint Chief Complaint  Patient presents with   Cough    HPI Autumn [name] is a 35 y.o. female presenting with viral symptoms x4 days following exposure to RSV. Medical history diabetes, hypetension, heart murmur, mini stroke, obesity, prediabetes.  Endorses hacking nonproductive cough, generalized body aches, nasal congestion.  Has not tried any medications for symptoms.  Denies history of pulmonary disease. Shortness of breath during coughing episodes. Denies fevers/chills, n/v/d, chest pain, facial pain, teeth pain, headaches, sore throat, loss of taste/smell, swollen lymph nodes, ear pain.    HPI  Past Medical History:  Diagnosis Date   Diabetes mellitus, type II ([place])    Essential hypertension    H/O varicella    Headache    Heart murmur    Heel spur, right 01/2020   Hypertension    stopped meds since pregnant   Leaky heart valve    mild MR by echo   Mini stroke ([place])    2018    Obese    Prediabetes    Torn Achilles tendon, right, initial encounter 01/2020   Vitamin D deficiency 02/2020    Patient Active Problem List   Diagnosis Date Noted   Weight gain 03/11/2020   Heel spur, unspecified laterality 02/02/2020   Hemoglobin A1c less than 7.0% 08/13/2019   Class 3 severe obesity due to excess calories with serious comorbidity and body mass index (BMI) of 50.0 to 59.9 in adult ([place]) 03/07/2019   Anxiety 12/11/2018   Peripheral edema 12/11/2018   Type 2 diabetes mellitus without complication ([place]) 00/37/0488   Complicated migraine 89/16/9450   Hypertensive urgency 05/02/2017   Normocytic anemia 05/02/2017   Left-sided weakness 05/02/2017   Essential hypertension    Myofascial pain 01/13/2016   Weakness 01/13/2016   Pain in limb 01/13/2016   Chest pain at rest 12/14/2015   Preeclampsia complicating hypertension 11/29/2013   Chronic hypertension  with superimposed preeclampsia 11/28/2013   Positive GBS test 11/28/2013   Chronic headaches 11/28/2013   Leaky heart valve 05/12/2012   Morbid obesity ([place]) 01/22/2012    Past Surgical History:  Procedure Laterality Date   BILATERAL SALPINGECTOMY Bilateral 11/28/2013   Procedure: BILATERAL SALPINGECTOMY;  Surgeon: Betsy Coder, MD;  Location: [facility];  Service: Obstetrics;  Laterality: Bilateral;  Partial on Left   CESAREAN SECTION  05/14/2012   Procedure: CESAREAN SECTION;  Surgeon: Eldred Manges, MD;  Location: [facility];  Service: Gynecology;  Laterality: N/A;  Primary cesarean section of baby girl    at Jud 8/9   CESAREAN SECTION N/A 11/28/2013   Procedure: CESAREAN SECTION;  Surgeon: Betsy Coder, MD;  Location: [facility];  Service: Obstetrics;  Laterality: N/A;   CESAREAN SECTION      OB History     Gravida  2   Para  2   Term  2   Preterm  0   AB  0   Living  2      SAB  0   IAB  0   Ectopic  0   Multiple      Live Births  2            Home Medications    Prior to Admission medications   Medication Sig Start Date End Date Taking? Authorizing Provider  predniSONE (DELTASONE) 20 MG tablet Take 2 tablets (40  mg total) by mouth daily for 5 days. 04/19/21 04/24/21 Yes Hazel Sams, PA-C  promethazine-dextromethorphan (PROMETHAZINE-DM) 6.25-15 MG/5ML syrup Take 5 mLs by mouth 4 (four) times daily as needed for cough. 04/19/21  Yes Hazel Sams, PA-C  acetaminophen (TYLENOL) 500 MG tablet Take 500 mg by mouth every 6 (six) hours as needed for mild pain or headache.    [provider]  ALPRAZolam Duanne Moron) 0.5 MG tablet Take 1 tablet (0.5 mg total) by mouth at bedtime as needed. 06/07/20   Azzie Glatter, FNP  amLODipine (NORVASC) 10 MG tablet Take 1 tablet (10 mg total) by mouth daily. 02/02/20   Azzie Glatter, FNP  aspirin EC 325 MG tablet Take 325 mg by mouth daily.    [provider]  atorvastatin (LIPITOR) 10 MG tablet Take  10 mg by mouth daily.  10/23/15   [provider]  benzonatate (TESSALON) 100 MG capsule Take 1 capsule (100 mg total) by mouth 2 (two) times daily as needed for cough. 04/19/21   Hazel Sams, PA-C  bisoprolol (ZEBETA) 5 MG tablet Take 1 tablet (5 mg total) by mouth daily. 09/02/18   Azzie Glatter, FNP  blood glucose meter kit and supplies Dispense based on patient and insurance preference. Use up to four times daily as directed. (FOR ICD-9 250.00, 250.01). 05/03/17   Domenic Polite, MD  busPIRone (BUSPAR) 10 MG tablet Take 1 tablet (10 mg total) by mouth 2 (two) times daily. 11/13/18   Azzie Glatter, FNP  Dulaglutide (TRULICITY) 2.95 MW/4.1LK SOPN Inject 0.75 mg into the skin once a week. 06/07/20   Azzie Glatter, FNP  ferrous sulfate (FERROUSUL) 325 (65 FE) MG tablet Take 1 tablet (325 mg total) by mouth daily with breakfast. 05/03/17   Domenic Polite, MD  furosemide (LASIX) 20 MG tablet Take 1 tablet (20 mg total) by mouth daily. 02/02/20   Azzie Glatter, FNP  gabapentin (NEURONTIN) 300 MG capsule Take 1 capsule (300 mg total) by mouth 2 (two) times daily for 10 days. 05/02/20 07/07/20  Henderly, Britni A, PA-C  hydrochlorothiazide (HYDRODIURIL) 25 MG tablet Take 1 tablet (25 mg total) by mouth daily. 02/02/20   Azzie Glatter, FNP  hydrocortisone cream 1 % Apply 1 application topically 2 (two) times daily. 07/07/20   Azzie Glatter, FNP  losartan (COZAAR) 25 MG tablet Take 1 tablet (25 mg total) by mouth daily. 02/02/20   Azzie Glatter, FNP  metFORMIN (GLUCOPHAGE XR) 500 MG 24 hr tablet Take 1 tablet (500 mg total) by mouth in the morning and at bedtime. 07/07/20   Azzie Glatter, FNP  metroNIDAZOLE (FLAGYL) 500 MG tablet Take 1 tablet (500 mg total) by mouth 2 (two) times daily. 03/11/21   Lamptey, Myrene Galas, MD  naproxen (NAPROSYN) 500 MG tablet Take 1 tablet (500 mg total) by mouth 2 (two) times daily. 03/10/21   Wieters, Hallie C, PA-C  Vitamin D, Ergocalciferol,  (DRISDOL) 1.25 MG (50000 UNIT) CAPS capsule Take 1 capsule (50,000 Units total) by mouth every 7 (seven) days. 03/11/20   Azzie Glatter, FNP    Family History Family History  Problem Relation Age of Onset   Hypertension Mother    Diabetes Mellitus II Mother    Breast cancer Mother 108   Heart failure Mother    CAD Father    Hyperlipidemia Father    Heart disease Father    Heart attack Father    Hypertension Sister  Diabetes Mellitus II Sister    Diabetes Sister    Thyroid disease Sister    Anemia Sister    Hypertension Sister    Depression Sister    Heart murmur Sister    Heart murmur Daughter    Anesthesia problems Neg Hx     Social History Social History   Tobacco Use   Smoking status: Never   Smokeless tobacco: Never  Vaping Use   Vaping Use: Never used  Substance Use Topics   Alcohol use: No   Drug use: No     Allergies   Patient has no known allergies.   Review of Systems Review of Systems  Constitutional:  Negative for appetite change, chills and fever.  HENT:  Positive for congestion. Negative for ear pain, rhinorrhea, sinus pressure, sinus pain and sore throat.   Eyes:  Negative for redness and visual disturbance.  Respiratory:  Positive for cough. Negative for chest tightness, shortness of breath and wheezing.   Cardiovascular:  Negative for chest pain and palpitations.  Gastrointestinal:  Negative for abdominal pain, constipation, diarrhea, nausea and vomiting.  Genitourinary:  Negative for dysuria, frequency and urgency.  Musculoskeletal:  Negative for myalgias.  Neurological:  Negative for dizziness, weakness and headaches.  Psychiatric/Behavioral:  Negative for confusion.   All other systems reviewed and are negative.   Physical Exam Triage Vital Signs ED Triage Vitals  Enc Vitals Group     BP 04/19/21 1023 (!) 142/91     Pulse Rate 04/19/21 1023 88     Resp 04/19/21 1023 20     Temp --      Temp src --      SpO2 04/19/21 1023 94 %      Weight --      Height --      Head Circumference --      Peak Flow --      Pain Score 04/19/21 1021 10     Pain Loc --      Pain Edu? --      Excl. in [place]? --    No data found.  Updated Vital Signs BP (!) 142/91   Pulse 88   Temp 97.8 F (36.6 C)   Resp 20   LMP 04/19/2021   SpO2 94%   Visual Acuity Right Eye Distance:   Left Eye Distance:   Bilateral Distance:    Right Eye Near:   Left Eye Near:    Bilateral Near:     Physical Exam Vitals reviewed.  Constitutional:      General: She is not in acute distress.    Appearance: Normal appearance. She is not ill-appearing.  HENT:     Head: Normocephalic and atraumatic.     Right Ear: Tympanic membrane, ear canal and external ear normal. No tenderness. No middle ear effusion. There is no impacted cerumen. Tympanic membrane is not perforated, erythematous, retracted or bulging.     Left Ear: Tympanic membrane, ear canal and external ear normal. No tenderness.  No middle ear effusion. There is no impacted cerumen. Tympanic membrane is not perforated, erythematous, retracted or bulging.     Nose: Nose normal. No congestion.     Mouth/Throat:     Mouth: Mucous membranes are moist.     Pharynx: Uvula midline. No oropharyngeal exudate or posterior oropharyngeal erythema.  Eyes:     Extraocular Movements: Extraocular movements intact.     Pupils: Pupils are equal, round, and reactive to light.  Cardiovascular:  Rate and Rhythm: Normal rate and regular rhythm.     Heart sounds: Normal heart sounds.  Pulmonary:     Effort: Pulmonary effort is normal.     Breath sounds: Normal breath sounds. No decreased breath sounds, wheezing, rhonchi or rales.     Comments: Frequent hacking cough Abdominal:     Palpations: Abdomen is soft.     Tenderness: There is no abdominal tenderness. There is no guarding or rebound.  Neurological:     General: No focal deficit present.     Mental Status: She is alert and oriented to person,  place, and time.  Psychiatric:        Mood and Affect: Mood normal.        Behavior: Behavior normal.        Thought Content: Thought content normal.        Judgment: Judgment normal.     UC Treatments / Results  Labs (all labs ordered are listed, but only abnormal results are displayed) Labs Reviewed  COVID-19, FLU A+B AND RSV    EKG   Radiology No results found.  Procedures Procedures (including critical care time)  Medications Ordered in UC Medications - No data to display  Initial Impression / Assessment and Plan / UC Course  I have reviewed the triage vital signs and the nursing notes.  Pertinent labs & imaging results that were available during my care of the patient were reviewed by me and considered in my medical decision making (see chart for details).     This patient is a very pleasant 35 y.o. year old female presenting with suspected RSV. Today this pt is afebrile nontachycardic nontachypneic, oxygenating well on room air, no wheezes rhonchi or rales. She does not have history pulmonary disease.  She is a diabetic. A1c 7.0 on 06/07/20. Continue current regimen. Will treat with prednisone, promethazine, tessalon as below.  Covid, RSV, influenza swab sent.  States she is not pregnant- currently menstruating.   Work note provided. ED return precautions discussed. Patient verbalizes understanding and agreement.   Coding this visit a Level 4 for acute illness with systemic symptoms and prescription drug management.  Final Clinical Impressions(s) / UC Diagnoses   Final diagnoses:  Exposure to respiratory syncytial virus (RSV)  Viral bronchitis  Type 2 diabetes mellitus with other specified complication, without long-term current use of insulin ([place])  Diabetes mellitus treated with oral medication ([place])  Cough     Discharge Instructions      -Promethazine DM cough syrup for congestion/cough. This could make you drowsy, so take at night before  bed. -Prednisone, 2 pills taken at the same time for 5 days in a row.  Try taking this earlier in the day as it can give you energy. Avoid NSAIDs like ibuprofen and alleve while taking this medication as they can increase your risk of stomach upset and even GI bleeding when in combination with a steroid. You can continue tylenol (acetaminophen) up to 1042m 3x daily. -Tessalon (Benzonatate) as needed for cough. Take one pill up to 3x daily (every 8 hours) -Seek additional medical attention if symptoms worsen instead of improve, like shortness of breath, cough, fever/chills, chest pain, dizziness -With a virus, you're typically contagious for 5-7 days, or as long as you're having fevers.       ED Prescriptions     Medication Sig Dispense Auth. Provider   benzonatate (TESSALON) 100 MG capsule Take 1 capsule (100 mg total) by mouth 2 (two) times daily  as needed for cough. 20 capsule Hazel Sams, PA-C   promethazine-dextromethorphan (PROMETHAZINE-DM) 6.25-15 MG/5ML syrup Take 5 mLs by mouth 4 (four) times daily as needed for cough. 118 mL Hazel Sams, PA-C   predniSONE (DELTASONE) 20 MG tablet Take 2 tablets (40 mg total) by mouth daily for 5 days. 10 tablet Hazel Sams, PA-C      PDMP not reviewed this encounter.   Hazel Sams, PA-C 04/19/21 1135

## 2021-04-19 NOTE — ED Triage Notes (Signed)
Pt presents with complaints of cough, body aches, and congestion since Saturday. Pt has been exposed to RSV. Denies any fever.

## 2021-04-20 LAB — COVID-19, FLU A+B AND RSV
Influenza A, NAA: NOT DETECTED
Influenza B, NAA: NOT DETECTED
RSV, NAA: NOT DETECTED
SARS-CoV-2, NAA: NOT DETECTED

## 2021-05-18 ENCOUNTER — Ambulatory Visit: Payer: Self-pay | Admitting: Nurse Practitioner

## 2021-05-20 ENCOUNTER — Encounter: Payer: Self-pay | Admitting: Nurse Practitioner

## 2021-05-20 ENCOUNTER — Other Ambulatory Visit: Payer: Self-pay

## 2021-05-20 ENCOUNTER — Ambulatory Visit (INDEPENDENT_AMBULATORY_CARE_PROVIDER_SITE_OTHER): Payer: Medicaid Other | Admitting: Nurse Practitioner

## 2021-05-20 VITALS — BP 183/93 | HR 82 | Temp 98.0°F | Ht 66.0 in | Wt 328.0 lb

## 2021-05-20 DIAGNOSIS — R102 Pelvic and perineal pain: Secondary | ICD-10-CM | POA: Diagnosis not present

## 2021-05-20 DIAGNOSIS — E119 Type 2 diabetes mellitus without complications: Secondary | ICD-10-CM

## 2021-05-20 DIAGNOSIS — Z6841 Body Mass Index (BMI) 40.0 and over, adult: Secondary | ICD-10-CM

## 2021-05-20 DIAGNOSIS — I10 Essential (primary) hypertension: Secondary | ICD-10-CM

## 2021-05-20 DIAGNOSIS — R609 Edema, unspecified: Secondary | ICD-10-CM

## 2021-05-20 DIAGNOSIS — N926 Irregular menstruation, unspecified: Secondary | ICD-10-CM

## 2021-05-20 DIAGNOSIS — E7849 Other hyperlipidemia: Secondary | ICD-10-CM

## 2021-05-20 LAB — POCT GLYCOSYLATED HEMOGLOBIN (HGB A1C)
HbA1c POC (<> result, manual entry): 6.7 % (ref 4.0–5.6)
HbA1c, POC (controlled diabetic range): 6.7 % (ref 0.0–7.0)
HbA1c, POC (prediabetic range): 6.7 % — AB (ref 5.7–6.4)
Hemoglobin A1C: 6.7 % — AB (ref 4.0–5.6)

## 2021-05-20 MED ORDER — METFORMIN HCL ER 750 MG PO TB24: 750.0000 mg | ORAL_TABLET | Freq: Two times a day (BID) | ORAL | 3 refills | Status: DC

## 2021-05-20 MED ORDER — NAPROXEN 500 MG PO TABS: 500.0000 mg | ORAL_TABLET | Freq: Two times a day (BID) | ORAL | 0 refills | Status: DC

## 2021-05-20 MED ORDER — AMLODIPINE BESYLATE 10 MG PO TABS: 10.0000 mg | ORAL_TABLET | Freq: Every day | ORAL | 11 refills | Status: DC

## 2021-05-20 MED ORDER — FUROSEMIDE 20 MG PO TABS: 20.0000 mg | ORAL_TABLET | Freq: Every day | ORAL | 11 refills | Status: DC

## 2021-05-20 MED ORDER — TRULICITY 0.75 MG/0.5ML ~~LOC~~ SOAJ: 0.7500 mg | SUBCUTANEOUS | 11 refills | Status: DC

## 2021-05-20 MED ORDER — ATORVASTATIN CALCIUM 10 MG PO TABS: 10.0000 mg | ORAL_TABLET | Freq: Every day | ORAL | 1 refills | Status: DC

## 2021-05-20 MED ORDER — HYDROCHLOROTHIAZIDE 25 MG PO TABS: 25.0000 mg | ORAL_TABLET | Freq: Every day | ORAL | 11 refills | Status: DC

## 2021-05-20 MED ORDER — LOSARTAN POTASSIUM 25 MG PO TABS: 25.0000 mg | ORAL_TABLET | Freq: Every day | ORAL | 11 refills | Status: DC

## 2021-05-20 NOTE — Progress Notes (Signed)
[place] [place], [place]  [address] Phone:  [phone]   Fax:  [phone] Subjective:   Patient ID: Autumn [name], female    DOB: 1985-10-30, 35 y.o.   MRN: [id number]  Chief Complaint  Patient presents with   Follow-up    Pelvic pain, going on 2 months. Irregular cycles does not have gyn   HPI Autumn [name] 35 y.o. female presents with history of hypertension, type 2 diabetes and morbid obesity to the [facility] for follow-up of chronic illnesses and pelvic pain.  Patient states she has had pelvic pain since June 2022.  Describes pain as aching and rates an 8 out of 10.  Pain disappears when patient is on menstrual cycle, but then subsequently returned.  Pain is located in diffuse pelvis.  Denies any sexual activity in the past 6 months.  Denies any vaginal discharge, odor or discomfort.  Was treated for symptoms in urgent care, and prescribed Flagyl and Diflucan.  Took all medications as prescribed with temporary relief in symptoms.  States that she was told that she would need a referral for gynecology, but has not received referral.  Endorses having abnormal menstrual cycles for the past 2 years, with some cycles being heavier or lighter.  Endorses having some months without a cycle.  Denies any chest pain, shortness of breath, headache or dizziness.  States that she is compliant with all blood pressure and diabetes medicine. Last dose on medications on Monday, requesting a refill.   Past Medical History:  Diagnosis Date   Diabetes mellitus, type II ([place])    Essential hypertension    H/O varicella    Headache    Heart murmur    Heel spur, right 01/2020   Hypertension    stopped meds since pregnant   Leaky heart valve    mild MR by echo   Mini stroke ([place])    2018    Obese    Prediabetes    Torn Achilles tendon, right, initial encounter 01/2020   Vitamin D deficiency 02/2020    Past Surgical History:  Procedure  Laterality Date   BILATERAL SALPINGECTOMY Bilateral 11/28/2013   Procedure: BILATERAL SALPINGECTOMY;  Surgeon: Betsy Coder, MD;  Location: [facility];  Service: Obstetrics;  Laterality: Bilateral;  Partial on Left   CESAREAN SECTION  05/14/2012   Procedure: CESAREAN SECTION;  Surgeon: Eldred Manges, MD;  Location: [facility];  Service: Gynecology;  Laterality: N/A;  Primary cesarean section of baby girl    at [facility] 8/9   CESAREAN SECTION N/A 11/28/2013   Procedure: CESAREAN SECTION;  Surgeon: Betsy Coder, MD;  Location: [facility];  Service: Obstetrics;  Laterality: N/A;   CESAREAN SECTION      Family History  Problem Relation Age of Onset   Hypertension Mother    Diabetes Mellitus II Mother    Breast cancer Mother 71   Heart failure Mother    CAD Father    Hyperlipidemia Father    Heart disease Father    Heart attack Father    Hypertension Sister    Diabetes Mellitus II Sister    Diabetes Sister    Thyroid disease Sister    Anemia Sister    Hypertension Sister    Depression Sister    Heart murmur Sister    Heart murmur Daughter    Anesthesia problems Neg Hx     Social History   Socioeconomic History   Marital  status: Married    Spouse name: Not on file   Number of children: 2   Years of education: Not on file   Highest education level: Not on file  Occupational History   Not on file  Tobacco Use   Smoking status: Never   Smokeless tobacco: Never  Vaping Use   Vaping Use: Never used  Substance and Sexual Activity   Alcohol use: No   Drug use: No   Sexual activity: Yes    Birth control/protection: None  Other Topics Concern   Not on file  Social History Narrative   ** Merged History Encounter **       Lives with husband and 2 children in a one story home.   Works as an Building control surveyor.   Education: college degree.   Social Determinants of Health   Financial Resource Strain: Not on file  Food Insecurity: Not on file  Transportation Needs: Not on file   Physical Activity: Not on file  Stress: Not on file  Social Connections: Not on file  Intimate Partner Violence: Not on file    Outpatient Medications Prior to Visit  Medication Sig Dispense Refill   acetaminophen (TYLENOL) 500 MG tablet Take 500 mg by mouth every 6 (six) hours as needed for mild pain or headache.     aspirin EC 325 MG tablet Take 325 mg by mouth daily.     bisoprolol (ZEBETA) 5 MG tablet Take 1 tablet (5 mg total) by mouth daily. 30 tablet 2   blood glucose meter kit and supplies Dispense based on patient and insurance preference. Use up to four times daily as directed. (FOR ICD-9 250.00, 250.01). 1 each 0   busPIRone (BUSPAR) 10 MG tablet Take 1 tablet (10 mg total) by mouth 2 (two) times daily. 60 tablet 3   amLODipine (NORVASC) 10 MG tablet Take 1 tablet (10 mg total) by mouth daily. 30 tablet 11   atorvastatin (LIPITOR) 10 MG tablet Take 10 mg by mouth daily.   1   Dulaglutide (TRULICITY) 9.14 NW/2.9FA SOPN Inject 0.75 mg into the skin once a week. 0.5 mL 11   furosemide (LASIX) 20 MG tablet Take 1 tablet (20 mg total) by mouth daily. 30 tablet 11   hydrochlorothiazide (HYDRODIURIL) 25 MG tablet Take 1 tablet (25 mg total) by mouth daily. 30 tablet 11   losartan (COZAAR) 25 MG tablet Take 1 tablet (25 mg total) by mouth daily. 30 tablet 11   metFORMIN (GLUCOPHAGE XR) 500 MG 24 hr tablet Take 1 tablet (500 mg total) by mouth in the morning and at bedtime. 60 tablet 1   metFORMIN (GLUCOPHAGE-XR) 750 MG 24 hr tablet Take 750 mg by mouth 2 (two) times daily.     naproxen (NAPROSYN) 500 MG tablet Take 1 tablet (500 mg total) by mouth 2 (two) times daily. 30 tablet 0   ALPRAZolam (XANAX) 0.5 MG tablet Take 1 tablet (0.5 mg total) by mouth at bedtime as needed. (Patient not taking: Reported on 05/20/2021) 30 tablet 0   ferrous sulfate (FERROUSUL) 325 (65 FE) MG tablet Take 1 tablet (325 mg total) by mouth daily with breakfast. (Patient not taking: Reported on 05/20/2021) 30  tablet 0   benzonatate (TESSALON) 100 MG capsule Take 1 capsule (100 mg total) by mouth 2 (two) times daily as needed for cough. 20 capsule 0   gabapentin (NEURONTIN) 300 MG capsule Take 1 capsule (300 mg total) by mouth 2 (two) times daily for 10 days. [place]  capsule 0   hydrocortisone cream 1 % Apply 1 application topically 2 (two) times daily. (Patient not taking: Reported on 05/20/2021) 30 g 3   metroNIDAZOLE (FLAGYL) 500 MG tablet Take 1 tablet (500 mg total) by mouth 2 (two) times daily. 14 tablet 0   promethazine-dextromethorphan (PROMETHAZINE-DM) 6.25-15 MG/5ML syrup Take 5 mLs by mouth 4 (four) times daily as needed for cough. 118 mL 0   Vitamin D, Ergocalciferol, (DRISDOL) 1.25 MG (50000 UNIT) CAPS capsule Take 1 capsule (50,000 Units total) by mouth every 7 (seven) days. 5 capsule 5   No facility-administered medications prior to visit.    No Known Allergies  Review of Systems  Constitutional:  Negative for chills, fever and malaise/fatigue.  Eyes: Negative.   Respiratory:  Negative for cough and shortness of breath.   Cardiovascular:  Negative for chest pain, palpitations and leg swelling.  Gastrointestinal:  Negative for abdominal pain, blood in stool, constipation, diarrhea, nausea and vomiting.  Genitourinary:        Pelvic pain   Skin: Negative.   Neurological: Negative.   Psychiatric/Behavioral:  Negative for depression. The patient is not nervous/anxious.   All other systems reviewed and are negative.     Objective:    Physical Exam Vitals reviewed.  Constitutional:      General: She is not in acute distress.    Appearance: Normal appearance. She is obese.  HENT:     Head: Normocephalic.  Cardiovascular:     Rate and Rhythm: Normal rate and regular rhythm.     Pulses: Normal pulses.     Heart sounds: Normal heart sounds.     Comments: No obvious peripheral edema Pulmonary:     Effort: Pulmonary effort is normal.     Breath sounds: Normal breath sounds.   Genitourinary:    Comments: Exam deferred Skin:    General: Skin is warm and dry.     Capillary Refill: Capillary refill takes less than 2 seconds.  Neurological:     General: No focal deficit present.     Mental Status: She is alert and oriented to person, place, and time.  Psychiatric:        Mood and Affect: Mood normal.        Behavior: Behavior normal.        Thought Content: Thought content normal.        Judgment: Judgment normal.    BP (!) 183/93 (BP Location: Right Arm, Patient Position: Sitting)   Pulse 82   Temp 98 F (36.7 C)   Ht 5' 6"  (1.676 m)   Wt (!) 328 lb 0.6 oz (148.8 kg)   SpO2 97%   BMI 52.95 kg/m  Wt Readings from Last 3 Encounters:  05/20/21 (!) 328 lb 0.6 oz (148.8 kg)  07/07/20 (!) 338 lb (153.3 kg)  06/07/20 (!) 342 lb (155.1 kg)    Immunization History  Administered Date(s) Administered   Influenza Split 06/28/2012   Tdap 05/15/2012    Diabetic Foot Exam - Simple   No data filed     Lab Results  Component Value Date   TSH 2.470 03/09/2020   Lab Results  Component Value Date   WBC 6.4 07/07/2020   HGB 12.6 07/07/2020   HCT 38.9 07/07/2020   MCV 82 07/07/2020   PLT 322 07/07/2020   Lab Results  Component Value Date   NA 141 08/28/2017   K 4.5 08/28/2017   CO2 22 08/28/2017   GLUCOSE 99 08/28/2017  BUN 8 08/28/2017   CREATININE 0.67 08/28/2017   BILITOT 0.4 05/01/2017   ALKPHOS 91 05/01/2017   AST 22 05/01/2017   ALT 20 05/01/2017   PROT 8.1 05/01/2017   ALBUMIN 3.2 (L) 05/01/2017   CALCIUM 9.2 08/28/2017   ANIONGAP 7 05/03/2017   Lab Results  Component Value Date   CHOL 175 03/09/2020   CHOL 186 11/13/2018   CHOL 178 08/28/2017   Lab Results  Component Value Date   HDL 48 03/09/2020   HDL 49 11/13/2018   HDL 51 08/28/2017   Lab Results  Component Value Date   LDLCALC 112 (H) 03/09/2020   LDLCALC 114 (H) 11/13/2018   LDLCALC 108 (H) 08/28/2017   Lab Results  Component Value Date   TRIG 79 03/09/2020    TRIG 115 11/13/2018   TRIG 95 08/28/2017   Lab Results  Component Value Date   CHOLHDL 3.6 03/09/2020   CHOLHDL 3.8 11/13/2018   CHOLHDL 3.5 08/28/2017   Lab Results  Component Value Date   HGBA1C 6.7 (A) 05/20/2021   HGBA1C 6.7 05/20/2021   HGBA1C 6.7 (A) 05/20/2021   HGBA1C 6.7 05/20/2021       Assessment & Plan:   Problem List Items Addressed This Visit       Cardiovascular and Mediastinum   Essential hypertension - Primary   Relevant Medications   amLODipine (NORVASC) 10 MG tablet   atorvastatin (LIPITOR) 10 MG tablet   furosemide (LASIX) 20 MG tablet   losartan (COZAAR) 25 MG tablet   hydrochlorothiazide (HYDRODIURIL) 25 MG tablet   Other Relevant Orders   Urinalysis Dipstick   CBC with Differential   Comprehensive metabolic panel Encouraged continued diet and exercise efforts  Encouraged continued compliance with medication       Endocrine   Type 2 diabetes mellitus without complication (HCC)   Relevant Medications   Dulaglutide (TRULICITY) 9.48 NI/6.2VO SOPN   atorvastatin (LIPITOR) 10 MG tablet   losartan (COZAAR) 25 MG tablet   metFORMIN (GLUCOPHAGE-XR) 750 MG 24 hr tablet   Other Relevant Orders   Urinalysis Dipstick   HgB A1c (Completed) Encouraged continued diet and exercise efforts  Encouraged continued compliance with medication       Other   Peripheral edema   Relevant Medications   furosemide (LASIX) 20 MG tablet   Class 3 severe obesity due to excess calories with serious comorbidity and body mass index (BMI) of 50.0 to 59.9 in adult [facility])   Relevant Medications   Dulaglutide (TRULICITY) 3.50 KX/3.8HW SOPN   metFORMIN (GLUCOPHAGE-XR) 750 MG 24 hr tablet   Other Relevant Orders   TSH   T3, Free   T4, Free Encouraged continued diet and exercise efforts  Encouraged continued compliance with medication  Discussed weight loss options. Patient to complete food/meal journal and bring to follow up appointment    Other Visit Diagnoses      Pelvic pain       Relevant Medications   naproxen (NAPROSYN) 500 MG tablet   Other Relevant Orders   US Transvaginal Non-OB   Ambulatory referral to Gynecology Low suspicion for infectious process, given patient history of treatment for BV with no improvement in symptoms. Concerned for ovarian cyst v other intrauterine abnormality Patient informed to continue taking Naproxyn as needed for pain   Irregular menstrual cycle       Relevant Orders   PCOS Diagnostic Profile   TSH   T3, Free   T4, Free   Follow up in  4 wks for reevaluation of symptoms, chronic illness and weight management    I have discontinued Babygirl L. Kant's Vitamin D (Ergocalciferol), gabapentin, hydrocortisone cream, metFORMIN, metroNIDAZOLE, benzonatate, and promethazine-dextromethorphan. I have also changed her atorvastatin and metFORMIN. Additionally, I am having her maintain her aspirin EC, acetaminophen, ferrous sulfate, blood glucose meter kit and supplies, bisoprolol, busPIRone, ALPRAZolam, Trulicity, amLODipine, furosemide, losartan, hydrochlorothiazide, and naproxen.  Meds ordered this encounter  Medications   Dulaglutide (TRULICITY) 6.54 YT/0.3TW SOPN    Sig: Inject 0.75 mg into the skin once a week.    Dispense:  0.5 mL    Refill:  11   amLODipine (NORVASC) 10 MG tablet    Sig: Take 1 tablet (10 mg total) by mouth daily.    Dispense:  30 tablet    Refill:  11   atorvastatin (LIPITOR) 10 MG tablet    Sig: Take 1 tablet (10 mg total) by mouth daily.    Dispense:  90 tablet    Refill:  1   furosemide (LASIX) 20 MG tablet    Sig: Take 1 tablet (20 mg total) by mouth daily.    Dispense:  30 tablet    Refill:  11   losartan (COZAAR) 25 MG tablet    Sig: Take 1 tablet (25 mg total) by mouth daily.    Dispense:  30 tablet    Refill:  11   hydrochlorothiazide (HYDRODIURIL) 25 MG tablet    Sig: Take 1 tablet (25 mg total) by mouth daily.    Dispense:  30 tablet    Refill:  11   metFORMIN  (GLUCOPHAGE-XR) 750 MG 24 hr tablet    Sig: Take 1 tablet (750 mg total) by mouth 2 (two) times daily.    Dispense:  60 tablet    Refill:  3   naproxen (NAPROSYN) 500 MG tablet    Sig: Take 1 tablet (500 mg total) by mouth 2 (two) times daily.    Dispense:  30 tablet    Refill:  0     Teena Dunk, NP

## 2021-05-20 NOTE — Patient Instructions (Signed)
You were seen at the [facility] today for your Chief Complaint  Patient presents with   Follow-up    Pelvic pain, going on 2 months. Irregular cycles does not have gyn  . We collected labs  Lab Orders         PCOS Diagnostic Profile         TSH         T3, Free         T4, Free         CBC with Differential         Comprehensive metabolic panel         Urinalysis Dipstick         HgB A1c    today.   Your results will be available via MyChart. We refilled all your medications. Please continue taking regularly prescribed medications as discussed during your visit. Please maintain your follow up appointment in 4 wks for reevaluation of symptoms. Referral was sent to gynecology, they will call you for an appointment. Please bring your food journal with you to your next appointment, as discussed during our visit today.

## 2021-05-29 LAB — CBC WITH DIFFERENTIAL/PLATELET
Basophils Absolute: 0 10*3/uL (ref 0.0–0.2)
Basos: 1 %
EOS (ABSOLUTE): 0.2 10*3/uL (ref 0.0–0.4)
Eos: 2 %
Hematocrit: 36.6 % (ref 34.0–46.6)
Hemoglobin: 11.9 g/dL (ref 11.1–15.9)
Immature Grans (Abs): 0 10*3/uL (ref 0.0–0.1)
Immature Granulocytes: 0 %
Lymphocytes Absolute: 3.2 10*3/uL — ABNORMAL HIGH (ref 0.7–3.1)
Lymphs: 43 %
MCH: 25.5 pg — ABNORMAL LOW (ref 26.6–33.0)
MCHC: 32.5 g/dL (ref 31.5–35.7)
MCV: 79 fL (ref 79–97)
Monocytes Absolute: 0.4 10*3/uL (ref 0.1–0.9)
Monocytes: 5 %
Neutrophils Absolute: 3.6 10*3/uL (ref 1.4–7.0)
Neutrophils: 49 %
Platelets: 293 10*3/uL (ref 150–450)
RBC: 4.66 x10E6/uL (ref 3.77–5.28)
RDW: 13.9 % (ref 11.7–15.4)
WBC: 7.4 10*3/uL (ref 3.4–10.8)

## 2021-05-29 LAB — PCOS DIAGNOSTIC PROFILE
17-Alpha-Hydroxyprogesterone: <10 ng/dL
ANTI-MULLERIAN HORMONE (AMH): 0.136 ng/mL
DHEA-Sulfate, LCMS: 108 ug/dL
Estradiol, Serum, MS: 29 pg/mL
Follicle Stimulating Hormone: 3.4 m[IU]/mL
Free Testosterone, Serum: 2.2 pg/mL
Luteinizing Hormone (LH) ECL: 1.1 m[IU]/mL
Prolactin: 12.7 ng/mL
Sex Hormone Binding Globulin: 25.6 nmol/L
TSH: 2 uU/mL
Testosterone, Serum (Total): 12 ng/dL
Testosterone-% Free: 1.8 %

## 2021-05-29 LAB — COMPREHENSIVE METABOLIC PANEL
ALT: 18 IU/L (ref 0–32)
AST: 13 IU/L (ref 0–40)
Albumin/Globulin Ratio: 1.2 (ref 1.2–2.2)
Albumin: 4.2 g/dL (ref 3.8–4.8)
Alkaline Phosphatase: 96 IU/L (ref 44–121)
BUN/Creatinine Ratio: 10 (ref 9–23)
BUN: 8 mg/dL (ref 6–20)
Bilirubin Total: 0.5 mg/dL (ref 0.0–1.2)
CO2: 24 mmol/L (ref 20–29)
Calcium: 9.5 mg/dL (ref 8.7–10.2)
Chloride: 100 mmol/L (ref 96–106)
Creatinine, Ser: 0.81 mg/dL (ref 0.57–1.00)
Globulin, Total: 3.4 g/dL (ref 1.5–4.5)
Glucose: 144 mg/dL — ABNORMAL HIGH (ref 65–99)
Potassium: 4 mmol/L (ref 3.5–5.2)
Sodium: 137 mmol/L (ref 134–144)
Total Protein: 7.6 g/dL (ref 6.0–8.5)
eGFR: 96 mL/min/{1.73_m2} (ref >59)

## 2021-05-29 LAB — T4, FREE: Free T4: 1.14 ng/dL (ref 0.82–1.77)

## 2021-05-29 LAB — T3, FREE: T3, Free: 2.7 pg/mL (ref 2.0–4.4)

## 2021-05-29 LAB — TSH: TSH: 1.78 u[IU]/mL (ref 0.450–4.500)

## 2021-06-17 ENCOUNTER — Ambulatory Visit: Payer: Self-pay | Admitting: Nurse Practitioner

## 2021-06-29 ENCOUNTER — Ambulatory Visit: Payer: Self-pay | Admitting: Nurse Practitioner

## 2021-06-30 ENCOUNTER — Emergency Department (HOSPITAL_COMMUNITY): Payer: Medicaid Other

## 2021-06-30 ENCOUNTER — Encounter (HOSPITAL_COMMUNITY): Payer: Self-pay | Admitting: *Deleted

## 2021-06-30 ENCOUNTER — Observation Stay (HOSPITAL_COMMUNITY)
Admission: EM | Admit: 2021-06-30 | Discharge: 2021-07-05 | Disposition: A | Discharge status: Home / Self Care | Payer: Medicaid Other | Attending: Internal Medicine | Admitting: Internal Medicine

## 2021-06-30 DIAGNOSIS — D649 Anemia, unspecified: Secondary | ICD-10-CM | POA: Diagnosis not present

## 2021-06-30 DIAGNOSIS — Z8249 Family history of ischemic heart disease and other diseases of the circulatory system: Secondary | ICD-10-CM | POA: Diagnosis not present

## 2021-06-30 DIAGNOSIS — Z7985 Long-term (current) use of injectable non-insulin antidiabetic drugs: Secondary | ICD-10-CM | POA: Diagnosis not present

## 2021-06-30 DIAGNOSIS — K760 Fatty (change of) liver, not elsewhere classified: Secondary | ICD-10-CM | POA: Insufficient documentation

## 2021-06-30 DIAGNOSIS — F419 Anxiety disorder, unspecified: Secondary | ICD-10-CM | POA: Diagnosis not present

## 2021-06-30 DIAGNOSIS — R0602 Shortness of breath: Secondary | ICD-10-CM | POA: Diagnosis not present

## 2021-06-30 DIAGNOSIS — I2510 Atherosclerotic heart disease of native coronary artery without angina pectoris: Secondary | ICD-10-CM | POA: Diagnosis not present

## 2021-06-30 DIAGNOSIS — Z7982 Long term (current) use of aspirin: Secondary | ICD-10-CM | POA: Diagnosis not present

## 2021-06-30 DIAGNOSIS — E119 Type 2 diabetes mellitus without complications: Secondary | ICD-10-CM | POA: Diagnosis not present

## 2021-06-30 DIAGNOSIS — R0789 Other chest pain: Principal | ICD-10-CM | POA: Insufficient documentation

## 2021-06-30 DIAGNOSIS — Z7984 Long term (current) use of oral hypoglycemic drugs: Secondary | ICD-10-CM | POA: Insufficient documentation

## 2021-06-30 DIAGNOSIS — Z79899 Other long term (current) drug therapy: Secondary | ICD-10-CM | POA: Diagnosis not present

## 2021-06-30 DIAGNOSIS — I10 Essential (primary) hypertension: Secondary | ICD-10-CM | POA: Diagnosis not present

## 2021-06-30 DIAGNOSIS — I160 Hypertensive urgency: Secondary | ICD-10-CM | POA: Diagnosis present

## 2021-06-30 DIAGNOSIS — Z20822 Contact with and (suspected) exposure to covid-19: Secondary | ICD-10-CM | POA: Diagnosis not present

## 2021-06-30 DIAGNOSIS — E785 Hyperlipidemia, unspecified: Secondary | ICD-10-CM | POA: Insufficient documentation

## 2021-06-30 DIAGNOSIS — R079 Chest pain, unspecified: Secondary | ICD-10-CM | POA: Diagnosis present

## 2021-06-30 DIAGNOSIS — Z6841 Body Mass Index (BMI) 40.0 and over, adult: Secondary | ICD-10-CM | POA: Insufficient documentation

## 2021-06-30 LAB — I-STAT BETA HCG BLOOD, ED (MC, WL, AP ONLY): I-stat hCG, quantitative: <5 m[IU]/mL (ref <5)

## 2021-06-30 LAB — CBC
HCT: 39.1 % (ref 36.0–46.0)
Hemoglobin: 12.2 g/dL (ref 12.0–15.0)
MCH: 25.7 pg — ABNORMAL LOW (ref 26.0–34.0)
MCHC: 31.2 g/dL (ref 30.0–36.0)
MCV: 82.3 fL (ref 80.0–100.0)
Platelets: 335 10*3/uL (ref 150–400)
RBC: 4.75 MIL/uL (ref 3.87–5.11)
RDW: 14.2 % (ref 11.5–15.5)
WBC: 7.5 10*3/uL (ref 4.0–10.5)
nRBC: 0 % (ref 0.0–0.2)

## 2021-06-30 LAB — BASIC METABOLIC PANEL
Anion gap: 6 (ref 5–15)
BUN: 9 mg/dL (ref 6–20)
CO2: 26 mmol/L (ref 22–32)
Calcium: 9.3 mg/dL (ref 8.9–10.3)
Chloride: 107 mmol/L (ref 98–111)
Creatinine, Ser: 0.65 mg/dL (ref 0.44–1.00)
GFR, Estimated: >60 mL/min (ref >60)
Glucose, Bld: 135 mg/dL — ABNORMAL HIGH (ref 70–99)
Potassium: 4.1 mmol/L (ref 3.5–5.1)
Sodium: 139 mmol/L (ref 135–145)

## 2021-06-30 LAB — TROPONIN I (HIGH SENSITIVITY)
Troponin I (High Sensitivity): 2 ng/L (ref <18)
Troponin I (High Sensitivity): 3 ng/L (ref <18)

## 2021-06-30 LAB — BRAIN NATRIURETIC PEPTIDE: B Natriuretic Peptide: 21.8 pg/mL (ref 0.0–100.0)

## 2021-06-30 MED ORDER — LIDOCAINE 5 % EX PTCH
1.0000 | MEDICATED_PATCH | CUTANEOUS | Status: DC
  Administered 2021-07-01 – 2021-07-04 (×5): 1 via TRANSDERMAL
  Filled 2021-06-30 (×5): qty 1

## 2021-06-30 MED ORDER — FENTANYL CITRATE PF 50 MCG/ML IJ SOSY: 75.0000 ug | PREFILLED_SYRINGE | INTRAMUSCULAR | Status: DC | PRN

## 2021-06-30 MED ORDER — ACETAMINOPHEN 500 MG PO TABS
1000.0000 mg | ORAL_TABLET | Freq: Once | ORAL | Status: AC
  Administered 2021-06-30: 1000 mg via ORAL
  Filled 2021-06-30: qty 2

## 2021-06-30 MED ORDER — FENTANYL CITRATE PF 50 MCG/ML IJ SOSY
75.0000 ug | PREFILLED_SYRINGE | INTRAMUSCULAR | Status: DC | PRN
Start: 2021-06-30 — End: 2021-06-30
  Administered 2021-06-30: 75 ug via INTRAVENOUS
  Filled 2021-06-30: qty 2

## 2021-06-30 MED ORDER — METHOCARBAMOL 500 MG PO TABS: 500.0000 mg | ORAL_TABLET | Freq: Four times a day (QID) | ORAL | Status: DC | PRN

## 2021-06-30 MED ORDER — FENTANYL CITRATE PF 50 MCG/ML IJ SOSY
50.0000 ug | PREFILLED_SYRINGE | Freq: Once | INTRAMUSCULAR | Status: AC
Start: 2021-06-30 — End: 2021-06-30
  Administered 2021-06-30: 50 ug via INTRAVENOUS
  Filled 2021-06-30: qty 1

## 2021-06-30 NOTE — ED Triage Notes (Signed)
Pt complains of chest pain and SOB since yesterday morning.

## 2021-06-30 NOTE — ED Provider Notes (Signed)
[facility] DEPT Provider Note   CSN: [id number] Arrival date & time: 06/30/21  1018     History Chief Complaint  Patient presents with   Chest Pain    Autumn [name] is a 35 y.o. female.  HPI Patient is a 35 year old female with a past medical history significant for morbid obesity, chest pain, hypertensive urgency, HTN, anemia, DM2, anxiety, nonobstructive CAD.   Patient is presenting to the Emergency Department with chest pressure and shortness of breath since yesterday. She reports constant 10/10 chest pressure that is worse with laying flat, taking a deep breath, and activity/movement. She denies exertional pain/increase in chest pressure. She took her daily aspirin with no relief. She denies recent travel, cough, hemoptysis, fever, sick contacts, abdominal pain, nausea or vomiting. 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 She reports orthopnea for 10 years. Denies lower extremity swelling or recent weight gain.       Past Medical History:  Diagnosis Date   Diabetes mellitus, type II ([place])    Essential hypertension    H/O varicella    Headache    Heart murmur    Heel spur, right 01/2020   Hypertension    stopped meds since pregnant   Leaky heart valve    mild MR by echo   Mini stroke    2018    Obese    Prediabetes    Torn Achilles tendon, right, initial encounter 01/2020   Vitamin D deficiency 02/2020    Patient Active Problem List   Diagnosis Date Noted   Weight gain 03/11/2020   Heel spur, unspecified laterality 02/02/2020   Hemoglobin A1c less than 7.0% 08/13/2019   Class 3 severe obesity due to excess calories with serious comorbidity and body mass index (BMI) of 50.0 to 59.9 in adult ([place]) 03/07/2019   Anxiety 12/11/2018   Peripheral edema 12/11/2018   Type 2 diabetes mellitus without complication ([place]) 41/66/0630   Complicated migraine 16/09/930   Hypertensive urgency 05/02/2017    Normocytic anemia 05/02/2017   Left-sided weakness 05/02/2017   Essential hypertension    Myofascial pain 01/13/2016   Weakness 01/13/2016   Pain in limb 01/13/2016   Chest pain at rest 12/14/2015   Preeclampsia complicating hypertension 11/29/2013   Chronic hypertension with superimposed preeclampsia 11/28/2013   Positive GBS test 11/28/2013   Chronic headaches 11/28/2013   Leaky heart valve 05/12/2012   Morbid obesity ([place]) 01/22/2012    Past Surgical History:  Procedure Laterality Date   BILATERAL SALPINGECTOMY Bilateral 11/28/2013   Procedure: BILATERAL SALPINGECTOMY;  Surgeon: Betsy Coder, MD;  Location: [facility];  Service: Obstetrics;  Laterality: Bilateral;  Partial on Left   CESAREAN SECTION  05/14/2012   Procedure: CESAREAN SECTION;  Surgeon: Eldred Manges, MD;  Location: [facility];  Service: Gynecology;  Laterality: N/A;  Primary cesarean section of baby girl    at [place] 8/9   CESAREAN SECTION N/A 11/28/2013   Procedure: CESAREAN SECTION;  Surgeon: Betsy Coder, MD;  Location: [facility];  Service: Obstetrics;  Laterality: N/A;   CESAREAN SECTION       OB History     Gravida  2   Para  2   Term  2   Preterm  0   AB  0   Living  2      SAB  0   IAB  0   Ectopic  0   Multiple  Live Births  2           Family History  Problem Relation Age of Onset   Hypertension Mother    Diabetes Mellitus II Mother    Breast cancer Mother 81   Heart failure Mother    CAD Father    Hyperlipidemia Father    Heart disease Father    Heart attack Father    Hypertension Sister    Diabetes Mellitus II Sister    Diabetes Sister    Thyroid disease Sister    Anemia Sister    Hypertension Sister    Depression Sister    Heart murmur Sister    Heart murmur Daughter    Anesthesia problems Neg Hx     Social History   Tobacco Use   Smoking status: Never   Smokeless tobacco: Never  Vaping Use   Vaping Use: Never used  Substance Use Topics   Alcohol  use: No   Drug use: No    Home Medications Prior to Admission medications   Medication Sig Start Date End Date Taking? Authorizing Provider  acetaminophen (TYLENOL) 500 MG tablet Take 500 mg by mouth every 6 (six) hours as needed for mild pain or headache.    [provider]  ALPRAZolam Duanne Moron) 0.5 MG tablet Take 1 tablet (0.5 mg total) by mouth at bedtime as needed. Patient not taking: Reported on 05/20/2021 06/07/20   Azzie Glatter, FNP  amLODipine (NORVASC) 10 MG tablet Take 1 tablet (10 mg total) by mouth daily. 05/20/21   Bo Merino I, NP  aspirin EC 325 MG tablet Take 325 mg by mouth daily.    [provider]  atorvastatin (LIPITOR) 10 MG tablet Take 1 tablet (10 mg total) by mouth daily. 05/20/21   Passmore, Jake Church I, NP  bisoprolol (ZEBETA) 5 MG tablet Take 1 tablet (5 mg total) by mouth daily. 09/02/18   Azzie Glatter, FNP  blood glucose meter kit and supplies Dispense based on patient and insurance preference. Use up to four times daily as directed. (FOR ICD-9 250.00, 250.01). 05/03/17   Domenic Polite, MD  busPIRone (BUSPAR) 10 MG tablet Take 1 tablet (10 mg total) by mouth 2 (two) times daily. 11/13/18   Azzie Glatter, FNP  Dulaglutide (TRULICITY) 9.03 OB/4.9PU SOPN Inject 0.75 mg into the skin once a week. 05/20/21   Bo Merino I, NP  ferrous sulfate (FERROUSUL) 325 (65 FE) MG tablet Take 1 tablet (325 mg total) by mouth daily with breakfast. Patient not taking: Reported on 05/20/2021 05/03/17   Domenic Polite, MD  furosemide (LASIX) 20 MG tablet Take 1 tablet (20 mg total) by mouth daily. 05/20/21   Passmore, Jake Church I, NP  hydrochlorothiazide (HYDRODIURIL) 25 MG tablet Take 1 tablet (25 mg total) by mouth daily. 05/20/21   Passmore, Jake Church I, NP  losartan (COZAAR) 25 MG tablet Take 1 tablet (25 mg total) by mouth daily. 05/20/21   Bo Merino I, NP  metFORMIN (GLUCOPHAGE-XR) 750 MG 24 hr tablet Take 1 tablet (750 mg total) by mouth 2 (two) times  daily. 05/20/21   Passmore, Jake Church I, NP  naproxen (NAPROSYN) 500 MG tablet Take 1 tablet (500 mg total) by mouth 2 (two) times daily. 05/20/21   Bo Merino I, NP    Allergies    Patient has no known allergies.  Review of Systems   Review of Systems  Constitutional:  Negative for chills and fever.  HENT:  Negative for congestion.   Eyes:  Negative for pain.  Respiratory:  Positive for shortness of breath. Negative for cough.   Cardiovascular:  Positive for chest pain. Negative for leg swelling.  Gastrointestinal:  Negative for abdominal pain, diarrhea, nausea and vomiting.  Genitourinary:  Negative for dysuria.  Musculoskeletal:  Negative for myalgias.  Skin:  Negative for rash.  Neurological:  Negative for dizziness and headaches.   Physical Exam Updated Vital Signs BP (!) 174/95   Pulse 70   Temp 98.2 F (36.8 C) (Oral)   Resp (!) 28   Ht _0  (1.676 m)   Wt (!) 145.2 kg   LMP 06/26/2021   SpO2 99%   BMI 51.65 kg/m   Physical Exam Vitals and nursing note reviewed.  Constitutional:      General: She is in acute distress.     Appearance: She is obese.  HENT:     Head: Normocephalic and atraumatic.     Nose: Nose normal.  Eyes:     General: No scleral icterus. Cardiovascular:     Rate and Rhythm: Normal rate and regular rhythm.     Pulses: Normal pulses.     Heart sounds: Normal heart sounds.  Pulmonary:     Effort: Pulmonary effort is normal. No respiratory distress.     Breath sounds: No wheezing.     Comments: Clear to auscultation all fields Chest:     Chest wall: No tenderness.  Abdominal:     Palpations: Abdomen is soft.     Tenderness: There is no abdominal tenderness. There is no guarding or rebound.  Musculoskeletal:     Cervical back: Normal range of motion.     Right lower leg: No edema.     Left lower leg: No edema.     Comments: No significant lower extremity edema no calf tenderness.  No swelling or bruising.  Skin:    General: Skin is  warm and dry.     Capillary Refill: Capillary refill takes less than 2 seconds.  Neurological:     Mental Status: She is alert. Mental status is at baseline.  Psychiatric:        Mood and Affect: Mood normal.        Behavior: Behavior normal.    ED Results / Procedures / Treatments   Labs (all labs ordered are listed, but only abnormal results are displayed) Labs Reviewed  BASIC METABOLIC PANEL - Abnormal; Notable for the following components:      Result Value   Glucose, Bld 135 (*)    All other components within normal limits  CBC - Abnormal; Notable for the following components:   MCH 25.7 (*)    All other components within normal limits  RESP PANEL BY RT-PCR (FLU A&B, COVID) ARPGX2  BRAIN NATRIURETIC PEPTIDE  I-STAT BETA HCG BLOOD, ED ([facility], [facility], [facility] ONLY)  TROPONIN I (HIGH SENSITIVITY)  TROPONIN I (HIGH SENSITIVITY)    EKG EKG Interpretation  Date/Time:  Thursday June 30 2021 10:32:44 EDT Ventricular Rate:  83 PR Interval:  140 QRS Duration: 74 QT Interval:  366 QTC Calculation: 430 R Axis:   43 Text Interpretation: Normal sinus rhythm Normal ECG No significant change since prior 2016 Confirmed by Aletta Edouard [phone]) on 06/30/2021 10:35:22 AM  Radiology DG Chest 2 View  Result Date: 06/30/2021 CLINICAL DATA:  Chest pain and shortness of breath x1 day. EXAM: CHEST - 2 VIEW COMPARISON:  Chest radiograph 01/14/2019 FINDINGS: The heart size and mediastinal contours are within normal limits. Both lungs  are clear. No pleural effusion or pneumothorax. The visualized skeletal structures are unremarkable. IMPRESSION: No active cardiopulmonary disease. Electronically Signed   By: Ileana Roup M.D.   On: 06/30/2021 11:14    Procedures Procedures   Medications Ordered in ED Medications  lidocaine (LIDODERM) 5 % 1 patch (has no administration in time range)  methocarbamol (ROBAXIN) tablet 500 mg (has no administration in time range)  acetaminophen (TYLENOL) tablet 1,000  mg (1,000 mg Oral Given 06/30/21 2054)  fentaNYL (SUBLIMAZE) injection 50 mcg (50 mcg Intravenous Given 06/30/21 2338)    ED Course  I have reviewed the triage vital signs and the nursing notes.  Pertinent labs & imaging results that were available during my care of the patient were reviewed by me and considered in my medical decision making (see chart for details).  Clinical Course as of 07/01/21 0031  Thu Jun 30, 2021  2348 Echo in AM Admit to medicine for tele If echo reassuring and pain improved likely DC home if needed cards available for consult by hospitalist.  Dr. Humphrey Rolls [WF]    Clinical Course User Index [WF] Tedd Sias, [place]   MDM Rules/Calculators/A&P                          Patient is a 35 year old female with multiple risk factors for CAD including known CAD to the catheterization done years ago, poorly treated hypertension, DM2, obesity, family history  She has had chest pain since 7 AM yesterday.  Symptoms of been constant since and associate shortness of breath worse with movements but not necessarily exertional nor better with rest.  Does not seem to be particularly positional.  She denies any pleuritic chest pain no hemoptysis not a cancer patient have relatively low suspicion for PE.  Sats have been 100% practically the entire time she has been here.  Lungs are clear to auscultation.  Lower extremities are without any swelling/are symmetrically consistent with obesity  Troponin x2 within normal limits.  I-STAT hCG negative pregnancy.  COVID test pending at this time.  BNP within norm limits.  BMP unremarkable CBC that leukocytosis or anemia chest x-ray unremarkable  EKG nonischemic  I discussed this case with my attending physician who cosigned this note including patient's presenting symptoms, physical exam, and planned diagnostics and interventions. Attending physician stated agreement with plan or made changes to plan which were implemented.   Recommended  cardiology discussion.  Discussed with Dr. Yancey Flemings and cardiology recommended echocardiogram in the morning and admit to medicine overnight.  They will be reconsulted if needed by hospitalist.  Discussed with Dr. Myna Hidalgo who will admit.  Final Clinical Impression(s) / ED Diagnoses Final diagnoses:  Nonspecific chest pain    Rx / DC Orders ED Discharge Orders     None        Tedd Sias, [place] 07/01/21 Heriberto Antigua    Fredia Sorrow, MD 07/05/21 [phone]

## 2021-06-30 NOTE — ED Notes (Signed)
Notified provider of pt's elevated BP.  

## 2021-06-30 NOTE — ED Notes (Signed)
Notified provider of continued BP elevation and pt's reported chest pain, SOB, and headache. She is currently resting in the stretcher in NAD.

## 2021-06-30 NOTE — ED Provider Notes (Signed)
Emergency Medicine Provider Triage Evaluation Note  Florina Glas , a 35 y.o. female  was evaluated in triage.  Pt complains of diffuse chest tightness, sharp pain, onset yesterday upon waking. Worse with taking a deep breath and movement. History of HTN and DM. Father with MI in his 8s.  Review of Systems  Positive: CP, SHOB Negative: Nausea, vomiting, diaphoresis   Physical Exam  BP (!) 175/104 (BP Location: Left Arm)   Pulse 81   Temp 98.9 F (37.2 C) (Oral)   Resp 20   Ht 5\' 6"  (1.676 m)   Wt (!) 145.2 kg   LMP 06/26/2021   SpO2 100%   BMI 51.65 kg/m  Gen:   Awake, no distress   Resp:  Normal effort  MSK:   Moves extremities without difficulty  Other:  No chest wall tenderness   Medical Decision Making  Medically screening exam initiated at 11:23 AM.  Appropriate orders placed.  08/26/2021 was informed that the remainder of the evaluation will be completed by another provider, this initial triage assessment does not replace that evaluation, and the importance of remaining in the ED until their evaluation is complete.     [name], PA-C 06/30/21 1124    08/30/21, MD 06/30/21 [phone]

## 2021-07-01 ENCOUNTER — Encounter (HOSPITAL_COMMUNITY): Payer: Self-pay | Admitting: Family Medicine

## 2021-07-01 ENCOUNTER — Observation Stay (HOSPITAL_BASED_OUTPATIENT_CLINIC_OR_DEPARTMENT_OTHER): Payer: Medicaid Other

## 2021-07-01 ENCOUNTER — Observation Stay (HOSPITAL_COMMUNITY): Payer: Medicaid Other

## 2021-07-01 DIAGNOSIS — I160 Hypertensive urgency: Secondary | ICD-10-CM | POA: Diagnosis not present

## 2021-07-01 DIAGNOSIS — I2510 Atherosclerotic heart disease of native coronary artery without angina pectoris: Secondary | ICD-10-CM | POA: Diagnosis not present

## 2021-07-01 DIAGNOSIS — E119 Type 2 diabetes mellitus without complications: Secondary | ICD-10-CM | POA: Diagnosis not present

## 2021-07-01 DIAGNOSIS — F419 Anxiety disorder, unspecified: Secondary | ICD-10-CM | POA: Diagnosis not present

## 2021-07-01 DIAGNOSIS — R079 Chest pain, unspecified: Secondary | ICD-10-CM | POA: Diagnosis not present

## 2021-07-01 LAB — CBG MONITORING, ED
Glucose-Capillary: 133 mg/dL — ABNORMAL HIGH (ref 70–99)
Glucose-Capillary: 123 mg/dL — ABNORMAL HIGH (ref 70–99)
Glucose-Capillary: 130 mg/dL — ABNORMAL HIGH (ref 70–99)

## 2021-07-01 LAB — RESP PANEL BY RT-PCR (FLU A&B, COVID) ARPGX2
Influenza A by PCR: NEGATIVE
Influenza B by PCR: NEGATIVE
SARS Coronavirus 2 by RT PCR: NEGATIVE

## 2021-07-01 LAB — ECHOCARDIOGRAM COMPLETE
AR max vel: 1.22 cm2
AV Area VTI: 1.3 cm2
AV Area mean vel: 1.23 cm2
AV Mean grad: 4 mmHg
AV Peak grad: 9.1 mmHg
Ao pk vel: 1.51 m/s
Area-P 1/2: 4.39 cm2
Height: 66 in
S' Lateral: 3.6 cm
Weight: 5120 oz

## 2021-07-01 LAB — URINALYSIS, ROUTINE W REFLEX MICROSCOPIC
Bilirubin Urine: NEGATIVE
Glucose, UA: NEGATIVE mg/dL
Ketones, ur: NEGATIVE mg/dL
Leukocytes,Ua: NEGATIVE
Nitrite: NEGATIVE
Protein, ur: 30 mg/dL — AB
Specific Gravity, Urine: 1.02 (ref 1.005–1.030)
pH: 5 (ref 5.0–8.0)

## 2021-07-01 LAB — LIPID PANEL
Cholesterol: 165 mg/dL (ref 0–200)
HDL: 47 mg/dL (ref >40)
LDL Cholesterol: 104 mg/dL — ABNORMAL HIGH (ref 0–99)
Total CHOL/HDL Ratio: 3.5 RATIO
Triglycerides: 69 mg/dL (ref <150)
VLDL: 14 mg/dL (ref 0–40)

## 2021-07-01 LAB — HIV ANTIBODY (ROUTINE TESTING W REFLEX): HIV Screen 4th Generation wRfx: NONREACTIVE

## 2021-07-01 LAB — D-DIMER, QUANTITATIVE: D-Dimer, Quant: 0.31 ug/mL-FEU (ref 0.00–0.50)

## 2021-07-01 LAB — C-REACTIVE PROTEIN: CRP: 1 mg/dL — ABNORMAL HIGH (ref <1.0)

## 2021-07-01 LAB — SEDIMENTATION RATE: Sed Rate: 18 mm/hr (ref 0–22)

## 2021-07-01 MED ORDER — ENOXAPARIN SODIUM 40 MG/0.4ML IJ SOSY
40.0000 mg | PREFILLED_SYRINGE | INTRAMUSCULAR | Status: DC
  Administered 2021-07-01 – 2021-07-05 (×5): 40 mg via SUBCUTANEOUS
  Filled 2021-07-01 (×5): qty 0.4

## 2021-07-01 MED ORDER — BISOPROLOL FUMARATE 5 MG PO TABS
5.0000 mg | ORAL_TABLET | Freq: Every day | ORAL | Status: DC
  Administered 2021-07-01 – 2021-07-05 (×5): 5 mg via ORAL
  Filled 2021-07-01 (×6): qty 1

## 2021-07-01 MED ORDER — ASPIRIN EC 81 MG PO TBEC
81.0000 mg | DELAYED_RELEASE_TABLET | Freq: Every day | ORAL | Status: DC
  Administered 2021-07-01 – 2021-07-05 (×5): 81 mg via ORAL
  Filled 2021-07-01 (×5): qty 1

## 2021-07-01 MED ORDER — PERFLUTREN LIPID MICROSPHERE
1.0000 mL | INTRAVENOUS | Status: AC | PRN
  Administered 2021-07-01: 2 mL via INTRAVENOUS
  Filled 2021-07-01: qty 10

## 2021-07-01 MED ORDER — ACETAMINOPHEN 325 MG PO TABS
650.0000 mg | ORAL_TABLET | ORAL | Status: DC | PRN
Start: 2021-07-01 — End: 2021-07-05
  Administered 2021-07-01 – 2021-07-04 (×7): 650 mg via ORAL
  Filled 2021-07-01 (×7): qty 2

## 2021-07-01 MED ORDER — IOHEXOL 350 MG/ML SOLN
100.0000 mL | Freq: Once | INTRAVENOUS | Status: AC | PRN
  Administered 2021-07-01: 100 mL via INTRAVENOUS

## 2021-07-01 MED ORDER — ONDANSETRON HCL 4 MG/2ML IJ SOLN: 4.0000 mg | Freq: Four times a day (QID) | INTRAMUSCULAR | Status: DC | PRN

## 2021-07-01 MED ORDER — HYDROCHLOROTHIAZIDE 25 MG PO TABS
25.0000 mg | ORAL_TABLET | Freq: Every day | ORAL | Status: DC
  Administered 2021-07-01 – 2021-07-05 (×5): 25 mg via ORAL
  Filled 2021-07-01 (×5): qty 1

## 2021-07-01 MED ORDER — BISOPROLOL FUMARATE 5 MG PO TABS: 5.0000 mg | ORAL_TABLET | Freq: Every day | ORAL | Status: DC

## 2021-07-01 MED ORDER — INSULIN ASPART 100 UNIT/ML IJ SOLN
0.0000 [IU] | Freq: Every day | INTRAMUSCULAR | Status: DC
  Filled 2021-07-01: qty 0.05

## 2021-07-01 MED ORDER — PANTOPRAZOLE SODIUM 40 MG PO TBEC
40.0000 mg | DELAYED_RELEASE_TABLET | Freq: Every day | ORAL | Status: DC
  Administered 2021-07-01 – 2021-07-05 (×5): 40 mg via ORAL
  Filled 2021-07-01 (×5): qty 1

## 2021-07-01 MED ORDER — NITROGLYCERIN 0.4 MG SL SUBL
0.4000 mg | SUBLINGUAL_TABLET | SUBLINGUAL | Status: DC | PRN
  Administered 2021-07-01 – 2021-07-04 (×7): 0.4 mg via SUBLINGUAL
  Filled 2021-07-01 (×8): qty 1

## 2021-07-01 MED ORDER — BUSPIRONE HCL 5 MG PO TABS
10.0000 mg | ORAL_TABLET | Freq: Two times a day (BID) | ORAL | Status: DC
  Administered 2021-07-01 – 2021-07-05 (×8): 10 mg via ORAL
  Filled 2021-07-01 (×7): qty 2
  Filled 2021-07-01: qty 1

## 2021-07-01 MED ORDER — INSULIN ASPART 100 UNIT/ML IJ SOLN
0.0000 [IU] | Freq: Three times a day (TID) | INTRAMUSCULAR | Status: DC
  Administered 2021-07-01 – 2021-07-02 (×5): 1 [IU] via SUBCUTANEOUS
  Administered 2021-07-03: 2 [IU] via SUBCUTANEOUS
  Administered 2021-07-03: 1 [IU] via SUBCUTANEOUS
  Administered 2021-07-03 – 2021-07-04 (×2): 2 [IU] via SUBCUTANEOUS
  Administered 2021-07-04: 1 [IU] via SUBCUTANEOUS
  Administered 2021-07-04: 2 [IU] via SUBCUTANEOUS
  Administered 2021-07-05: 5 [IU] via SUBCUTANEOUS
  Administered 2021-07-05: 2 [IU] via SUBCUTANEOUS
  Filled 2021-07-01: qty 0.09

## 2021-07-01 MED ORDER — ATORVASTATIN CALCIUM 10 MG PO TABS
10.0000 mg | ORAL_TABLET | Freq: Every day | ORAL | Status: DC
  Administered 2021-07-01 – 2021-07-05 (×5): 10 mg via ORAL
  Filled 2021-07-01 (×6): qty 1

## 2021-07-01 MED ORDER — ALPRAZOLAM 0.5 MG PO TABS
0.5000 mg | ORAL_TABLET | Freq: Every evening | ORAL | Status: DC | PRN
  Filled 2021-07-01: qty 1

## 2021-07-01 MED ORDER — LOSARTAN POTASSIUM 25 MG PO TABS
25.0000 mg | ORAL_TABLET | Freq: Every day | ORAL | Status: DC
  Administered 2021-07-01 – 2021-07-05 (×5): 25 mg via ORAL
  Filled 2021-07-01 (×5): qty 1

## 2021-07-01 MED ORDER — AMLODIPINE BESYLATE 10 MG PO TABS
10.0000 mg | ORAL_TABLET | Freq: Every day | ORAL | Status: DC
  Administered 2021-07-01 – 2021-07-05 (×5): 10 mg via ORAL
  Filled 2021-07-01 (×3): qty 1
  Filled 2021-07-01: qty 2
  Filled 2021-07-01 (×2): qty 1

## 2021-07-01 NOTE — H&P (Signed)
History and Physical    Autumn [name] EHM:[id number] DOB: 09/11/1986 DOA: 06/30/2021  PCP: Bo Merino I, NP   Patient coming from: Home   Chief Complaint: Chest pain   HPI: Autumn [name] is a 35 y.o. female with medical history significant for resistant hypertension, type 2 diabetes mellitus, dyslipidemia, BMI 99, and reported mild nonobstructive CAD on cath in 2012, now presenting to the emergency department for evaluation of chest pain.  Patient reports that she developed severe pain in the central chest the morning of 06/29/2021 and has been experiencing constant, sharp pain that is worse when laying down, somewhat better sitting up, and also worse with certain movements and deep inspiration.  There is mild shortness of breath associated with this but no cough.  She denies any recent leg swelling or tenderness.  Pain is similar to what she has experienced in the past but more severe today.  She denies any recent illness, fever, or chills.  ED Course: Upon arrival to the ED, patient is found to be afebrile, hypertensive to 190/100, and with normal heart rate and oxygen saturations.  EKG features a sinus rhythm and chest x-ray negative for acute cardiopulmonary disease.  Chemistry panel and CBC are unremarkable.  High-sensitivity troponin is normal x2.  BNP is normal.  D-dimer normal.  Patient was treated with acetaminophen, fentanyl, Lidoderm, and Robaxin in the ED.  She continued to complain of chest pain, ED PA discussed the case with cardiology and it was recommended that the patient be observed on the medicine service and assessed with echocardiogram.  Review of Systems:  All other systems reviewed and apart from HPI, are negative.  Past Medical History:  Diagnosis Date   Diabetes mellitus, type II ([place])    Essential hypertension    H/O varicella    Headache    Heart murmur    Heel spur, right 01/2020   Hypertension    stopped meds since pregnant   Leaky heart  valve    mild MR by echo   Mini stroke    2018    Obese    Prediabetes    Torn Achilles tendon, right, initial encounter 01/2020   Vitamin D deficiency 02/2020    Past Surgical History:  Procedure Laterality Date   BILATERAL SALPINGECTOMY Bilateral 11/28/2013   Procedure: BILATERAL SALPINGECTOMY;  Surgeon: Betsy Coder, MD;  Location: [facility];  Service: Obstetrics;  Laterality: Bilateral;  Partial on Left   CESAREAN SECTION  05/14/2012   Procedure: CESAREAN SECTION;  Surgeon: Eldred Manges, MD;  Location: [facility];  Service: Gynecology;  Laterality: N/A;  Primary cesarean section of baby girl    at [place] 8/9   CESAREAN SECTION N/A 11/28/2013   Procedure: CESAREAN SECTION;  Surgeon: Betsy Coder, MD;  Location: [facility];  Service: Obstetrics;  Laterality: N/A;   CESAREAN SECTION      Social History:   reports that she has never smoked. She has never used smokeless tobacco. She reports that she does not drink alcohol and does not use drugs.  No Known Allergies  Family History  Problem Relation Age of Onset   Hypertension Mother    Diabetes Mellitus II Mother    Breast cancer Mother 53   Heart failure Mother    CAD Father    Hyperlipidemia Father    Heart disease Father    Heart attack Father    Hypertension Sister    Diabetes Mellitus II Sister  Diabetes Sister    Thyroid disease Sister    Anemia Sister    Hypertension Sister    Depression Sister    Heart murmur Sister    Heart murmur Daughter    Anesthesia problems Neg Hx      Prior to Admission medications   Medication Sig Start Date End Date Taking? Authorizing Provider  acetaminophen (TYLENOL) 500 MG tablet Take 500 mg by mouth every 6 (six) hours as needed for mild pain or headache.   Yes [provider]  ALPRAZolam (XANAX) 0.5 MG tablet Take 1 tablet (0.5 mg total) by mouth at bedtime as needed. 06/07/20  Yes Azzie Glatter, FNP  amLODipine (NORVASC) 10 MG tablet Take 1 tablet (10 mg total)  by mouth daily. 05/20/21  Yes Passmore, Jake Church I, NP  aspirin EC 325 MG tablet Take 325 mg by mouth daily.   Yes [provider]  atorvastatin (LIPITOR) 10 MG tablet Take 1 tablet (10 mg total) by mouth daily. 05/20/21  Yes Passmore, Jake Church I, NP  bisoprolol (ZEBETA) 5 MG tablet Take 1 tablet (5 mg total) by mouth daily. 09/02/18  Yes Azzie Glatter, FNP  blood glucose meter kit and supplies Dispense based on patient and insurance preference. Use up to four times daily as directed. (FOR ICD-9 250.00, 250.01). 05/03/17  Yes Domenic Polite, MD  busPIRone (BUSPAR) 10 MG tablet Take 1 tablet (10 mg total) by mouth 2 (two) times daily. 11/13/18  Yes Azzie Glatter, FNP  Dulaglutide (TRULICITY) 0.10 OF/1.2RF SOPN Inject 0.75 mg into the skin once a week. Patient taking differently: Inject 0.75 mg into the skin once a week. Mondays 05/20/21  Yes Passmore, Jake Church I, NP  furosemide (LASIX) 20 MG tablet Take 1 tablet (20 mg total) by mouth daily. Patient taking differently: Take 20 mg by mouth daily as needed for fluid or edema. 05/20/21  Yes Passmore, Jake Church I, NP  hydrochlorothiazide (HYDRODIURIL) 25 MG tablet Take 1 tablet (25 mg total) by mouth daily. Patient taking differently: Take 25 mg by mouth daily as needed. 05/20/21  Yes Passmore, Jake Church I, NP  losartan (COZAAR) 25 MG tablet Take 1 tablet (25 mg total) by mouth daily. 05/20/21  Yes Passmore, Jake Church I, NP  metFORMIN (GLUCOPHAGE-XR) 750 MG 24 hr tablet Take 1 tablet (750 mg total) by mouth 2 (two) times daily. 05/20/21  Yes Passmore, Jake Church I, NP  naproxen (NAPROSYN) 500 MG tablet Take 1 tablet (500 mg total) by mouth 2 (two) times daily. 05/20/21  Yes Passmore, Jake Church I, NP  ferrous sulfate (FERROUSUL) 325 (65 FE) MG tablet Take 1 tablet (325 mg total) by mouth daily with breakfast. Patient not taking: No sig reported 05/03/17   Domenic Polite, MD    Physical Exam: Vitals:   07/01/21 0200 07/01/21 0219 07/01/21 0230 07/01/21 0256  BP: (!)  179/88 (!) 175/94 (!) 168/93 (!) 169/99  Pulse: (!) 59 70 69 62  Resp:  20    Temp:      TempSrc:      SpO2: 98% 97% 98% 96%  Weight:      Height:        Constitutional: NAD. No diaphoresis or pallor.   Eyes: PERTLA, lids and conjunctivae normal ENMT: Mucous membranes are moist. Posterior pharynx clear of any exudate or lesions.   Neck:  supple, no masses  Respiratory:  no wheezing, no crackles. No accessory muscle use.  Cardiovascular: S1 & S2 heard, regular rate and rhythm. No extremity edema.  Abdomen: No distension, no tenderness, soft. Bowel sounds active.  Musculoskeletal: no clubbing / cyanosis. No joint deformity upper and lower extremities.   Skin: no significant rashes, lesions, ulcers. Warm, dry, well-perfused. Neurologic: CN 2-12 grossly intact. Moving all extremities. Alert, oriented.  Psychiatric: Calm. Cooperative.    Labs and Imaging on Admission: I have personally reviewed following labs and imaging studies  CBC: Recent Labs  Lab 06/30/21 1212  WBC 7.5  HGB 12.2  HCT 39.1  MCV 82.3  PLT 629   Basic Metabolic Panel: Recent Labs  Lab 06/30/21 1212  NA 139  K 4.1  CL 107  CO2 26  GLUCOSE 135*  BUN 9  CREATININE 0.65  CALCIUM 9.3   GFR: Estimated Creatinine Clearance: 142.4 mL/min (by C-G formula based on SCr of 0.65 mg/dL). Liver Function Tests: No results for input(s): AST, ALT, ALKPHOS, BILITOT, PROT, ALBUMIN in the last 168 hours. No results for input(s): LIPASE, AMYLASE in the last 168 hours. No results for input(s): AMMONIA in the last 168 hours. Coagulation Profile: No results for input(s): INR, PROTIME in the last 168 hours. Cardiac Enzymes: No results for input(s): CKTOTAL, CKMB, CKMBINDEX, TROPONINI in the last 168 hours. BNP (last 3 results) No results for input(s): PROBNP in the last 8760 hours. HbA1C: No results for input(s): HGBA1C in the last 72 hours. CBG: No results for input(s): GLUCAP in the last 168 hours. Lipid  Profile: No results for input(s): CHOL, HDL, LDLCALC, TRIG, CHOLHDL, LDLDIRECT in the last 72 hours. Thyroid Function Tests: No results for input(s): TSH, T4TOTAL, FREET4, T3FREE, THYROIDAB in the last 72 hours. Anemia Panel: No results for input(s): VITAMINB12, FOLATE, FERRITIN, TIBC, IRON, RETICCTPCT in the last 72 hours. Urine analysis:    Component Value Date/Time   COLORURINE YELLOW 07/01/2021 0306   APPEARANCEUR CLOUDY (A) 07/01/2021 0306   LABSPEC 1.020 07/01/2021 0306   PHURINE 5.0 07/01/2021 0306   GLUCOSEU NEGATIVE 07/01/2021 0306   HGBUR MODERATE (A) 07/01/2021 0306   BILIRUBINUR NEGATIVE 07/01/2021 0306   BILIRUBINUR negative 03/10/2021 1002   BILIRUBINUR neg 06/07/2020 1159   KETONESUR NEGATIVE 07/01/2021 0306   PROTEINUR 30 (A) 07/01/2021 0306   UROBILINOGEN 0.2 03/10/2021 1002   UROBILINOGEN 0.2 05/09/2017 0949   NITRITE NEGATIVE 07/01/2021 0306   LEUKOCYTESUR NEGATIVE 07/01/2021 0306   Sepsis Labs: @LABRCNTIP (procalcitonin:4,lacticidven:4) ) Recent Results (from the past 240 hour(s))  Resp Panel by RT-PCR (Flu A&B, Covid) Nasopharyngeal Swab     Status: None   Collection Time: 07/01/21 12:45 AM   Specimen: Nasopharyngeal Swab; Nasopharyngeal(NP) swabs in vial transport medium  Result Value Ref Range Status   SARS Coronavirus 2 by RT PCR NEGATIVE NEGATIVE Final    Comment: (NOTE) SARS-CoV-2 target nucleic acids are NOT DETECTED.  The SARS-CoV-2 RNA is generally detectable in upper respiratory specimens during the acute phase of infection. The lowest concentration of SARS-CoV-2 viral copies this assay can detect is 138 copies/mL. A negative result does not preclude SARS-Cov-2 infection and should not be used as the sole basis for treatment or other patient management decisions. A negative result may occur with  improper specimen collection/handling, submission of specimen other than nasopharyngeal swab, presence of viral mutation(s) within the areas targeted  by this assay, and inadequate number of viral copies(<138 copies/mL). A negative result must be combined with clinical observations, patient history, and epidemiological information. The expected result is Negative.  Fact Sheet for Patients:  EntrepreneurPulse.com.au  Fact Sheet for Healthcare Providers:  IncredibleEmployment.be  This test is  no t yet approved or cleared by the [place] and  has been authorized for detection and/or diagnosis of SARS-CoV-2 by FDA under an Emergency Use Authorization (EUA). This EUA will remain  in effect (meaning this test can be used) for the duration of the COVID-19 declaration under Section 564(b)(1) of the Act, 21 U.S.C.section 360bbb-3(b)(1), unless the authorization is terminated  or revoked sooner.       Influenza A by PCR NEGATIVE NEGATIVE Final   Influenza B by PCR NEGATIVE NEGATIVE Final    Comment: (NOTE) The Xpert Xpress SARS-CoV-2/FLU/RSV plus assay is intended as an aid in the diagnosis of influenza from Nasopharyngeal swab specimens and should not be used as a sole basis for treatment. Nasal washings and aspirates are unacceptable for Xpert Xpress SARS-CoV-2/FLU/RSV testing.  Fact Sheet for Patients: EntrepreneurPulse.com.au  Fact Sheet for Healthcare Providers: IncredibleEmployment.be  This test is not yet approved or cleared by the [place] FDA and has been authorized for detection and/or diagnosis of SARS-CoV-2 by FDA under an Emergency Use Authorization (EUA). This EUA will remain in effect (meaning this test can be used) for the duration of the COVID-19 declaration under Section 564(b)(1) of the Act, 21 U.S.C. section 360bbb-3(b)(1), unless the authorization is terminated or revoked.  Performed at [facility], [place] [address], [place], [place] [address]      Radiological Exams on Admission: DG Chest 2  View  Result Date: 06/30/2021 CLINICAL DATA:  Chest pain and shortness of breath x1 day. EXAM: CHEST - 2 VIEW COMPARISON:  Chest radiograph 01/14/2019 FINDINGS: The heart size and mediastinal contours are within normal limits. Both lungs are clear. No pleural effusion or pneumothorax. The visualized skeletal structures are unremarkable. IMPRESSION: No active cardiopulmonary disease. Electronically Signed   By: Ileana Roup M.D.   On: 06/30/2021 11:14    EKG: Independently reviewed. Sinus rhythm.   Assessment/Plan   1. Chest pain  - Patient with hx of chest pain suspected to be non-cardiac and mild non-obstructive CAD on cath in 2012 presents with 1 day of constant central chest pain worse with laying down and certain movements   - Hypertensive in ED with troponin normal x2, d-dimer normal, no acute ischemic features on EKG, and no acute findings on CXR   - ED discussed with cardiology who recommended overnight observation, echo in am, and formal cardiology consultation if needed   - Trial NTG, control BP, check echocardiogram, check ESR and CRP   2. Resistant hypertension  - Hypertensive in ED  - Trial NTG as above, continue Norvasc, HCTZ, bisoprolol, losartan    3. Type II DM  - A1c was 6.7% in August 2022  - Use low-intensity SSI if needed for now    4. Anxiety  - Continue Buspar and Xanax   5. Dyslipidemia  - Continue Lipitor    DVT prophylaxis: Lovenox  Code Status: Full  Level of Care: Level of care: Telemetry Family Communication: Mother updated at bedside with patient's permission  Disposition Plan:  Patient is from: home  Anticipated d/c is to: home  Anticipated d/c date is: 07/02/21 Patient currently: Pending echocardiogram  Consults called: none  Admission status: Observation     Vianne Bulls, MD [facility]  07/01/2021, 4:55 AM

## 2021-07-01 NOTE — Progress Notes (Signed)
2D echocardiogram with [facility] completed.  07/01/2021 1:11 PM Eula Fried., MHA, RVT, RDCS, RDMS

## 2021-07-01 NOTE — ED Notes (Signed)
Echo at bedside

## 2021-07-01 NOTE — Progress Notes (Signed)
1900- Pt sitting in reclining chair in nad. Pt is alert and oriented x4. Denies cp, discomfort at this time. #20g IV in RAC leaking. #22g IV in LAC leaking. Ivs removed with cath intact. Pt oriented to room and surroundings. Telemetry monitoring set up. Call bell within reach.  2000- #22g IV started in right hand x1 attempt. No complaints voiced at this time. Pt's sister present in room. Call bell within reach.

## 2021-07-01 NOTE — Progress Notes (Signed)
Subjective: Patient admitted this morning, see detailed H&P by Dr Antionette Char 35 year old female with history of resistant hypertension, diabetes mellitus type 2, dyslipidemia, BMI 52, reported mild nonobstructive CAD on cath in 2012 presented to ED for evaluation of chest pain.  The chest pain is typical with pressure on middle of chest.  She also was in hypertensive urgency when she presented to hospital with SBP in 195.  Troponin x2 is negative.  Echocardiogram has been ordered.  Vitals:   07/01/21 1530 07/01/21 1535  BP: (!) 150/77 139/74  Pulse: 65 71  Resp: 20 20  Temp: 98.2 F (36.8 C)   SpO2: 99% 98%      A/P  Chest pain -Patient is presenting with features of typical chest pain -Still has 4/10 intensity central chest pain with feeling like pressure; troponin x2 are negative -EKG is unremarkable -She has significant risk factors including morbid obesity, diabetes mellitus, uncontrolled hypertension -CTA chest was done today ruled out aortic dissection however showed calcification in the coronary arteries -She might need stress test versus cardiac cath; will consult cardiology for further recommendations  Resistant hypertension -Patient was hypertensive in the ED -Blood pressure is better controlled -Continue Cozaar, hydrochlorothiazide, Zebeta, amlodipine  Diabetes mellitus type 2 -Continue sliding scale insulin with NovoLog -CBG well controlled  Anxiety -Continue BuSpar, Xanax  Dyslipidemia -Continue Lipitor  Meredeth Ide [facility] [phone]

## 2021-07-02 ENCOUNTER — Other Ambulatory Visit: Payer: Self-pay

## 2021-07-02 DIAGNOSIS — R0789 Other chest pain: Secondary | ICD-10-CM | POA: Diagnosis not present

## 2021-07-02 LAB — GLUCOSE, CAPILLARY
Glucose-Capillary: 141 mg/dL — ABNORMAL HIGH (ref 70–99)
Glucose-Capillary: 122 mg/dL — ABNORMAL HIGH (ref 70–99)
Glucose-Capillary: 103 mg/dL — ABNORMAL HIGH (ref 70–99)
Glucose-Capillary: 148 mg/dL — ABNORMAL HIGH (ref 70–99)

## 2021-07-02 NOTE — Discharge Summary (Signed)
Physician Discharge Summary  Autumn [name] TOI:[id number] DOB: 01-10-1986 DOA: 06/30/2021  PCP: Bo Merino I, NP  Admit date: 06/30/2021 Discharge date: 07/02/2021  Admitted From: Home Discharge disposition: Home   Code Status: Full Code   Discharge Diagnosis:   Principal Problem:   Chest pain Active Problems:   Hypertensive urgency   Type 2 diabetes mellitus without complication [facility])   Anxiety    Chief Complaint  Patient presents with   Chest Pain   Brief narrative: Autumn [name] is a 35 y.o. 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 Patient presented to the ED on 10/6 with complaint of constant sharp chest pain for more than 24 hours worse with movement and inspiration.  In the ED, patient was hypertensive to 190/100 with normal heart rate and oxygen saturation EKG showed normal sinus rhythm without features of acute ischemic changes Chest x-ray negative for acute cardiopulmonary disease. CBC and BMP unremarkable High sensitive troponin normal x2, BNP normal, D-dimer normal Despite treatment with Tylenol, Lidoderm, fentanyl and Robaxin, patient continued to have chest pain. ED physician discussed the case with cardiology who recommended observation to hospitalist service. See below for details  Subjective: Patient was seen and examined this morning.  Pleasant young African-American female.  Lying down in bed.  Not in distress.  Chest pain better than at presentation.  Currently complains of so only on deep inspiration. Chart reviewed.  Blood pressure improved to 130s and 140s in last 24 hours  Assessment/Plan: Chest pain -In the setting of mild nonobstructive CAD in 2012 and classic risk factors for progressive coronary artery disease -EKG unremarkable.  Troponin normal. -Echocardiogram yesterday showed EF 60 to 83%, normal diastolic parameters, wall motion abnormality could not be  read. -ESR and CRP normal. -Continues to have chest pain on deep inspiration. CT angio of chest yesterday showed coronary artery calcification, advanced for her age.  I discussed the case with cardiologist Dr. Marlou Porch this morning.  Earlier we had a plan to do a Myoview tomorrow but that was limited because of her BMI.  Per cardiology recommendation, will discharge her home today with a plan to have her follow-up in the office for a coronary CT. -Continue aspirin 325 mg daily, Lipitor 10 mg daily and beta-blocker Recent Labs    06/30/21 1212 06/30/21 1739  TROPONINIHS 2 3   Accelerated hypertension -Blood pressure was elevated to 190/100 in the ED -Home blood pressure regimen include bisoprolol 5 mg daily, amlodipine 10 mg daily, losartan 25 mg daily and HCTZ as needed. -Patient reports compliance to her medications.  Blood pressure on admission was elevated probably because of pain.  Blood pressure in 130s and 140s in last 24 hours.  Type 2 diabetes mellitus -A1c 6.7 on 05/20/2021 -Home meds include Trulicity 3.82 mg weekly, metformin 750 mg twice daily. -Continue the same at home.  Recent Labs  Lab 07/01/21 0805 07/01/21 1213 07/01/21 1753 07/02/21 0711 07/02/21 1138  GLUCAP 133* 123* 130* 141* 122*   Hyperlipidemia -Continue Lipitor  Anxiety  -Continue Buspar and Xanax   Morbid obesity  -Body mass index is 51.65 kg/m. Patient has been advised to make an attempt to improve diet and exercise patterns to aid in weight loss.   Allergies as of 07/02/2021   No Known Allergies      Medication List     TAKE these medications    acetaminophen 500 MG tablet Commonly known as: TYLENOL  Take 500 mg by mouth every 6 (six) hours as needed for mild pain or headache.   ALPRAZolam 0.5 MG tablet Commonly known as: Xanax Take 1 tablet (0.5 mg total) by mouth at bedtime as needed.   amLODipine 10 MG tablet Commonly known as: NORVASC Take 1 tablet (10 mg total) by mouth daily.    aspirin EC 325 MG tablet Take 325 mg by mouth daily.   atorvastatin 10 MG tablet Commonly known as: LIPITOR Take 1 tablet (10 mg total) by mouth daily.   bisoprolol 5 MG tablet Commonly known as: ZEBETA Take 1 tablet (5 mg total) by mouth daily.   blood glucose meter kit and supplies Dispense based on patient and insurance preference. Use up to four times daily as directed. (FOR ICD-9 250.00, 250.01).   busPIRone 10 MG tablet Commonly known as: BUSPAR Take 1 tablet (10 mg total) by mouth 2 (two) times daily.   ferrous sulfate 325 (65 FE) MG tablet Commonly known as: FerrouSul Take 1 tablet (325 mg total) by mouth daily with breakfast.   furosemide 20 MG tablet Commonly known as: Lasix Take 1 tablet (20 mg total) by mouth daily. What changed:  when to take this reasons to take this   hydrochlorothiazide 25 MG tablet Commonly known as: HYDRODIURIL Take 1 tablet (25 mg total) by mouth daily. What changed:  when to take this reasons to take this   losartan 25 MG tablet Commonly known as: Cozaar Take 1 tablet (25 mg total) by mouth daily.   metFORMIN 750 MG 24 hr tablet Commonly known as: GLUCOPHAGE-XR Take 1 tablet (750 mg total) by mouth 2 (two) times daily.   naproxen 500 MG tablet Commonly known as: NAPROSYN Take 1 tablet (500 mg total) by mouth 2 (two) times daily.   Trulicity 8.18 EX/9.3ZJ Sopn Generic drug: Dulaglutide Inject 0.75 mg into the skin once a week. What changed: additional instructions        Discharge Instructions:  Diet Recommendation: Cardiac/diabetic diet   Follow ups:    Follow-up Information     Passmore, Jake Church I, NP Follow up.   Specialty: Nurse Practitioner Contact information: [address] [place] [address] [phone]         Jerline Pain, MD Follow up.   Specialty: Cardiology Contact information: [address] [place] [address] [phone]                 Wound  care:     Discharge Exam:   Vitals:   07/01/21 1843 07/01/21 2252 07/02/21 0253 07/02/21 0844  BP: (!) 132/98 130/74 110/64 140/86  Pulse: (!) 57 62 68 63  Resp: 20 18 20 18   Temp: 99.1 F (37.3 C) 98.1 F (36.7 C) 98.5 F (36.9 C) 98.9 F (37.2 C)  TempSrc: Oral   Oral  SpO2: 100% 100% 100% 99%  Weight:      Height:        Body mass index is 51.65 kg/m.  General exam: Pleasant, young African-American female.  Morbidly obese Skin: No rashes, lesions or ulcers. HEENT: Atraumatic, normocephalic, no obvious bleeding Lungs: Clear to auscultation bilaterally CVS: Regular rate and rhythm, no murmur GI/Abd soft, nontender, distended from obesity, bowel sound present CNS: Alert, awake, oriented x3 Psychiatry: Mood appropriate Extremities: No pedal edema, no calf tenderness  Time coordinating discharge: 35 minutes   The results of significant diagnostics from this hospitalization (including imaging, microbiology, ancillary and laboratory) are listed below  for reference.    Procedures and Diagnostic Studies:   DG Chest 2 View  Result Date: 06/30/2021 CLINICAL DATA:  Chest pain and shortness of breath x1 day. EXAM: CHEST - 2 VIEW COMPARISON:  Chest radiograph 01/14/2019 FINDINGS: The heart size and mediastinal contours are within normal limits. Both lungs are clear. No pleural effusion or pneumothorax. The visualized skeletal structures are unremarkable. IMPRESSION: No active cardiopulmonary disease. Electronically Signed   By: Ileana Roup M.D.   On: 06/30/2021 11:14   CT ANGIO CHEST AORTA W/CM & OR WO/CM  Result Date: 07/01/2021 CLINICAL DATA:  Chest pain or back pain, aortic dissection suspected EXAM: CT ANGIOGRAPHY CHEST WITH CONTRAST TECHNIQUE: Multidetector CT imaging of the chest was performed using the standard protocol during bolus administration of intravenous contrast. Multiplanar CT image reconstructions and MIPs were obtained to evaluate the vascular anatomy. CONTRAST:   168m OMNIPAQUE IOHEXOL 350 MG/ML SOLN COMPARISON:  Chest x-ray 06/30/2021 FINDINGS: Cardiovascular: Noncontrast CT demonstrates no evidence of aortic intramural hemotoma. Preferential opacification of the thoracic aorta. No evidence of thoracic aortic aneurysm or dissection. Three vessel arch. Coronary artery calcifications are evident, advanced for age. Central pulmonary vasculature is within normal limits. No central filling defects. Normal heart size. No pericardial effusion. Mediastinum/Nodes: Soft tissue density in the anterior mediastinum with interspersed fat compatible with thymic tissue. No axillary, mediastinal, or hilar lymphadenopathy. Trachea and esophagus within normal limits. Subcentimeter left thyroid lobe nodule. Not clinically significant; no follow-up imaging recommended (ref: J Am Coll Radiol. 2015 Feb;12(2): 143-50). Lungs/Pleura: Lungs are clear. No pleural effusion or pneumothorax. Upper Abdomen: Diffusely decreased attenuation of the hepatic parenchyma compatible with hepatic steatosis. No acute findings within the visualized upper abdomen. Musculoskeletal: No chest wall abnormality. No acute or significant osseous findings. Review of the MIP images confirms the above findings. IMPRESSION: 1. Negative for thoracic aortic aneurysm or dissection. 2. No acute intrathoracic findings. 3. Coronary artery calcifications, advanced for age. The severity of coronary artery disease and any potential stenosis cannot be assessed on this non-gated CT examination. Assessment for potential risk factor modification, dietary therapy or pharmacologic therapy may be warranted, if clinically indicated. 4. Hepatic steatosis. Electronically Signed   By: NDavina PokeD.O.   On: 07/01/2021 14:35   ECHOCARDIOGRAM COMPLETE  Result Date: 07/01/2021    ECHOCARDIOGRAM REPORT   Patient Name:   JLEEANNA SLABYDate of Exam: 07/01/2021 Medical Rec #:  [id number]             Height:       66.0 in Accession #:     [id number]            Weight:       320.0 lb Date of Birth:  501-23-1987              BSA:          2.442 m Patient Age:    35 years               BP:           148/104 mmHg Patient Gender: F                      HR:           58 bpm. Exam Location:  Inpatient Procedure: 2D Echo, Cardiac Doppler, Color Doppler and Intracardiac            Opacification Agent Indications:    Chest pain  History:        Patient has no prior history of Echocardiogram examinations.                 Risk Factors:Hypertension and Diabetes.  Sonographer:    Maudry Mayhew MHA, RDMS, RVT, RDCS Referring Phys: [id number] Ilene Qua OPYD  Sonographer Comments: Technically challenging study due to limited acoustic windows. IMPRESSIONS  1. Left ventricular ejection fraction, by estimation, is 60 to 65%. The left ventricle has normal function. Left ventricular endocardial border not optimally defined to evaluate regional wall motion. Left ventricular diastolic parameters were normal.  2. Right ventricular systolic function is normal. The right ventricular size is normal.  3. The mitral valve is normal in structure. No evidence of mitral valve regurgitation. No evidence of mitral stenosis.  4. The aortic valve is normal in structure. Aortic valve regurgitation is not visualized. No aortic stenosis is present.  5. The inferior vena cava is normal in size with greater than 50% respiratory variability, suggesting right atrial pressure of 3 mmHg. FINDINGS  Left Ventricle: Left ventricular ejection fraction, by estimation, is 60 to 65%. The left ventricle has normal function. Left ventricular endocardial border not optimally defined to evaluate regional wall motion. Definity contrast agent was given IV to delineate the left ventricular endocardial borders. The left ventricular internal cavity size was normal in size. There is no left ventricular hypertrophy. Left ventricular diastolic parameters were normal. Right Ventricle: The right ventricular size  is normal. No increase in right ventricular wall thickness. Right ventricular systolic function is normal. Left Atrium: Left atrial size was normal in size. Right Atrium: Right atrial size was normal in size. Pericardium: There is no evidence of pericardial effusion. Mitral Valve: The mitral valve is normal in structure. No evidence of mitral valve regurgitation. No evidence of mitral valve stenosis. Tricuspid Valve: The tricuspid valve is normal in structure. Tricuspid valve regurgitation is trivial. No evidence of tricuspid stenosis. Aortic Valve: The aortic valve is normal in structure. Aortic valve regurgitation is not visualized. No aortic stenosis is present. Aortic valve mean gradient measures 4.0 mmHg. Aortic valve peak gradient measures 9.1 mmHg. Aortic valve area, by VTI measures 1.30 cm. Pulmonic Valve: The pulmonic valve was normal in structure. Pulmonic valve regurgitation is not visualized. No evidence of pulmonic stenosis. Aorta: The aortic root is normal in size and structure. Venous: The inferior vena cava is normal in size with greater than 50% respiratory variability, suggesting right atrial pressure of 3 mmHg. IAS/Shunts: No atrial level shunt detected by color flow Doppler.  LEFT VENTRICLE PLAX 2D LVIDd:         4.60 cm   Diastology LVIDs:         3.60 cm   LV e' medial:    9.90 cm/s LV PW:         0.80 cm   LV E/e' medial:  11.0 LV IVS:        0.90 cm   LV e' lateral:   10.30 cm/s LVOT diam:     1.50 cm   LV E/e' lateral: 10.6 LV SV:         40 LV SV Index:   16 LVOT Area:     1.77 cm  RIGHT VENTRICLE TAPSE (M-mode): 2.7 cm LEFT ATRIUM             Index        RIGHT ATRIUM           Index LA diam:  3.90 cm 1.60 cm/m   RA Area:     18.10 cm LA Vol (A2C):   62.4 ml 25.55 ml/m  RA Volume:   51.30 ml  21.01 ml/m LA Vol (A4C):   36.7 ml 15.03 ml/m LA Biplane Vol: 52.4 ml 21.46 ml/m  AORTIC VALVE AV Area (Vmax):    1.22 cm AV Area (Vmean):   1.23 cm AV Area (VTI):     1.30 cm AV  Vmax:           151.00 cm/s AV Vmean:          97.200 cm/s AV VTI:            0.304 m AV Peak Grad:      9.1 mmHg AV Mean Grad:      4.0 mmHg LVOT Vmax:         104.00 cm/s LVOT Vmean:        67.800 cm/s LVOT VTI:          0.224 m LVOT/AV VTI ratio: 0.74  AORTA Ao Root diam: 2.00 cm MITRAL VALVE                TRICUSPID VALVE MV Area (PHT): 4.39 cm     TR Peak grad:   9.2 mmHg MV Decel Time: 173 msec     TR Vmax:        152.00 cm/s MV E velocity: 109.00 cm/s MV A velocity: 76.60 cm/s   SHUNTS MV E/A ratio:  1.42         Systemic VTI:  0.22 m                             Systemic Diam: 1.50 cm Candee Furbish MD Electronically signed by Candee Furbish MD Signature Date/Time: 07/01/2021/3:40:31 PM    Final      Labs:   Basic Metabolic Panel: Recent Labs  Lab 06/30/21 1212  NA 139  K 4.1  CL 107  CO2 26  GLUCOSE 135*  BUN 9  CREATININE 0.65  CALCIUM 9.3   GFR Estimated Creatinine Clearance: 142.4 mL/min (by C-G formula based on SCr of 0.65 mg/dL). Liver Function Tests: No results for input(s): AST, ALT, ALKPHOS, BILITOT, PROT, ALBUMIN in the last 168 hours. No results for input(s): LIPASE, AMYLASE in the last 168 hours. No results for input(s): AMMONIA in the last 168 hours. Coagulation profile No results for input(s): INR, PROTIME in the last 168 hours.  CBC: Recent Labs  Lab 06/30/21 1212  WBC 7.5  HGB 12.2  HCT 39.1  MCV 82.3  PLT 335   Cardiac Enzymes: No results for input(s): CKTOTAL, CKMB, CKMBINDEX, TROPONINI in the last 168 hours. BNP: Invalid input(s): POCBNP CBG: Recent Labs  Lab 07/01/21 0805 07/01/21 1213 07/01/21 1753 07/02/21 0711 07/02/21 1138  GLUCAP 133* 123* 130* 141* 122*   D-Dimer Recent Labs    07/01/21 0045  DDIMER 0.31   Hgb A1c No results for input(s): HGBA1C in the last 72 hours. Lipid Profile Recent Labs    07/01/21 0638  CHOL 165  HDL 47  LDLCALC 104*  TRIG 69  CHOLHDL 3.5   Thyroid function studies No results for input(s): TSH,  T4TOTAL, T3FREE, THYROIDAB in the last 72 hours.  Invalid input(s): FREET3 Anemia work up No results for input(s): VITAMINB12, FOLATE, FERRITIN, TIBC, IRON, RETICCTPCT in the last 72 hours. Microbiology Recent Results (from the past 240 hour(s))  Resp Panel  by RT-PCR (Flu A&B, Covid) Nasopharyngeal Swab     Status: None   Collection Time: 07/01/21 12:45 AM   Specimen: Nasopharyngeal Swab; Nasopharyngeal(NP) swabs in vial transport medium  Result Value Ref Range Status   SARS Coronavirus 2 by RT PCR NEGATIVE NEGATIVE Final    Comment: (NOTE) SARS-CoV-2 target nucleic acids are NOT DETECTED.  The SARS-CoV-2 RNA is generally detectable in upper respiratory specimens during the acute phase of infection. The lowest concentration of SARS-CoV-2 viral copies this assay can detect is 138 copies/mL. A negative result does not preclude SARS-Cov-2 infection and should not be used as the sole basis for treatment or other patient management decisions. A negative result may occur with  improper specimen collection/handling, submission of specimen other than nasopharyngeal swab, presence of viral mutation(s) within the areas targeted by this assay, and inadequate number of viral copies(<138 copies/mL). A negative result must be combined with clinical observations, patient history, and epidemiological information. The expected result is Negative.  Fact Sheet for Patients:  EntrepreneurPulse.com.au  Fact Sheet for Healthcare Providers:  IncredibleEmployment.be  This test is no t yet approved or cleared by the [place] FDA and  has been authorized for detection and/or diagnosis of SARS-CoV-2 by FDA under an Emergency Use Authorization (EUA). This EUA will remain  in effect (meaning this test can be used) for the duration of the COVID-19 declaration under Section 564(b)(1) of the Act, 21 U.S.C.section 360bbb-3(b)(1), unless the authorization is terminated   or revoked sooner.       Influenza A by PCR NEGATIVE NEGATIVE Final   Influenza B by PCR NEGATIVE NEGATIVE Final    Comment: (NOTE) The Xpert Xpress SARS-CoV-2/FLU/RSV plus assay is intended as an aid in the diagnosis of influenza from Nasopharyngeal swab specimens and should not be used as a sole basis for treatment. Nasal washings and aspirates are unacceptable for Xpert Xpress SARS-CoV-2/FLU/RSV testing.  Fact Sheet for Patients: EntrepreneurPulse.com.au  Fact Sheet for Healthcare Providers: IncredibleEmployment.be  This test is not yet approved or cleared by the [place] FDA and has been authorized for detection and/or diagnosis of SARS-CoV-2 by FDA under an Emergency Use Authorization (EUA). This EUA will remain in effect (meaning this test can be used) for the duration of the COVID-19 declaration under Section 564(b)(1) of the Act, 21 U.S.C. section 360bbb-3(b)(1), unless the authorization is terminated or revoked.  Performed at [facility], [place] [address], [place], [place] [address]      Signed: Marlowe Aschoff Safiyyah Vasconez  [facility] 07/02/2021, 12:05 PM

## 2021-07-02 NOTE — Plan of Care (Signed)

## 2021-07-02 NOTE — Progress Notes (Signed)
Patient's family was concerned about the discharge plan. They wanted patient to have procedures done while in the hospital because of the risk of not being able to follow-up with cardiologist as an outpatient. I discussed with Dr. Anne Fu again. Will keep her in the hospital Cardiology to see her on Monday to plan for a coronary CT or cardiac cath. We will keep her n.p.o. after midnight Sunday through Monday morning. No need of heparin drip at this time. Communicated with RN.

## 2021-07-03 DIAGNOSIS — R0789 Other chest pain: Principal | ICD-10-CM

## 2021-07-03 DIAGNOSIS — E669 Obesity, unspecified: Secondary | ICD-10-CM

## 2021-07-03 DIAGNOSIS — E785 Hyperlipidemia, unspecified: Secondary | ICD-10-CM

## 2021-07-03 DIAGNOSIS — I10 Essential (primary) hypertension: Secondary | ICD-10-CM

## 2021-07-03 LAB — GLUCOSE, CAPILLARY
Glucose-Capillary: 155 mg/dL — ABNORMAL HIGH (ref 70–99)
Glucose-Capillary: 142 mg/dL — ABNORMAL HIGH (ref 70–99)
Glucose-Capillary: 165 mg/dL — ABNORMAL HIGH (ref 70–99)
Glucose-Capillary: 180 mg/dL — ABNORMAL HIGH (ref 70–99)

## 2021-07-03 MED ORDER — METOPROLOL TARTRATE 50 MG PO TABS
50.0000 mg | ORAL_TABLET | Freq: Once | ORAL | Status: AC | PRN
  Administered 2021-07-04: 50 mg via ORAL
  Filled 2021-07-03: qty 1

## 2021-07-03 NOTE — Plan of Care (Signed)
  Problem: Education: Goal: Knowledge of General Education information will improve Description: Including pain rating scale, medication(s)/side effects and non-pharmacologic comfort measures Outcome: Progressing   Problem: Clinical Measurements: Goal: Diagnostic test results will improve Outcome: Progressing Goal: Respiratory complications will improve Outcome: Progressing   Problem: Activity: Goal: Risk for activity intolerance will decrease Outcome: Progressing   Problem: Nutrition: Goal: Adequate nutrition will be maintained Outcome: Progressing   

## 2021-07-03 NOTE — Consult Note (Signed)
Cardiology Consultation:   Patient ID: Autumn [name] MRN: [id number]; DOB: Aug 21, 1986  Admit date: 06/30/2021 Date of Consult: 07/03/2021  PCP:  Teena Dunk, NP   [facility] Providers Cardiologist: Previously seen by Dr. Debara Pickett 01/13/2016        Patient Profile:   Autumn [name] is a 35 y.o. female with a hx of chest pain, hypertension, mitral regurgitation, nonobstructive coronary disease by heart catheterization 2010 who is being seen 07/03/2021 for the evaluation of chest pain at the request of Dr. Pietro Cassis  History of Present Illness:   Ms. Bordas is a 35 year old female with longstanding history of prior chest discomfort.  In 2010 underwent heart catheterization by Dr. Terrence Dupont and was found to have mild nonobstructive CAD, small caliber PDA, circumflex vessel.  In review of prior office note from Dr. Debara Pickett in 2017 she had been on a number of medications to try to treat/alleviate her chest discomfort with no benefit.  She was also found to have a leaky heart valve previously described as mitral mild regurgitation with EF of 54%.  She had been seen as well by Dr. Einar Gip who was at 1 point treating her hypertension.  She was then seen by Dr. Fransico Him in 2014 and then most recently by Dr. Debara Pickett in 2017.  She previously in 2017 had asked for repeat heart catheterizations but this has been declined in the past.  She has been on a blood pressure regimen of bisoprolol lisinopril hydrochlorothiazide with overall reasonable control.  She also has been taking atorvastatin for hyperlipidemia.  She had previously reported dizziness fatigue weakness heaviness in her chest area legs arms and was referred to neurology for further evaluation at that time.  She still has chest pain, uncomfortable pressure off and on.  She felt like somebody was sitting on her chest.  Comes and goes in waves.  She woke up this morning feeling poorly.  Once again reassured her from a troponin/EKG  standpoint as well as echo standpoint demonstrating to her and her family that there was no evidence of damage.  Also aorta is normal.  Here, her troponins high-sensitivity were normal.  EKG also normal With heart rate of 83 on ECG on arrival.  CT scan of aorta showed no signs of dissection.  Coronary artery calcifications were noted.  Hepatic steatosis also noted.  Personally reviewed CT scans and agree.  Echocardiogram performed on 07/01/2021 shows EF of 65% RV normal no evidence of mitral valve regurgitation normal aorta.  Doing well.  I discussed her case with her attending, Dr. Pietro Cassis yesterday and given her overall reassuring troponins ECG CT scan as well as echocardiogram, I felt comfortable allowing her to be discharged with outpatient follow-up for coronary CT scan.  When this was discussed with the family, they refused discharge and wished to have all of her testing done while she was here in the hospital.  I then discussed this with Dr. Pietro Cassis.   Past Medical History:  Diagnosis Date   Diabetes mellitus, type II ([place])    Essential hypertension    H/O varicella    Headache    Heart murmur    Heel spur, right 01/2020   Hypertension    stopped meds since pregnant   Leaky heart valve    mild MR by echo   Mini stroke    2018    Obese    Prediabetes    Torn Achilles tendon, right, initial encounter 01/2020   Vitamin D deficiency  02/2020    Past Surgical History:  Procedure Laterality Date   BILATERAL SALPINGECTOMY Bilateral 11/28/2013   Procedure: BILATERAL SALPINGECTOMY;  Surgeon: Betsy Coder, MD;  Location: [facility];  Service: Obstetrics;  Laterality: Bilateral;  Partial on Left   CESAREAN SECTION  05/14/2012   Procedure: CESAREAN SECTION;  Surgeon: Eldred Manges, MD;  Location: [facility];  Service: Gynecology;  Laterality: N/A;  Primary cesarean section of baby girl    at [place] 8/9   CESAREAN SECTION N/A 11/28/2013   Procedure: CESAREAN SECTION;  Surgeon: Betsy Coder,  MD;  Location: [facility];  Service: Obstetrics;  Laterality: N/A;   CESAREAN SECTION       Home Medications:  Prior to Admission medications   Medication Sig Start Date End Date Taking? Authorizing Provider  acetaminophen (TYLENOL) 500 MG tablet Take 500 mg by mouth every 6 (six) hours as needed for mild pain or headache.   Yes [provider]  ALPRAZolam (XANAX) 0.5 MG tablet Take 1 tablet (0.5 mg total) by mouth at bedtime as needed. 06/07/20  Yes Azzie Glatter, FNP  amLODipine (NORVASC) 10 MG tablet Take 1 tablet (10 mg total) by mouth daily. 05/20/21  Yes Passmore, Jake Church I, NP  aspirin EC 325 MG tablet Take 325 mg by mouth daily.   Yes [provider]  atorvastatin (LIPITOR) 10 MG tablet Take 1 tablet (10 mg total) by mouth daily. 05/20/21  Yes Passmore, Jake Church I, NP  bisoprolol (ZEBETA) 5 MG tablet Take 1 tablet (5 mg total) by mouth daily. 09/02/18  Yes Azzie Glatter, FNP  blood glucose meter kit and supplies Dispense based on patient and insurance preference. Use up to four times daily as directed. (FOR ICD-9 250.00, 250.01). 05/03/17  Yes Domenic Polite, MD  busPIRone (BUSPAR) 10 MG tablet Take 1 tablet (10 mg total) by mouth 2 (two) times daily. 11/13/18  Yes Azzie Glatter, FNP  Dulaglutide (TRULICITY) 9.50 DT/2.6ZT SOPN Inject 0.75 mg into the skin once a week. Patient taking differently: Inject 0.75 mg into the skin once a week. Mondays 05/20/21  Yes Passmore, Jake Church I, NP  furosemide (LASIX) 20 MG tablet Take 1 tablet (20 mg total) by mouth daily. Patient taking differently: Take 20 mg by mouth daily as needed for fluid or edema. 05/20/21  Yes Passmore, Jake Church I, NP  hydrochlorothiazide (HYDRODIURIL) 25 MG tablet Take 1 tablet (25 mg total) by mouth daily. Patient taking differently: Take 25 mg by mouth daily as needed. 05/20/21  Yes Passmore, Jake Church I, NP  losartan (COZAAR) 25 MG tablet Take 1 tablet (25 mg total) by mouth daily. 05/20/21  Yes Passmore, Jake Church I,  NP  metFORMIN (GLUCOPHAGE-XR) 750 MG 24 hr tablet Take 1 tablet (750 mg total) by mouth 2 (two) times daily. 05/20/21  Yes Passmore, Jake Church I, NP  naproxen (NAPROSYN) 500 MG tablet Take 1 tablet (500 mg total) by mouth 2 (two) times daily. 05/20/21  Yes Passmore, Jake Church I, NP  ferrous sulfate (FERROUSUL) 325 (65 FE) MG tablet Take 1 tablet (325 mg total) by mouth daily with breakfast. Patient not taking: No sig reported 05/03/17   Domenic Polite, MD    Inpatient Medications: Scheduled Meds:  amLODipine  10 mg Oral Daily   aspirin EC  81 mg Oral Daily   atorvastatin  10 mg Oral Daily   bisoprolol  5 mg Oral Daily   busPIRone  10 mg Oral BID   enoxaparin (LOVENOX) injection  40 mg  Subcutaneous Q24H   hydrochlorothiazide  25 mg Oral Daily   insulin aspart  0-5 Units Subcutaneous QHS   insulin aspart  0-9 Units Subcutaneous TID WC   lidocaine  1 patch Transdermal Q24H   losartan  25 mg Oral Daily   pantoprazole  40 mg Oral Q1200   Continuous Infusions:  PRN Meds: acetaminophen, ALPRAZolam, methocarbamol, [START ON 07/04/2021] metoprolol tartrate, nitroGLYCERIN, ondansetron (ZOFRAN) IV  Allergies:   No Known Allergies  Social History:   Social History   Socioeconomic History   Marital status: Married    Spouse name: Not on file   Number of children: 2   Years of education: Not on file   Highest education level: Not on file  Occupational History   Not on file  Tobacco Use   Smoking status: Never   Smokeless tobacco: Never  Vaping Use   Vaping Use: Never used  Substance and Sexual Activity   Alcohol use: No   Drug use: No   Sexual activity: Yes    Birth control/protection: None  Other Topics Concern   Not on file  Social History Narrative   ** Merged History Encounter **       Lives with husband and 2 children in a one story home.   Works as an Building control surveyor.   Education: college degree.   Social Determinants of Health   Financial Resource Strain: Not on file   Food Insecurity: Not on file  Transportation Needs: Not on file  Physical Activity: Not on file  Stress: Not on file  Social Connections: Not on file  Intimate Partner Violence: Not on file    Family History:    Family History  Problem Relation Age of Onset   Hypertension Mother    Diabetes Mellitus II Mother    Breast cancer Mother 102   Heart failure Mother    CAD Father    Hyperlipidemia Father    Heart disease Father    Heart attack Father    Hypertension Sister    Diabetes Mellitus II Sister    Diabetes Sister    Thyroid disease Sister    Anemia Sister    Hypertension Sister    Depression Sister    Heart murmur Sister    Heart murmur Daughter    Anesthesia problems Neg Hx      ROS:  Please see the history of present illness.   All other ROS reviewed and negative.     Physical Exam/Data:   Vitals:   07/02/21 0844 07/02/21 1422 07/02/21 2135 07/03/21 0447  BP: 140/86 112/83 114/67 (!) 145/84  Pulse: 63 62 (!) 52 63  Resp: 18 20 16 14   Temp: 98.9 F (37.2 C) 98.2 F (36.8 C) 98.5 F (36.9 C) 98.4 F (36.9 C)  TempSrc: Oral Oral Oral   SpO2: 99% 100% 100% 100%  Weight:      Height:        Intake/Output Summary (Last 24 hours) at 07/03/2021 0851 Last data filed at 07/02/2021 1900 Gross per 24 hour  Intake 600 ml  Output --  Net 600 ml   Last 3 Weights 06/30/2021 05/20/2021 07/07/2020  Weight (lbs) 320 lb 328 lb 0.6 oz 338 lb  Weight (kg) 145.151 kg 148.797 kg 153.316 kg  Some encounter information is confidential and restricted. Go to Review Flowsheets activity to see all data.     Body mass index is 51.65 kg/m.  General:  Well nourished, well developed, in no acute distress  morbidly obese HEENT: normal Neck: no JVD Vascular: No carotid bruits; Distal pulses 2+ bilaterally Cardiac:  normal S1, S2; RRR; no murmur  Lungs:  clear to auscultation bilaterally, no wheezing, rhonchi or rales  Abd: soft, nontender, no hepatomegaly  Ext: no  edema Musculoskeletal:  No deformities, BUE and BLE strength normal and equal Skin: warm and dry  Neuro:  CNs 2-12 intact, no focal abnormalities noted Psych:  Normal affect   EKG:  The EKG was personally reviewed and demonstrates: Sinus rhythm 83 no other abnormalities  Telemetry:  Telemetry was personally reviewed and demonstrates: Currently sinus bradycardia 57.  No adverse arrhythmias.  Relevant CV Studies:   Mohan N. Terrence Dupont, M.D. DATE OF BIRTH:  04-07-1986    DATE OF PROCEDURE:  04/08/2009  DATE OF DISCHARGE:  04/08/2009                             CARDIAC CATHETERIZATION    PROCEDURES:  Left cardiac catheterization with selective left and right  coronary angiography, left ventriculography via right groin using  Judkins technique.    INDICATIONS FOR PROCEDURE:  Ms. Credeur is a 35 year old black female  with past medical history significant for hypertension, morbid obesity,  positive family history of coronary artery disease, degenerative joint  disease, chronic headache, complains of retrosternal chest pain,  described as tightness and sharp, occasionally radiating to the left arm  associated with numbness in the left arm and palpitation and dizziness  lasting few minutes.  The patient denies any syncopal episode, also  complains of exertional dyspnea.  Denies PND, orthopnea, or leg  swelling.  The patient underwent adenosine Myoview on July 8, which  showed large area of anterolateral wall ischemia with EF of 60%.  Denies  any relation of chest pain to food, breathing, or movement.  Denies any  history of neck trauma or motor vehicle accident.    PAST MEDICAL HISTORY:  As above.    PAST SURGICAL HISTORY:  None.    ALLERGIES:  None.    MEDICATIONS AT HOME:  She is on:  1. Toprol-XL 25 mg p.o. daily.  2. Enteric-coated aspirin 81 mg p.o. daily.  3. Nitrostat 0.4 mg sublingual daily.  4. Diovan HCT 320/12.5 daily.    SOCIAL HISTORY:  She is single.  No children.   No history of smoking or  alcohol abuse.  Drinks wine occasionally.  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    FAMILY HISTORY:  Father is alive.  He has coronary artery disease.  Mother is diabetic and hypertensive.  One aunt had MI at the age of 5.  She has 4 sisters in good health.    PHYSICAL EXAMINATION:  GENERAL:  She is alert, awake, and oriented x3,  in no acute distress.  VITAL SIGNS:  Blood pressure was 150/84, pulse was 84 and regular.  HEENT:  Conjunctivae pink.  NECK:  Supple.  No JVD.  No bruit.  LUNGS:  Clear to auscultation without rhonchi or rales.  CARDIOVASCULAR:  S1 and S2 normal.  There was soft systolic murmur.  ABDOMEN:  Soft.  Bowel sounds present.  Obese and nontender.  EXTREMITIES:  There was no clubbing, cyanosis, or edema.    IMPRESSION:  1. Recurrent chest pain.  2. Left arm numbness.  3. Positive adenosine Myoview, rule out coronary insufficiency.  4. Hypertension.  5. Morbid obesity.  6. Positive family history of coronary artery disease.  7. History of chronic headaches.    I discussed with the patient at length regarding adenosine Myoview  result and left cath, its risks and benefits, i.e., death, MI, stroke,  need for emergency CABG, local vascular complications, etc., and  consented for the procedure.    PROCEDURE:  After obtaining informed consent, the patient was brought to  the cath lab and was placed on fluoroscopy table.  Right groin was  prepped and draped in usual fashion.  Xylocaine 1% was used for local  anesthesia.  With the help of thin-wall needle, 4-French arterial sheath  was placed.  The sheath was aspirated and flushed.  Next, a 4-French  left Judkins catheter was advanced over the wire under fluoroscopic  guidance up to the ascending aorta.  Wire was pulled out.  The catheter  was aspirated and connected to the manifold.  Catheter was further  advanced and engaged into the left coronary ostium.  Multiple views of   the left system were taken.  Next, the catheter was disengaged and was  pulled out over the wire and was replaced with 4-French 3D right  diagnostic catheter, which was advanced over the wire under fluoroscopic  guidance up to the ascending aorta.  Wire was pulled out.  The catheter  was aspirated and connected to the manifold.  Catheter was further  advanced and engaged into the right coronary ostium.  Multiple views of  the right system were taken.  Next, catheter was disengaged and was  pulled out over the wire and was replaced with 4-French pigtail  catheter, which was advanced over the wire under fluoroscopic guidance  into the ascending aorta.  Wire was pulled out.  The catheter was  aspirated and connected to the manifold.  Catheter was further advanced  across the aortic valve into the LV.  LV pressures were recorded.  Next,  LV graphy was done in 30-degree RAO position.  Postangiographic  pressures were recorded from LV and then pullback pressures were  recorded from the aorta.  There was no significant gradient across the  aortic valve.  Next, a pigtail catheter was pulled out over the wire.  Sheaths were aspirated and flushed.    FINDINGS:  LV showed good LV systolic function, EF of approximately 60%.  Left main was patent.  LAD was patent.  Diagonal one was small, which  was patent.  Diagonal 2 was very small, which was patent.  The left  circumflex has 10% proximal stenosis and it tapers down in AV groove  after large OM2.  OM1 was very very small.  OM2 was large, which was  patent.  RCA has 10-15% mid stenosis.  PDA and PLV branches were very  small, which were patent.  The patient tolerated the procedure well.  There were no complications.  The patient was transferred to recovery  room in stable condition.          Allegra Lai. Terrence Dupont, M.D.  Electronically Signed    Laboratory Data:  High Sensitivity Troponin:   Recent Labs  Lab 06/30/21 1212 06/30/21 1739   TROPONINIHS 2 3     Chemistry Recent Labs  Lab 06/30/21 1212  NA 139  K 4.1  CL 107  CO2 26  GLUCOSE 135*  BUN 9  CREATININE 0.65  CALCIUM 9.3  GFRNONAA >60  ANIONGAP 6    No results for input(s): PROT, ALBUMIN, AST, ALT, ALKPHOS, BILITOT in the last 168 hours. Lipids  Recent Labs  Lab 07/01/21  2800  CHOL 165  TRIG 69  HDL 47  LDLCALC 104*  CHOLHDL 3.5    Hematology Recent Labs  Lab 06/30/21 1212  WBC 7.5  RBC 4.75  HGB 12.2  HCT 39.1  MCV 82.3  MCH 25.7*  MCHC 31.2  RDW 14.2  PLT 335   Thyroid No results for input(s): TSH, FREET4 in the last 168 hours.  BNP Recent Labs  Lab 06/30/21 1212  BNP 21.8    DDimer  Recent Labs  Lab 07/01/21 0045  DDIMER 0.31     Radiology/Studies:  DG Chest 2 View  Result Date: 06/30/2021 CLINICAL DATA:  Chest pain and shortness of breath x1 day. EXAM: CHEST - 2 VIEW COMPARISON:  Chest radiograph 01/14/2019 FINDINGS: The heart size and mediastinal contours are within normal limits. Both lungs are clear. No pleural effusion or pneumothorax. The visualized skeletal structures are unremarkable. IMPRESSION: No active cardiopulmonary disease. Electronically Signed   By: Ileana Roup M.D.   On: 06/30/2021 11:14   CT ANGIO CHEST AORTA W/CM & OR WO/CM  Result Date: 07/01/2021 CLINICAL DATA:  Chest pain or back pain, aortic dissection suspected EXAM: CT ANGIOGRAPHY CHEST WITH CONTRAST TECHNIQUE: Multidetector CT imaging of the chest was performed using the standard protocol during bolus administration of intravenous contrast. Multiplanar CT image reconstructions and MIPs were obtained to evaluate the vascular anatomy. CONTRAST:  177m OMNIPAQUE IOHEXOL 350 MG/ML SOLN COMPARISON:  Chest x-ray 06/30/2021 FINDINGS: Cardiovascular: Noncontrast CT demonstrates no evidence of aortic intramural hemotoma. Preferential opacification of the thoracic aorta. No evidence of thoracic aortic aneurysm or dissection. Three vessel arch. Coronary  artery calcifications are evident, advanced for age. Central pulmonary vasculature is within normal limits. No central filling defects. Normal heart size. No pericardial effusion. Mediastinum/Nodes: Soft tissue density in the anterior mediastinum with interspersed fat compatible with thymic tissue. No axillary, mediastinal, or hilar lymphadenopathy. Trachea and esophagus within normal limits. Subcentimeter left thyroid lobe nodule. Not clinically significant; no follow-up imaging recommended (ref: J Am Coll Radiol. 2015 Feb;12(2): 143-50). Lungs/Pleura: Lungs are clear. No pleural effusion or pneumothorax. Upper Abdomen: Diffusely decreased attenuation of the hepatic parenchyma compatible with hepatic steatosis. No acute findings within the visualized upper abdomen. Musculoskeletal: No chest wall abnormality. No acute or significant osseous findings. Review of the MIP images confirms the above findings. IMPRESSION: 1. Negative for thoracic aortic aneurysm or dissection. 2. No acute intrathoracic findings. 3. Coronary artery calcifications, advanced for age. The severity of coronary artery disease and any potential stenosis cannot be assessed on this non-gated CT examination. Assessment for potential risk factor modification, dietary therapy or pharmacologic therapy may be warranted, if clinically indicated. 4. Hepatic steatosis. Electronically Signed   By: NDavina PokeD.O.   On: 07/01/2021 14:35   ECHOCARDIOGRAM COMPLETE  Result Date: 07/01/2021    ECHOCARDIOGRAM REPORT   Patient Name:   JKINSLY HILDDate of Exam: 07/01/2021 Medical Rec #:  [id number]             Height:       66.0 in Accession #:    [id number]            Weight:       320.0 lb Date of Birth:  [birth year]              BSA:          2.442 m Patient Age:    89years  BP:           148/104 mmHg Patient Gender: F                      HR:           58 bpm. Exam Location:  Inpatient Procedure: 2D Echo, Cardiac Doppler, Color  Doppler and Intracardiac            Opacification Agent Indications:    Chest pain  History:        Patient has no prior history of Echocardiogram examinations.                 Risk Factors:Hypertension and Diabetes.  Sonographer:    Maudry Mayhew MHA, RDMS, RVT, RDCS Referring Phys: [id number] Ilene Qua OPYD  Sonographer Comments: Technically challenging study due to limited acoustic windows. IMPRESSIONS  1. Left ventricular ejection fraction, by estimation, is 60 to 65%. The left ventricle has normal function. Left ventricular endocardial border not optimally defined to evaluate regional wall motion. Left ventricular diastolic parameters were normal.  2. Right ventricular systolic function is normal. The right ventricular size is normal.  3. The mitral valve is normal in structure. No evidence of mitral valve regurgitation. No evidence of mitral stenosis.  4. The aortic valve is normal in structure. Aortic valve regurgitation is not visualized. No aortic stenosis is present.  5. The inferior vena cava is normal in size with greater than 50% respiratory variability, suggesting right atrial pressure of 3 mmHg. FINDINGS  Left Ventricle: Left ventricular ejection fraction, by estimation, is 60 to 65%. The left ventricle has normal function. Left ventricular endocardial border not optimally defined to evaluate regional wall motion. Definity contrast agent was given IV to delineate the left ventricular endocardial borders. The left ventricular internal cavity size was normal in size. There is no left ventricular hypertrophy. Left ventricular diastolic parameters were normal. Right Ventricle: The right ventricular size is normal. No increase in right ventricular wall thickness. Right ventricular systolic function is normal. Left Atrium: Left atrial size was normal in size. Right Atrium: Right atrial size was normal in size. Pericardium: There is no evidence of pericardial effusion. Mitral Valve: The mitral valve is  normal in structure. No evidence of mitral valve regurgitation. No evidence of mitral valve stenosis. Tricuspid Valve: The tricuspid valve is normal in structure. Tricuspid valve regurgitation is trivial. No evidence of tricuspid stenosis. Aortic Valve: The aortic valve is normal in structure. Aortic valve regurgitation is not visualized. No aortic stenosis is present. Aortic valve mean gradient measures 4.0 mmHg. Aortic valve peak gradient measures 9.1 mmHg. Aortic valve area, by VTI measures 1.30 cm. Pulmonic Valve: The pulmonic valve was normal in structure. Pulmonic valve regurgitation is not visualized. No evidence of pulmonic stenosis. Aorta: The aortic root is normal in size and structure. Venous: The inferior vena cava is normal in size with greater than 50% respiratory variability, suggesting right atrial pressure of 3 mmHg. IAS/Shunts: No atrial level shunt detected by color flow Doppler.  LEFT VENTRICLE PLAX 2D LVIDd:         4.60 cm   Diastology LVIDs:         3.60 cm   LV e' medial:    9.90 cm/s LV PW:         0.80 cm   LV E/e' medial:  11.0 LV IVS:        0.90 cm   LV e' lateral:   10.30 cm/s  LVOT diam:     1.50 cm   LV E/e' lateral: 10.6 LV SV:         40 LV SV Index:   16 LVOT Area:     1.77 cm  RIGHT VENTRICLE TAPSE (M-mode): 2.7 cm LEFT ATRIUM             Index        RIGHT ATRIUM           Index LA diam:        3.90 cm 1.60 cm/m   RA Area:     18.10 cm LA Vol (A2C):   62.4 ml 25.55 ml/m  RA Volume:   51.30 ml  21.01 ml/m LA Vol (A4C):   36.7 ml 15.03 ml/m LA Biplane Vol: 52.4 ml 21.46 ml/m  AORTIC VALVE AV Area (Vmax):    1.22 cm AV Area (Vmean):   1.23 cm AV Area (VTI):     1.30 cm AV Vmax:           151.00 cm/s AV Vmean:          97.200 cm/s AV VTI:            0.304 m AV Peak Grad:      9.1 mmHg AV Mean Grad:      4.0 mmHg LVOT Vmax:         104.00 cm/s LVOT Vmean:        67.800 cm/s LVOT VTI:          0.224 m LVOT/AV VTI ratio: 0.74  AORTA Ao Root diam: 2.00 cm MITRAL VALVE                 TRICUSPID VALVE MV Area (PHT): 4.39 cm     TR Peak grad:   9.2 mmHg MV Decel Time: 173 msec     TR Vmax:        152.00 cm/s MV E velocity: 109.00 cm/s MV A velocity: 76.60 cm/s   SHUNTS MV E/A ratio:  1.42         Systemic VTI:  0.22 m                             Systemic Diam: 1.50 cm Candee Furbish MD Electronically signed by Candee Furbish MD Signature Date/Time: 07/01/2021/3:40:31 PM    Final      Assessment and Plan:   Chest pain/coronary artery calcification/mild previously described nonobstructive coronary artery disease - We will go ahead and get her set up tomorrow for a coronary CT scan.  Given her BMI of 51, images may be somewhat challenging however given its noninvasive approach and diagnostic utility, I think it makes sense to start here versus cardiac catheterization as she had in 2010 that showed mild nonobstructive disease.  She is willing to proceed. -Nuclear stress test is not a good option for her given attenuation artifact. -She has dealt with chest pain for quite some time, years in fact.  She has had many further evaluations in the past.  Obviously frustrating for her.  She does have a strong family history. -In the meantime, continue to treat with goal-directed medical therapy which includes statin, beta-blocker, blood pressure control, low-dose aspirin.    Hyperlipidemia - LDL 104 on this admission.  Goal should be less than 70.  We will go ahead and increase her atorvastatin to 40 mg, high intensity dose.  She is amenable to this.  Essential hypertension - Overall reasonably controlled here with medications as above.  Morbid obesity - Continue to encourage weight loss, decrease carbohydrates.  Daily exercise.  This can help as well with myofascial pain.    Risk Assessment/Risk Scores:     HEAR Score (for undifferentiated chest pain):   2 2     For questions or updates, please contact [place] Please consult [email or web address] for contact info under     Signed, Candee Furbish, MD  07/03/2021 8:51 AM

## 2021-07-03 NOTE — Progress Notes (Signed)
PROGRESS NOTE  Autumn [name]  DOB: April 23, 1986  PCP: Bo Merino I, NP KCM:[id number]  DOA: 06/30/2021  LOS: 0 days  Hospital Day: 4  Chief Complaint  Patient presents with   Chest Pain   Brief narrative: Autumn [name] is a 35 y.o. 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 Patient presented to the ED on 10/6 with complaint of constant sharp chest pain for more than 24 hours worse with movement and inspiration.  In the ED, patient was hypertensive to 190/100 with normal heart rate and oxygen saturation EKG showed normal sinus rhythm without features of acute ischemic changes Chest x-ray negative for acute cardiopulmonary disease. CBC and BMP unremarkable High sensitive troponin normal x2, BNP normal, D-dimer normal Despite treatment with Tylenol, Lidoderm, fentanyl and Robaxin, patient continued to have chest pain. ED physician discussed the case with cardiology who recommended observation to hospitalist service. See below for details  Subjective: Patient was seen and examined this morning.   Young morbidly obese female.  Sitting up in chair.  Not in distress.  Chest pain intermittent.  Assessment/Plan: Chest pain -In the setting of mild nonobstructive CAD in 2012 and classic risk factors for progressive coronary artery disease -EKG unremarkable.  Troponin normal. -Echocardiogram yesterday showed EF 60 to 05%, normal diastolic parameters, wall motion abnormality could not be read. -ESR and CRP normal. -Continues to have chest pain on deep inspiration. CT angio of chest yesterday showed coronary artery calcification, advanced for her age. -Cardiology consult appreciated.  Noted a plan to obtain coronary CT scan tomorrow.  -Continue aspirin 325 mg daily, Lipitor 10 mg daily and beta-blocker Recent Labs    06/30/21 1739  TROPONINIHS 3   Accelerated hypertension -Blood pressure was elevated to  190/100 in the ED -Home blood pressure regimen include bisoprolol 5 mg daily, amlodipine 10 mg daily, losartan 25 mg daily and HCTZ as needed. -Patient reports compliance to her medications.  Blood pressure on admission was elevated probably because of pain.  Blood pressure in 130s and 140s in last 24 hours.  Type 2 diabetes mellitus -A1c 6.7 on 05/20/2021 -Home meds include Trulicity 6.97 mg weekly, metformin 750 mg twice daily. -Continue the same at home.  Recent Labs  Lab 07/02/21 1138 07/02/21 1618 07/02/21 2134 07/03/21 0801 07/03/21 1149  GLUCAP 122* 103* 148* 155* 142*   Hyperlipidemia -Continue Lipitor  Anxiety  -Continue Buspar and Xanax   Morbid obesity  -Body mass index is 51.65 kg/m. Patient has been advised to make an attempt to improve diet and exercise patterns to aid in weight loss.  Mobility: Encourage ambulation Code Status:   Code Status: Full Code  Nutritional status: Body mass index is 51.65 kg/m.     Diet:  Diet Order             Diet NPO time specified Except for: Sips with Meds  Diet effective midnight           Diet Heart Room service appropriate? Yes; Fluid consistency: Thin  Diet effective now                  DVT prophylaxis: Lovenox subcu enoxaparin (LOVENOX) injection 40 mg Start: 07/01/21 1000   Antimicrobials: None Fluid: None Consultants: Cardiology Family Communication: Sister at bedside  Status is: Observation  Remains inpatient appropriate because: Pending coronary CT scan tomorrow  Dispo: The patient is from: Home  Anticipated d/c is to: Home in 1 to 2 days              Patient currently is not medically stable to d/c.   Difficult to place patient No     Infusions:    Scheduled Meds:  amLODipine  10 mg Oral Daily   aspirin EC  81 mg Oral Daily   atorvastatin  10 mg Oral Daily   bisoprolol  5 mg Oral Daily   busPIRone  10 mg Oral BID   enoxaparin (LOVENOX) injection  40 mg Subcutaneous Q24H    hydrochlorothiazide  25 mg Oral Daily   insulin aspart  0-5 Units Subcutaneous QHS   insulin aspart  0-9 Units Subcutaneous TID WC   lidocaine  1 patch Transdermal Q24H   losartan  25 mg Oral Daily   pantoprazole  40 mg Oral Q1200    Antimicrobials: Anti-infectives (From admission, onward)    None       PRN meds: acetaminophen, ALPRAZolam, methocarbamol, [START ON 07/04/2021] metoprolol tartrate, nitroGLYCERIN, ondansetron (ZOFRAN) IV   Objective: Vitals:   07/03/21 0447 07/03/21 1148  BP: (!) 145/84 134/88  Pulse: 63 (!) 56  Resp: 14 18  Temp: 98.4 F (36.9 C) (!) 97.4 F (36.3 C)  SpO2: 100% 100%    Intake/Output Summary (Last 24 hours) at 07/03/2021 1410 Last data filed at 07/02/2021 1900 Gross per 24 hour  Intake 240 ml  Output --  Net 240 ml   Filed Weights   06/30/21 1030  Weight: (!) 145.2 kg   Weight change:  Body mass index is 51.65 kg/m.   Physical Exam: General exam: Pleasant, young morbidly obese African-American female.  Not in distress Skin: No rashes, lesions or ulcers. HEENT: Atraumatic, normocephalic, no obvious bleeding Lungs: Clear to auscultation bilaterally CVS: Regular rate and rhythm, no murmur GI/Abd soft, distended from obesity, nontender, bowel sound present CNS: Alert, awake, oriented x3 Psychiatry: Mood appropriate Extremities: No pedal edema, no calf redness  Data Review: I have personally reviewed the laboratory data and studies available.  Recent Labs  Lab 06/30/21 1212  WBC 7.5  HGB 12.2  HCT 39.1  MCV 82.3  PLT 335   Recent Labs  Lab 06/30/21 1212  NA 139  K 4.1  CL 107  CO2 26  GLUCOSE 135*  BUN 9  CREATININE 0.65  CALCIUM 9.3    F/u labs ordered Unresulted Labs (From admission, onward)     Start     Ordered   07/08/21 0500  Creatinine, serum  (enoxaparin (LOVENOX)    CrCl >/= 30 ml/min)  Weekly,   R     Comments: while on enoxaparin therapy    07/01/21 0454   07/04/21 0500  CBC with  Differential/Platelet  Tomorrow morning,   R       Question:  Specimen collection method  Answer:  Lab=Lab collect   07/03/21 1410   07/04/21 0174  Basic metabolic panel  Tomorrow morning,   R       Question:  Specimen collection method  Answer:  Lab=Lab collect   07/03/21 1410            Signed, Terrilee Croak, MD [facility] 07/03/2021

## 2021-07-04 ENCOUNTER — Other Ambulatory Visit: Payer: Self-pay | Admitting: Cardiology

## 2021-07-04 ENCOUNTER — Observation Stay (HOSPITAL_COMMUNITY)
Admit: 2021-07-04 | Discharge: 2021-07-04 | Disposition: A | Discharge status: Home / Self Care | Payer: Medicaid Other | Attending: Cardiology | Admitting: Cardiology

## 2021-07-04 ENCOUNTER — Observation Stay (HOSPITAL_COMMUNITY)
Admit: 2021-07-04 | Discharge: 2021-07-04 | Disposition: A | Discharge status: Home / Self Care | Payer: Medicaid Other | Attending: Student | Admitting: Student

## 2021-07-04 DIAGNOSIS — R0789 Other chest pain: Secondary | ICD-10-CM | POA: Diagnosis not present

## 2021-07-04 DIAGNOSIS — R072 Precordial pain: Secondary | ICD-10-CM | POA: Diagnosis not present

## 2021-07-04 DIAGNOSIS — R931 Abnormal findings on diagnostic imaging of heart and coronary circulation: Secondary | ICD-10-CM

## 2021-07-04 LAB — BASIC METABOLIC PANEL
Anion gap: 7 (ref 5–15)
BUN: 12 mg/dL (ref 6–20)
CO2: 27 mmol/L (ref 22–32)
Calcium: 9.1 mg/dL (ref 8.9–10.3)
Chloride: 101 mmol/L (ref 98–111)
Creatinine, Ser: 0.81 mg/dL (ref 0.44–1.00)
GFR, Estimated: >60 mL/min (ref >60)
Glucose, Bld: 149 mg/dL — ABNORMAL HIGH (ref 70–99)
Potassium: 3.4 mmol/L — ABNORMAL LOW (ref 3.5–5.1)
Sodium: 135 mmol/L (ref 135–145)

## 2021-07-04 LAB — CBC WITH DIFFERENTIAL/PLATELET
Abs Immature Granulocytes: 0.02 10*3/uL (ref 0.00–0.07)
Basophils Absolute: 0 10*3/uL (ref 0.0–0.1)
Basophils Relative: 1 %
Eosinophils Absolute: 0.1 10*3/uL (ref 0.0–0.5)
Eosinophils Relative: 2 %
HCT: 38.4 % (ref 36.0–46.0)
Hemoglobin: 12.3 g/dL (ref 12.0–15.0)
Immature Granulocytes: 0 %
Lymphocytes Relative: 54 %
Lymphs Abs: 3.7 10*3/uL (ref 0.7–4.0)
MCH: 25.7 pg — ABNORMAL LOW (ref 26.0–34.0)
MCHC: 32 g/dL (ref 30.0–36.0)
MCV: 80.3 fL (ref 80.0–100.0)
Monocytes Absolute: 0.5 10*3/uL (ref 0.1–1.0)
Monocytes Relative: 7 %
Neutro Abs: 2.4 10*3/uL (ref 1.7–7.7)
Neutrophils Relative %: 36 %
Platelets: 331 10*3/uL (ref 150–400)
RBC: 4.78 MIL/uL (ref 3.87–5.11)
RDW: 14.1 % (ref 11.5–15.5)
WBC: 6.8 10*3/uL (ref 4.0–10.5)
nRBC: 0 % (ref 0.0–0.2)

## 2021-07-04 LAB — GLUCOSE, CAPILLARY
Glucose-Capillary: 154 mg/dL — ABNORMAL HIGH (ref 70–99)
Glucose-Capillary: 126 mg/dL — ABNORMAL HIGH (ref 70–99)
Glucose-Capillary: 162 mg/dL — ABNORMAL HIGH (ref 70–99)

## 2021-07-04 MED ORDER — NITROGLYCERIN 0.4 MG SL SUBL
0.8000 mg | SUBLINGUAL_TABLET | Freq: Once | SUBLINGUAL | Status: AC
  Administered 2021-07-04: 0.8 mg via SUBLINGUAL

## 2021-07-04 MED ORDER — IOHEXOL 350 MG/ML SOLN
95.0000 mL | Freq: Once | INTRAVENOUS | Status: AC | PRN
  Administered 2021-07-04: 95 mL via INTRAVENOUS

## 2021-07-04 NOTE — Progress Notes (Addendum)
Progress Note  Patient Name: Autumn [name] Date of Encounter: 07/04/2021  [facility] Cardiologist: Dr. Rennis Golden in 2017, Dr. Anne Fu this admission - patient states she will switch to Dr. Anne Fu  Subjective   Pt reports 8/10 chest pain. The 2 nitro during CT coronary did temporarily relieve her chest pain, but its back to 8/10. Given this, I talked with her about trying imdur.   Inpatient Medications    Scheduled Meds:  amLODipine  10 mg Oral Daily   aspirin EC  81 mg Oral Daily   atorvastatin  10 mg Oral Daily   bisoprolol  5 mg Oral Daily   busPIRone  10 mg Oral BID   enoxaparin (LOVENOX) injection  40 mg Subcutaneous Q24H   hydrochlorothiazide  25 mg Oral Daily   insulin aspart  0-5 Units Subcutaneous QHS   insulin aspart  0-9 Units Subcutaneous TID WC   lidocaine  1 patch Transdermal Q24H   losartan  25 mg Oral Daily   pantoprazole  40 mg Oral Q1200   Continuous Infusions:  PRN Meds: acetaminophen, ALPRAZolam, methocarbamol, nitroGLYCERIN, ondansetron (ZOFRAN) IV   Vital Signs    Vitals:   07/03/21 2108 07/04/21 0523 07/04/21 0821 07/04/21 0844  BP: 139/74 125/78 129/75 114/74  Pulse: 64 61  (!) 58  Resp: 20 16 (!) 24   Temp: 98.2 F (36.8 C) 97.9 F (36.6 C)    TempSrc: Oral Oral    SpO2: 100% 100%  100%  Weight:      Height:        Intake/Output Summary (Last 24 hours) at 07/04/2021 1108 Last data filed at 07/03/2021 1551 Gross per 24 hour  Intake 600 ml  Output --  Net 600 ml   Last 3 Weights 06/30/2021 05/20/2021 07/07/2020  Weight (lbs) 320 lb 328 lb 0.6 oz 338 lb  Weight (kg) 145.151 kg 148.797 kg 153.316 kg  Some encounter information is confidential and restricted. Go to Review Flowsheets activity to see all data.      Telemetry    Sinus rhythm - sinus bradycardia with HR 50-60s - Personally Reviewed  ECG    No new tracings - Personally Reviewed  Physical Exam   GEN: obese female in no acute distress.   Neck: No  JVD Cardiac: RRR, no murmurs, rubs, or gallops.  Respiratory: Clear to auscultation bilaterally. GI: Soft, nontender, non-distended  MS: No edema; No deformity. Neuro:  Nonfocal  Psych: Normal affect   Labs    High Sensitivity Troponin:   Recent Labs  Lab 06/30/21 1212 06/30/21 1739  TROPONINIHS 2 3     Chemistry Recent Labs  Lab 06/30/21 1212 07/04/21 0501  NA 139 135  K 4.1 3.4*  CL 107 101  CO2 26 27  GLUCOSE 135* 149*  BUN 9 12  CREATININE 0.65 0.81  CALCIUM 9.3 9.1  GFRNONAA >60 >60  ANIONGAP 6 7    Lipids  Recent Labs  Lab 07/01/21 0638  CHOL 165  TRIG 69  HDL 47  LDLCALC 104*  CHOLHDL 3.5    Hematology Recent Labs  Lab 06/30/21 1212 07/04/21 0501  WBC 7.5 6.8  RBC 4.75 4.78  HGB 12.2 12.3  HCT 39.1 38.4  MCV 82.3 80.3  MCH 25.7* 25.7*  MCHC 31.2 32.0  RDW 14.2 14.1  PLT 335 331   Thyroid No results for input(s): TSH, FREET4 in the last 168 hours.  BNP Recent Labs  Lab 06/30/21 1212  BNP 21.8  DDimer  Recent Labs  Lab 07/01/21 0045  DDIMER 0.31     Radiology    No results found.  Cardiac Studies   Coronary CTA - pending read  Patient Profile     35 y.o. female  with a hx of chest pain, hypertension, mitral regurgitation, nonobstructive coronary disease by heart catheterization 2010 who is being seen 07/03/2021 for the evaluation of chest pain  Assessment & Plan    Chest pain - nonobstructive disease by heart cath 2010 (Dr. Sharyn Lull) - has failed multiple anti-anginals in the past - CT coronary - final read pending - GDMT includes ASA, statin, BB - still with 8/10 chest pain - she reported relief after nitro x 2 during CCTA --> will add 30 mg imdur to her regimen to be taken at night   Hyperlipidemia with LDL goal < 70 07/01/2021: Cholesterol 165; HDL 47; LDL Cholesterol 104; Triglycerides 69; VLDL 14 - increased lipitor to 40 mg yesterday - repeat lipids in 6 weeks   Hypertension - medications as  above   Morbid obesity - limits imaging modalities - consider referral to weight loss clinic   If CCTA with no obstructive disease, may discharge home today. I will make cardiology follow up.      For questions or updates, please contact [facility] Please consult [email or web address] for contact info under        Signed, Marcelino Duster, PA  07/04/2021, 11:08 AM    History and all data above reviewed.  Patient examined.  I agree with the findings as above.  She has continued to have chest pain.  No objective evidence of ischemia.  No change in therapy.   The patient exam reveals COR:RRR  ,  Lungs: Clear  ,  Abd: Positive bowel sounds, no rebound no guarding, Ext no edema  .  All available labs, radiology testing, previous records reviewed. Agree with documented assessment and plan.   CT results pending.  If no obstructive CAD then no further plan for in patient cardiac work up.    Fayrene Fearing Torey Reinard  1:11 PM  07/04/2021

## 2021-07-04 NOTE — Discharge Summary (Signed)
Physician Discharge Summary  Autumn [name] ONG:[id number] DOB: [birth year] DOA: 06/30/2021  PCP: Bo Merino I, NP  Admit date: 06/30/2021 Discharge date: 07/05/2021  Admitted From: Home Discharge disposition: Home   Code Status: Full Code   Discharge Diagnosis:   Principal Problem:   Chest pain Active Problems:   Hypertensive urgency   Type 2 diabetes mellitus without complication [facility])   Anxiety    Chief Complaint  Patient presents with   Chest Pain   Brief narrative: Autumn [name] is a 35 y.o. 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 Patient presented to the ED on 10/6 with complaint of constant sharp chest pain for more than 24 hours worse with movement and inspiration.  In the ED, patient was hypertensive to 190/100 with normal heart rate and oxygen saturation EKG showed normal sinus rhythm without features of acute ischemic changes Chest x-ray negative for acute cardiopulmonary disease. CBC and BMP unremarkable High sensitive troponin normal x2, BNP normal, D-dimer normal Despite treatment with Tylenol, Lidoderm, fentanyl and Robaxin, patient continued to have chest pain. ED physician discussed the case with cardiology who recommended observation to hospitalist service. See below for details  Subjective: Patient was seen and examined this morning.   Sitting up in bed.  Not in distress.  She is still complain of chest pain but that seems to respond to nitroglycerin. Mother at bedside. Cardiology follow-up appreciated.  Assessment/Plan: Chest pain -In the setting of mild nonobstructive CAD in 2012 and classic risk factors for progressive coronary artery disease -EKG unremarkable.  Troponin normal. -Echocardiogram showed EF 60 to 44%, normal diastolic parameters, wall motion abnormality could not be read. -ESR and CRP normal. -As she continued to have chest pain on deep  inspiration, cardiology consultation was called.   -Underwent coronary CT on 10/10.  It did not show any obstructive disease.  Cardiology recommended goal-directed medical therapy that includes aspirin 325 mg daily, Lipitor 10 mg daily and beta-blocker.  She also reported chest pain during the procedure and hence was started on Imdur 30 mg daily. -Continue to follow-up with cardiology as an outpatient.  Accelerated hypertension -Blood pressure was elevated to 190/100 in the ED -Home blood pressure regimen include bisoprolol 5 mg daily, amlodipine 10 mg daily, losartan 25 mg daily and HCTZ as needed. -Also added Imdur this admission.  Type 2 diabetes mellitus -A1c 6.7 on 05/20/2021 -Home meds include Trulicity 0.10 mg weekly, metformin 750 mg twice daily. -Continue the same at home.  Recent Labs  Lab 07/04/21 1216 07/04/21 1626 07/04/21 2057 07/05/21 0747 07/05/21 1119  GLUCAP 126* 162* 166* 158* 253*   Hyperlipidemia -Continue Lipitor  Anxiety  -Continue Buspar and Xanax   Morbid obesity  -Body mass index is 51.65 kg/m. Patient has been advised to make an attempt to improve diet and exercise patterns to aid in weight loss.   Allergies as of 07/05/2021   No Known Allergies      Medication List     TAKE these medications    acetaminophen 500 MG tablet Commonly known as: TYLENOL Take 500 mg by mouth every 6 (six) hours as needed for mild pain or headache.   ALPRAZolam 0.5 MG tablet Commonly known as: Xanax Take 1 tablet (0.5 mg total) by mouth at bedtime as needed.   amLODipine 10 MG tablet Commonly known as: NORVASC Take 1 tablet (10 mg total) by mouth daily.   aspirin EC 325 MG  tablet Take 325 mg by mouth daily.   atorvastatin 10 MG tablet Commonly known as: LIPITOR Take 1 tablet (10 mg total) by mouth daily.   bisoprolol 5 MG tablet Commonly known as: ZEBETA Take 1 tablet (5 mg total) by mouth daily.   blood glucose meter kit and supplies Dispense  based on patient and insurance preference. Use up to four times daily as directed. (FOR ICD-9 250.00, 250.01).   busPIRone 10 MG tablet Commonly known as: BUSPAR Take 1 tablet (10 mg total) by mouth 2 (two) times daily.   ferrous sulfate 325 (65 FE) MG tablet Commonly known as: FerrouSul Take 1 tablet (325 mg total) by mouth daily with breakfast.   furosemide 20 MG tablet Commonly known as: Lasix Take 1 tablet (20 mg total) by mouth daily. What changed:  when to take this reasons to take this   hydrochlorothiazide 25 MG tablet Commonly known as: HYDRODIURIL Take 1 tablet (25 mg total) by mouth daily. What changed:  when to take this reasons to take this   isosorbide mononitrate 30 MG 24 hr tablet Commonly known as: IMDUR Take 1 tablet (30 mg total) by mouth daily.   losartan 25 MG tablet Commonly known as: Cozaar Take 1 tablet (25 mg total) by mouth daily.   metFORMIN 750 MG 24 hr tablet Commonly known as: GLUCOPHAGE-XR Take 1 tablet (750 mg total) by mouth 2 (two) times daily.   naproxen 500 MG tablet Commonly known as: NAPROSYN Take 1 tablet (500 mg total) by mouth 2 (two) times daily.   nitroGLYCERIN 0.4 MG SL tablet Commonly known as: NITROSTAT Place 1 tablet (0.4 mg total) under the tongue every 5 (five) minutes as needed for up to 10 days for chest pain.   Trulicity 3.32 RJ/1.8AC Sopn Generic drug: Dulaglutide Inject 0.75 mg into the skin once a week. What changed: additional instructions        Discharge Instructions:  Diet Recommendation: Cardiac/diabetic diet  Follow with Primary MD Bo Merino I, NP in 7 days   Get CBC/BMP checked in next visit within 1 week by PCP or SNF MD. (We routinely change or add medications that can affect your baseline labs and fluid status, therefore we recommend that you get the mentioned basic workup next visit with your PCP, your PCP may decide not to get them or add new tests based on their clinical  decision)  On your next visit with your PCP, please get your medicines reviewed and adjusted.  Please request your PCP  to go over all hospital tests, procedures, radiology results at the follow up, please get all Hospital records sent to your PCP by signing hospital release before you go home.  Activity: As tolerated with Full fall precautions use walker/cane & assistance as needed  Avoid using any recreational substances like cigarette, tobacco, alcohol, or non-prescribed drug.  If you experience worsening of your admission symptoms, develop shortness of breath, life threatening emergency, suicidal or homicidal thoughts you must seek medical attention immediately by calling 911 or calling your MD immediately  if symptoms less severe.  You must read complete instructions/literature along with all the possible adverse reactions/side effects for all the medicines you take and that have been prescribed to you. Take any new medicine only after you have completely understood and accepted all the possible adverse reactions/side effects.   Do not drive, operate heavy machinery, perform activities at heights, swimming or participation in water activities or provide baby sitting services if your were  admitted for syncope or siezures until you have seen by Primary MD or a Neurologist and advised to do so again.  Do not drive when taking Pain medications.  Do not take more than prescribed Pain, Sleep and Anxiety Medications  Wear Seat belts while driving.  Please note You were cared for by a hospitalist during your hospital stay. If you have any questions about your discharge medications or the care you received while you were in the hospital after you are discharged, you can call the unit and asked to speak with the hospitalist on call if the hospitalist that took care of you is not available. Once you are discharged, your primary care physician will handle any further medical issues. Please note that NO  REFILLS for any discharge medications will be authorized once you are discharged, as it is imperative that you return to your primary care physician (or establish a relationship with a primary care physician if you do not have one) for your aftercare needs so that they can reassess your need for medications and monitor your lab values.   Follow ups:    Follow-up Information     Passmore, Jake Church I, NP Follow up.   Specialty: Nurse Practitioner Contact information: [address], [address] [place] [address] [phone]         Jerline Pain, MD Follow up.   Specialty: Cardiology Contact information: [address] [place] [address] [phone]                 Wound care:     Discharge Exam:   Vitals:   07/04/21 0844 07/04/21 1259 07/04/21 2111 07/05/21 0526  BP: 114/74 119/74 (!) 136/95 (!) 141/74  Pulse: (!) 58 (!) 57 68 71  Resp:  18 (!) 22 20  Temp:   98.6 F (37 C) 98.7 F (37.1 C)  TempSrc:   Oral Oral  SpO2: 100% 100% 100% 99%  Weight:      Height:        Body mass index is 51.65 kg/m.  General exam: Pleasant, morbidly obese African-American female.  Not in physical distress Skin: No rashes, lesions or ulcers. HEENT: Atraumatic, normocephalic, no obvious bleeding Lungs: Clear to auscultation bilaterally CVS: Regular rate and rhythm, no murmur GI/Abd soft, distended from obesity, nontender, bowel sound present CNS: Alert, awake, oriented x3 Psychiatry: Mood appropriate Extremities: No edema, no calf tenderness  Time coordinating discharge: 35 minutes   The results of significant diagnostics from this hospitalization (including imaging, microbiology, ancillary and laboratory) are listed below for reference.    Procedures and Diagnostic Studies:   CT CORONARY MORPH W/CTA COR W/SCORE W/CA W/CM &/OR WO/CM  Result Date: 07/04/2021 EXAM: OVER-READ INTERPRETATION  CT CHEST The following report is an over-read performed by  radiologist Dr. Fransico Him of [facility], [place] on 07/04/2021. This over-read does not include interpretation of cardiac or coronary anatomy or pathology. The CLINICAL DATA:  Chest pain EXAM: Cardiac/Coronary CTA TECHNIQUE: A non-contrast, gated CT scan was obtained with axial slices of 3 mm through the heart for calcium scoring. Calcium scoring was performed using the Agatston method. A 120 kV prospective, gated, contrast cardiac scan was obtained. Gantry rotation speed was 250 msecs and collimation was 0.6 mm. Two sublingual nitroglycerin tablets (0.8 mg) were given. The 3D data set was reconstructed in 5% intervals of the 35-75% of the R-R cycle. Diastolic phases were analyzed on a dedicated workstation using MPR, MIP, and VRT modes.  The patient received 95 cc of contrast. FINDINGS: Image quality: Poor. Noise artifact is: Significant related to morbid obesity. Slab artifact is also present. Coronary Arteries:  Normal coronary origin.  Right dominance. Left main: The left main is a large caliber vessel with a normal take off from the left coronary cusp that bifurcates to form a left anterior descending artery and a left circumflex artery. trifurcates into a LAD and LCX. There is no plaque or stenosis. Left anterior descending artery: The LAD gives off 2 patent diagonal branches. There is mild calcified plaque in the proximal LAD with associated stenosis of 25-49%. There is mild calcified plaque in the ostium of the 1st diagonal with 25-49% stenosis. Cannot rule out non calcified plaque in the distal LAD but likely related to slab artifact. Left circumflex artery: The LCX is non-dominant and patent with no evidence of plaque or stenosis. The LCX gives off a patent obtuse marginal branches. Right coronary artery: The RCA is dominant with normal take off from the right coronary cusp. The RCA terminates as a PDA and right posterolateral branch without evidence of plaque or stenosis. There is mild calcified  plaque in the proximal RCA with associated stenosis of 25-49%. Right Atrium: Right atrial size is within normal limits. Right Ventricle: The right ventricular cavity is within normal limits. Left Atrium: Left atrial size is normal in size with no left atrial appendage filling defect. Left Ventricle: The ventricular cavity size is within normal limits. There are no stigmata of prior infarction. There is no abnormal filling defect. Pulmonary arteries: Normal in size without proximal filling defect. Pulmonary veins: Normal pulmonary venous drainage. Pericardium: Normal thickness with no significant effusion or calcium present. Cardiac valves: The aortic valve is trileaflet without significant calcification. The mitral valve is normal structure without significant calcification. Aorta: Normal caliber with no significant disease. Extra-cardiac findings: See attached radiology report for non-cardiac structures. IMPRESSION: 1. Coronary calcium score of 151. This was 99th percentile for age-, sex, and race-matched controls. 2. Normal coronary origin with right dominance. 3. Mild atherosclerosis.  CAD RADS 2. 4. This study has been submitted for FFR flow analysis. RECOMMENDATIONS: 1. CAD-RADS 0: No evidence of CAD (0%). Consider non-atherosclerotic causes of chest pain. 2. CAD-RADS 1: Minimal non-obstructive CAD (0-24%). Consider non-atherosclerotic causes of chest pain. Consider preventive therapy and risk factor modification. 3. CAD-RADS 2: Mild non-obstructive CAD (25-49%). Consider non-atherosclerotic causes of chest pain. Consider preventive therapy and risk factor modification. 4. CAD-RADS 3: Moderate stenosis. Consider symptom-guided anti-ischemic pharmacotherapy as well as risk factor modification per guideline directed care. Additional analysis with CT FFR will be submitted. 5. CAD-RADS 4: Severe stenosis. (70-99% or > 50% left main). Cardiac catheterization or CT FFR is recommended. Consider symptom-guided  anti-ischemic pharmacotherapy as well as risk factor modification per guideline directed care. Invasive coronary angiography recommended with revascularization per published guideline statements. 6. CAD-RADS 5: Total coronary occlusion (100%). Consider cardiac catheterization or viability assessment. Consider symptom-guided anti-ischemic pharmacotherapy as well as risk factor modification per guideline directed care. 7. CAD-RADS N: Non-diagnostic study. Obstructive CAD can't be excluded. Alternative evaluation is recommended. Fransico Him, MD Electronically Signed   By: Fransico Him M.D.   On: 07/04/2021 12:53   CT CORONARY FRACTIONAL FLOW RESERVE DATA PREP  Result Date: 07/05/2021 EXAM: CT FFR ANALYSIS FINDINGS: FFRct analysis was performed on the original cardiac CT angiogram dataset. Diagrammatic representation of the FFRct analysis is provided in a separate PDF document in PACS. This dictation was created using the  PDF document and an interactive 3D model of the results. 3D model is not available in the EMR/PACS. Normal FFR range is >0.80. 1. Left Main:  No significant stenosis. 2. LAD: No significant stenosis. 3. LCX: No significant stenosis. 4. RCA: No significant stenosis. IMPRESSION: 1.  CT FFR analysis didn't show any significant stenosis. 1. LM: No stenosis. FFR LM = 0.99. 2. LAD: No significant stenosis. Proximal FFR = 0.97, Mid FFR = 0.91, Distal FFR = 0.76. 3. LCX: No significant stenosis. Proximal FFR = 0.97, Mid FFR = 0.94, Distal FFR = 0.91. 4. RCA: No significant stenosis. Proximal FFR = 0.98, Mid FFR = 0.84, Distal RCA not modeled. 1. Coronary CTA FFR analysis demonstrates no significant flow limiting lesions. 2.  Recommend medical management. Fransico Him, MD Electronically Signed   By: Fransico Him M.D.   On: 07/05/2021 08:34     Labs:   Basic Metabolic Panel: Recent Labs  Lab 06/30/21 1212 07/04/21 0501  NA 139 135  K 4.1 3.4*  CL 107 101  CO2 26 27  GLUCOSE 135* 149*  BUN  9 12  CREATININE 0.65 0.81  CALCIUM 9.3 9.1   GFR Estimated Creatinine Clearance: 140.7 mL/min (by C-G formula based on SCr of 0.81 mg/dL). Liver Function Tests: No results for input(s): AST, ALT, ALKPHOS, BILITOT, PROT, ALBUMIN in the last 168 hours. No results for input(s): LIPASE, AMYLASE in the last 168 hours. No results for input(s): AMMONIA in the last 168 hours. Coagulation profile No results for input(s): INR, PROTIME in the last 168 hours.  CBC: Recent Labs  Lab 06/30/21 1212 07/04/21 0501  WBC 7.5 6.8  NEUTROABS  --  2.4  HGB 12.2 12.3  HCT 39.1 38.4  MCV 82.3 80.3  PLT 335 331   Cardiac Enzymes: No results for input(s): CKTOTAL, CKMB, CKMBINDEX, TROPONINI in the last 168 hours. BNP: Invalid input(s): POCBNP CBG: Recent Labs  Lab 07/04/21 1216 07/04/21 1626 07/04/21 2057 07/05/21 0747 07/05/21 1119  GLUCAP 126* 162* 166* 158* 253*   D-Dimer No results for input(s): DDIMER in the last 72 hours. Hgb A1c No results for input(s): HGBA1C in the last 72 hours. Lipid Profile No results for input(s): CHOL, HDL, LDLCALC, TRIG, CHOLHDL, LDLDIRECT in the last 72 hours. Thyroid function studies No results for input(s): TSH, T4TOTAL, T3FREE, THYROIDAB in the last 72 hours.  Invalid input(s): FREET3 Anemia work up No results for input(s): VITAMINB12, FOLATE, FERRITIN, TIBC, IRON, RETICCTPCT in the last 72 hours. Microbiology Recent Results (from the past 240 hour(s))  Resp Panel by RT-PCR (Flu A&B, Covid) Nasopharyngeal Swab     Status: None   Collection Time: 07/01/21 12:45 AM   Specimen: Nasopharyngeal Swab; Nasopharyngeal(NP) swabs in vial transport medium  Result Value Ref Range Status   SARS Coronavirus 2 by RT PCR NEGATIVE NEGATIVE Final    Comment: (NOTE) SARS-CoV-2 target nucleic acids are NOT DETECTED.  The SARS-CoV-2 RNA is generally detectable in upper respiratory specimens during the acute phase of infection. The lowest concentration of  SARS-CoV-2 viral copies this assay can detect is 138 copies/mL. A negative result does not preclude SARS-Cov-2 infection and should not be used as the sole basis for treatment or other patient management decisions. A negative result may occur with  improper specimen collection/handling, submission of specimen other than nasopharyngeal swab, presence of viral mutation(s) within the areas targeted by this assay, and inadequate number of viral copies(<138 copies/mL). A negative result must be combined with clinical observations, patient history,  and epidemiological information. The expected result is Negative.  Fact Sheet for Patients:  EntrepreneurPulse.com.au  Fact Sheet for Healthcare Providers:  IncredibleEmployment.be  This test is no t yet approved or cleared by the [place] FDA and  has been authorized for detection and/or diagnosis of SARS-CoV-2 by FDA under an Emergency Use Authorization (EUA). This EUA will remain  in effect (meaning this test can be used) for the duration of the COVID-19 declaration under Section 564(b)(1) of the Act, 21 U.S.C.section 360bbb-3(b)(1), unless the authorization is terminated  or revoked sooner.       Influenza A by PCR NEGATIVE NEGATIVE Final   Influenza B by PCR NEGATIVE NEGATIVE Final    Comment: (NOTE) The Xpert Xpress SARS-CoV-2/FLU/RSV plus assay is intended as an aid in the diagnosis of influenza from Nasopharyngeal swab specimens and should not be used as a sole basis for treatment. Nasal washings and aspirates are unacceptable for Xpert Xpress SARS-CoV-2/FLU/RSV testing.  Fact Sheet for Patients: EntrepreneurPulse.com.au  Fact Sheet for Healthcare Providers: IncredibleEmployment.be  This test is not yet approved or cleared by the [place] FDA and has been authorized for detection and/or diagnosis of SARS-CoV-2 by FDA under an Emergency Use  Authorization (EUA). This EUA will remain in effect (meaning this test can be used) for the duration of the COVID-19 declaration under Section 564(b)(1) of the Act, 21 U.S.C. section 360bbb-3(b)(1), unless the authorization is terminated or revoked.  Performed at [facility], [place] [address], [place], [place] [address]      Signed: Marlowe Aschoff Derek Laughter  [facility] 07/05/2021, 11:38 AM

## 2021-07-05 DIAGNOSIS — R072 Precordial pain: Secondary | ICD-10-CM | POA: Diagnosis not present

## 2021-07-05 DIAGNOSIS — R931 Abnormal findings on diagnostic imaging of heart and coronary circulation: Secondary | ICD-10-CM | POA: Diagnosis not present

## 2021-07-05 DIAGNOSIS — R0789 Other chest pain: Secondary | ICD-10-CM | POA: Diagnosis not present

## 2021-07-05 LAB — GLUCOSE, CAPILLARY
Glucose-Capillary: 166 mg/dL — ABNORMAL HIGH (ref 70–99)
Glucose-Capillary: 158 mg/dL — ABNORMAL HIGH (ref 70–99)
Glucose-Capillary: 253 mg/dL — ABNORMAL HIGH (ref 70–99)

## 2021-07-05 MED ORDER — ISOSORBIDE MONONITRATE ER 30 MG PO TB24: 30.0000 mg | ORAL_TABLET | Freq: Every day | ORAL | 2 refills | Status: DC

## 2021-07-05 MED ORDER — ISOSORBIDE MONONITRATE ER 30 MG PO TB24
30.0000 mg | ORAL_TABLET | Freq: Every day | ORAL | Status: DC
  Administered 2021-07-05: 30 mg via ORAL
  Filled 2021-07-05: qty 1

## 2021-07-05 MED ORDER — NITROGLYCERIN 0.4 MG SL SUBL: 0.4000 mg | SUBLINGUAL_TABLET | SUBLINGUAL | 0 refills | Status: DC | PRN

## 2021-07-05 NOTE — Plan of Care (Signed)
Plan of care reviewed with pt. Pt verbalizes understanding and cooperation with discharge plan and medications. Pt to discharge home with self care and transportation provided by friend/family. Pt safety maintained. Problem: Education: Goal: Knowledge of General Education information will improve Description: Including pain rating scale, medication(s)/side effects and non-pharmacologic comfort measures Outcome: Adequate for Discharge   Problem: Health Behavior/Discharge Planning: Goal: Ability to manage health-related needs will improve Outcome: Adequate for Discharge   Problem: Clinical Measurements: Goal: Ability to maintain clinical measurements within normal limits will improve Outcome: Adequate for Discharge Goal: Will remain free from infection Outcome: Adequate for Discharge Goal: Diagnostic test results will improve Outcome: Adequate for Discharge Goal: Respiratory complications will improve Outcome: Adequate for Discharge Goal: Cardiovascular complication will be avoided Outcome: Adequate for Discharge   Problem: Activity: Goal: Risk for activity intolerance will decrease Outcome: Adequate for Discharge   Problem: Nutrition: Goal: Adequate nutrition will be maintained Outcome: Adequate for Discharge   Problem: Coping: Goal: Level of anxiety will decrease Outcome: Adequate for Discharge   Problem: Elimination: Goal: Will not experience complications related to bowel motility Outcome: Adequate for Discharge Goal: Will not experience complications related to urinary retention Outcome: Adequate for Discharge   Problem: Pain Managment: Goal: General experience of comfort will improve Outcome: Adequate for Discharge   Problem: Safety: Goal: Ability to remain free from injury will improve Outcome: Adequate for Discharge   Problem: Skin Integrity: Goal: Risk for impaired skin integrity will decrease Outcome: Adequate for Discharge

## 2021-07-05 NOTE — Progress Notes (Signed)
Progress Note  Patient Name: Autumn [name] Date of Encounter: 07/05/2021  [facility] Cardiologist: Donato Schultz, MD   Subjective   Reviewed CCTA results with her. She is still having chest pain that seems responsive to nitro. I will add 30 mg imdur.  Inpatient Medications    Scheduled Meds:  amLODipine  10 mg Oral Daily   aspirin EC  81 mg Oral Daily   atorvastatin  10 mg Oral Daily   bisoprolol  5 mg Oral Daily   busPIRone  10 mg Oral BID   enoxaparin (LOVENOX) injection  40 mg Subcutaneous Q24H   hydrochlorothiazide  25 mg Oral Daily   insulin aspart  0-5 Units Subcutaneous QHS   insulin aspart  0-9 Units Subcutaneous TID WC   lidocaine  1 patch Transdermal Q24H   losartan  25 mg Oral Daily   pantoprazole  40 mg Oral Q1200   Continuous Infusions:  PRN Meds: acetaminophen, ALPRAZolam, methocarbamol, nitroGLYCERIN, ondansetron (ZOFRAN) IV   Vital Signs    Vitals:   07/04/21 0844 07/04/21 1259 07/04/21 2111 07/05/21 0526  BP: 114/74 119/74 (!) 136/95 (!) 141/74  Pulse: (!) 58 (!) 57 68 71  Resp:  18 (!) 22 20  Temp:   98.6 F (37 C) 98.7 F (37.1 C)  TempSrc:   Oral Oral  SpO2: 100% 100% 100% 99%  Weight:      Height:       No intake or output data in the 24 hours ending 07/05/21 0938 Last 3 Weights 06/30/2021 05/20/2021 07/07/2020  Weight (lbs) 320 lb 328 lb 0.6 oz 338 lb  Weight (kg) 145.151 kg 148.797 kg 153.316 kg  Some encounter information is confidential and restricted. Go to Review Flowsheets activity to see all data.      Telemetry    Sinus rhythm with HR 60s - Personally Reviewed  ECG    No new tracings - Personally Reviewed  Physical Exam   GEN: obese female in no acute distress.   Neck: No JVD Cardiac: RRR, no murmurs, rubs, or gallops.  Respiratory: Clear to auscultation bilaterally. GI: Soft, nontender, non-distended  MS: No edema; No deformity. Neuro:  Nonfocal  Psych: Normal affect   Labs    High Sensitivity  Troponin:   Recent Labs  Lab 06/30/21 1212 06/30/21 1739  TROPONINIHS 2 3     Chemistry Recent Labs  Lab 06/30/21 1212 07/04/21 0501  NA 139 135  K 4.1 3.4*  CL 107 101  CO2 26 27  GLUCOSE 135* 149*  BUN 9 12  CREATININE 0.65 0.81  CALCIUM 9.3 9.1  GFRNONAA >60 >60  ANIONGAP 6 7    Lipids  Recent Labs  Lab 07/01/21 0638  CHOL 165  TRIG 69  HDL 47  LDLCALC 104*  CHOLHDL 3.5    Hematology Recent Labs  Lab 06/30/21 1212 07/04/21 0501  WBC 7.5 6.8  RBC 4.75 4.78  HGB 12.2 12.3  HCT 39.1 38.4  MCV 82.3 80.3  MCH 25.7* 25.7*  MCHC 31.2 32.0  RDW 14.2 14.1  PLT 335 331   Thyroid No results for input(s): TSH, FREET4 in the last 168 hours.  BNP Recent Labs  Lab 06/30/21 1212  BNP 21.8    DDimer  Recent Labs  Lab 07/01/21 0045  DDIMER 0.31     Radiology    CT CORONARY MORPH W/CTA COR W/SCORE W/CA W/CM &/OR WO/CM  Result Date: 07/04/2021 EXAM: OVER-READ INTERPRETATION  CT CHEST The following report  is an over-read performed by radiologist Dr. Armanda Magic of [facility] on 07/04/2021. This over-read does not include interpretation of cardiac or coronary anatomy or pathology. The CLINICAL DATA:  Chest pain EXAM: Cardiac/Coronary CTA TECHNIQUE: A non-contrast, gated CT scan was obtained with axial slices of 3 mm through the heart for calcium scoring. Calcium scoring was performed using the Agatston method. A 120 kV prospective, gated, contrast cardiac scan was obtained. Gantry rotation speed was 250 msecs and collimation was 0.6 mm. Two sublingual nitroglycerin tablets (0.8 mg) were given. The 3D data set was reconstructed in 5% intervals of the 35-75% of the R-R cycle. Diastolic phases were analyzed on a dedicated workstation using MPR, MIP, and VRT modes. The patient received 95 cc of contrast. FINDINGS: Image quality: Poor. Noise artifact is: Significant related to morbid obesity. Slab artifact is also present. Coronary Arteries:  Normal coronary  origin.  Right dominance. Left main: The left main is a large caliber vessel with a normal take off from the left coronary cusp that bifurcates to form a left anterior descending artery and a left circumflex artery. trifurcates into a LAD and LCX. There is no plaque or stenosis. Left anterior descending artery: The LAD gives off 2 patent diagonal branches. There is mild calcified plaque in the proximal LAD with associated stenosis of 25-49%. There is mild calcified plaque in the ostium of the 1st diagonal with 25-49% stenosis. Cannot rule out non calcified plaque in the distal LAD but likely related to slab artifact. Left circumflex artery: The LCX is non-dominant and patent with no evidence of plaque or stenosis. The LCX gives off a patent obtuse marginal branches. Right coronary artery: The RCA is dominant with normal take off from the right coronary cusp. The RCA terminates as a PDA and right posterolateral branch without evidence of plaque or stenosis. There is mild calcified plaque in the proximal RCA with associated stenosis of 25-49%. Right Atrium: Right atrial size is within normal limits. Right Ventricle: The right ventricular cavity is within normal limits. Left Atrium: Left atrial size is normal in size with no left atrial appendage filling defect. Left Ventricle: The ventricular cavity size is within normal limits. There are no stigmata of prior infarction. There is no abnormal filling defect. Pulmonary arteries: Normal in size without proximal filling defect. Pulmonary veins: Normal pulmonary venous drainage. Pericardium: Normal thickness with no significant effusion or calcium present. Cardiac valves: The aortic valve is trileaflet without significant calcification. The mitral valve is normal structure without significant calcification. Aorta: Normal caliber with no significant disease. Extra-cardiac findings: See attached radiology report for non-cardiac structures. IMPRESSION: 1. Coronary calcium  score of 151. This was 99th percentile for age-, sex, and race-matched controls. 2. Normal coronary origin with right dominance. 3. Mild atherosclerosis.  CAD RADS 2. 4. This study has been submitted for FFR flow analysis. RECOMMENDATIONS: 1. CAD-RADS 0: No evidence of CAD (0%). Consider non-atherosclerotic causes of chest pain. 2. CAD-RADS 1: Minimal non-obstructive CAD (0-24%). Consider non-atherosclerotic causes of chest pain. Consider preventive therapy and risk factor modification. 3. CAD-RADS 2: Mild non-obstructive CAD (25-49%). Consider non-atherosclerotic causes of chest pain. Consider preventive therapy and risk factor modification. 4. CAD-RADS 3: Moderate stenosis. Consider symptom-guided anti-ischemic pharmacotherapy as well as risk factor modification per guideline directed care. Additional analysis with CT FFR will be submitted. 5. CAD-RADS 4: Severe stenosis. (70-99% or > 50% left main). Cardiac catheterization or CT FFR is recommended. Consider symptom-guided anti-ischemic pharmacotherapy as well as risk  factor modification per guideline directed care. Invasive coronary angiography recommended with revascularization per published guideline statements. 6. CAD-RADS 5: Total coronary occlusion (100%). Consider cardiac catheterization or viability assessment. Consider symptom-guided anti-ischemic pharmacotherapy as well as risk factor modification per guideline directed care. 7. CAD-RADS N: Non-diagnostic study. Obstructive CAD can't be excluded. Alternative evaluation is recommended. Armanda Magic, MD Electronically Signed   By: Armanda Magic M.D.   On: 07/04/2021 12:53   CT CORONARY FRACTIONAL FLOW RESERVE DATA PREP  Result Date: 07/05/2021 EXAM: CT FFR ANALYSIS FINDINGS: FFRct analysis was performed on the original cardiac CT angiogram dataset. Diagrammatic representation of the FFRct analysis is provided in a separate PDF document in PACS. This dictation was created using the PDF document and an  interactive 3D model of the results. 3D model is not available in the EMR/PACS. Normal FFR range is >0.80. 1. Left Main:  No significant stenosis. 2. LAD: No significant stenosis. 3. LCX: No significant stenosis. 4. RCA: No significant stenosis. IMPRESSION: 1.  CT FFR analysis didn't show any significant stenosis. 1. LM: No stenosis. FFR LM = 0.99. 2. LAD: No significant stenosis. Proximal FFR = 0.97, Mid FFR = 0.91, Distal FFR = 0.76. 3. LCX: No significant stenosis. Proximal FFR = 0.97, Mid FFR = 0.94, Distal FFR = 0.91. 4. RCA: No significant stenosis. Proximal FFR = 0.98, Mid FFR = 0.84, Distal RCA not modeled. 1. Coronary CTA FFR analysis demonstrates no significant flow limiting lesions. 2.  Recommend medical management. Armanda Magic, MD Electronically Signed   By: Armanda Magic M.D.   On: 07/05/2021 08:34    Cardiac Studies   CT coronary 07/04/21: IMPRESSION: 1.  CT FFR analysis didn't show any significant stenosis.   1. LM: No stenosis. FFR LM = 0.99.   2. LAD: No significant stenosis. Proximal FFR = 0.97, Mid FFR = 0.91, Distal FFR = 0.76.   3. LCX: No significant stenosis. Proximal FFR = 0.97, Mid FFR = 0.94, Distal FFR = 0.91.   4. RCA: No significant stenosis. Proximal FFR = 0.98, Mid FFR = 0.84, Distal RCA not modeled.   1. Coronary CTA FFR analysis demonstrates no significant flow limiting lesions.   2.  Recommend medical management.    Patient Profile     35 y.o. female with a hx of chest pain, hypertension, mitral regurgitation, nonobstructive coronary disease by heart catheterization 2010 who is being seen 07/03/2021 for the evaluation of chest pain  Assessment & Plan    Chest pain Patient had nonobstructive heart disease by catheterization in 2010 performed by Dr. Sharyn Lull.  She has failed multiple antianginals in the past.  She underwent CT coronary yesterday which did not show any obstructive disease.  Goal-directed medical therapy includes aspirin, statin,  beta-blocker.  She reported relief of chest pain after the 2 nitro were administered during the CCTA.  Given this I spoke with her about adding 30 mg of Imdur. She is amenable to this.  Consider noncardiac reasons for her chest pain.    Hyperlipidemia with LDL goal less than 70 07/01/2021: Cholesterol 165; HDL 47; LDL Cholesterol 104; Triglycerides 69; VLDL 14 I increased Lipitor to 40 mg yesterday.  Plan to repeat lipid panel in 6 weeks.   Hypertension Continue amlodipine, bisoprolol, losartan, and new Imdur.  She uses HCTZ at home as needed.   Morbid obesity This limits imaging modalities that we can use.  Consider referral to weight loss clinic.   No further cardiac work-up this admission.  Cardiology  follow up has been arranged.      For questions or updates, please contact [facility] Please consult [email or web address] for contact info under        Signed, Marcelino Duster, PA  07/05/2021, 9:38 AM

## 2021-07-05 NOTE — Progress Notes (Signed)
PROGRESS NOTE  Autumn [name]  DOB: 1985-10-24  PCP: Bo Merino I, NP JZP:[id number]  DOA: 06/30/2021  LOS: 0 days  Hospital Day: 6  Chief Complaint  Patient presents with   Chest Pain   Brief narrative: Autumn [name] is a 35 y.o. 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 Patient presented to the ED on 10/6 with complaint of constant sharp chest pain for more than 24 hours worse with movement and inspiration.  In the ED, patient was hypertensive to 190/100 with normal heart rate and oxygen saturation EKG showed normal sinus rhythm without features of acute ischemic changes Chest x-ray negative for acute cardiopulmonary disease. CBC and BMP unremarkable High sensitive troponin normal x2, BNP normal, D-dimer normal Despite treatment with Tylenol, Lidoderm, fentanyl and Robaxin, patient continued to have chest pain. ED physician discussed the case with cardiology who recommended observation to hospitalist service. See below for details  Subjective: Patient was seen and examined this morning on 10/10. Waiting for coronary CT today.  Assessment/Plan: Chest pain -In the setting of mild nonobstructive CAD in 2012 and classic risk factors for progressive coronary artery disease -EKG unremarkable.  Troponin normal. -Echocardiogram yesterday showed EF 60 to 48%, normal diastolic parameters, wall motion abnormality could not be read. -ESR and CRP normal. -Continues to have chest pain on deep inspiration. CT angio of chest showed coronary artery calcification, advanced for her age. -Cardiology consult appreciated.  Noted a plan to obtain coronary CT scan today..  -Continue aspirin 325 mg daily, Lipitor 10 mg daily and beta-blocker  Accelerated hypertension -Blood pressure was elevated to 190/100 in the ED -Home blood pressure regimen include bisoprolol 5 mg daily, amlodipine 10 mg daily, losartan 25 mg  daily and HCTZ as needed. -Patient reports compliance to her medications.  Blood pressure on admission was elevated probably because of pain.  Blood pressure in 130s and 140s in last 24 hours.  Type 2 diabetes mellitus -A1c 6.7 on 05/20/2021 -Home meds include Trulicity 0.16 mg weekly, metformin 750 mg twice daily. -Currently on sliding scale insulin with Accu-Cheks.  Hyperlipidemia -Continue Lipitor  Anxiety  -Continue Buspar and Xanax   Morbid obesity  -Body mass index is 51.65 kg/m. Patient has been advised to make an attempt to improve diet and exercise patterns to aid in weight loss.  Mobility: Encourage ambulation Code Status:   Code Status: Full Code  Nutritional status: Body mass index is 51.65 kg/m.     Diet:  Diet Order             Diet Heart Room service appropriate? Yes; Fluid consistency: Thin  Diet effective now                  DVT prophylaxis: Lovenox subcu enoxaparin (LOVENOX) injection 40 mg Start: 07/01/21 1000   Antimicrobials: None Fluid: None Consultants: Cardiology Family Communication: Sister at bedside  Status is: Observation  Remains inpatient appropriate because: Pending coronary CT scan today.  Dispo: The patient is from: Home              Anticipated d/c is to: Home today or tomorrow after the report is out.              Patient currently is not medically stable to d/c.   Difficult to place patient No     Infusions:    Scheduled Meds:  amLODipine  10 mg Oral Daily   aspirin EC  81 mg Oral Daily   atorvastatin  10 mg Oral Daily   bisoprolol  5 mg Oral Daily   busPIRone  10 mg Oral BID   enoxaparin (LOVENOX) injection  40 mg Subcutaneous Q24H   hydrochlorothiazide  25 mg Oral Daily   insulin aspart  0-5 Units Subcutaneous QHS   insulin aspart  0-9 Units Subcutaneous TID WC   isosorbide mononitrate  30 mg Oral Daily   lidocaine  1 patch Transdermal Q24H   losartan  25 mg Oral Daily   pantoprazole  40 mg Oral Q1200     Antimicrobials: Anti-infectives (From admission, onward)    None       PRN meds: acetaminophen, ALPRAZolam, methocarbamol, nitroGLYCERIN, ondansetron (ZOFRAN) IV   Objective: Vitals:   07/04/21 2111 07/05/21 0526  BP: (!) 136/95 (!) 141/74  Pulse: 68 71  Resp: (!) 22 20  Temp: 98.6 F (37 C) 98.7 F (37.1 C)  SpO2: 100% 99%   No intake or output data in the 24 hours ending 07/05/21 1126  Filed Weights   06/30/21 1030  Weight: (!) 145.2 kg   Weight change:  Body mass index is 51.65 kg/m.   Physical Exam: General exam: Pleasant, young morbidly obese African-American female.  Not in distress Skin: No rashes, lesions or ulcers. HEENT: Atraumatic, normocephalic, no obvious bleeding Lungs: Clear to auscultation bilaterally CVS: Regular rate and rhythm, no murmur GI/Abd soft, distended from obesity, nontender, bowel sound present CNS: Alert, awake, oriented x3 Psychiatry: Mood appropriate Extremities: No pedal edema, no calf redness  Data Review: I have personally reviewed the laboratory data and studies available.  Recent Labs  Lab 06/30/21 1212 07/04/21 0501  WBC 7.5 6.8  NEUTROABS  --  2.4  HGB 12.2 12.3  HCT 39.1 38.4  MCV 82.3 80.3  PLT 335 331   Recent Labs  Lab 06/30/21 1212 07/04/21 0501  NA 139 135  K 4.1 3.4*  CL 107 101  CO2 26 27  GLUCOSE 135* 149*  BUN 9 12  CREATININE 0.65 0.81  CALCIUM 9.3 9.1    F/u labs ordered Unresulted Labs (From admission, onward)     Start     Ordered   07/08/21 0500  Creatinine, serum  (enoxaparin (LOVENOX)    CrCl >/= 30 ml/min)  Weekly,   R     Comments: while on enoxaparin therapy    07/01/21 0454   07/05/21 1119  Glucose, capillary  Once,   R        07/05/21 1119            Signed, Terrilee Croak, MD [facility] 07/05/2021

## 2021-07-06 ENCOUNTER — Telehealth: Payer: Self-pay

## 2021-07-06 NOTE — Telephone Encounter (Signed)
Transition Care Management Unsuccessful Follow-up Telephone Call  Date of discharge and from where:  07/05/2021 from [facility]  Attempts:  1st Attempt  Reason for unsuccessful TCM follow-up call:  Left voice message

## 2021-07-07 NOTE — Telephone Encounter (Signed)
Transition Care Management Unsuccessful Follow-up Telephone Call  Date of discharge and from where:  07/05/2021 from [facility]  Attempts:  2nd Attempt  Reason for unsuccessful TCM follow-up call:  Left voice message

## 2021-07-08 NOTE — Telephone Encounter (Signed)
Transition Care Management Unsuccessful Follow-up Telephone Call  Date of discharge and from where:  07/05/2021 from [facility] In[facility]  Attempts:  3rd Attempt  Reason for unsuccessful TCM follow-up call:  Unable to reach patient

## 2021-08-08 ENCOUNTER — Encounter: Payer: Self-pay | Admitting: *Deleted

## 2021-08-08 ENCOUNTER — Ambulatory Visit: Payer: Medicaid Other | Admitting: Cardiology

## 2021-08-08 ENCOUNTER — Other Ambulatory Visit: Payer: Self-pay

## 2021-08-08 ENCOUNTER — Encounter: Payer: Self-pay | Admitting: Cardiology

## 2021-08-08 VITALS — BP 140/80 | HR 91 | Ht 66.0 in | Wt 328.0 lb

## 2021-08-08 DIAGNOSIS — I10 Essential (primary) hypertension: Secondary | ICD-10-CM

## 2021-08-08 DIAGNOSIS — I2510 Atherosclerotic heart disease of native coronary artery without angina pectoris: Secondary | ICD-10-CM

## 2021-08-08 DIAGNOSIS — Z79899 Other long term (current) drug therapy: Secondary | ICD-10-CM | POA: Diagnosis not present

## 2021-08-08 DIAGNOSIS — E785 Hyperlipidemia, unspecified: Secondary | ICD-10-CM

## 2021-08-08 DIAGNOSIS — E7800 Pure hypercholesterolemia, unspecified: Secondary | ICD-10-CM | POA: Diagnosis not present

## 2021-08-08 HISTORY — DX: Atherosclerotic heart disease of native coronary artery without angina pectoris: I25.10

## 2021-08-08 NOTE — Patient Instructions (Signed)
Medication Instructions:  Please take Isosorbide at night. Decrease Asprin to 81 mg a day. Continue all other medications as listed.  *If you need a refill on your cardiac medications before your next appointment, please call your pharmacy*  Lab Work: Please have blood work in 2 months (Lipid/ALT)  If you have labs (blood work) drawn today and your tests are completely normal, you will receive your results only by: MyChart Message (if you have MyChart) OR A paper copy in the mail If you have any lab test that is abnormal or we need to change your treatment, we will call you to review the results.  Follow-Up: At [facility], you and your health needs are our priority.  As part of our continuing mission to provide you with exceptional heart care, we have created designated Provider Care Teams.  These Care Teams include your primary Cardiologist (physician) and Advanced Practice Providers (APPs -  Physician Assistants and Nurse Practitioners) who all work together to provide you with the care you need, when you need it.  We recommend signing up for the patient portal called "MyChart".  Sign up information is provided on this After Visit Summary.  MyChart is used to connect with patients for Virtual Visits (Telemedicine).  Patients are able to view lab/test results, encounter notes, upcoming appointments, etc.  Non-urgent messages can be sent to your provider as well.   To learn more about what you can do with MyChart, go to [email or web address].    Your next appointment:   6 month(s)  The format for your next appointment:   In Person  Provider:   Chelsea Aus, PA-C, Ronie Spies, PA-C, Nada Boozer, NP, Jacolyn Reedy, PA-C, Eligha Bridegroom, NP, or Tereso Newcomer, PA-C     Then, Donato Schultz, MD will plan to see you again in 1 year(s).    Thank you for choosing [facility]!!

## 2021-08-08 NOTE — Assessment & Plan Note (Signed)
Nonobstructive CAD seen on coronary CT scan in October 2022.  Statin has been started.  Low-dose aspirin.  Blood pressure control.  Diet, exercise.  She is also on isosorbide 30 mg.  She has been feeling some headache with this.  I asked her to try this in the evening time to see if this helps alleviate her headache.  Ultimately, if headaches continue we may trial discontinuation.

## 2021-08-08 NOTE — Progress Notes (Signed)
Cardiology Office Note:    Date:  08/08/2021   ID:  Autumn [name], DOB 10-16-1985, MRN [id number]  PCP:  Teena Dunk, NP   [facility] Providers Cardiologist:  Candee Furbish, MD     Referring MD: Bo Merino I, NP    History of Present Illness:    Autumn [name] is a 35 y.o. female with a hx of hypertension, dyslipidemia, and diabetes who is coming in today for follow-up.  She was seen in the ED on 06/30/21 for chest pain. She presented to the ED with blood pressure of 190/100, normal heart rate, and normal oxygen saturation. She was treated with acetaminophen, fentanyl, Lidoderm, and Robaxin but continued to complain of chest pain. Cardiology was consulted and overnight observation was recommended with formal consultation if needed.   Today, she is doing well. She is doing better since her ED visit.  She is compliant in taking her prescriptions and is tolerating most of them well. However, she takes Imdur in the morning and develops a bad headache by the end of the day. Typically, she will take Tylenol after the Imdur but still reports the headache in the evening.   After activity like going to work, she reports chest pain and fatigue. She continues to feel chest pain throughout the day since her ED visit. Her last chest pain flair up was last night at 3AM.  She denies any palpitations or shortness of breath. No lightheadedness, syncope, orthopnea, PND, or lower extremity edema.  Past Medical History:  Diagnosis Date   Diabetes mellitus, type II ([place])    Essential hypertension    H/O varicella    Headache    Heart murmur    Heel spur, right 01/2020   Hypertension    stopped meds since pregnant   Leaky heart valve    mild MR by echo   Mini stroke    2018    Obese    Prediabetes    Torn Achilles tendon, right, initial encounter 01/2020   Vitamin D deficiency 02/2020    Past Surgical History:  Procedure Laterality Date   BILATERAL  SALPINGECTOMY Bilateral 11/28/2013   Procedure: BILATERAL SALPINGECTOMY;  Surgeon: Betsy Coder, MD;  Location: [facility];  Service: Obstetrics;  Laterality: Bilateral;  Partial on Left   CESAREAN SECTION  05/14/2012   Procedure: CESAREAN SECTION;  Surgeon: Eldred Manges, MD;  Location: [facility];  Service: Gynecology;  Laterality: N/A;  Primary cesarean section of baby girl    at [place] 8/9   CESAREAN SECTION N/A 11/28/2013   Procedure: CESAREAN SECTION;  Surgeon: Betsy Coder, MD;  Location: [facility];  Service: Obstetrics;  Laterality: N/A;   CESAREAN SECTION      Current Medications: Current Meds  Medication Sig   acetaminophen (TYLENOL) 500 MG tablet Take 500 mg by mouth every 6 (six) hours as needed for mild pain or headache.   ALPRAZolam (XANAX) 0.5 MG tablet Take 1 tablet (0.5 mg total) by mouth at bedtime as needed.   amLODipine (NORVASC) 10 MG tablet Take 1 tablet (10 mg total) by mouth daily.   aspirin EC 81 MG tablet Take 81 mg by mouth daily. Swallow whole.   atorvastatin (LIPITOR) 10 MG tablet Take 1 tablet (10 mg total) by mouth daily.   bisoprolol (ZEBETA) 5 MG tablet Take 1 tablet (5 mg total) by mouth daily.   blood glucose meter kit and supplies Dispense based on patient and insurance preference. Use up  to four times daily as directed. (FOR ICD-9 250.00, 250.01).   busPIRone (BUSPAR) 10 MG tablet Take 1 tablet (10 mg total) by mouth 2 (two) times daily.   Dulaglutide (TRULICITY) 9.23 RA/0.7MA SOPN Inject 0.75 mg into the skin once a week.   ferrous sulfate (FERROUSUL) 325 (65 FE) MG tablet Take 1 tablet (325 mg total) by mouth daily with breakfast.   furosemide (LASIX) 20 MG tablet Take 1 tablet (20 mg total) by mouth daily.   hydrochlorothiazide (HYDRODIURIL) 25 MG tablet Take 1 tablet (25 mg total) by mouth daily.   isosorbide mononitrate (IMDUR) 30 MG 24 hr tablet Take 1 tablet (30 mg total) by mouth daily.   losartan (COZAAR) 25 MG tablet Take 1 tablet (25 mg total) by  mouth daily.   metFORMIN (GLUCOPHAGE-XR) 750 MG 24 hr tablet Take 1 tablet (750 mg total) by mouth 2 (two) times daily.   naproxen (NAPROSYN) 500 MG tablet Take 1 tablet (500 mg total) by mouth 2 (two) times daily.     Allergies:   Patient has no known allergies.   Social History   Socioeconomic History   Marital status: Married    Spouse name: Not on file   Number of children: 2   Years of education: Not on file   Highest education level: Not on file  Occupational History   Not on file  Tobacco Use   Smoking status: Never   Smokeless tobacco: Never  Vaping Use   Vaping Use: Never used  Substance and Sexual Activity   Alcohol use: No   Drug use: No   Sexual activity: Yes    Birth control/protection: None  Other Topics Concern   Not on file  Social History Narrative   ** Merged History Encounter **       Lives with husband and 2 children in a one story home.   Works as an Building control surveyor.   Education: college degree.   Social Determinants of Health   Financial Resource Strain: Not on file  Food Insecurity: Not on file  Transportation Needs: Not on file  Physical Activity: Not on file  Stress: Not on file  Social Connections: Not on file     Family History: The patient's family history includes Anemia in her sister; Breast cancer (age of onset: 60) in her mother; CAD in her father; Depression in her sister; Diabetes in her sister; Diabetes Mellitus II in her mother and sister; Heart attack in her father; Heart disease in her father; Heart failure in her mother; Heart murmur in her daughter and sister; Hyperlipidemia in her father; Hypertension in her mother, sister, and sister; Thyroid disease in her sister. There is no history of Anesthesia problems.  ROS:   Please see the history of present illness.    (+) Chest pain All other systems reviewed and are negative.  EKGs/Labs/Other Studies Reviewed:    The following studies were reviewed today: CT Coronary Morph  w/ Score 07/04/21 IMPRESSION: 1. No significant incidental noncardiac findings are noted. IMPRESSION: 1. Coronary calcium score of 151. This was 99th percentile for age-, sex, and race-matched controls. 2. Normal coronary origin with right dominance. 3. Mild atherosclerosis.  CAD RADS 2. 4. This study has been submitted for FFR flow analysis.  CTA 07/01/21 1. Negative for thoracic aortic aneurysm or dissection. 2. No acute intrathoracic findings. 3. Coronary artery calcifications, advanced for age. The severity of coronary artery disease and any potential stenosis cannot be assessed on this non-gated CT  examination. Assessment for potential risk factor modification, dietary therapy or pharmacologic therapy may be warranted, if clinically indicated. 4. Hepatic steatosis.  Echo 07/01/21  1. Left ventricular ejection fraction, by estimation, is 60 to 65%. The  left ventricle has normal function. Left ventricular endocardial border  not optimally defined to evaluate regional wall motion. Left ventricular  diastolic parameters were normal.   2. Right ventricular systolic function is normal. The right ventricular  size is normal.   3. The mitral valve is normal in structure. No evidence of mitral valve  regurgitation. No evidence of mitral stenosis.   4. The aortic valve is normal in structure. Aortic valve regurgitation is  not visualized. No aortic stenosis is present.   5. The inferior vena cava is normal in size with greater than 50%  respiratory variability, suggesting right atrial pressure of 3 mmHg.   EKG: EKG was not ordered today 06/30/21 (ED): Sinus rhythm  Recent Labs: 05/20/2021: ALT 18; TSH 1.780 06/30/2021: B Natriuretic Peptide 21.8 07/04/2021: BUN 12; Creatinine, Ser 0.81; Hemoglobin 12.3; Platelets 331; Potassium 3.4; Sodium 135  Recent Lipid Panel    Component Value Date/Time   CHOL 165 07/01/2021 0638   CHOL 175 03/09/2020 1007   TRIG 69 07/01/2021 0638   HDL 47  07/01/2021 0638   HDL 48 03/09/2020 1007   CHOLHDL 3.5 07/01/2021 0638   VLDL 14 07/01/2021 0638   LDLCALC 104 (H) 07/01/2021 0638   LDLCALC 112 (H) 03/09/2020 1007     Risk Assessment/Calculations:         Physical Exam:    VS:  BP 140/80 (BP Location: Left Arm, Patient Position: Sitting, Cuff Size: Normal)   Pulse 91   Ht 5' 6"  (1.676 m)   Wt (!) 328 lb (148.8 kg)   SpO2 98%   BMI 52.94 kg/m     Wt Readings from Last 3 Encounters:  08/08/21 (!) 328 lb (148.8 kg)  06/30/21 (!) 320 lb (145.2 kg)  05/20/21 (!) 328 lb 0.6 oz (148.8 kg)     GEN:  Well nourished, well developed in no acute distress HEENT: Normal NECK: No JVD; No carotid bruits LYMPHATICS: No lymphadenopathy CARDIAC: RRR, no murmurs, rubs, gallops RESPIRATORY:  Clear to auscultation without rales, wheezing or rhonchi  ABDOMEN: Soft, non-tender, non-distended MUSCULOSKELETAL:  No edema; No deformity  SKIN: Warm and dry NEUROLOGIC:  Alert and oriented x 3 PSYCHIATRIC:  Normal affect   ASSESSMENT:    1. Hyperlipidemia, unspecified hyperlipidemia type   2. Medication management   3. Coronary artery disease involving native coronary artery of native heart without angina pectoris   4. Essential hypertension   5. Morbid obesity ([place])   6. Pure hypercholesterolemia    PLAN:    Coronary artery disease involving native coronary artery of native heart without angina pectoris Nonobstructive CAD seen on coronary CT scan in October 2022.  Statin has been started.  Low-dose aspirin.  Blood pressure control.  Diet, exercise.  She is also on isosorbide 30 mg.  She has been feeling some headache with this.  I asked her to try this in the evening time to see if this helps alleviate her headache.  Ultimately, if headaches continue we may trial discontinuation.  Essential hypertension Much better control.  Amlodipine 10 mg a day bisoprolol 5 mg a day furosemide 20 mg a day HCTZ 25 mg a day isosorbide 30 mg a day  losartan 25 mg a day.  No change made to medical management.  Morbid obesity ([place]) Continue to encourage decrease carbohydrate, weight loss.  Pure hypercholesterolemia We are going to check her lipid panel in 2 months.  If necessary, increase atorvastatin.   Medication Adjustments/Labs and Tests Ordered: Current medicines are reviewed at length with the patient today.  Concerns regarding medicines are outlined above.  Orders Placed This Encounter  Procedures   ALT   Lipid panel    No orders of the defined types were placed in this encounter.   Patient Instructions  Medication Instructions:  Please take Isosorbide at night. Decrease Asprin to 81 mg a day. Continue all other medications as listed.  *If you need a refill on your cardiac medications before your next appointment, please call your pharmacy*  Lab Work: Please have blood work in 2 months (Lipid/ALT)  If you have labs (blood work) drawn today and your tests are completely normal, you will receive your results only by: [facility] (if you have MyChart) OR A paper copy in the mail If you have any lab test that is abnormal or we need to change your treatment, we will call you to review the results.  Follow-Up: At [facility], you and your health needs are our priority.  As part of our continuing mission to provide you with exceptional heart care, we have created designated Provider Care Teams.  These Care Teams include your primary Cardiologist (physician) and Advanced Practice Providers (APPs -  Physician Assistants and Nurse Practitioners) who all work together to provide you with the care you need, when you need it.  We recommend signing up for the patient portal called "MyChart".  Sign up information is provided on this After Visit Summary.  MyChart is used to connect with patients for Virtual Visits (Telemedicine).  Patients are able to view lab/test results, encounter notes, upcoming appointments, etc.   Non-urgent messages can be sent to your provider as well.   To learn more about what you can do with MyChart, go to [email or web address].    Your next appointment:   6 month(s)  The format for your next appointment:   In Person  Provider:   Robbie Lis, PA-C, Melina Copa, PA-C, Cecilie Kicks, NP, Ermalinda Barrios, PA-C, Christen Bame, NP, or Richardson Dopp, PA-C     Then, Candee Furbish, MD will plan to see you again in 1 year(s).    Thank you for choosing [facility]!!     I,Mykaella Javier,acting as a scribe for Candee Furbish, MD.,have documented all relevant documentation on the behalf of Candee Furbish, MD,as directed by  Candee Furbish, MD while in the presence of Candee Furbish, MD.  I, Candee Furbish, MD, have reviewed all documentation for this visit. The documentation on 08/08/21 for the exam, diagnosis, procedures, and orders are all accurate and complete.   Signed, Candee Furbish, MD  08/08/2021 12:46 PM    [place]

## 2021-08-08 NOTE — Assessment & Plan Note (Signed)
Continue to encourage decrease carbohydrate, weight loss.

## 2021-08-08 NOTE — Assessment & Plan Note (Signed)
We are going to check her lipid panel in 2 months.  If necessary, increase atorvastatin.

## 2021-08-08 NOTE — Assessment & Plan Note (Signed)
Much better control.  Amlodipine 10 mg a day bisoprolol 5 mg a day furosemide 20 mg a day HCTZ 25 mg a day isosorbide 30 mg a day losartan 25 mg a day.  No change made to medical management.

## 2021-10-25 ENCOUNTER — Ambulatory Visit (INDEPENDENT_AMBULATORY_CARE_PROVIDER_SITE_OTHER): Payer: Medicaid Other

## 2021-10-25 ENCOUNTER — Ambulatory Visit
Admission: RE | Admit: 2021-10-25 | Discharge: 2021-10-25 | Disposition: A | Discharge status: Home / Self Care | Payer: Medicaid Other | Source: Ambulatory Visit | Attending: Urgent Care | Admitting: Urgent Care

## 2021-10-25 VITALS — BP 165/113 | HR 89 | Temp 98.2°F | Ht 66.0 in | Wt 320.0 lb

## 2021-10-25 DIAGNOSIS — R519 Headache, unspecified: Secondary | ICD-10-CM | POA: Diagnosis not present

## 2021-10-25 DIAGNOSIS — R059 Cough, unspecified: Secondary | ICD-10-CM

## 2021-10-25 DIAGNOSIS — I10 Essential (primary) hypertension: Secondary | ICD-10-CM | POA: Diagnosis not present

## 2021-10-25 DIAGNOSIS — B349 Viral infection, unspecified: Secondary | ICD-10-CM | POA: Diagnosis not present

## 2021-10-25 DIAGNOSIS — R0602 Shortness of breath: Secondary | ICD-10-CM

## 2021-10-25 DIAGNOSIS — E119 Type 2 diabetes mellitus without complications: Secondary | ICD-10-CM | POA: Diagnosis not present

## 2021-10-25 DIAGNOSIS — R058 Other specified cough: Secondary | ICD-10-CM | POA: Diagnosis not present

## 2021-10-25 MED ORDER — PROMETHAZINE-DM 6.25-15 MG/5ML PO SYRP: 5.0000 mL | ORAL_SOLUTION | Freq: Every evening | ORAL | 0 refills | Status: DC | PRN

## 2021-10-25 MED ORDER — CETIRIZINE HCL 10 MG PO TABS: 10.0000 mg | ORAL_TABLET | Freq: Every day | ORAL | 0 refills | Status: DC

## 2021-10-25 MED ORDER — BENZONATATE 100 MG PO CAPS: 100.0000 mg | ORAL_CAPSULE | Freq: Three times a day (TID) | ORAL | 0 refills | Status: DC | PRN

## 2021-10-25 NOTE — ED Triage Notes (Signed)
Patient c/o non-productive cough x 1 week, body aches, headaches.  Patient did have a stomach virus Thursday and Friday.  Husband did test positive for COVID x 2 weeks ago.  Patient has taken Tylenol, Tylenol Cold, Nyquil.

## 2021-10-25 NOTE — ED Provider Notes (Signed)
[facility]   MRN: [id number] DOB: 28-Jun-1986  Subjective:   Autumn [name] is a 36 y.o. female presenting for 1 week history of persistent dry hacking cough, shortness of breath and feeling winded, body aches, sinus headaches.  Patient did have a stomach virus last week but has since resolved.  No history of asthma.  She is a type II diabetic treated without insulin.  She has been using Tylenol, NyQuil and over-the-counter cold medications.  Her husband did test positive for COVID-19 on 2 weeks ago.  Patient is not a smoker.  No confusion, weakness, numbness or tingling, chest pain.  No history of stroke, MI, heart disease, kidney disease.  No current facility-administered medications for this encounter.  Current Outpatient Medications:    acetaminophen (TYLENOL) 500 MG tablet, Take 500 mg by mouth every 6 (six) hours as needed for mild pain or headache., Disp: , Rfl:    amLODipine (NORVASC) 10 MG tablet, Take 1 tablet (10 mg total) by mouth daily., Disp: 30 tablet, Rfl: 11   aspirin EC 81 MG tablet, Take 81 mg by mouth daily. Swallow whole., Disp: , Rfl:    atorvastatin (LIPITOR) 10 MG tablet, Take 1 tablet (10 mg total) by mouth daily., Disp: 90 tablet, Rfl: 1   bisoprolol (ZEBETA) 5 MG tablet, Take 1 tablet (5 mg total) by mouth daily., Disp: 30 tablet, Rfl: 2   blood glucose meter kit and supplies, Dispense based on patient and insurance preference. Use up to four times daily as directed. (FOR ICD-9 250.00, 250.01)., Disp: 1 each, Rfl: 0   Dulaglutide (TRULICITY) 1.57 WI/2.0BT SOPN, Inject 0.75 mg into the skin once a week., Disp: 0.5 mL, Rfl: 11   furosemide (LASIX) 20 MG tablet, Take 1 tablet (20 mg total) by mouth daily., Disp: 30 tablet, Rfl: 11   hydrochlorothiazide (HYDRODIURIL) 25 MG tablet, Take 1 tablet (25 mg total) by mouth daily., Disp: 30 tablet, Rfl: 11   losartan (COZAAR) 25 MG tablet, Take 1 tablet (25 mg total) by mouth daily., Disp: 30 tablet, Rfl:  11   metFORMIN (GLUCOPHAGE-XR) 750 MG 24 hr tablet, Take 1 tablet (750 mg total) by mouth 2 (two) times daily., Disp: 60 tablet, Rfl: 3   aspirin EC 325 MG tablet, Take 325 mg by mouth daily. (Patient not taking: Reported on 08/08/2021), Disp: , Rfl:    isosorbide mononitrate (IMDUR) 30 MG 24 hr tablet, Take 1 tablet (30 mg total) by mouth daily., Disp: 30 tablet, Rfl: 2   nitroGLYCERIN (NITROSTAT) 0.4 MG SL tablet, Place 1 tablet (0.4 mg total) under the tongue every 5 (five) minutes as needed for up to 10 days for chest pain., Disp: 20 tablet, Rfl: 0   No Known Allergies  Past Medical History:  Diagnosis Date   Diabetes mellitus, type II ([place])    Essential hypertension    H/O varicella    Headache    Heart murmur    Heel spur, right 01/2020   Hypertension    stopped meds since pregnant   Leaky heart valve    mild MR by echo   Mini stroke    2018    Obese    Prediabetes    Torn Achilles tendon, right, initial encounter 01/2020   Vitamin D deficiency 02/2020     Past Surgical History:  Procedure Laterality Date   BILATERAL SALPINGECTOMY Bilateral 11/28/2013   Procedure: BILATERAL SALPINGECTOMY;  Surgeon: Betsy Coder, MD;  Location: [facility];  Service: Obstetrics;  Laterality: Bilateral;  Partial on Left   CESAREAN SECTION  05/14/2012   Procedure: CESAREAN SECTION;  Surgeon: Eldred Manges, MD;  Location: [facility];  Service: Gynecology;  Laterality: N/A;  Primary cesarean section of baby girl    at [place] 8/9   CESAREAN SECTION N/A 11/28/2013   Procedure: CESAREAN SECTION;  Surgeon: Betsy Coder, MD;  Location: [facility];  Service: Obstetrics;  Laterality: N/A;   CESAREAN SECTION      Family History  Problem Relation Age of Onset   Hypertension Mother    Diabetes Mellitus II Mother    Breast cancer Mother 50   Heart failure Mother    CAD Father    Hyperlipidemia Father    Heart disease Father    Heart attack Father    Hypertension Sister    Diabetes Mellitus II  Sister    Diabetes Sister    Thyroid disease Sister    Anemia Sister    Hypertension Sister    Depression Sister    Heart murmur Sister    Heart murmur Daughter    Anesthesia problems Neg Hx     Social History   Tobacco Use   Smoking status: Never   Smokeless tobacco: Never  Vaping Use   Vaping Use: Never used  Substance Use Topics   Alcohol use: No   Drug use: No    ROS   Objective:   Vitals: BP (!) 165/113 (BP Location: Left Arm)    Pulse 89    Temp 98.2 F (36.8 C) (Oral)    Ht _0  (1.676 m)    Wt (!) 320 lb (145.2 kg)    LMP 10/09/2021    SpO2 99%    BMI 51.65 kg/m   Physical Exam Constitutional:      General: She is not in acute distress.    Appearance: Normal appearance. She is well-developed. She is not ill-appearing, toxic-appearing or diaphoretic.  HENT:     Head: Normocephalic and atraumatic.     Nose: Nose normal.     Mouth/Throat:     Mouth: Mucous membranes are moist.  Eyes:     General: No scleral icterus.       Right eye: No discharge.        Left eye: No discharge.     Extraocular Movements: Extraocular movements intact.  Cardiovascular:     Rate and Rhythm: Normal rate.     Heart sounds: No murmur heard.   No friction rub. No gallop.  Pulmonary:     Comments: Decreased lung sounds due to body habitus. Skin:    General: Skin is warm and dry.  Neurological:     General: No focal deficit present.     Mental Status: She is alert and oriented to person, place, and time.     Cranial Nerves: No cranial nerve deficit.     Motor: No weakness.     Coordination: Coordination normal.     Gait: Gait normal.     Deep Tendon Reflexes: Reflexes normal.     Comments: Negative Romberg and pronator drift.  Psychiatric:        Mood and Affect: Mood normal.        Behavior: Behavior normal.    DG Chest 2 View  Result Date: 10/25/2021 CLINICAL DATA:  cough, shob EXAM: CHEST - 2 VIEW COMPARISON:  June 30, 2021. FINDINGS: No consolidation. No  visible pleural effusions or pneumothorax. Cardiomediastinal silhouette is within normal limits and  similar to prior. No change from prior. IMPRESSION: No evidence of acute cardiopulmonary disease. Electronically Signed   By: Margaretha Sheffield M.D.   On: 10/25/2021 13:38    Assessment and Plan :   PDMP not reviewed this encounter.  1. Acute viral syndrome   2. Dry cough   3. Shortness of breath   4. Generalized headaches   5. Essential hypertension   6. Type 2 diabetes mellitus treated without insulin ([place])     COVID testing pending. High suspicion for COVID 19 given her exposure and symptoms. Recommended supportive care. Will defer prednisone course given her blood pressure, no history of asthma or smoking. No signs of an acute encephalopathy. Emphasized need for follow up with her PCP for a recheck on her blood pressure. Counseled patient on potential for adverse effects with medications prescribed/recommended today, ER and return-to-clinic precautions discussed, patient verbalized understanding.    Jaynee Eagles, PA-C 10/25/21 1348

## 2021-10-26 LAB — NOVEL CORONAVIRUS, NAA: SARS-CoV-2, NAA: NOT DETECTED

## 2021-10-26 LAB — SARS-COV-2, NAA 2 DAY TAT

## 2021-11-10 ENCOUNTER — Other Ambulatory Visit: Payer: Medicaid Other

## 2021-12-27 ENCOUNTER — Ambulatory Visit: Payer: Medicaid Other | Admitting: Obstetrics and Gynecology

## 2021-12-27 ENCOUNTER — Encounter: Payer: Self-pay | Admitting: Obstetrics and Gynecology

## 2021-12-27 ENCOUNTER — Ambulatory Visit (HOSPITAL_BASED_OUTPATIENT_CLINIC_OR_DEPARTMENT_OTHER)
Admission: RE | Admit: 2021-12-27 | Discharge: 2021-12-27 | Disposition: A | Discharge status: Home / Self Care | Payer: Medicaid Other | Source: Ambulatory Visit | Attending: Obstetrics and Gynecology | Admitting: Obstetrics and Gynecology

## 2021-12-27 ENCOUNTER — Other Ambulatory Visit (HOSPITAL_COMMUNITY)
Admission: RE | Admit: 2021-12-27 | Discharge: 2021-12-27 | Disposition: A | Discharge status: Home / Self Care | Payer: Medicaid Other | Source: Ambulatory Visit | Attending: Obstetrics and Gynecology | Admitting: Obstetrics and Gynecology

## 2021-12-27 DIAGNOSIS — D252 Subserosal leiomyoma of uterus: Secondary | ICD-10-CM | POA: Diagnosis not present

## 2021-12-27 DIAGNOSIS — R102 Pelvic and perineal pain: Secondary | ICD-10-CM | POA: Insufficient documentation

## 2021-12-27 DIAGNOSIS — Z202 Contact with and (suspected) exposure to infections with a predominantly sexual mode of transmission: Secondary | ICD-10-CM

## 2021-12-27 DIAGNOSIS — Z01419 Encounter for gynecological examination (general) (routine) without abnormal findings: Secondary | ICD-10-CM | POA: Diagnosis not present

## 2021-12-27 NOTE — Patient Instructions (Signed)

## 2021-12-27 NOTE — Progress Notes (Signed)
New Patient is in the office to establish care.  ?Denies any BC ?LMP 12-05-21 with irregular cycles ?Pt reports last pap was a few years ago ?Reports random, intermittent pelvic pain 10/10 that started 1 year ago. Pain will last a week at a time and comes weeks before menstrual cycle is due.  ? ?

## 2021-12-27 NOTE — Progress Notes (Signed)
Autumn [name] is a 36 y.o. G25P2002 female here for a routine annual gynecologic exam.  Current complaints: occ pelvic pain. Some times associated with cycle. Cycles monthly   Denies abnormal vaginal bleeding, discharge, pelvic pain, problems with intercourse or other gynecologic concerns.  ?  ?Gynecologic History ?Patient's last menstrual period was 12/05/2021. ?Contraception: tubal ligation ?Last Pap: several yrs ago. Results were: normal ?Last mammogram: NA.  ? ?Obstetric History ?OB History  ?Gravida Para Term Preterm AB Living  ?_0 0 0 2  ?SAB IAB Ectopic Multiple Live Births  ?0 0 0   2  ?  ?# Outcome Date GA Lbr Len/2nd Weight Sex Delivery Anes PTL Lv  ?2 Term 11/28/13 107w3d 7 lb 1.2 oz (3.21 kg) F CS-LTranv EPI  LIV  ?1 Term 05/14/12 346w2d F CS-LVertical EPI  LIV  ? ? ?Past Medical History:  ?Diagnosis Date  ? Diabetes mellitus, type II (H[facility]ector  ? Essential hypertension   ? H/O varicella   ? Headache   ? Heart murmur   ? Heel spur, right 01/2020  ? Hypertension   ? stopped meds since pregnant  ? Leaky heart valve   ? mild MR by echo  ? Mini stroke   ? 2018   ? Obese   ? Prediabetes   ? Torn Achilles tendon, right, initial encounter 01/2020  ? Vitamin D deficiency 02/2020  ? ? ?Past Surgical History:  ?Procedure Laterality Date  ? BILATERAL SALPINGECTOMY Bilateral 11/28/2013  ? Procedure: BILATERAL SALPINGECTOMY;  Surgeon: NaBetsy CoderMD;  [facility];  Service: Obstetrics;  Laterality: Bilateral;  Partial on Left  ? CESAREAN SECTION  05/14/2012  ? Procedure: CESAREAN SECTION;  Surgeon: VaEldred MangesMD;  [facility];  Service: Gynecology;  Laterality: N/A;  Primary cesarean section of baby girl    at 0533 APGAR 8/9  ? CESAREAN SECTION N/A 11/28/2013  ? Procedure: CESAREAN SECTION;  Surgeon: NaBetsy CoderMD;  [facility];  Service: Obstetrics;  Laterality: N/A;  ? CESAREAN SECTION    ? ? ?Current Outpatient Medications on File Prior to Visit  ?Medication Sig Dispense  Refill  ? acetaminophen (TYLENOL) 500 MG tablet Take 500 mg by mouth every 6 (six) hours as needed for mild pain or headache.    ? amLODipine (NORVASC) 10 MG tablet Take 1 tablet (10 mg total) by mouth daily. 30 tablet 11  ? aspirin EC 81 MG tablet Take 81 mg by mouth daily. Swallow whole.    ? atorvastatin (LIPITOR) 10 MG tablet Take 1 tablet (10 mg total) by mouth daily. 90 tablet 1  ? bisoprolol (ZEBETA) 5 MG tablet Take 1 tablet (5 mg total) by mouth daily. 30 tablet 2  ? blood glucose meter kit and supplies Dispense based on patient and insurance preference. Use up to four times daily as directed. (FOR ICD-9 250.00, 250.01). 1 each 0  ? furosemide (LASIX) 20 MG tablet Take 1 tablet (20 mg total) by mouth daily. 30 tablet 11  ? hydrochlorothiazide (HYDRODIURIL) 25 MG tablet Take 1 tablet (25 mg total) by mouth daily. 30 tablet 11  ? losartan (COZAAR) 25 MG tablet Take 1 tablet (25 mg total) by mouth daily. 30 tablet 11  ? metFORMIN (GLUCOPHAGE-XR) 750 MG 24 hr tablet Take 1 tablet (750 mg total) by mouth 2 (two) times daily. 60 tablet 3  ? isosorbide mononitrate (IMDUR) 30 MG 24 hr tablet Take 1 tablet (30  mg total) by mouth daily. 30 tablet 2  ? nitroGLYCERIN (NITROSTAT) 0.4 MG SL tablet Place 1 tablet (0.4 mg total) under the tongue every 5 (five) minutes as needed for up to 10 days for chest pain. 20 tablet 0  ? ?No current facility-administered medications on file prior to visit.  ? ? ?No Known Allergies ? ?Social History  ? ?Socioeconomic History  ? Marital status: Married  ?  Spouse name: Not on file  ? Number of children: 2  ? Years of education: Not on file  ? Highest education level: Not on file  ?Occupational History  ? Not on file  ?Tobacco Use  ? Smoking status: Never  ? Smokeless tobacco: Never  ?Vaping Use  ? Vaping Use: Never used  ?Substance and Sexual Activity  ? Alcohol use: No  ? Drug use: No  ? Sexual activity: Yes  ?  Birth control/protection: None  ?Other Topics Concern  ? Not on file   ?Social History Narrative  ? ** Merged History Encounter **  ?    ? Lives with husband and 2 children in a one story home.   ?Works as an Building control surveyor.   ?Education: college degree.  ? ?Social Determinants of Health  ? ?Financial Resource Strain: Not on file  ?Food Insecurity: Not on file  ?Transportation Needs: Not on file  ?Physical Activity: Not on file  ?Stress: Not on file  ?Social Connections: Not on file  ?Intimate Partner Violence: Not on file  ? ? ?Family History  ?Problem Relation Age of Onset  ? Hypertension Mother   ? Diabetes Mellitus II Mother   ? Breast cancer Mother 28  ? Heart failure Mother   ? CAD Father   ? Hyperlipidemia Father   ? Heart disease Father   ? Heart attack Father   ? Hypertension Sister   ? Diabetes Mellitus II Sister   ? Diabetes Sister   ? Thyroid disease Sister   ? Anemia Sister   ? Hypertension Sister   ? Depression Sister   ? Heart murmur Sister   ? Heart murmur Daughter   ? Anesthesia problems Neg Hx   ? ? ?The following portions of the patient's history were reviewed and updated as appropriate: allergies, current medications, past family history, past medical history, past social history, past surgical history and problem list. ? ?Review of Systems ?Pertinent items noted in HPI and remainder of comprehensive ROS otherwise negative. ?  ?Objective:  ?BP (!) 166/93   Pulse 83   Ht _0  (1.676 m)   Wt (!) 316 lb 6.4 oz (143.5 kg)   LMP 12/05/2021   BMI 51.07 kg/m?  Chaperone present ?CONSTITUTIONAL: Well-developed, well-nourished female in no acute distress.  ?HENT:  Normocephalic, atraumatic, External right and left ear normal. Oropharynx is clear and moist ?EYES: Conjunctivae and EOM are normal. Pupils are equal, round, and reactive to light. No scleral icterus.  ?NECK: Normal range of motion, supple, no masses.  Normal thyroid.  ?SKIN: Skin is warm and dry. No rash noted. Not diaphoretic. No erythema. No pallor. ?[place]: Alert and oriented to person, place, and  time. Normal reflexes, muscle tone coordination. No cranial nerve deficit noted. ?PSYCHIATRIC: Normal mood and affect. Normal behavior. Normal judgment and thought content. ?CARDIOVASCULAR: Normal heart rate noted, regular rhythm ?RESPIRATORY: Clear to auscultation bilaterally. Effort and breath sounds normal, no problems with respiration noted. ?BREASTS: Symmetric in size. No masses, skin changes, nipple drainage, or lymphadenopathy. ?ABDOMEN: Soft, normal bowel  sounds, no distention noted.  No tenderness, rebound or guarding.  ?PELVIC: Normal appearing external genitalia; normal appearing vaginal mucosa and cervix.  No abnormal discharge noted.  Pap smear obtained.  Normal uterine size, no other palpable masses, no uterine or adnexal tenderness. Limited by pt habitus ?MUSCULOSKELETAL: Normal range of motion. No tenderness.  No cyanosis, clubbing, or edema.  2+ distal pulses. ? ? ?Assessment:  ?Annual gynecologic examination with pap smear ?Pelvic pain ?STD testing ?Plan:  ?Will follow up results of pap smear and manage accordingly. ?Will check GYN U/S. STD testing as per pt request ?Routine preventative health maintenance measures emphasized. ?Please refer to After Visit Summary for other counseling recommendations.  ? ? ?Chancy Milroy, MD, FACOG ?Attending [place] for [employer], [place]  ?

## 2021-12-28 LAB — CYTOLOGY - PAP
Adequacy: ABSENT
Comment: NEGATIVE
Diagnosis: NEGATIVE
Diagnosis: REACTIVE
High risk HPV: NEGATIVE

## 2021-12-28 LAB — CERVICOVAGINAL ANCILLARY ONLY
Bacterial Vaginitis (gardnerella): POSITIVE — AB
Candida Glabrata: NEGATIVE
Candida Vaginitis: NEGATIVE
Chlamydia: NEGATIVE
Comment: NEGATIVE
Comment: NEGATIVE
Comment: NEGATIVE
Comment: NEGATIVE
Comment: NEGATIVE
Comment: NORMAL
Neisseria Gonorrhea: NEGATIVE
Trichomonas: POSITIVE — AB

## 2021-12-28 LAB — HEPB+HEPC+HIV PANEL
HIV Screen 4th Generation wRfx: NONREACTIVE
Hep B C IgM: NEGATIVE
Hep B Core Total Ab: NEGATIVE
Hep B E Ab: NEGATIVE
Hep B E Ag: NEGATIVE
Hep B Surface Ab, Qual: NONREACTIVE
Hep C Virus Ab: NONREACTIVE
Hepatitis B Surface Ag: NEGATIVE

## 2021-12-29 ENCOUNTER — Encounter: Payer: Self-pay | Admitting: Emergency Medicine

## 2021-12-29 ENCOUNTER — Telehealth: Payer: Self-pay | Admitting: Emergency Medicine

## 2021-12-29 MED ORDER — METRONIDAZOLE 500 MG PO TABS: 500.0000 mg | ORAL_TABLET | Freq: Two times a day (BID) | ORAL | 0 refills | Status: DC

## 2021-12-29 NOTE — Telephone Encounter (Signed)
Attempted to contact pt to inform of results and Rx sent to pharmacy.  ?My chart message sent. ?

## 2022-02-24 ENCOUNTER — Ambulatory Visit: Payer: Medicaid Other | Admitting: Nurse Practitioner

## 2022-02-24 NOTE — Progress Notes (Deleted)
Cardiology Office Note:    Date:  02/24/2022   ID:  Autumn [name], DOB 1986-08-28, MRN [id number]  PCP:  Kathrynn Speed, NP   [facility] Providers Cardiologist:  Donato Schultz, MD { Click to update primary MD,subspecialty MD or APP then REFRESH:1}    Referring MD: Kathrynn Speed, NP   Chief Complaint: ***  History of Present Illness:    Autumn [name] is a *** 36 y.o. female with a hx of CAD, hypertension, type 2 diabetes, dyslipidemia, and obesity.   She previously had a heart catheterization in 2010 with nonobstructive CAD performed by Dr. Sharyn Lull.   She was seen in the ED on 06/30/2022 for chest pain with a blood pressure of 190/100 with a normal HR and normal O2 sat. cardiology was consulted and she underwent coronary CTA that revealed coronary calcium score of 151, 99th percentile for age/sex/rate matched control, mild nonobstructive coronary artery disease with no flow-limiting lesions.  Echo revealed LVEF 60 to 65%, normal diastolic parameters, normal RV, no significant valve disease.  She had reported intolerances to antianginals in the past but was agreeable to start Imdur 30 mg daily.   She was last seen in our office on 08/08/2021 by Dr. Anne Fu.  She reported some headaches in the afternoons after taking isosorbide 30 mg in the morning.  Dr. Anne Fu suggested switching this to evening dosing.  She was scheduled for repeat lipid and liver panel 2 months after the visit, however there is no indication that this was completed.  Advised to follow-up in 6 months.  Today, she is here   CMET/Lipid  Past Medical History:  Diagnosis Date   Diabetes mellitus, type II (HCC)    Essential hypertension    H/O varicella    Headache    Heart murmur    Heel spur, right 01/2020   Hypertension    stopped meds since pregnant   Leaky heart valve    mild MR by echo   Mini stroke    2018    Obese    Prediabetes    Torn Achilles tendon, right, initial encounter  01/2020   Vitamin D deficiency 02/2020    Past Surgical History:  Procedure Laterality Date   BILATERAL SALPINGECTOMY Bilateral 11/28/2013   Procedure: BILATERAL SALPINGECTOMY;  Surgeon: Michael Litter, MD;  [facility];  Service: Obstetrics;  Laterality: Bilateral;  Partial on Left   CESAREAN SECTION  05/14/2012   Procedure: CESAREAN SECTION;  Surgeon: Hal Morales, MD;  [facility];  Service: Gynecology;  Laterality: N/A;  Primary cesarean section of baby girl    at 0533 APGAR 8/9   CESAREAN SECTION N/A 11/28/2013   Procedure: CESAREAN SECTION;  Surgeon: Michael Litter, MD;  [facility];  Service: Obstetrics;  Laterality: N/A;   CESAREAN SECTION      Current Medications: No outpatient medications have been marked as taking for the 02/24/22 encounter (Appointment) with Lissa Hoard, Zachary George, NP.     Allergies:   Patient has no known allergies.   Social History   Socioeconomic History   Marital status: Married    Spouse name: Not on file   Number of children: 2   Years of education: Not on file   Highest education level: Not on file  Occupational History   Not on file  Tobacco Use   Smoking status: Never   Smokeless tobacco: Never  Vaping Use   Vaping Use: Never used  Substance and Sexual  Activity   Alcohol use: No   Drug use: No   Sexual activity: Yes    Birth control/protection: None  Other Topics Concern   Not on file  Social History Narrative   ** Merged History Encounter **       Lives with husband and 2 children in a one story home.   Works as an Data processing manager.   Education: college degree.   Social Determinants of Health   Financial Resource Strain: Not on file  Food Insecurity: Not on file  Transportation Needs: Not on file  Physical Activity: Not on file  Stress: Not on file  Social Connections: Not on file     Family History: The patient's ***family history includes Anemia in her sister; Breast cancer (age of onset: 95) in her  mother; CAD in her father; Depression in her sister; Diabetes in her sister; Diabetes Mellitus II in her mother and sister; Heart attack in her father; Heart disease in her father; Heart failure in her mother; Heart murmur in her daughter and sister; Hyperlipidemia in her father; Hypertension in her mother, sister, and sister; Thyroid disease in her sister. There is no history of Anesthesia problems.  ROS:   Please see the history of present illness.    *** All other systems reviewed and are negative.  Labs/Other Studies Reviewed:    The following studies were reviewed today:  Coronary Arteries:  Normal coronary origin.  Right dominance.   Left main: The left main is a large caliber vessel with a normal take off from the left coronary cusp that bifurcates to form a left anterior descending artery and a left circumflex artery. trifurcates into a LAD and LCX. There is no plaque or stenosis.   Left anterior descending artery: The LAD gives off 2 patent diagonal branches. There is mild calcified plaque in the proximal LAD with associated stenosis of 25-49%. There is mild calcified plaque in the ostium of the 1st diagonal with 25-49% stenosis. Cannot rule out non calcified plaque in the distal LAD but likely related to slab artifact.   Left circumflex artery: The LCX is non-dominant and patent with no evidence of plaque or stenosis. The LCX gives off a patent obtuse marginal branches.   Right coronary artery: The RCA is dominant with normal take off from the right coronary cusp. The RCA terminates as a PDA and right posterolateral branch without evidence of plaque or stenosis. There is mild calcified plaque in the proximal RCA with associated stenosis of 25-49%.   Right Atrium: Right atrial size is within normal limits.   Right Ventricle: The right ventricular cavity is within normal limits.   Left Atrium: Left atrial size is normal in size with no left atrial appendage filling  defect.   Left Ventricle: The ventricular cavity size is within normal limits. There are no stigmata of prior infarction. There is no abnormal filling defect.   Pulmonary arteries: Normal in size without proximal filling defect.   Pulmonary veins: Normal pulmonary venous drainage.   Pericardium: Normal thickness with no significant effusion or calcium present.   Cardiac valves: The aortic valve is trileaflet without significant calcification. The mitral valve is normal structure without significant calcification.   Aorta: Normal caliber with no significant disease.   Extra-cardiac findings: See attached radiology report for non-cardiac structures.   IMPRESSION: 1. Coronary calcium score of 151. This was 99th percentile for age-, sex, and race-matched controls.   2. Normal coronary origin with right dominance.  3. Mild atherosclerosis.  CAD RADS 2.   4. This study has been submitted for FFR flow analysis.  Recent Labs: 05/20/2021: ALT 18; TSH 1.780 06/30/2021: B Natriuretic Peptide 21.8 07/04/2021: BUN 12; Creatinine, Ser 0.81; Hemoglobin 12.3; Platelets 331; Potassium 3.4; Sodium 135  Recent Lipid Panel    Component Value Date/Time   CHOL 165 07/01/2021 0638   CHOL 175 03/09/2020 1007   TRIG 69 07/01/2021 0638   HDL 47 07/01/2021 0638   HDL 48 03/09/2020 1007   CHOLHDL 3.5 07/01/2021 0638   VLDL 14 07/01/2021 0638   LDLCALC 104 (H) 07/01/2021 0638   LDLCALC 112 (H) 03/09/2020 1007     Risk Assessment/Calculations:   {Does this patient have ATRIAL FIBRILLATION?:[phone]}       Physical Exam:    VS:  There were no vitals taken for this visit.    Wt Readings from Last 3 Encounters:  12/27/21 (!) 316 lb 6.4 oz (143.5 kg)  10/25/21 (!) 320 lb (145.2 kg)  08/08/21 (!) 328 lb (148.8 kg)     GEN: *** Well nourished, well developed in no acute distress HEENT: Normal NECK: No JVD; No carotid bruits CARDIAC: ***RRR, no murmurs, rubs, gallops RESPIRATORY:   Clear to auscultation without rales, wheezing or rhonchi  ABDOMEN: Soft, non-tender, non-distended MUSCULOSKELETAL:  No edema; No deformity. *** pedal pulses, ***bilaterally SKIN: Warm and dry NEUROLOGIC:  Alert and oriented x 3 PSYCHIATRIC:  Normal affect   EKG:  EKG is *** ordered today.  The ekg ordered today demonstrates ***  Diagnoses:    No diagnosis found. Assessment and Plan:     CAD without angina: Hyperlipidemia LDL goal < 70: Hypertension: Obesity:    {Are you ordering a CV Procedure (e.g. stress test, cath, DCCV, TEE, etc)?   Press F2        :[id number]}    Medication Adjustments/Labs and Tests Ordered: Current medicines are reviewed at length with the patient today.  Concerns regarding medicines are outlined above.  No orders of the defined types were placed in this encounter.  No orders of the defined types were placed in this encounter.   There are no Patient Instructions on file for this visit.   Signed, Levi AlandSwinyer, Lashundra Shiveley M, NP  02/24/2022 11:53 AM    [facility]

## 2022-05-08 ENCOUNTER — Emergency Department (HOSPITAL_BASED_OUTPATIENT_CLINIC_OR_DEPARTMENT_OTHER)
Admission: EM | Admit: 2022-05-08 | Discharge: 2022-05-08 | Disposition: A | Discharge status: Home / Self Care | Payer: Medicaid Other | Attending: Emergency Medicine | Admitting: Emergency Medicine

## 2022-05-08 ENCOUNTER — Other Ambulatory Visit: Payer: Self-pay

## 2022-05-08 ENCOUNTER — Encounter (HOSPITAL_BASED_OUTPATIENT_CLINIC_OR_DEPARTMENT_OTHER): Payer: Self-pay | Admitting: Emergency Medicine

## 2022-05-08 DIAGNOSIS — R519 Headache, unspecified: Secondary | ICD-10-CM | POA: Diagnosis present

## 2022-05-08 DIAGNOSIS — B349 Viral infection, unspecified: Secondary | ICD-10-CM

## 2022-05-08 DIAGNOSIS — Z7982 Long term (current) use of aspirin: Secondary | ICD-10-CM | POA: Diagnosis not present

## 2022-05-08 DIAGNOSIS — U071 COVID-19: Secondary | ICD-10-CM | POA: Diagnosis not present

## 2022-05-08 LAB — RESP PANEL BY RT-PCR (FLU A&B, COVID) ARPGX2
Influenza A by PCR: NEGATIVE
Influenza B by PCR: NEGATIVE
SARS Coronavirus 2 by RT PCR: POSITIVE — AB

## 2022-05-08 MED ORDER — BENZONATATE 100 MG PO CAPS: 100.0000 mg | ORAL_CAPSULE | Freq: Three times a day (TID) | ORAL | 0 refills | Status: DC

## 2022-05-08 MED ORDER — ONDANSETRON 4 MG PO TBDP: ORAL_TABLET | ORAL | 0 refills | Status: DC

## 2022-05-08 NOTE — ED Provider Notes (Signed)
[facility] EMERGENCY DEPT Provider Note   CSN: [id number] Arrival date & time: 05/08/22  1808     History  Chief Complaint  Patient presents with   Generalized Body Aches   Headache    Autumn [name] is a 36 y.o. female.  36 yo F with a chief complaint of cough congestion fever chills myalgias and headache.  This started this morning.  No known sick contacts.   Headache      Home Medications Prior to Admission medications   Medication Sig Start Date End Date Taking? Authorizing Provider  benzonatate (TESSALON) 100 MG capsule Take 1 capsule (100 mg total) by mouth every 8 (eight) hours. 05/08/22  Yes Deno Etienne, DO  ondansetron (ZOFRAN-ODT) 4 MG disintegrating tablet 50m ODT q4 hours prn nausea/vomit 05/08/22  Yes FDeno Etienne DO  acetaminophen (TYLENOL) 500 MG tablet Take 500 mg by mouth every 6 (six) hours as needed for mild pain or headache.    [provider]  amLODipine (NORVASC) 10 MG tablet Take 1 tablet (10 mg total) by mouth daily. 05/20/21   PBo MerinoI, NP  aspirin EC 81 MG tablet Take 81 mg by mouth daily. Swallow whole.    [provider]  atorvastatin (LIPITOR) 10 MG tablet Take 1 tablet (10 mg total) by mouth daily. 05/20/21   Passmore, TJake ChurchI, NP  bisoprolol (ZEBETA) 5 MG tablet Take 1 tablet (5 mg total) by mouth daily. 09/02/18   SAzzie Glatter FNP  blood glucose meter kit and supplies Dispense based on patient and insurance preference. Use up to four times daily as directed. (FOR ICD-9 250.00, 250.01). 05/03/17   JDomenic Polite MD  furosemide (LASIX) 20 MG tablet Take 1 tablet (20 mg total) by mouth daily. 05/20/21   Passmore, TJake ChurchI, NP  hydrochlorothiazide (HYDRODIURIL) 25 MG tablet Take 1 tablet (25 mg total) by mouth daily. 05/20/21   PBo MerinoI, NP  isosorbide mononitrate (IMDUR) 30 MG 24 hr tablet Take 1 tablet (30 mg total) by mouth daily. 07/05/21 10/03/21  DTerrilee Croak MD  losartan (COZAAR) 25  MG tablet Take 1 tablet (25 mg total) by mouth daily. 05/20/21   PBo MerinoI, NP  metFORMIN (GLUCOPHAGE-XR) 750 MG 24 hr tablet Take 1 tablet (750 mg total) by mouth 2 (two) times daily. 05/20/21   [place], TJake ChurchI, NP  metroNIDAZOLE (FLAGYL) 500 MG tablet Take 1 tablet (500 mg total) by mouth 2 (two) times daily. 12/29/21   EChancy Milroy MD  nitroGLYCERIN (NITROSTAT) 0.4 MG SL tablet Place 1 tablet (0.4 mg total) under the tongue every 5 (five) minutes as needed for up to 10 days for chest pain. 07/05/21 07/15/21  DTerrilee Croak MD      Allergies    Patient has no known allergies.    Review of Systems   Review of Systems  Neurological:  Positive for headaches.    Physical Exam Updated Vital Signs BP (!) 152/92 (BP Location: Right Arm)   Pulse 98   Temp 99.2 F (37.3 C) (Oral)   Resp 18   Ht 5' 4"  (1.626 m)   Wt (!) 148.8 kg   SpO2 99%   BMI 56.30 kg/m  Physical Exam Vitals and nursing note reviewed.  Constitutional:      General: She is not in acute distress.    Appearance: She is well-developed. She is not diaphoretic.  HENT:     Head: Normocephalic and atraumatic.     Comments: Swollen  turbinates, posterior nasal drip, no noted sinus ttp,   Eyes:     Pupils: Pupils are equal, round, and reactive to light.  Cardiovascular:     Rate and Rhythm: Normal rate and regular rhythm.     Heart sounds: No murmur heard.    No friction rub. No gallop.  Pulmonary:     Effort: Pulmonary effort is normal.     Breath sounds: No wheezing or rales.  Abdominal:     General: There is no distension.     Palpations: Abdomen is soft.     Tenderness: There is no abdominal tenderness.  Musculoskeletal:        General: No tenderness.     Cervical back: Normal range of motion and neck supple.  Skin:    General: Skin is warm and dry.  Neurological:     Mental Status: She is alert and oriented to person, place, and time.  Psychiatric:        Behavior: Behavior normal.     ED  Results / Procedures / Treatments   Labs (all labs ordered are listed, but only abnormal results are displayed) Labs Reviewed  RESP PANEL BY RT-PCR (FLU A&B, COVID) ARPGX2 - Abnormal; Notable for the following components:      Result Value   SARS Coronavirus 2 by RT PCR POSITIVE (*)    All other components within normal limits    EKG None  Radiology No results found.  Procedures Procedures    Medications Ordered in ED Medications - No data to display  ED Course/ Medical Decision Making/ A&P                           Medical Decision Making Risk Prescription drug management.   5 yoF with a chief complaints of cough congestion fevers chills myalgias.  This been going on for just today.  No known sick contacts.  Her COVID test here is positive which is consistent with her symptoms.  We will treat supportively.  PCP follow-up.  7:09 PM:  I have discussed the diagnosis/risks/treatment options with the patient and family.  Evaluation and diagnostic testing in the emergency department does not suggest an emergent condition requiring admission or immediate intervention beyond what has been performed at this time.  They will follow up with  PCP. We also discussed returning to the ED immediately if new or worsening sx occur. We discussed the sx which are most concerning (e.g., sudden worsening pain, fever, inability to tolerate by mouth) that necessitate immediate return. Medications administered to the patient during their visit and any new prescriptions provided to the patient are listed below.  Medications given during this visit Medications - No data to display   The patient appears reasonably screen and/or stabilized for discharge and I doubt any other medical condition or other [facility] requiring further screening, evaluation, or treatment in the ED at this time prior to discharge.          Final Clinical Impression(s) / ED Diagnoses Final diagnoses:  Viral syndrome    Rx  / DC Orders ED Discharge Orders          Ordered    benzonatate (TESSALON) 100 MG capsule  Every 8 hours        05/08/22 1905    ondansetron (ZOFRAN-ODT) 4 MG disintegrating tablet        05/08/22 1905  Deno Etienne, DO 05/08/22 1909

## 2022-05-08 NOTE — Discharge Instructions (Signed)
Take tylenol 2 pills 4 times a day and motrin 4 pills 3 times a day.  Drink plenty of fluids.  Return for worsening shortness of breath, worsening headache, confusion. Follow up with your family doctor.   

## 2022-05-08 NOTE — ED Triage Notes (Signed)
Pt arrives to ED with c/o headache and body aches. Associated symptoms include runny nose and cough. These symptoms started today.

## 2022-05-31 NOTE — Progress Notes (Signed)
Office Visit    Patient Name: Autumn [name] Date of Encounter: 05/31/2022  Primary Care Provider:  Teena Dunk, NP Primary Cardiologist:  Candee Furbish, MD Primary Electrophysiologist: None  Chief Complaint    Autumn [name] is a 36 y.o. female with PMH of HTN, DM type II, anxiety, HLD, heart murmur, obesity, atypical chest pain with mild nonobstructive CAD who presents today for 35-monthfollow-up of CAD.  Past Medical History    Past Medical History:  Diagnosis Date   [place]    Essential hypertension    H/O varicella    Headache    Heart murmur    Heel spur, right 01/2020   Hypertension    stopped meds since pregnant   Leaky heart valve    mild MR by echo   Mini stroke    2018    Obese    Prediabetes    Torn Achilles tendon, right, initial encounter 01/2020   Vitamin D deficiency 02/2020   Past Surgical History:  Procedure Laterality Date   BILATERAL SALPINGECTOMY Bilateral 11/28/2013   Procedure: BILATERAL SALPINGECTOMY;  Surgeon: NBetsy Coder MD;  [facility];  Service: Obstetrics;  Laterality: Bilateral;  Partial on Left   CESAREAN SECTION  05/14/2012   Procedure: CESAREAN SECTION;  Surgeon: VEldred Manges MD;  [facility];  Service: Gynecology;  Laterality: N/A;  Primary cesarean section of baby girl    at 0[place]8/9   CESAREAN SECTION N/A 11/28/2013   Procedure: CESAREAN SECTION;  Surgeon: NBetsy Coder MD;  [facility];  Service: Obstetrics;  Laterality: N/A;   CESAREAN SECTION      Allergies  No Known Allergies  History of Present Illness    Autumn Kildowis a 36year old female with the above-mentioned past medical history who presents today for 644-monthollow-up of coronary artery disease.  She was originally seen by Dr. TuRadford Paxn 2014 for complaint of chest pain.  She has a long history of atypical chest pain and underwent LHC by Dr. HaTerrence Dupontt age [age]hat was normal.   She was admitted 06/2021 after presenting to the ED with 10 out of 10 chest pain.  Blood pressures were 190/100 and high-sensitivity troponins and BNP were both normal.  Blood pressures improved to the 130s and 140s.  Patient had a CT angio of the chest that showed coronary calcifications with no acute intrathoracic findings or aneurysms.  2D echo was completed that showed EF of 60-65%, with normal LV function, no evidence of mitral stenosis, or aortic stenosis.  Ms. WaVoightresents today for annual follow-up alone.  Since last being seen in the office patient reports she has been doing well from cardiac perspective but has had occasional bouts of chest pain.  She reports that she had to episodes last week that occurred after church that cleared up without using any as needed nitroglycerin.  She reports that her fatigue is getting better but she is still currently working 2 jobs.  She has 2 daughters a 1025ear old and 8-50ear-old as well that are both in extracurricular activities.  Patient denies chest pain currently palpitations, dyspnea, PND, orthopnea, nausea, vomiting, dizziness, syncope, edema, weight gain, or early satiety.  Home Medications    Current Outpatient Medications  Medication Sig Dispense Refill   acetaminophen (TYLENOL) 500 MG tablet Take 500 mg by mouth every 6 (six) hours as needed for mild pain or headache.  amLODipine (NORVASC) 10 MG tablet Take 1 tablet (10 mg total) by mouth daily. 30 tablet 11   aspirin EC 81 MG tablet Take 81 mg by mouth daily. Swallow whole.     atorvastatin (LIPITOR) 10 MG tablet Take 1 tablet (10 mg total) by mouth daily. 90 tablet 1   benzonatate (TESSALON) 100 MG capsule Take 1 capsule (100 mg total) by mouth every 8 (eight) hours. 21 capsule 0   bisoprolol (ZEBETA) 5 MG tablet Take 1 tablet (5 mg total) by mouth daily. 30 tablet 2   blood glucose meter kit and supplies Dispense based on patient and insurance preference. Use up to four times daily as  directed. (FOR ICD-9 250.00, 250.01). 1 each 0   furosemide (LASIX) 20 MG tablet Take 1 tablet (20 mg total) by mouth daily. 30 tablet 11   hydrochlorothiazide (HYDRODIURIL) 25 MG tablet Take 1 tablet (25 mg total) by mouth daily. 30 tablet 11   isosorbide mononitrate (IMDUR) 30 MG 24 hr tablet Take 1 tablet (30 mg total) by mouth daily. 30 tablet 2   losartan (COZAAR) 25 MG tablet Take 1 tablet (25 mg total) by mouth daily. 30 tablet 11   metFORMIN (GLUCOPHAGE-XR) 750 MG 24 hr tablet Take 1 tablet (750 mg total) by mouth 2 (two) times daily. 60 tablet 3   metroNIDAZOLE (FLAGYL) 500 MG tablet Take 1 tablet (500 mg total) by mouth 2 (two) times daily. 14 tablet 0   nitroGLYCERIN (NITROSTAT) 0.4 MG SL tablet Place 1 tablet (0.4 mg total) under the tongue every 5 (five) minutes as needed for up to 10 days for chest pain. 20 tablet 0   ondansetron (ZOFRAN-ODT) 4 MG disintegrating tablet 35m ODT q4 hours prn nausea/vomit 20 tablet 0   No current facility-administered medications for this visit.     Review of Systems  Please see the history of present illness.    (+) Fatigue (+) Occasional chest pain  All other systems reviewed and are otherwise negative except as noted above.  Physical Exam    Wt Readings from Last 3 Encounters:  05/08/22 (!) 328 lb (148.8 kg)  12/27/21 (!) 316 lb 6.4 oz (143.5 kg)  10/25/21 (!) 320 lb (145.2 kg)   VZH:YQMVHwere no vitals filed for this visit.,There is no height or weight on file to calculate BMI.  Constitutional:      Appearance: Obese appearance. Not in distress.  Neck:     Vascular: JVD normal.  Pulmonary:     Effort: Pulmonary effort is normal.     Breath sounds: No wheezing. No rales. Diminished in the bases Cardiovascular:     Normal rate. Regular rhythm. Normal S1. Normal S2.      Murmurs: There is no murmur.  Edema:    Peripheral edema trace amount in left and right lower extremity.  Abdominal:     Palpations: Abdomen is soft non tender.  There is no hepatomegaly.  Skin:    General: Skin is warm and dry.  Neurological:     General: No focal deficit present.     Mental Status: Alert and oriented to person, place and time.     Cranial Nerves: Cranial nerves are intact.  EKG/LABS/Other Studies Reviewed    ECG personally reviewed by me today -sinus rhythm with rate of 73 and normal axis with no acute changes compared to previous EKG    Lab Results  Component Value Date   WBC 6.8 07/04/2021   HGB 12.3 07/04/2021  HCT 38.4 07/04/2021   MCV 80.3 07/04/2021   PLT 331 07/04/2021   Lab Results  Component Value Date   CREATININE 0.81 07/04/2021   BUN 12 07/04/2021   NA 135 07/04/2021   K 3.4 (L) 07/04/2021   CL 101 07/04/2021   CO2 27 07/04/2021   Lab Results  Component Value Date   ALT 18 05/20/2021   AST 13 05/20/2021   ALKPHOS 96 05/20/2021   BILITOT 0.5 05/20/2021   Lab Results  Component Value Date   CHOL 165 07/01/2021   HDL 47 07/01/2021   LDLCALC 104 (H) 07/01/2021   TRIG 69 07/01/2021   CHOLHDL 3.5 07/01/2021    Lab Results  Component Value Date   HGBA1C 6.7 (A) 05/20/2021   HGBA1C 6.7 05/20/2021   HGBA1C 6.7 (A) 05/20/2021   HGBA1C 6.7 05/20/2021    Assessment & Plan    1.  Nonobstructive CAD: -CTA with FFR completed with no significant stenosis in coronary arteries -Continue GDMT with ASA 81 mg, Imdur 30 mg, and Lipitor 10 mg -Today patient denies any chest pain but did endorse 2 episodes last week that occurred with minimal exertion -I advised her to take extra dose of Imdur 30 mg when chest pain occurs.   2.  Hypertension: -Blood pressure today was well controlled at 128/84 -Continue amlodipine 10 mg, bisoprolol 5 mg daily daily and HCTZ 25 mg daily -Patient encouraged to follow low-sodium diet and increase physical activity as tolerated  3.  Pure hypercholesteremia: -Patient's last LDL cholesterol was 104 above goal of less than 70 -We will check CMET and fasting lipids  today -We will plan to increase atorvastatin if LDL is above 70  4.  Morbid obesity: -Patient's current BMI is>30 -Patient advised to start a walking program and also is planning to discuss addition of Ozempic with her PCP.  5.  Obstructive sleep apnea: -Sleep Apnea Evaluation  [facility]  Today's Date: 06/02/2022   Patient Name: Autumn [name]        DOB: 1986-06-26       Height:  5' 4"  (1.626 m)     Weight: (!) 317 lb 3.2 oz (143.9 kg)  BMI: Body mass index is 54.45 kg/m.    Referring Provider: Ambrose Pancoast, NP   STOP-BANG RISK ASSESSMENT       06/02/2022    8:51 AM  STOP-BANG  Do you snore loudly? Yes  Do you often feel tired, fatigued, or sleepy during the daytime? Yes  Has anyone observed you stop breathing during sleep? Yes  Do you have (or are you being treated for) high blood pressure? Yes  Recent BMI (Calculated) 54.42  Is BMI greater than 35 kg/m2? 1=Yes  Age older than 38 years old? 0=No  Gender - Female 0=No      If STOP-BANG Score ?3 OR two clinical symptoms - patient qualifies for WatchPAT (CPT 95800)      Sleep study ordered due to two (2) of the following clinical symptoms/diagnoses:  Excessive daytime sleepiness G47.10  Gastroesophageal reflux K21.9  Nocturia R35.1  Morning Headaches G44.221  Difficulty concentrating R41.840  Memory problems or poor judgment G31.84  Personality changes or irritability R45.4  Loud snoring R06.83  Depression F32.9  Unrefreshed by sleep G47.8  Impotence N52.9  History of high blood pressure R03.0  Insomnia G47.00  Sleep Disordered Breathing or Sleep Apnea ICD G47.33     Disposition: Follow-up with Candee Furbish, MD or APP in 12  months    Medication Adjustments/Labs and Tests Ordered: Current medicines are reviewed at length with the patient today.  Concerns regarding medicines are outlined above.   Signed, Mable Fill, Marissa Nestle, NP 05/31/2022, 6:29 PM [place]

## 2022-06-02 ENCOUNTER — Ambulatory Visit: Payer: Medicaid Other | Attending: Nurse Practitioner | Admitting: Nurse Practitioner

## 2022-06-02 ENCOUNTER — Encounter: Payer: Self-pay | Admitting: Nurse Practitioner

## 2022-06-02 ENCOUNTER — Telehealth: Payer: Self-pay

## 2022-06-02 VITALS — BP 128/84 | HR 73 | Ht 64.0 in | Wt 317.2 lb

## 2022-06-02 DIAGNOSIS — I2510 Atherosclerotic heart disease of native coronary artery without angina pectoris: Secondary | ICD-10-CM

## 2022-06-02 DIAGNOSIS — I10 Essential (primary) hypertension: Secondary | ICD-10-CM

## 2022-06-02 DIAGNOSIS — G4733 Obstructive sleep apnea (adult) (pediatric): Secondary | ICD-10-CM | POA: Diagnosis not present

## 2022-06-02 DIAGNOSIS — E7800 Pure hypercholesterolemia, unspecified: Secondary | ICD-10-CM | POA: Diagnosis not present

## 2022-06-02 NOTE — Telephone Encounter (Signed)
Patient given Itamar device today while in clinic. Waiver signed. Reviewed instructions. App downloaded. Patient aware that she will be contacted once we receive prior auth. Device registered to [employer].

## 2022-06-02 NOTE — Patient Instructions (Signed)
Medication Instructions:   You can take extra imdur one (1) tablet by mouth ( 30 mg ) for increased Chest Pain.  You can take extra Lasix one ( 1) tablet by mouth ( 20 mg ) for weight gain of 3 lbs in 24 hours or 5 lbs in one week.   *If you need a refill on your cardiac medications before your next appointment, please call your pharmacy*   Lab Work:  TODAY!!!! LIPID/CMET  If you have labs (blood work) drawn today and your tests are completely normal, you will receive your results only by: MyChart Message (if you have MyChart) OR A paper copy in the mail If you have any lab test that is abnormal or we need to change your treatment, we will call you to review the results.   Testing/Procedures:  Your physician has recommended that you have a sleep study. This test records several body functions during sleep, including: brain activity, eye movement, oxygen and carbon dioxide blood levels, heart rate and rhythm, breathing rate and rhythm, the flow of air through your mouth and nose, snoring, body muscle movements, and chest and belly movement.   Follow-Up: At [facility], you and your health needs are our priority.  As part of our continuing mission to provide you with exceptional heart care, we have created designated Provider Care Teams.  These Care Teams include your primary Cardiologist (physician) and Advanced Practice Providers (APPs -  Physician Assistants and Nurse Practitioners) who all work together to provide you with the care you need, when you need it.  We recommend signing up for the patient portal called "MyChart".  Sign up information is provided on this After Visit Summary.  MyChart is used to connect with patients for Virtual Visits (Telemedicine).  Patients are able to view lab/test results, encounter notes, upcoming appointments, etc.  Non-urgent messages can be sent to your provider as well.   To learn more about what you can do with MyChart, go to  [email or web address].    Your next appointment:   1 year(s)  The format for your next appointment:   In Person  Provider:   Robin Searing, NP         Other Instructions  Your physician wants you to follow-up in: 1 year with Robin Searing, NP.  You will receive a reminder letter in the mail two months in advance. If you don't receive a letter, please call our office to schedule the follow-up appointment.   Important Information About Sugar

## 2022-06-03 LAB — COMPREHENSIVE METABOLIC PANEL
ALT: 14 IU/L (ref 0–32)
AST: 12 IU/L (ref 0–40)
Albumin/Globulin Ratio: 1.1 — ABNORMAL LOW (ref 1.2–2.2)
Albumin: 3.9 g/dL (ref 3.9–4.9)
Alkaline Phosphatase: 92 IU/L (ref 44–121)
BUN/Creatinine Ratio: 11 (ref 9–23)
BUN: 8 mg/dL (ref 6–20)
Bilirubin Total: 0.5 mg/dL (ref 0.0–1.2)
CO2: 22 mmol/L (ref 20–29)
Calcium: 9.3 mg/dL (ref 8.7–10.2)
Chloride: 103 mmol/L (ref 96–106)
Creatinine, Ser: 0.71 mg/dL (ref 0.57–1.00)
Globulin, Total: 3.6 g/dL (ref 1.5–4.5)
Glucose: 149 mg/dL — ABNORMAL HIGH (ref 70–99)
Potassium: 4.2 mmol/L (ref 3.5–5.2)
Sodium: 138 mmol/L (ref 134–144)
Total Protein: 7.5 g/dL (ref 6.0–8.5)
eGFR: 112 mL/min/{1.73_m2} (ref >59)

## 2022-06-03 LAB — LIPID PANEL
Chol/HDL Ratio: 3 ratio (ref 0.0–4.4)
Cholesterol, Total: 157 mg/dL (ref 100–199)
HDL: 53 mg/dL (ref >39)
LDL Chol Calc (NIH): 88 mg/dL (ref 0–99)
Triglycerides: 84 mg/dL (ref 0–149)
VLDL Cholesterol Cal: 16 mg/dL (ref 5–40)

## 2022-06-05 ENCOUNTER — Telehealth: Payer: Self-pay

## 2022-06-05 DIAGNOSIS — E7800 Pure hypercholesterolemia, unspecified: Secondary | ICD-10-CM

## 2022-06-05 DIAGNOSIS — I2510 Atherosclerotic heart disease of native coronary artery without angina pectoris: Secondary | ICD-10-CM

## 2022-06-05 MED ORDER — ATORVASTATIN CALCIUM 20 MG PO TABS: 20.0000 mg | ORAL_TABLET | Freq: Every day | ORAL | 3 refills | Status: DC

## 2022-06-05 NOTE — Telephone Encounter (Signed)
The patient has been notified of the result and verbalized understanding.  All questions (if any) were answered. Macie Burows, RN 06/05/2022 5:46 PM   FLP, LFT to be drawn on 07/31/22.

## 2022-06-05 NOTE — Telephone Encounter (Signed)
-----   Message from Gaston Islam., NP sent at 06/03/2022 12:47 PM EDT ----- Please let Autumn [name] know that her renal and liver function are both normal.  She has improved also with her LDL cholesterol which is down from 112-88.  We are still above goal however of less than 70.  Plan: -Please increase Lipitor to 20 mg daily and recheck lipids and LFTs in 8 weeks.  -Increase your physical activity to a minimum of 150 minutes/week as tolerated. -Increase your intake of healthy fats such as avocados, salmon, nuts, and olive oil. -Please let me know if you have any additional questions.  Robin Searing, NP

## 2022-06-05 NOTE — Telephone Encounter (Signed)
Called and made the patient aware that she may proceed with the Itamar Home Sleep Study. PIN # provided to the patient. Patient made aware that she will be contacted after the test has been read with the results and any recommendations. Patient verbalized understanding and thanked me for the call.   Pt has been given PIN# [id number]. Pt will do sleep study one night this week.  

## 2022-06-05 NOTE — Telephone Encounter (Signed)
Prior Authorization for [facility] sent to [employer] via Phone. Reference #  NO PA REQ.

## 2022-06-11 ENCOUNTER — Encounter (HOSPITAL_BASED_OUTPATIENT_CLINIC_OR_DEPARTMENT_OTHER): Payer: Medicaid Other | Admitting: Cardiology

## 2022-06-11 DIAGNOSIS — G4733 Obstructive sleep apnea (adult) (pediatric): Secondary | ICD-10-CM

## 2022-06-12 ENCOUNTER — Ambulatory Visit: Payer: Medicaid Other | Attending: Nurse Practitioner

## 2022-06-12 DIAGNOSIS — G4733 Obstructive sleep apnea (adult) (pediatric): Secondary | ICD-10-CM

## 2022-06-12 DIAGNOSIS — I2510 Atherosclerotic heart disease of native coronary artery without angina pectoris: Secondary | ICD-10-CM

## 2022-06-12 DIAGNOSIS — E7800 Pure hypercholesterolemia, unspecified: Secondary | ICD-10-CM

## 2022-06-12 DIAGNOSIS — I10 Essential (primary) hypertension: Secondary | ICD-10-CM

## 2022-06-12 NOTE — Procedures (Signed)
   Patient Information Study Date: 06/11/22 Patient Name: Autumn [name] Patient ID: [id number] Birth Date: 07-Feb-1986 Age: 36 Gender: Female BMI: 54.2 (W=317 lb, H=5' 4'') Stopbang: 3 Referring Physician: Ambrose Pancoast, NP  TEST DESCRIPTION: Home sleep apnea testing was completed using the WatchPat, a Type 1 device, utilizing peripheral arterial tonometry (PAT), chest movement, actigraphy, pulse oximetry, pulse rate, body position and snore. AHI was calculated with apnea and hypopnea using valid sleep time as the denominator. RDI includes apneas, hypopneas, and RERAs. The data acquired and the scoring of sleep and all associated events were performed in accordance with the recommended standards and specifications as outlined in the AASM Manual for the Scoring of Sleep and Associated Events 2.2.0 (2015).   FINDINGS:  1. Very Mild Obstructive Sleep Apnea with AHI 5.2/hr.  2. No Central Sleep Apnea with pAHIc 0/hr.  3. Oxygen desaturations as low as 88%.  4. Minimal snoring was present. O2 sats were < 88% for 0 min.  5. Total sleep time was 6 hrs and 20 min.  6. 24.9% of total sleep time was spent in REM sleep.  7. Normal sleep onset latency at 17 min.  8. Shortened REM sleep onset latency at 62 min.  9. Total awakenings were 8.   DIAGNOSIS: Mild Obstructive Sleep Apnea (G47.33)  RECOMMENDATIONS:   1.  Clinical correlation of these findings is necessary.  The decision to treat obstructive sleep apnea (OSA) is usually based on the presence of apnea symptoms or the presence of associated medical conditions such as Hypertension, Congestive Heart Failure, Atrial Fibrillation or Obesity.  The most common symptoms of OSA are snoring, gasping for breath while sleeping, daytime sleepiness and fatigue.   2.  Initiating apnea therapy is recommended given the presence of symptoms and/or associated conditions. Recommend proceeding with one of the following:     a.  Auto-CPAP therapy with a pressure  range of 5-20cm H2O.     b.  An oral appliance (OA) that can be obtained from certain dentists with expertise in sleep medicine.  These are primarily of use in non-obese patients with mild and moderate disease.     c.  An ENT consultation which may be useful to look for specific causes of obstruction and possible treatment options.     d.  If patient is intolerant to PAP therapy, consider referral to ENT for evaluation for hypoglossal nerve stimulator.   3.  Close follow-up is necessary to ensure success with CPAP or oral appliance therapy for maximum benefit.  4.  A follow-up oximetry study on CPAP is recommended to assess the adequacy of therapy and determine the need for supplemental oxygen or the potential need for Bi-level therapy.  An arterial blood gas to determine the adequacy of baseline ventilation and oxygenation should also be considered.  5.  Healthy sleep recommendations include:  adequate nightly sleep (normal 7-9 hrs/night), avoidance of caffeine after noon and alcohol near bedtime, and maintaining a sleep environment that is cool, dark and quiet.  6.  Weight loss for overweight patients is recommended.  Even modest amounts of weight loss can significantly improve the severity of sleep apnea.  7.  Snoring recommendations include:  weight loss where appropriate, side sleeping, and avoidance of alcohol before bed.  8.  Operation of motor vehicle should be avoided when sleepy.  Signature: Fransico Him, MD; [facility]; [place], [place] Board of Sleep Medicine Electronically Signed: 06/12/22

## 2022-06-16 ENCOUNTER — Telehealth: Payer: Self-pay | Admitting: *Deleted

## 2022-06-16 NOTE — Telephone Encounter (Signed)
The patient has been notified of the result and verbalized understanding.  All questions (if any) were answered. Autumn [name], [place] 06/16/2022 3:55 PM  Pt is aware and agreeable to results.

## 2022-06-16 NOTE — Telephone Encounter (Signed)
-----   Message from Lauralee Evener, [place] sent at 06/12/2022  2:36 PM EDT -----  ----- Message ----- From: Sueanne Margarita, MD Sent: 06/12/2022   2:01 PM EDT To: Cv Div Sleep Studies  Very minimal OSA - recommend weight loss

## 2022-07-31 ENCOUNTER — Other Ambulatory Visit: Payer: Medicaid Other

## 2022-09-19 DIAGNOSIS — Z20822 Contact with and (suspected) exposure to covid-19: Secondary | ICD-10-CM | POA: Diagnosis not present

## 2022-09-19 DIAGNOSIS — J101 Influenza due to other identified influenza virus with other respiratory manifestations: Secondary | ICD-10-CM | POA: Diagnosis not present

## 2022-09-19 DIAGNOSIS — R509 Fever, unspecified: Secondary | ICD-10-CM | POA: Diagnosis not present

## 2022-09-20 ENCOUNTER — Other Ambulatory Visit: Payer: Self-pay

## 2022-10-16 IMAGING — US US PELVIS COMPLETE WITH TRANSVAGINAL
1 series · 14 of 25 positions shown · non-contrast
Comparison: 11/09/2018

CLINICAL DATA: R 10.2, pelvic pain for 1 year, LMP 12/05/2021,
history of Caesarean section and tubal ligation, G4P2



[Series 1: us pelvic complete with transvaginal · 14 of 48 slices shown]
[im 1/48]
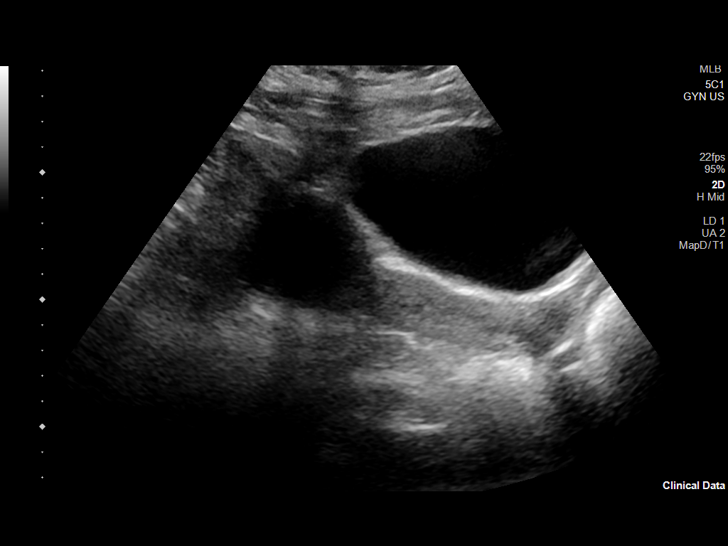
[im 4/48]
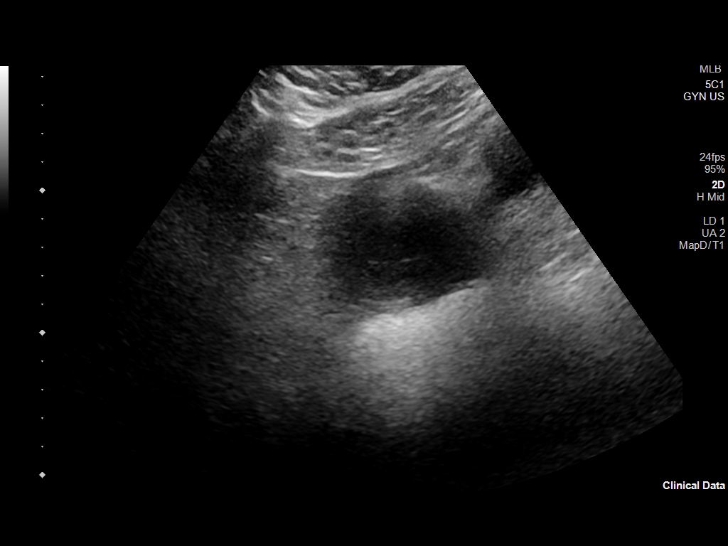
[im 8/48]
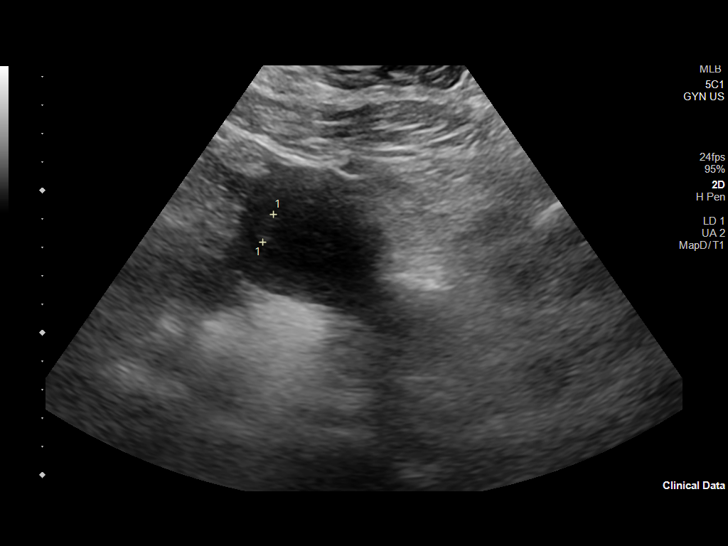
[im 12/48]
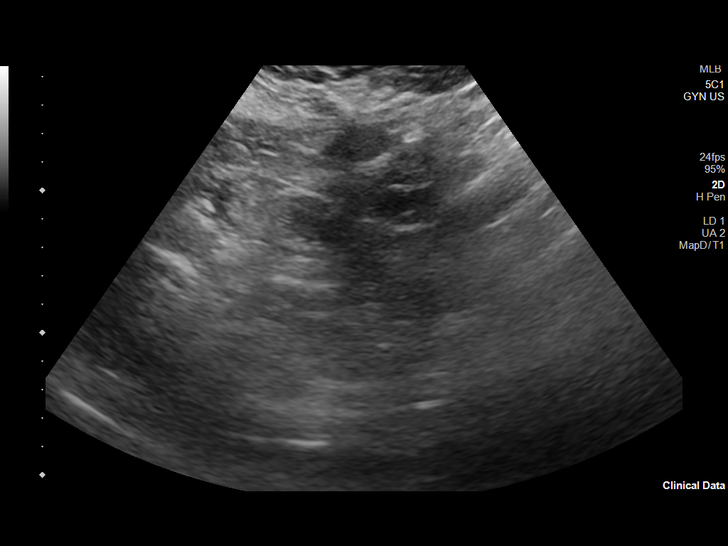
[im 16/48]
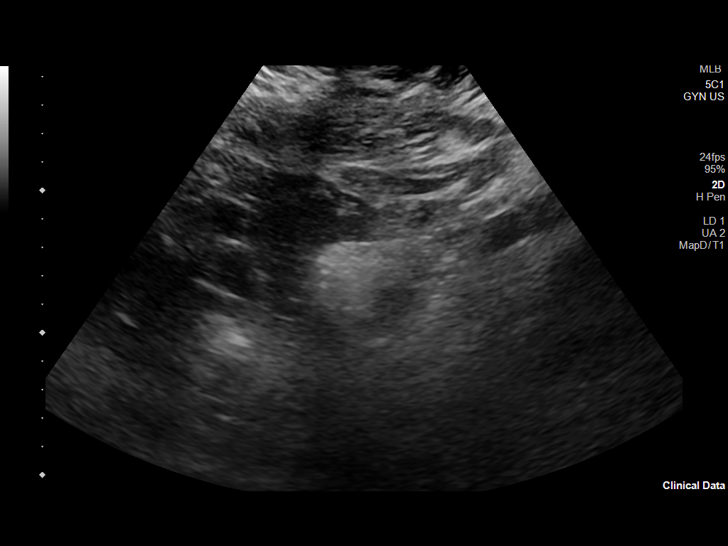
[im 18/48]
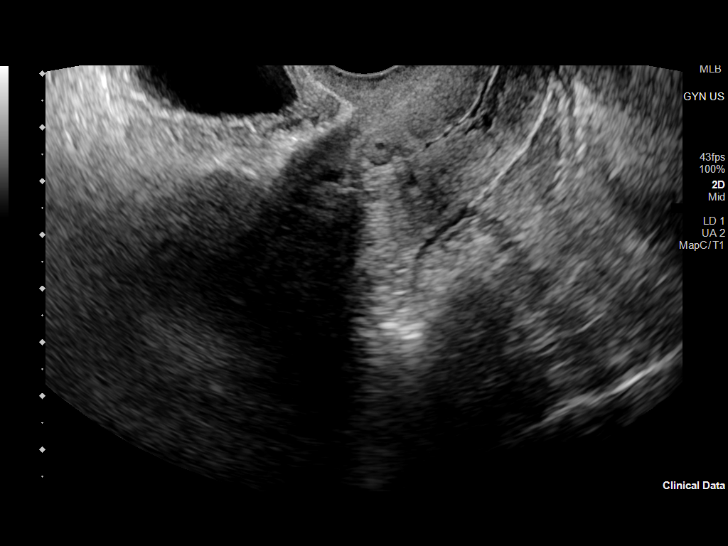
[im 22/48]
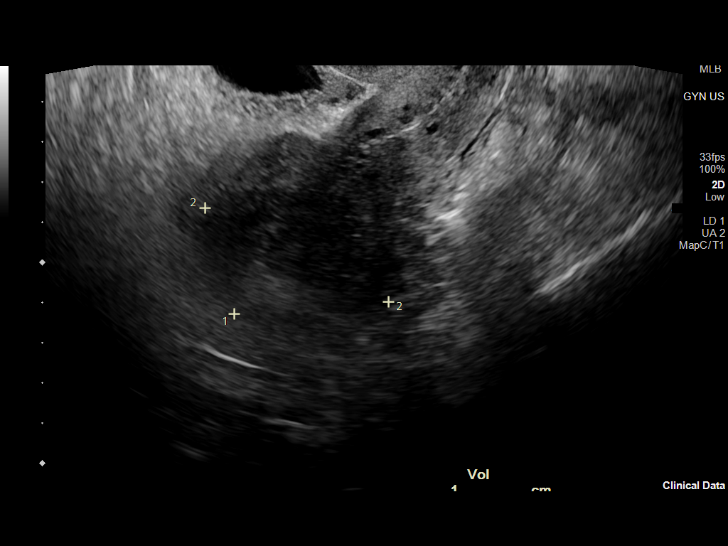
[im 26/48]
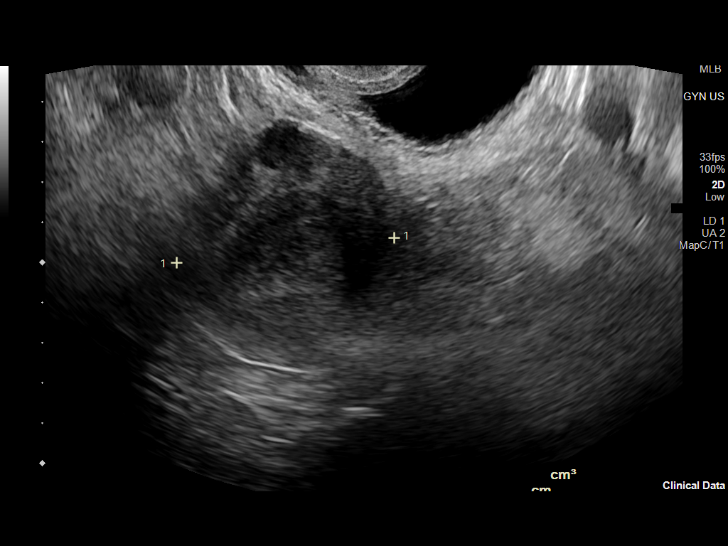
[im 30/48]
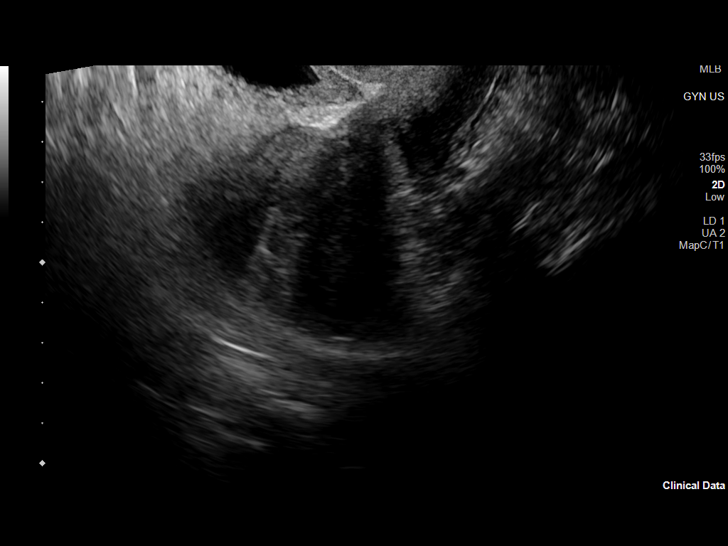
[im 32/48]
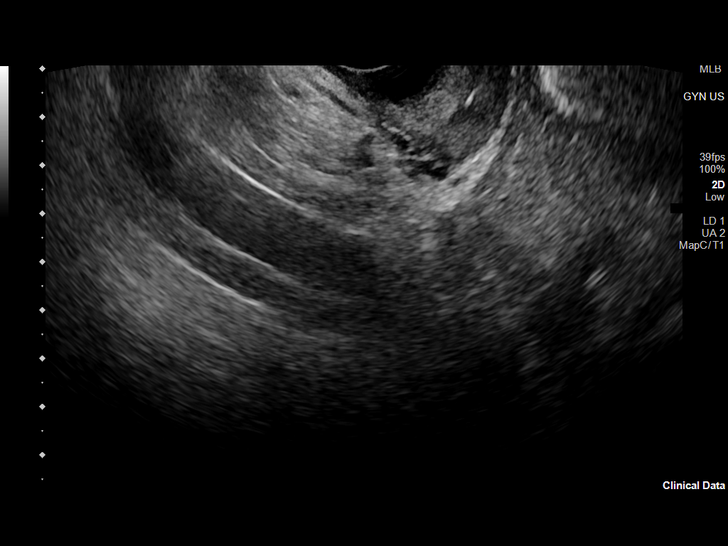
[im 36/48]
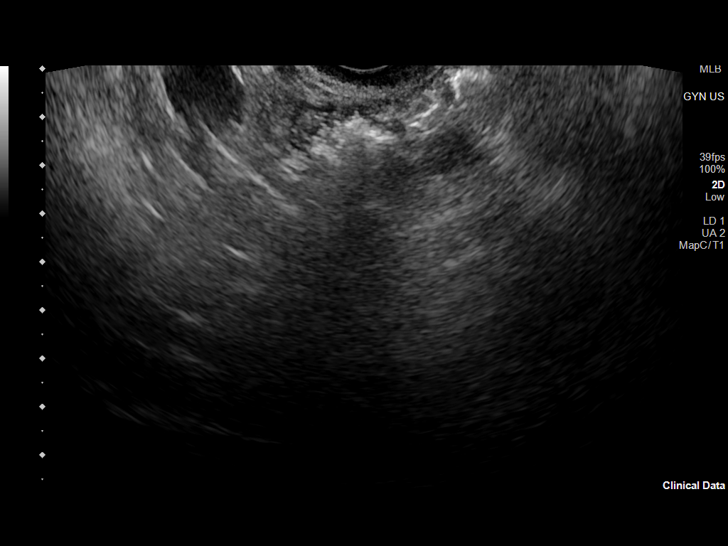
[im 40/48]
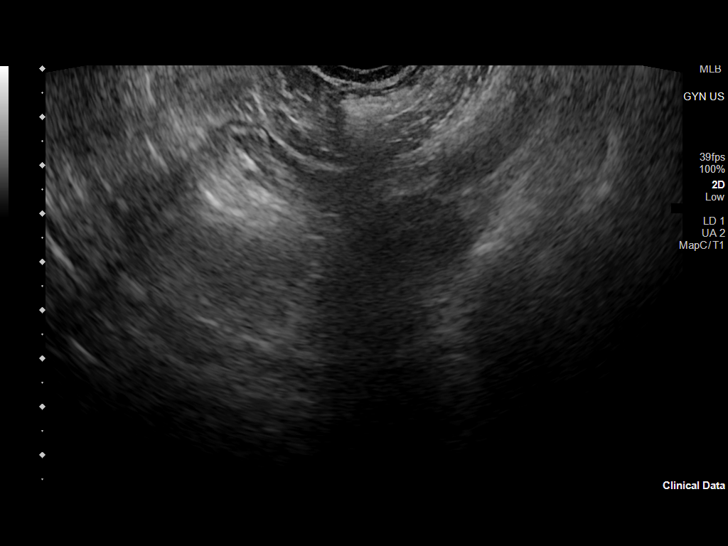
[im 44/48]
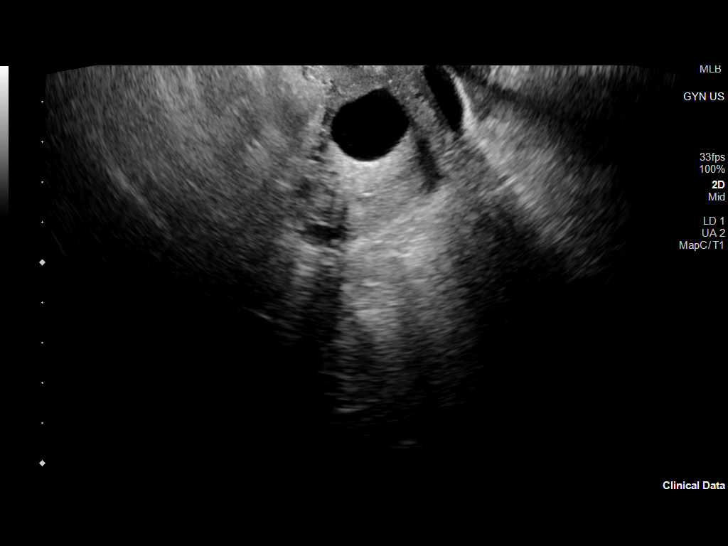
[im 48/48]
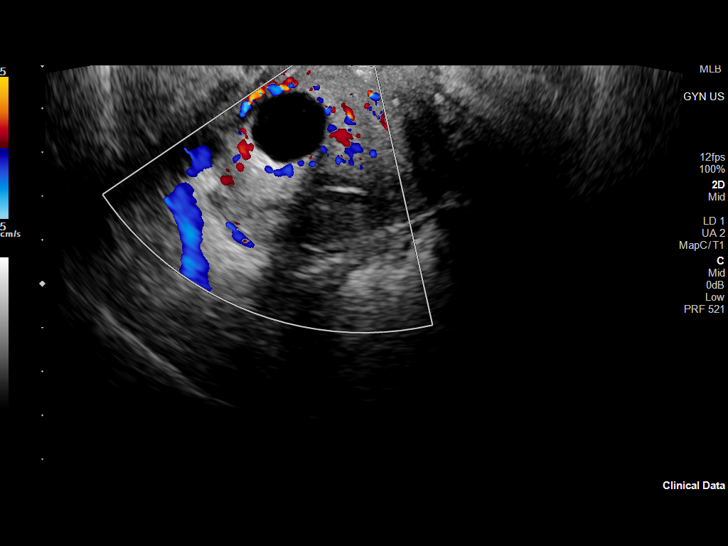

[14 of 25 positions shown; findings below may reference images not displayed]

FINDINGS: Uterus

Measurements: 13.6 x 4.4 x 5.5 cm = volume: 170 mL. Anteverted.
Subserosal leiomyoma anterior uterus 18 mm diameter. Heterogeneous
myometrium without additional mass. Nabothian cyst at cervix.

Endometrium

Thickness: 11 mm.  No endometrial fluid or mass.

Right ovary

Not visualized, likely obscured by bowel

Left ovary

Not visualized, likely obscured by bowel

Other findings

No free pelvic fluid.  No adnexal masses.
IMPRESSION: Small subserosal leiomyoma anterior uterus 18 mm diameter.

Nonvisualization of ovaries.

No acute abnormalities.

## 2023-01-30 ENCOUNTER — Other Ambulatory Visit: Payer: Self-pay

## 2023-01-30 ENCOUNTER — Encounter (HOSPITAL_BASED_OUTPATIENT_CLINIC_OR_DEPARTMENT_OTHER): Payer: Self-pay

## 2023-01-30 ENCOUNTER — Emergency Department (HOSPITAL_BASED_OUTPATIENT_CLINIC_OR_DEPARTMENT_OTHER)
Admission: EM | Admit: 2023-01-30 | Discharge: 2023-01-30 | Disposition: A | Discharge status: Home / Self Care | Payer: 59 | Attending: Emergency Medicine | Admitting: Emergency Medicine

## 2023-01-30 DIAGNOSIS — M545 Low back pain, unspecified: Secondary | ICD-10-CM | POA: Diagnosis present

## 2023-01-30 DIAGNOSIS — Z7982 Long term (current) use of aspirin: Secondary | ICD-10-CM | POA: Diagnosis not present

## 2023-01-30 DIAGNOSIS — Y9389 Activity, other specified: Secondary | ICD-10-CM | POA: Insufficient documentation

## 2023-01-30 DIAGNOSIS — X500XXA Overexertion from strenuous movement or load, initial encounter: Secondary | ICD-10-CM | POA: Diagnosis not present

## 2023-01-30 DIAGNOSIS — S39012A Strain of muscle, fascia and tendon of lower back, initial encounter: Secondary | ICD-10-CM | POA: Diagnosis not present

## 2023-01-30 LAB — URINALYSIS, ROUTINE W REFLEX MICROSCOPIC
Bilirubin Urine: NEGATIVE
Glucose, UA: NEGATIVE mg/dL
Hgb urine dipstick: NEGATIVE
Ketones, ur: NEGATIVE mg/dL
Leukocytes,Ua: NEGATIVE
Nitrite: NEGATIVE
Protein, ur: NEGATIVE mg/dL
Specific Gravity, Urine: <1.005 — ABNORMAL LOW (ref 1.005–1.030)
pH: 5.5 (ref 5.0–8.0)

## 2023-01-30 LAB — PREGNANCY, URINE: Preg Test, Ur: NEGATIVE

## 2023-01-30 MED ORDER — CYCLOBENZAPRINE HCL 10 MG PO TABS: 10.0000 mg | ORAL_TABLET | Freq: Two times a day (BID) | ORAL | 0 refills | Status: DC | PRN

## 2023-01-30 MED ORDER — LIDOCAINE 5 % EX PTCH
1.0000 | MEDICATED_PATCH | CUTANEOUS | Status: DC
  Administered 2023-01-30: 1 via TRANSDERMAL
  Filled 2023-01-30: qty 1

## 2023-01-30 MED ORDER — CYCLOBENZAPRINE HCL 5 MG PO TABS
5.0000 mg | ORAL_TABLET | Freq: Once | ORAL | Status: AC
  Administered 2023-01-30: 5 mg via ORAL
  Filled 2023-01-30: qty 1

## 2023-01-30 MED ORDER — LIDOCAINE 5 % EX PTCH: 1.0000 | MEDICATED_PATCH | CUTANEOUS | 0 refills | Status: DC

## 2023-01-30 MED ORDER — KETOROLAC TROMETHAMINE 60 MG/2ML IM SOLN
30.0000 mg | Freq: Once | INTRAMUSCULAR | Status: AC
  Administered 2023-01-30: 30 mg via INTRAMUSCULAR
  Filled 2023-01-30: qty 2

## 2023-01-30 NOTE — Discharge Instructions (Signed)
Recommend Tylenol, Flexeril, lidocaine patches for pain control.  Follow-up with your PCP.  Return for any severe back pain with associated numbness or weakness in the legs or groin, urinary or fecal incontinence, new falls or trauma.

## 2023-01-30 NOTE — ED Provider Notes (Signed)
[place] EMERGENCY DEPARTMENT AT [facility] Provider Note   CSN: [id number] Arrival date & time: 01/30/23  1840     History  Chief Complaint  Patient presents with   Back Pain    Autumn [name] is a 37 y.o. female.   Back Pain    37 year old female presenting to the emergency department with low back pain.  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  She felt a pull in her low back and has had bilateral lower back pain ever since.  She denies any dysuria increased urinary frequency.  She denies any history of kidney stones.  She denies any saddle anesthesia.  She denies any urinary incontinence or fecal incontinence.  She denies any weakness or numbness in the lower extremities.  She denies any radicular pain.  Pain is worse with twisting and movement.  She has tried Tylenol for the pain without relief.  Home Medications Prior to Admission medications   Medication Sig Start Date End Date Taking? Authorizing Provider  cyclobenzaprine (FLEXERIL) 10 MG tablet Take 1 tablet (10 mg total) by mouth 2 (two) times daily as needed for muscle spasms. 01/30/23  Yes Ernie Avena, MD  lidocaine (LIDODERM) 5 % Place 1 patch onto the skin daily. Remove & Discard patch within 12 hours or as directed by MD 01/30/23  Yes Ernie Avena, MD  acetaminophen (TYLENOL) 500 MG tablet Take 500 mg by mouth every 6 (six) hours as needed for mild pain or headache.    [provider]  amLODipine (NORVASC) 10 MG tablet Take 1 tablet (10 mg total) by mouth daily. 05/20/21   Orion Crook I, NP  aspirin EC 81 MG tablet Take 81 mg by mouth daily. Swallow whole.    [provider]  atorvastatin (LIPITOR) 20 MG tablet Take 1 tablet (20 mg total) by mouth daily. 06/05/22   Gaston Islam., NP  benzonatate (TESSALON) 100 MG capsule Take 1 capsule (100 mg total) by mouth every 8 (eight) hours. 05/08/22   Melene Plan, DO  bisoprolol (ZEBETA) 5 MG tablet  Take 1 tablet (5 mg total) by mouth daily. 09/02/18   Kallie Locks, FNP  blood glucose meter kit and supplies Dispense based on patient and insurance preference. Use up to four times daily as directed. (FOR ICD-9 250.00, 250.01). 05/03/17   Zannie Cove, MD  furosemide (LASIX) 20 MG tablet Take 1 tablet (20 mg total) by mouth daily. 05/20/21   Passmore, Enid Derry I, NP  hydrochlorothiazide (HYDRODIURIL) 25 MG tablet Take 1 tablet (25 mg total) by mouth daily. 05/20/21   Orion Crook I, NP  isosorbide mononitrate (IMDUR) 30 MG 24 hr tablet Take 1 tablet (30 mg total) by mouth daily. 07/05/21 06/02/22  Lorin Glass, MD  losartan (COZAAR) 25 MG tablet Take 1 tablet (25 mg total) by mouth daily. 05/20/21   Orion Crook I, NP  metFORMIN (GLUCOPHAGE-XR) 750 MG 24 hr tablet Take 1 tablet (750 mg total) by mouth 2 (two) times daily. 05/20/21   Passmore, Enid Derry I, NP  metroNIDAZOLE (FLAGYL) 500 MG tablet Take 1 tablet (500 mg total) by mouth 2 (two) times daily. 12/29/21   Hermina Staggers, MD  nitroGLYCERIN (NITROSTAT) 0.4 MG SL tablet Place 1 tablet (0.4 mg total) under the tongue every 5 (five) minutes as needed for up to 10 days for chest pain. 07/05/21 06/02/22  Lorin Glass, MD  ondansetron (ZOFRAN-ODT) 4 MG disintegrating tablet 4mg  ODT q4 hours prn  nausea/vomit 05/08/22   Melene Plan, DO      Allergies    Patient has no known allergies.    Review of Systems   Review of Systems  Musculoskeletal:  Positive for back pain.  All other systems reviewed and are negative.   Physical Exam Updated Vital Signs BP (!) 171/85   Pulse 81   Temp 98.6 F (37 C) (Oral)   Resp 18   Ht 5\' 3"  (1.6 m)   Wt 135.6 kg   SpO2 98%   BMI 52.97 kg/m  Physical Exam Vitals and nursing note reviewed.  Constitutional:      General: She is not in acute distress.    Appearance: She is obese.  HENT:     Head: Normocephalic and atraumatic.  Eyes:     Conjunctiva/sclera: Conjunctivae normal.     Pupils: Pupils  are equal, round, and reactive to light.  Cardiovascular:     Rate and Rhythm: Normal rate and regular rhythm.  Pulmonary:     Effort: Pulmonary effort is normal. No respiratory distress.  Abdominal:     General: There is no distension.     Tenderness: There is no guarding.  Musculoskeletal:        General: No deformity or signs of injury.     Cervical back: Neck supple.     Comments: No midline tenderness of the cervical, thoracic or lumbar spine, negative straight leg raise test bilaterally  Skin:    Findings: No lesion or rash.  Neurological:     General: No focal deficit present.     Mental Status: She is alert. Mental status is at baseline.     Comments: 5 out of 5 strength in all 4 extremities with intact sensation to light touch     ED Results / Procedures / Treatments   Labs (all labs ordered are listed, but only abnormal results are displayed) Labs Reviewed  URINALYSIS, ROUTINE W REFLEX MICROSCOPIC - Abnormal; Notable for the following components:      Result Value   Color, Urine COLORLESS (*)    Specific Gravity, Urine <1.005 (*)    All other components within normal limits  PREGNANCY, URINE    EKG None  Radiology No results found.  Procedures Procedures    Medications Ordered in ED Medications  lidocaine (LIDODERM) 5 % 1 patch (1 patch Transdermal Patch Applied 01/30/23 2049)  cyclobenzaprine (FLEXERIL) tablet 5 mg (5 mg Oral Given 01/30/23 2048)  ketorolac (TORADOL) injection 30 mg (30 mg Intramuscular Given 01/30/23 2050)    ED Course/ Medical Decision Making/ A&P                             Medical Decision Making Amount and/or Complexity of Data Reviewed Labs: ordered.  Risk Prescription drug management.    37 year old female presenting to the emergency department with low back pain.  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  She felt a pull in her low back and has had bilateral lower back pain ever since.  She  denies any dysuria increased urinary frequency.  She denies any history of kidney stones.  She denies any saddle anesthesia.  She denies any urinary incontinence or fecal incontinence.  She denies any weakness or numbness in the lower extremities.  She denies any radicular pain.  Pain is worse with twisting and movement.  She has tried Tylenol for the pain without relief.  On arrival, the patient was vitally stable, afebrile, not tachycardic or tachypneic, mildly hypertensive BP 157/89, saturating 100% on room air.  The patient is able to ambulate and is hemodynamically stable. There are no red flag symptoms.   Differential Diagnoses: I do not think that [name] is experiencing cauda equina syndrome, abdominal aortic aneurysm, epidural abscess, aortic dissection, spinal hematoma, nephrolithiasis, spinal metastasis, discitis, or an acute fracture.  While in the ED, I provided the patient with: Medications  lidocaine (LIDODERM) 5 % 1 patch (1 patch Transdermal Patch Applied 01/30/23 2049)  cyclobenzaprine (FLEXERIL) tablet 5 mg (5 mg Oral Given 01/30/23 2048)  ketorolac (TORADOL) injection 30 mg (30 mg Intramuscular Given 01/30/23 2050)    On reassessment, the patient was symptomatically improved and able to ambulate. This presentation is most consistent with lumbar strain.  Given the patient's reassuring presentation, I believe that she is safe for discharge.  I provided ED return precautions, specifically for the symptoms which are most concerning (e.g., saddle anesthesia, urinary or bowel incontinence or retention, changing or worsening pain), which would necessitate immediate return.  I encouraged the patient to followup with their PCP.  Final Clinical Impression(s) / ED Diagnoses Final diagnoses:  Strain of lumbar region, initial encounter    Rx / DC Orders ED Discharge Orders          Ordered    cyclobenzaprine (FLEXERIL) 10 MG tablet  2 times daily PRN        01/30/23 2217     lidocaine (LIDODERM) 5 %  Every 24 hours        01/30/23 2217              Ernie Avena, MD 01/30/23 2220

## 2023-01-30 NOTE — ED Notes (Signed)
Reviewed AVS/discharge instruction with patient. Time allotted for and all questions answered. Patient is agreeable for d/c and escorted to ed exit by staff.  

## 2023-01-30 NOTE — ED Triage Notes (Signed)
Patient here POV from Home.  Endorses Pain to Lower Back Pain that began Yesterday. 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 No Dysuria. No N/V/D.  NAD Noted during Triage. A&Ox4. GCS 15. Ambulatory.

## 2023-04-17 ENCOUNTER — Emergency Department (HOSPITAL_BASED_OUTPATIENT_CLINIC_OR_DEPARTMENT_OTHER)
Admission: EM | Admit: 2023-04-17 | Discharge: 2023-04-17 | Disposition: A | Discharge status: Home / Self Care | Payer: 59 | Attending: Emergency Medicine | Admitting: Emergency Medicine

## 2023-04-17 ENCOUNTER — Other Ambulatory Visit: Payer: Self-pay

## 2023-04-17 ENCOUNTER — Encounter (HOSPITAL_BASED_OUTPATIENT_CLINIC_OR_DEPARTMENT_OTHER): Payer: Self-pay

## 2023-04-17 DIAGNOSIS — Z7982 Long term (current) use of aspirin: Secondary | ICD-10-CM | POA: Insufficient documentation

## 2023-04-17 DIAGNOSIS — Z20822 Contact with and (suspected) exposure to covid-19: Secondary | ICD-10-CM | POA: Diagnosis not present

## 2023-04-17 DIAGNOSIS — B349 Viral infection, unspecified: Secondary | ICD-10-CM | POA: Diagnosis not present

## 2023-04-17 DIAGNOSIS — R519 Headache, unspecified: Secondary | ICD-10-CM | POA: Diagnosis present

## 2023-04-17 DIAGNOSIS — R509 Fever, unspecified: Secondary | ICD-10-CM | POA: Diagnosis not present

## 2023-04-17 LAB — RESP PANEL BY RT-PCR (RSV, FLU A&B, COVID)  RVPGX2
Influenza A by PCR: NEGATIVE
Influenza B by PCR: NEGATIVE
Resp Syncytial Virus by PCR: NEGATIVE
SARS Coronavirus 2 by RT PCR: NEGATIVE

## 2023-04-17 MED ORDER — ACETAMINOPHEN 325 MG PO TABS
650.0000 mg | ORAL_TABLET | Freq: Once | ORAL | Status: AC
  Administered 2023-04-17: 650 mg via ORAL
  Filled 2023-04-17: qty 2

## 2023-04-17 NOTE — Discharge Instructions (Signed)
Take tylenol 650mg  every 6-8 hours as needed for pain, body aches, headache, or fevers. Drink lots of fluids. Eat natural vitamin C (oranges, strawberries.Marland Kitchen), rest.

## 2023-04-17 NOTE — ED Triage Notes (Signed)
Pt states she has been exposed to covid, c/o body aches and fever.

## 2023-04-17 NOTE — ED Notes (Signed)
Pt verbalized understanding of d/c instructions, meds, and followup care. Denies questions. VSS, no distress noted. Steady gait to exit with all belongings.  ?

## 2023-04-17 NOTE — ED Provider Notes (Signed)
**Autumn [name] De-Identified via Obfuscation** Autumn Autumn [name]   CSN: [id number] Arrival date & time: 04/17/23  1932     History  Chief Complaint  Patient presents with   Covid Exposure    Autumn Autumn [name] is a 38 y.o. female.  Patient is a 37 yo female presenting for viral syndrome. Admits to fever with oral temp of 101.5 F at home today, body aches, and headache. No abdominal pain, nausea, or vomiting. No coughing. Had covid exposure this weekend. 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   The history is provided by the patient. No language interpreter was used.       Home Medications Prior to Admission medications   Medication Sig Start Date End Date Taking? Authorizing Provider  acetaminophen (TYLENOL) 500 MG tablet Take 500 mg by mouth every 6 (six) hours as needed for mild pain or headache.    [provider]  amLODipine (NORVASC) 10 MG tablet Take 1 tablet (10 mg total) by mouth daily. 05/20/21   Orion Crook I, NP  aspirin EC 81 MG tablet Take 81 mg by mouth daily. Swallow whole.    [provider]  atorvastatin (LIPITOR) 20 MG tablet Take 1 tablet (20 mg total) by mouth daily. 06/05/22   Gaston Islam., NP  benzonatate (TESSALON) 100 MG capsule Take 1 capsule (100 mg total) by mouth every 8 (eight) hours. 05/08/22   Melene Plan, DO  bisoprolol (ZEBETA) 5 MG tablet Take 1 tablet (5 mg total) by mouth daily. 09/02/18   Kallie Locks, FNP  blood glucose meter kit and supplies Dispense based on patient and insurance preference. Use up to four times daily as directed. (FOR ICD-9 250.00, 250.01). 05/03/17   Zannie Cove, MD  cyclobenzaprine (FLEXERIL) 10 MG tablet Take 1 tablet (10 mg total) by mouth 2 (two) times daily as needed for muscle spasms. 01/30/23   Ernie Avena, MD  furosemide (LASIX) 20 MG tablet Take 1 tablet (20 mg total) by mouth daily. 05/20/21   Passmore, Enid Derry I, NP  hydrochlorothiazide (HYDRODIURIL) 25 MG tablet Take 1 tablet  (25 mg total) by mouth daily. 05/20/21   Orion Crook I, NP  isosorbide mononitrate (IMDUR) 30 MG 24 hr tablet Take 1 tablet (30 mg total) by mouth daily. 07/05/21 06/02/22  Dahal, Melina Schools, MD  lidocaine (LIDODERM) 5 % Place 1 patch onto the skin daily. Remove & Discard patch within 12 hours or as directed by MD 01/30/23   Ernie Avena, MD  losartan (COZAAR) 25 MG tablet Take 1 tablet (25 mg total) by mouth daily. 05/20/21   Orion Crook I, NP  metFORMIN (GLUCOPHAGE-XR) 750 MG 24 hr tablet Take 1 tablet (750 mg total) by mouth 2 (two) times daily. 05/20/21   Passmore, Enid Derry I, NP  metroNIDAZOLE (FLAGYL) 500 MG tablet Take 1 tablet (500 mg total) by mouth 2 (two) times daily. 12/29/21   Hermina Staggers, MD  nitroGLYCERIN (NITROSTAT) 0.4 MG SL tablet Place 1 tablet (0.4 mg total) under the tongue every 5 (five) minutes as needed for up to 10 days for chest pain. 07/05/21 06/02/22  Lorin Glass, MD  ondansetron (ZOFRAN-ODT) 4 MG disintegrating tablet 4mg  ODT q4 hours prn nausea/vomit 05/08/22   Melene Plan, DO      Allergies    Patient has no known allergies.    Review of Systems   Review of Systems  Constitutional:  Positive for fever. Negative for chills.  HENT:  Negative for ear pain and  sore throat.   Eyes:  Negative for pain and visual disturbance.  Respiratory:  Negative for cough and shortness of breath.   Cardiovascular:  Negative for chest pain and palpitations.  Gastrointestinal:  Negative for abdominal pain and vomiting.  Genitourinary:  Negative for dysuria and hematuria.  Musculoskeletal:  Positive for myalgias. Negative for arthralgias and back pain.  Skin:  Negative for color change and rash.  Neurological:  Positive for headaches. Negative for seizures and syncope.  All other systems reviewed and are negative.   Physical Exam Updated Vital Signs BP (!) 162/91 (BP Location: Right Wrist)   Pulse 99   Temp 99.4 F (37.4 C)   Resp 20   Ht 5\' 3"  (1.6 m)   Wt (!) 140.6 kg    LMP 03/11/2023 (Exact Date)   SpO2 100%   BMI 54.91 kg/m  Physical Exam Vitals and nursing Autumn [name] reviewed.  Constitutional:      General: She is not in acute distress.    Appearance: She is well-developed.  HENT:     Head: Normocephalic and atraumatic.  Eyes:     Conjunctiva/sclera: Conjunctivae normal.  Cardiovascular:     Rate and Rhythm: Normal rate and regular rhythm.     Heart sounds: No murmur heard. Pulmonary:     Effort: Pulmonary effort is normal. No respiratory distress.     Breath sounds: Normal breath sounds.  Abdominal:     Palpations: Abdomen is soft.     Tenderness: There is no abdominal tenderness.  Musculoskeletal:        General: No swelling.     Cervical back: Neck supple.  Skin:    General: Skin is warm and dry.     Capillary Refill: Capillary refill takes less than 2 seconds.  Neurological:     Mental Status: She is alert.  Psychiatric:        Mood and Affect: Mood normal.     ED Results / Procedures / Treatments   Labs (all labs ordered are listed, but only abnormal results are displayed) Labs Reviewed  RESP PANEL BY RT-PCR (RSV, FLU A&B, COVID)  [id number]    EKG None  Radiology No results found.  Procedures Procedures    Medications Ordered in ED Medications  acetaminophen (TYLENOL) tablet 650 mg (650 mg Oral Given 04/17/23 2256)    ED Course/ Medical Decision Making/ A&P                             Medical Decision Making Risk OTC drugs.   37 yo female presenting for viral syndrome. On arrival to ED patient is Aox3, no acute distress, afebrile with stable vitals. Well appearing on physical exam. Covid test negative. Flu negative. Recommended for increased fluids, rest, vitamin C, and motrin/tylenol for myalgias and/or fevers. Work Autumn [name] requested and provided.   Patient in no distress and overall condition improved here in the ED. Detailed discussions were had with the patient regarding current findings, and need for close f/u with  PCP or on call doctor. The patient has been instructed to return immediately if the symptoms worsen in any way for re-evaluation. Patient verbalized understanding and is in agreement with current care plan. All questions answered prior to discharge.         Final Clinical Impression(s) / ED Diagnoses Final diagnoses:  Viral syndrome    Rx / DC Orders ED Discharge Orders     None  Edwin Dada P, DO 04/23/23 1423

## 2023-10-02 ENCOUNTER — Encounter: Payer: Self-pay | Admitting: Nurse Practitioner

## 2023-10-02 ENCOUNTER — Other Ambulatory Visit: Payer: Self-pay

## 2023-10-02 ENCOUNTER — Ambulatory Visit (INDEPENDENT_AMBULATORY_CARE_PROVIDER_SITE_OTHER): Payer: 59 | Admitting: Nurse Practitioner

## 2023-10-02 ENCOUNTER — Telehealth: Payer: Self-pay

## 2023-10-02 VITALS — BP 134/70 | HR 89 | Temp 97.0°F | Ht 66.0 in | Wt 308.0 lb

## 2023-10-02 DIAGNOSIS — I10 Essential (primary) hypertension: Secondary | ICD-10-CM

## 2023-10-02 DIAGNOSIS — I2510 Atherosclerotic heart disease of native coronary artery without angina pectoris: Secondary | ICD-10-CM

## 2023-10-02 DIAGNOSIS — Z6841 Body Mass Index (BMI) 40.0 and over, adult: Secondary | ICD-10-CM

## 2023-10-02 DIAGNOSIS — E785 Hyperlipidemia, unspecified: Secondary | ICD-10-CM

## 2023-10-02 DIAGNOSIS — E66813 Obesity, class 3: Secondary | ICD-10-CM | POA: Diagnosis not present

## 2023-10-02 DIAGNOSIS — R110 Nausea: Secondary | ICD-10-CM

## 2023-10-02 DIAGNOSIS — E1165 Type 2 diabetes mellitus with hyperglycemia: Secondary | ICD-10-CM | POA: Diagnosis not present

## 2023-10-02 DIAGNOSIS — Z1231 Encounter for screening mammogram for malignant neoplasm of breast: Secondary | ICD-10-CM | POA: Diagnosis not present

## 2023-10-02 MED ORDER — ONDANSETRON HCL 4 MG PO TABS: 4.0000 mg | ORAL_TABLET | Freq: Three times a day (TID) | ORAL | 0 refills | Status: AC | PRN

## 2023-10-02 MED ORDER — TIRZEPATIDE 2.5 MG/0.5ML ~~LOC~~ SOAJ
2.5000 mg | SUBCUTANEOUS | 0 refills | Status: DC
Start: 2023-10-02 — End: 2023-10-26

## 2023-10-02 NOTE — Patient Instructions (Signed)
 Goal for fasting blood sugar ranges from 80 to 120 and 2 hours after any meal or at bedtime should be between 130 to 170.     Type 2 diabetes mellitus with hyperglycemia, without long-term current use of insulin  (HCC)  - Ambulatory referral to Ophthalmology - POCT glycosylated hemoglobin (Hb A1C) - Microalbumin / creatinine urine ratio - tirzepatide  (MOUNJARO ) 2.5 MG/0.5ML Pen; Inject 2.5 mg into the skin once a week.  Dispense: 2 mL; Refill: 0   Nausea  - Ambulatory referral to Gastroenterology - ondansetron  (ZOFRAN ) 4 MG tablet; Take 1 tablet (4 mg total) by mouth every 8 (eight) hours as needed for nausea or vomiting.  Dispense: 20 tablet; Refill: 0  It is important that you exercise regularly at least 30 minutes 5 times a week as tolerated  Think about what you will eat, plan ahead. Choose  clean, green, fresh or frozen over canned, processed or packaged foods which are more sugary, salty and fatty. 70 to 75% of food eaten should be vegetables and fruit. Three meals at set times with snacks allowed between meals, but they must be fruit or vegetables. Aim to eat over a 12 hour period , example 7 am to 7 pm, and STOP after  your last meal of the day. Drink water,generally about 64 ounces per day, no other drink is as healthy. Fruit juice is best enjoyed in a healthy way, by EATING the fruit.  Thanks for choosing [facility] we consider it a privelige to serve you.

## 2023-10-02 NOTE — Progress Notes (Signed)
 New Patient Office Visit  Subjective:  Patient ID: Autumn [name], female    DOB: 07/05/1986  Age: 38 y.o. MRN: [id number]  CC:  Chief Complaint  Patient presents with   Establish Care    HPI Autumn [name] is a 38 y.o. female  has a past medical history of Coronary artery disease involving native coronary artery of native heart without angina pectoris (08/08/2021), Diabetes mellitus, type II (HCC), Essential hypertension, H/O varicella, Headache, Heart murmur, Heel spur, right (01/2020), Hypertension, Leaky heart valve, Mini stroke, Obese, Prediabetes, Torn Achilles tendon, right, initial encounter (01/2020), and Vitamin D  deficiency (02/2020).   Patient presented establish care for her chronic medical conditions Previous PCP [place] NP, patient last seen in this office in 2022  CAD.  She is followed by cardiology.  Currently on bisoprolol  5 mg daily, amlodipine  10 mg daily, furosemide  20 mg daily, hydrochlorothiazide  25 mg daily, isosorbide  mononitrate 30 mg daily, losartan  25 mg daily.  She denies chest pain shortness of breath edema  Type 2 diabetes.  Currently on metformin  750 mg twice daily, stated that she was on Trulicity  in the past, stated that the medication was discontinued when her previous PCP increased her dosage of metformin .  She would like to start taking a GLP-1 instead of metformin  since this may also assist with weight loss.  She has been trying to follow a low-carb diet and walking more.  Takes atorvastatin  20 mg daily for hyperlipidemia     Past Medical History:  Diagnosis Date   Coronary artery disease involving native coronary artery of native heart without angina pectoris 08/08/2021   Diabetes mellitus, type II (HCC)    Essential hypertension    H/O varicella    Headache    Heart murmur    Heel spur, right 01/2020   Hypertension    stopped meds since pregnant   Leaky heart valve    mild MR by echo   Mini stroke    2018    Obese     Prediabetes    Torn Achilles tendon, right, initial encounter 01/2020   Vitamin D  deficiency 02/2020    Past Surgical History:  Procedure Laterality Date   BILATERAL SALPINGECTOMY Bilateral 11/28/2013   Procedure: BILATERAL SALPINGECTOMY;  Surgeon: Ovid DELENA All, MD;  Location: [facility];  Service: Obstetrics;  Laterality: Bilateral;  Partial on Left   CESAREAN SECTION  05/14/2012   Procedure: CESAREAN SECTION;  Surgeon: Shanda SHAUNNA Muscat, MD;  Location: [facility];  Service: Gynecology;  Laterality: N/A;  Primary cesarean section of baby girl    at 0533 APGAR 8/9   CESAREAN SECTION N/A 11/28/2013   Procedure: CESAREAN SECTION;  Surgeon: Ovid DELENA All, MD;  Location: [facility];  Service: Obstetrics;  Laterality: N/A;   CESAREAN SECTION      Family History  Problem Relation Age of Onset   Hypertension Mother    Diabetes Mellitus II Mother    Breast cancer Mother 59   Heart failure Mother    CAD Father    Hyperlipidemia Father    Heart disease Father    Heart attack Father    Hypertension Sister    Diabetes Mellitus II Sister    Diabetes Sister    Thyroid  disease Sister    Anemia Sister    Hypertension Sister    Depression Sister    Heart murmur Sister    Heart murmur Daughter    Anesthesia problems Neg Hx     Social History  Socioeconomic History   Marital status: Married    Spouse name: Not on file   Number of children: 2   Years of education: Not on file   Highest education level: Not on file  Occupational History   Not on file  Tobacco Use   Smoking status: Never   Smokeless tobacco: Never  Vaping Use   Vaping status: Never Used  Substance and Sexual Activity   Alcohol use: No   Drug use: No   Sexual activity: Yes    Birth control/protection: None  Other Topics Concern   Not on file  Social History Narrative   ** Merged History Encounter **       Lives with husband and 2 children in a one story home.   Works as an data processing manager.   Education: college  degree.   Social Drivers of Corporate Investment Banker Strain: Not on file  Food Insecurity: Not on file  Transportation Needs: Not on file  Physical Activity: Not on file  Stress: Not on file  Social Connections: Not on file  Intimate Partner Violence: Not on file    ROS Review of Systems  Constitutional:  Negative for appetite change, chills, fatigue and fever.  HENT:  Negative for congestion, postnasal drip, rhinorrhea and sneezing.   Respiratory:  Negative for cough, shortness of breath and wheezing.   Cardiovascular:  Negative for chest pain, palpitations and leg swelling.  Gastrointestinal:  Positive for nausea. Negative for abdominal pain, constipation and vomiting.  Genitourinary:  Negative for difficulty urinating, dysuria, flank pain and frequency.  Musculoskeletal:  Negative for arthralgias, back pain, joint swelling and myalgias.  Skin:  Negative for color change, pallor, rash and wound.  Neurological:  Negative for dizziness, facial asymmetry, weakness, numbness and headaches.  Psychiatric/Behavioral:  Negative for behavioral problems, confusion, self-injury and suicidal ideas.     Objective:   Today's Vitals: BP 134/70   Pulse 89   Temp (!) 97 F (36.1 C)   Ht 5' 6 (1.676 m)   Wt (!) 308 lb (139.7 kg)   SpO2 100%   BMI 49.71 kg/m   Physical Exam Vitals and nursing note reviewed.  Constitutional:      General: She is not in acute distress.    Appearance: Normal appearance. She is obese. She is not ill-appearing, toxic-appearing or diaphoretic.  HENT:     Mouth/Throat:     Mouth: Mucous membranes are moist.     Pharynx: Oropharynx is clear. No oropharyngeal exudate or posterior oropharyngeal erythema.  Eyes:     General: No scleral icterus.       Right eye: No discharge.        Left eye: No discharge.     Extraocular Movements: Extraocular movements intact.     Conjunctiva/sclera: Conjunctivae normal.  Cardiovascular:     Rate and Rhythm: Normal  rate and regular rhythm.     Pulses: Normal pulses.     Heart sounds: Normal heart sounds. No murmur heard.    No friction rub. No gallop.  Pulmonary:     Effort: Pulmonary effort is normal. No respiratory distress.     Breath sounds: Normal breath sounds. No stridor. No wheezing, rhonchi or rales.  Chest:     Chest wall: No tenderness.  Abdominal:     General: There is no distension.     Palpations: Abdomen is soft.     Tenderness: There is no abdominal tenderness. There is no right CVA tenderness, left  CVA tenderness or guarding.  Musculoskeletal:        General: No swelling, tenderness, deformity or signs of injury.     Right lower leg: No edema.     Left lower leg: No edema.  Skin:    General: Skin is warm and dry.     Capillary Refill: Capillary refill takes less than 2 seconds.     Coloration: Skin is not jaundiced or pale.     Findings: No bruising, erythema or lesion.  Neurological:     Mental Status: She is alert and oriented to person, place, and time.     Motor: No weakness.     Coordination: Coordination normal.     Gait: Gait normal.  Psychiatric:        Mood and Affect: Mood normal.        Behavior: Behavior normal.        Thought Content: Thought content normal.        Judgment: Judgment normal.     Assessment & Plan:   Problem List Items Addressed This Visit       Cardiovascular and Mediastinum   Essential hypertension   BP Readings from Last 3 Encounters:  10/02/23 134/70  04/17/23 (!) 162/91  01/30/23 (!) 159/99   HTN Fairly Controlled .  Continue amlodipine  10 mg daily, isosorbide  30 mg daily, losartan  25 mg daily, bisoprolol  5 mg daily, hydrochlorothiazide  25 mg daily, furosemide  20 mg daily Continue current medications. No changes in management. Discussed DASH diet and dietary sodium restrictions Continue to increase dietary efforts and exercise.  CMP today Encouraged to follow-up with cardiology as well        Relevant Orders   CMP14+EGFR    Coronary artery disease involving native coronary artery of native heart without angina pectoris   Continue aspirin  81 mg daily  Continue amlodipine  10 mg daily, isosorbide  30 mg daily, losartan  25 mg daily, bisoprolol  5 mg daily, hydrochlorothiazide  25 mg daily, furosemide  20 mg daily Encouraged to follow-up with cardiology as she is due for follow-up        Endocrine   Type 2 diabetes mellitus with hyperglycemia, without long-term current use of insulin  (HCC) - Primary   Lab Results  Component Value Date   HGBA1C 6.7 (A) 05/20/2021   HGBA1C 6.7 05/20/2021   HGBA1C 6.7 (A) 05/20/2021   HGBA1C 6.7 05/20/2021  Chronic medical condition currently well-controlled on metformin  750 mg twice daily Interested in starting a GLP-1 Mounjaro  2.5 mg once weekly injection ordered, she still wants to try the medication despite her chronic nausea Patient denies personal or family history of MTC or MEN 2.  They denied personal history of pancreatitis.  Patient encouraged to avoid fatty fried foods eat smaller portions of meal to help decrease nausea.  Encouraged to report abdominal pain, nausea, vomiting.  Patient counseled on low-carb diet Encouraged engage in regular moderate to vigorous exercise at least 150 minutes weekly Checking A1c, urine microalbumin, diabetic eye exam ordered Follow-up in 4 weeks      Relevant Medications   tirzepatide  (MOUNJARO ) 2.5 MG/0.5ML Pen   Other Relevant Orders   Ambulatory referral to Ophthalmology   POCT glycosylated hemoglobin (Hb A1C)   Microalbumin / creatinine urine ratio   CBC     Other   Class 3 severe obesity due to excess calories with serious comorbidity and body mass index (BMI) of 50.0 to 59.9 in adult [facility])   Wt Readings from Last 3 Encounters:  10/02/23 (!) 308  lb (139.7 kg)  04/17/23 (!) 310 lb (140.6 kg)  01/30/23 299 lb (135.6 kg)   Body mass index is 49.71 kg/m.  Patient counseled on low-carb diet Encouraged engage in regular moderate  to vigorous exercise at least 150 minutes weekly Starting Mounjaro  for type 2 diabetes which will also assist with weight management      Relevant Medications   tirzepatide  (MOUNJARO ) 2.5 MG/0.5ML Pen   Dyslipidemia, goal LDL below 70   Currently on atorvastatin  20 mg daily We will check lipid panel at next visit      Nausea   Patient referred to GI Zofran  4 mg every 8 hours as needed refilled      Relevant Medications   ondansetron  (ZOFRAN ) 4 MG tablet   Other Relevant Orders   Ambulatory referral to Gastroenterology    Outpatient Encounter Medications as of 10/02/2023  Medication Sig   acetaminophen  (TYLENOL ) 500 MG tablet Take 500 mg by mouth every 6 (six) hours as needed for mild pain or headache.   amLODipine  (NORVASC ) 10 MG tablet Take 1 tablet (10 mg total) by mouth daily.   aspirin  EC 81 MG tablet Take 81 mg by mouth daily. Swallow whole.   atorvastatin  (LIPITOR) 20 MG tablet Take 1 tablet (20 mg total) by mouth daily.   bisoprolol  (ZEBETA ) 5 MG tablet Take 1 tablet (5 mg total) by mouth daily.   blood glucose meter kit and supplies Dispense based on patient and insurance preference. Use up to four times daily as directed. (FOR ICD-9 250.00, 250.01).   furosemide  (LASIX ) 20 MG tablet Take 1 tablet (20 mg total) by mouth daily.   hydrochlorothiazide  (HYDRODIURIL ) 25 MG tablet Take 1 tablet (25 mg total) by mouth daily.   isosorbide  mononitrate (IMDUR ) 30 MG 24 hr tablet Take 1 tablet (30 mg total) by mouth daily.   lidocaine  (LIDODERM ) 5 % Place 1 patch onto the skin daily. Remove & Discard patch within 12 hours or as directed by MD   losartan  (COZAAR ) 25 MG tablet Take 1 tablet (25 mg total) by mouth daily.   metFORMIN  (GLUCOPHAGE -XR) 750 MG 24 hr tablet Take 1 tablet (750 mg total) by mouth 2 (two) times daily.   nitroGLYCERIN  (NITROSTAT ) 0.4 MG SL tablet Place 1 tablet (0.4 mg total) under the tongue every 5 (five) minutes as needed for up to 10 days for chest pain.    ondansetron  (ZOFRAN ) 4 MG tablet Take 1 tablet (4 mg total) by mouth every 8 (eight) hours as needed for nausea or vomiting.   tirzepatide  (MOUNJARO ) 2.5 MG/0.5ML Pen Inject 2.5 mg into the skin once a week.   cyclobenzaprine  (FLEXERIL ) 10 MG tablet Take 1 tablet (10 mg total) by mouth 2 (two) times daily as needed for muscle spasms. (Patient not taking: Reported on 10/02/2023)   [DISCONTINUED] benzonatate  (TESSALON ) 100 MG capsule Take 1 capsule (100 mg total) by mouth every 8 (eight) hours.   [DISCONTINUED] metroNIDAZOLE  (FLAGYL ) 500 MG tablet Take 1 tablet (500 mg total) by mouth 2 (two) times daily. (Patient not taking: Reported on 10/02/2023)   [DISCONTINUED] ondansetron  (ZOFRAN -ODT) 4 MG disintegrating tablet 4mg  ODT q4 hours prn nausea/vomit (Patient not taking: Reported on 10/02/2023)   No facility-administered encounter medications on file as of 10/02/2023.    Follow-up: Return in about 4 weeks (around 10/30/2023) for DM.   Sicilia Killough R Waleska Buttery, FNP

## 2023-10-02 NOTE — Assessment & Plan Note (Signed)
 Currently on atorvastatin 20 mg daily We will check lipid panel at next visit

## 2023-10-02 NOTE — Assessment & Plan Note (Signed)
 Patient referred to GI Zofran 4 mg every 8 hours as needed refilled

## 2023-10-02 NOTE — Telephone Encounter (Signed)
-----   Message from [place] sent at 10/02/2023  3:17 PM EST ----- Gillis Ends.  Kindly initiate a PA for [employer].  Thank you

## 2023-10-02 NOTE — Assessment & Plan Note (Addendum)
 Lab Results  Component Value Date   HGBA1C 6.7 (A) 05/20/2021   HGBA1C 6.7 05/20/2021   HGBA1C 6.7 (A) 05/20/2021   HGBA1C 6.7 05/20/2021  Chronic medical condition currently well-controlled on metformin  750 mg twice daily Interested in starting a GLP-1 Mounjaro  2.5 mg once weekly injection ordered, she still wants to try the medication despite her chronic nausea Patient denies personal or family history of MTC or MEN 2.  They denied personal history of pancreatitis.  Patient encouraged to avoid fatty fried foods eat smaller portions of meal to help decrease nausea.  Encouraged to report abdominal pain, nausea, vomiting.  Patient counseled on low-carb diet Encouraged engage in regular moderate to vigorous exercise at least 150 minutes weekly Checking A1c, urine microalbumin, diabetic eye exam ordered Follow-up in 4 weeks

## 2023-10-02 NOTE — Assessment & Plan Note (Signed)
 Wt Readings from Last 3 Encounters:  10/02/23 (!) 308 lb (139.7 kg)  04/17/23 (!) 310 lb (140.6 kg)  01/30/23 299 lb (135.6 kg)   Body mass index is 49.71 kg/m.  Patient counseled on low-carb diet Encouraged engage in regular moderate to vigorous exercise at least 150 minutes weekly Starting Mounjaro  for type 2 diabetes which will also assist with weight management

## 2023-10-02 NOTE — Assessment & Plan Note (Signed)
 Continue aspirin  81 mg daily  Continue amlodipine  10 mg daily, isosorbide  30 mg daily, losartan  25 mg daily, bisoprolol  5 mg daily, hydrochlorothiazide  25 mg daily, furosemide  20 mg daily Encouraged to follow-up with cardiology as she is due for follow-up

## 2023-10-02 NOTE — Telephone Encounter (Signed)
 Pharmacy Patient Advocate Encounter  Received notification from [facility] that Prior Authorization for [facility] has been APPROVED from 10/02/2023 to 10/01/2026   PA #/Case ID/Reference #: [id number]

## 2023-10-02 NOTE — Assessment & Plan Note (Signed)
 BP Readings from Last 3 Encounters:  10/02/23 134/70  04/17/23 (!) 162/91  01/30/23 (!) 159/99   HTN Fairly Controlled .  Continue amlodipine  10 mg daily, isosorbide  30 mg daily, losartan  25 mg daily, bisoprolol  5 mg daily, hydrochlorothiazide  25 mg daily, furosemide  20 mg daily Continue current medications. No changes in management. Discussed DASH diet and dietary sodium restrictions Continue to increase dietary efforts and exercise.  CMP today Encouraged to follow-up with cardiology as well

## 2023-10-03 ENCOUNTER — Other Ambulatory Visit: Payer: Self-pay

## 2023-10-03 LAB — CMP14+EGFR
ALT: 13 [IU]/L (ref 0–32)
AST: 13 [IU]/L (ref 0–40)
Albumin: 3.9 g/dL (ref 3.9–4.9)
Alkaline Phosphatase: 95 [IU]/L (ref 44–121)
BUN/Creatinine Ratio: 10 (ref 9–23)
BUN: 8 mg/dL (ref 6–20)
Bilirubin Total: 0.5 mg/dL (ref 0.0–1.2)
CO2: 24 mmol/L (ref 20–29)
Calcium: 9.1 mg/dL (ref 8.7–10.2)
Chloride: 103 mmol/L (ref 96–106)
Creatinine, Ser: 0.79 mg/dL (ref 0.57–1.00)
Globulin, Total: 3.8 g/dL (ref 1.5–4.5)
Glucose: 112 mg/dL — ABNORMAL HIGH (ref 70–99)
Potassium: 4.7 mmol/L (ref 3.5–5.2)
Sodium: 138 mmol/L (ref 134–144)
Total Protein: 7.7 g/dL (ref 6.0–8.5)
eGFR: 98 mL/min/{1.73_m2} (ref >59)

## 2023-10-03 LAB — CBC
Hematocrit: 34.6 % (ref 34.0–46.6)
Hemoglobin: 11.1 g/dL (ref 11.1–15.9)
MCH: 24.6 pg — ABNORMAL LOW (ref 26.6–33.0)
MCHC: 32.1 g/dL (ref 31.5–35.7)
MCV: 77 fL — ABNORMAL LOW (ref 79–97)
Platelets: 341 10*3/uL (ref 150–450)
RBC: 4.52 x10E6/uL (ref 3.77–5.28)
RDW: 13.6 % (ref 11.7–15.4)
WBC: 8.6 10*3/uL (ref 3.4–10.8)

## 2023-10-03 LAB — POCT GLYCOSYLATED HEMOGLOBIN (HGB A1C): Hemoglobin A1C: 6.4 % — AB (ref 4.0–5.6)

## 2023-10-03 LAB — MICROALBUMIN / CREATININE URINE RATIO
Creatinine, Urine: 132.9 mg/dL
Microalb/Creat Ratio: 7 mg/g{creat} (ref 0–29)
Microalbumin, Urine: 9.4 ug/mL

## 2023-10-03 NOTE — Telephone Encounter (Signed)
-----   Message from Folashade R Paseda sent at 10/02/2023  5:07 PM EST ----- Thank you so much. Kim please tell the patient to stop taking metformin  once she starts taking monjuaro. ----- Message ----- From: Chrys Burnard MATSU, CPhT Sent: 10/02/2023   3:49 PM EST To: Folashade R Paseda, FNP  This has been approved and the pharmacy is working on filling it for her now ----- Message ----- From: Paseda, Folashade R, FNP Sent: 10/02/2023   3:18 PM EST To: Burnard MATSU Chrys, CPhT  Hlello Burnard.  Kindly initiate a PA for Mounjaro .  Thank you

## 2023-10-24 ENCOUNTER — Other Ambulatory Visit: Payer: Self-pay | Admitting: Nurse Practitioner

## 2023-10-24 DIAGNOSIS — E1165 Type 2 diabetes mellitus with hyperglycemia: Secondary | ICD-10-CM

## 2023-10-25 NOTE — Telephone Encounter (Signed)
Please advise Kh

## 2023-10-30 ENCOUNTER — Ambulatory Visit (INDEPENDENT_AMBULATORY_CARE_PROVIDER_SITE_OTHER): Payer: 59 | Admitting: Nurse Practitioner

## 2023-10-30 ENCOUNTER — Encounter: Payer: Self-pay | Admitting: Nurse Practitioner

## 2023-10-30 VITALS — BP 138/76 | HR 81 | Temp 97.3°F | Wt 298.0 lb

## 2023-10-30 DIAGNOSIS — R5383 Other fatigue: Secondary | ICD-10-CM | POA: Insufficient documentation

## 2023-10-30 DIAGNOSIS — E1165 Type 2 diabetes mellitus with hyperglycemia: Secondary | ICD-10-CM

## 2023-10-30 DIAGNOSIS — Z6841 Body Mass Index (BMI) 40.0 and over, adult: Secondary | ICD-10-CM | POA: Diagnosis not present

## 2023-10-30 DIAGNOSIS — I10 Essential (primary) hypertension: Secondary | ICD-10-CM | POA: Diagnosis not present

## 2023-10-30 DIAGNOSIS — E66813 Obesity, class 3: Secondary | ICD-10-CM | POA: Diagnosis not present

## 2023-10-30 DIAGNOSIS — E785 Hyperlipidemia, unspecified: Secondary | ICD-10-CM | POA: Diagnosis not present

## 2023-10-30 DIAGNOSIS — Z23 Encounter for immunization: Secondary | ICD-10-CM | POA: Diagnosis not present

## 2023-10-30 MED ORDER — LIDOCAINE 5 % EX PTCH: 1.0000 | MEDICATED_PATCH | CUTANEOUS | 0 refills | Status: DC

## 2023-10-30 MED ORDER — LOSARTAN POTASSIUM 50 MG PO TABS
50.0000 mg | ORAL_TABLET | Freq: Every day | ORAL | 1 refills | Status: DC
Start: 2023-10-30 — End: 2024-06-13

## 2023-10-30 MED ORDER — MOUNJARO 2.5 MG/0.5ML ~~LOC~~ SOAJ: 2.5000 mg | SUBCUTANEOUS | 2 refills | Status: DC

## 2023-10-30 NOTE — Assessment & Plan Note (Addendum)
 Increase losartan  to 50 mg daily, Continue amlodipine  10 mg daily, isosorbide  30 mg daily, bisoprolol  5 mg daily, hydrochlorothiazide  25 mg daily, furosemide  20 mg daily .  Blood pressure goal is less than 130/80.  Patient encouraged to monitor blood pressure at home   DASH diet and commitment to daily physical activity for a minimum of 30 minutes discussed and encouraged, as a part of hypertension management. The importance of attaining a healthy weight is also discussed. Follow-up in 2 months    10/30/2023   10:00 AM 10/30/2023    9:46 AM 10/02/2023    2:32 PM 10/02/2023    2:17 PM 04/17/2023    7:51 PM 04/17/2023    7:50 PM 01/30/2023   10:20 PM  BP/Weight  Systolic BP 138 133 134 153 162  159  Diastolic BP 76 77 70 76 91  99  Wt. (Lbs)  298  308  310   BMI  48.1 kg/m2  49.71 kg/m2  54.91 kg/m2

## 2023-10-30 NOTE — Assessment & Plan Note (Signed)
 Wt Readings from Last 3 Encounters:  10/30/23 298 lb (135.2 kg)  10/02/23 (!) 308 lb (139.7 kg)  04/17/23 (!) 310 lb (140.6 kg)   Body mass index is 48.1 kg/m.  Patient has lost 10 lbs since her last visit, patient congratulated on her efforts at losing Patient counseled on low-carb diet Encouraged to engage in regular moderate to vigorous exercises at least 150 minutes weekly Continue Mounjaro  for type 2 diabetes

## 2023-10-30 NOTE — Assessment & Plan Note (Signed)
Checking lipid panel Currently on atorvastatin 20 mg daily Continue current medication avoid fatty fried foods, lose weight

## 2023-10-30 NOTE — Assessment & Plan Note (Signed)
 Patient educated on CDC recommendation for the vaccine. Verbal consent was obtained from the patient, vaccine administered by nurse, no sign of adverse reactions noted at this time. Patient education on arm soreness and use of tylenol  for this patient  was discussed. Patient educated on the signs and symptoms of adverse effect and advise to contact the office if they occur.  ?

## 2023-10-30 NOTE — Assessment & Plan Note (Signed)
  -   Iron, TIBC and Ferritin Panel - Vitamin B12 - Vitamin D, 25-hydroxy

## 2023-10-30 NOTE — Assessment & Plan Note (Signed)
 Doing well on Mounjaro  2.5 mg once weekly injection Continue current medication Patient counseled on low-carb diet, encouraged to engage in regular moderate to vigorous exercise at least 150 minutes weekly Referral for diabetic eye exam is pending Will get diabetic foot exam at next visit

## 2023-10-30 NOTE — Progress Notes (Signed)
 Established Patient Office Visit  Subjective:  Patient ID: Autumn [name], female    DOB: 1986/06/29  Age: 38 y.o. MRN: [id number]  CC:  Chief Complaint  Patient presents with   Medical Management of Chronic Issues    HPI Autumn [name] is a 38 y.o. female  has a past medical history of Coronary artery disease involving native coronary artery of native heart without angina pectoris (08/08/2021), Diabetes mellitus, type II (HCC), Essential hypertension, H/O varicella, Headache, Heart murmur, Heel spur, right (01/2020), Hypertension, Leaky heart valve, Mini stroke, Obese, Prediabetes, Torn Achilles tendon, right, initial encounter (01/2020), and Vitamin D  deficiency (02/2020).  Patient presents for follow-up for type 2 diabetes  Type 2 diabetes.  Currently on Mounjaro  2.5 mg once weekly injection, stated that she has been doing well on this medication and has had no increased nausea.  Takes atorvastatin  20 mg daily for hyperlipidemia.    Past Medical History:  Diagnosis Date   Coronary artery disease involving native coronary artery of native heart without angina pectoris 08/08/2021   Diabetes mellitus, type II (HCC)    Essential hypertension    H/O varicella    Headache    Heart murmur    Heel spur, right 01/2020   Hypertension    stopped meds since pregnant   Leaky heart valve    mild MR by echo   Mini stroke    2018    Obese    Prediabetes    Torn Achilles tendon, right, initial encounter 01/2020   Vitamin D  deficiency 02/2020    Past Surgical History:  Procedure Laterality Date   BILATERAL SALPINGECTOMY Bilateral 11/28/2013   Procedure: BILATERAL SALPINGECTOMY;  Surgeon: Ovid DELENA All, MD;  Location: [facility];  Service: Obstetrics;  Laterality: Bilateral;  Partial on Left   CESAREAN SECTION  05/14/2012   Procedure: CESAREAN SECTION;  Surgeon: Shanda SHAUNNA Muscat, MD;  Location: [facility];  Service: Gynecology;  Laterality: N/A;  Primary cesarean section of baby  girl    at 0533 APGAR 8/9   CESAREAN SECTION N/A 11/28/2013   Procedure: CESAREAN SECTION;  Surgeon: Ovid DELENA All, MD;  Location: [facility];  Service: Obstetrics;  Laterality: N/A;   CESAREAN SECTION      Family History  Problem Relation Age of Onset   Hypertension Mother    Diabetes Mellitus II Mother    Breast cancer Mother 70   Heart failure Mother    CAD Father    Hyperlipidemia Father    Heart disease Father    Heart attack Father    Hypertension Sister    Diabetes Mellitus II Sister    Diabetes Sister    Thyroid  disease Sister    Anemia Sister    Hypertension Sister    Depression Sister    Heart murmur Sister    Heart murmur Daughter    Anesthesia problems Neg Hx     Social History   Socioeconomic History   Marital status: Married    Spouse name: Not on file   Number of children: 2   Years of education: Not on file   Highest education level: Not on file  Occupational History   Not on file  Tobacco Use   Smoking status: Never   Smokeless tobacco: Never  Vaping Use   Vaping status: Never Used  Substance and Sexual Activity   Alcohol use: No   Drug use: No   Sexual activity: Yes    Birth control/protection: None  Other  Topics Concern   Not on file  Social History Narrative   ** Merged History Encounter **       Lives with husband and 2 children in a one story home.   Works as an data processing manager.   Education: college degree.   Social Drivers of Corporate Investment Banker Strain: Not on file  Food Insecurity: Not on file  Transportation Needs: Not on file  Physical Activity: Not on file  Stress: Not on file  Social Connections: Not on file  Intimate Partner Violence: Not on file    Outpatient Medications Prior to Visit  Medication Sig Dispense Refill   acetaminophen  (TYLENOL ) 500 MG tablet Take 500 mg by mouth every 6 (six) hours as needed for mild pain or headache.     amLODipine  (NORVASC ) 10 MG tablet Take 1 tablet (10 mg total) by mouth  daily. 30 tablet 11   aspirin  EC 81 MG tablet Take 81 mg by mouth daily. Swallow whole.     atorvastatin  (LIPITOR) 20 MG tablet Take 1 tablet (20 mg total) by mouth daily. 90 tablet 3   bisoprolol  (ZEBETA ) 5 MG tablet Take 1 tablet (5 mg total) by mouth daily. 30 tablet 2   blood glucose meter kit and supplies Dispense based on patient and insurance preference. Use up to four times daily as directed. (FOR ICD-9 250.00, 250.01). 1 each 0   furosemide  (LASIX ) 20 MG tablet Take 1 tablet (20 mg total) by mouth daily. 30 tablet 11   hydrochlorothiazide  (HYDRODIURIL ) 25 MG tablet Take 1 tablet (25 mg total) by mouth daily. 30 tablet 11   isosorbide  mononitrate (IMDUR ) 30 MG 24 hr tablet Take 1 tablet (30 mg total) by mouth daily. 30 tablet 2   nitroGLYCERIN  (NITROSTAT ) 0.4 MG SL tablet Place 1 tablet (0.4 mg total) under the tongue every 5 (five) minutes as needed for up to 10 days for chest pain. 20 tablet 0   losartan  (COZAAR ) 25 MG tablet Take 1 tablet (25 mg total) by mouth daily. 30 tablet 11   tirzepatide  (MOUNJARO ) 2.5 MG/0.5ML Pen Inject 2.5 mg into the skin once a week. 3 mL 0   cyclobenzaprine  (FLEXERIL ) 10 MG tablet Take 1 tablet (10 mg total) by mouth 2 (two) times daily as needed for muscle spasms. (Patient not taking: Reported on 10/30/2023) 20 tablet 0   metFORMIN  (GLUCOPHAGE -XR) 750 MG 24 hr tablet Take 1 tablet (750 mg total) by mouth 2 (two) times daily. (Patient not taking: Reported on 10/30/2023) 60 tablet 3   ondansetron  (ZOFRAN ) 4 MG tablet Take 1 tablet (4 mg total) by mouth every 8 (eight) hours as needed for nausea or vomiting. (Patient not taking: Reported on 10/30/2023) 20 tablet 0   lidocaine  (LIDODERM ) 5 % Place 1 patch onto the skin daily. Remove & Discard patch within 12 hours or as directed by MD (Patient not taking: Reported on 10/30/2023) 30 patch 0   No facility-administered medications prior to visit.    No Known Allergies  ROS Review of Systems  Constitutional:  Negative  for appetite change, chills, fatigue and fever.  HENT:  Negative for congestion, postnasal drip, rhinorrhea and sneezing.   Respiratory:  Negative for cough, shortness of breath and wheezing.   Cardiovascular:  Negative for chest pain, palpitations and leg swelling.  Gastrointestinal:  Negative for abdominal pain, constipation, nausea and vomiting.  Genitourinary:  Negative for difficulty urinating, dysuria, flank pain and frequency.  Musculoskeletal:  Negative for arthralgias, back pain,  joint swelling and myalgias.  Skin:  Negative for color change, pallor, rash and wound.  Neurological:  Negative for dizziness, facial asymmetry, weakness, numbness and headaches.  Psychiatric/Behavioral:  Negative for behavioral problems, confusion, self-injury and suicidal ideas.       Objective:    Physical Exam Vitals and nursing note reviewed.  Constitutional:      General: She is not in acute distress.    Appearance: Normal appearance. She is obese. She is not ill-appearing, toxic-appearing or diaphoretic.  HENT:     Mouth/Throat:     Mouth: Mucous membranes are moist.     Pharynx: Oropharynx is clear. No oropharyngeal exudate or posterior oropharyngeal erythema.  Eyes:     General: No scleral icterus.       Right eye: No discharge.        Left eye: No discharge.     Extraocular Movements: Extraocular movements intact.     Conjunctiva/sclera: Conjunctivae normal.  Cardiovascular:     Rate and Rhythm: Normal rate and regular rhythm.     Pulses: Normal pulses.     Heart sounds: Normal heart sounds. No murmur heard.    No friction rub. No gallop.  Pulmonary:     Effort: Pulmonary effort is normal. No respiratory distress.     Breath sounds: Normal breath sounds. No stridor. No wheezing, rhonchi or rales.  Chest:     Chest wall: No tenderness.  Abdominal:     General: There is no distension.     Palpations: Abdomen is soft.     Tenderness: There is no abdominal tenderness. There is no  right CVA tenderness, left CVA tenderness or guarding.  Musculoskeletal:        General: No swelling, tenderness, deformity or signs of injury.     Right lower leg: No edema.     Left lower leg: No edema.  Skin:    General: Skin is warm and dry.     Capillary Refill: Capillary refill takes less than 2 seconds.     Coloration: Skin is not jaundiced or pale.     Findings: No bruising, erythema or lesion.  Neurological:     Mental Status: She is alert and oriented to person, place, and time.     Motor: No weakness.     Coordination: Coordination normal.     Gait: Gait normal.  Psychiatric:        Mood and Affect: Mood normal.        Behavior: Behavior normal.        Thought Content: Thought content normal.        Judgment: Judgment normal.     BP 138/76   Pulse 81   Temp (!) 97.3 F (36.3 C)   Wt 298 lb (135.2 kg)   SpO2 100%   BMI 48.10 kg/m  Wt Readings from Last 3 Encounters:  10/30/23 298 lb (135.2 kg)  10/02/23 (!) 308 lb (139.7 kg)  04/17/23 (!) 310 lb (140.6 kg)    Lab Results  Component Value Date   TSH 1.780 05/20/2021   Lab Results  Component Value Date   WBC 8.6 10/02/2023   HGB 11.1 10/02/2023   HCT 34.6 10/02/2023   MCV 77 (L) 10/02/2023   PLT 341 10/02/2023   Lab Results  Component Value Date   NA 138 10/02/2023   K 4.7 10/02/2023   CO2 24 10/02/2023   GLUCOSE 112 (H) 10/02/2023   BUN 8 10/02/2023   CREATININE 0.79 10/02/2023  BILITOT 0.5 10/02/2023   ALKPHOS 95 10/02/2023   AST 13 10/02/2023   ALT 13 10/02/2023   PROT 7.7 10/02/2023   ALBUMIN 3.9 10/02/2023   CALCIUM  9.1 10/02/2023   ANIONGAP 7 07/04/2021   EGFR 98 10/02/2023   Lab Results  Component Value Date   CHOL 157 06/02/2022   Lab Results  Component Value Date   HDL 53 06/02/2022   Lab Results  Component Value Date   LDLCALC 88 06/02/2022   Lab Results  Component Value Date   TRIG 84 06/02/2022   Lab Results  Component Value Date   CHOLHDL 3.0 06/02/2022   Lab  Results  Component Value Date   HGBA1C 6.4 (A) 10/02/2023      Assessment & Plan:   Problem List Items Addressed This Visit       Cardiovascular and Mediastinum   Essential hypertension - Primary   Increase losartan  to 50 mg daily, Continue amlodipine  10 mg daily, isosorbide  30 mg daily, bisoprolol  5 mg daily, hydrochlorothiazide  25 mg daily, furosemide  20 mg daily .  Blood pressure goal is less than 130/80.  Patient encouraged to monitor blood pressure at home   DASH diet and commitment to daily physical activity for a minimum of 30 minutes discussed and encouraged, as a part of hypertension management. The importance of attaining a healthy weight is also discussed. Follow-up in 2 months    10/30/2023   10:00 AM 10/30/2023    9:46 AM 10/02/2023    2:32 PM 10/02/2023    2:17 PM 04/17/2023    7:51 PM 04/17/2023    7:50 PM 01/30/2023   10:20 PM  BP/Weight  Systolic BP 138 133 134 153 162  159  Diastolic BP 76 77 70 76 91  99  Wt. (Lbs)  298  308  310   BMI  48.1 kg/m2  49.71 kg/m2  54.91 kg/m2            Relevant Medications   losartan  (COZAAR ) 50 MG tablet     Endocrine   Type 2 diabetes mellitus with hyperglycemia, without long-term current use of insulin  (HCC)   Doing well on Mounjaro  2.5 mg once weekly injection Continue current medication Patient counseled on low-carb diet, encouraged to engage in regular moderate to vigorous exercise at least 150 minutes weekly Referral for diabetic eye exam is pending Will get diabetic foot exam at next visit      Relevant Medications   losartan  (COZAAR ) 50 MG tablet   tirzepatide  (MOUNJARO ) 2.5 MG/0.5ML Pen     Other   Class 3 severe obesity due to excess calories with serious comorbidity and body mass index (BMI) of 50.0 to 59.9 in adult [facility])   Wt Readings from Last 3 Encounters:  10/30/23 298 lb (135.2 kg)  10/02/23 (!) 308 lb (139.7 kg)  04/17/23 (!) 310 lb (140.6 kg)   Body mass index is 48.1 kg/m.  Patient has lost 10 lbs  since her last visit, patient congratulated on her efforts at losing Patient counseled on low-carb diet Encouraged to engage in regular moderate to vigorous exercises at least 150 minutes weekly Continue Mounjaro  for type 2 diabetes      Relevant Medications   tirzepatide  (MOUNJARO ) 2.5 MG/0.5ML Pen   Dyslipidemia, goal LDL below 70   Checking lipid panel Currently on atorvastatin  20 mg daily Continue current medication avoid fatty fried foods, lose weight      Relevant Medications   losartan  (COZAAR ) 50 MG tablet  Other Relevant Orders   Lipid panel   Fatigue      - Iron, TIBC and Ferritin Panel - Vitamin B12 - Vitamin D , 25-hydroxy       Relevant Orders   Iron, TIBC and Ferritin Panel   Vitamin B12   Vitamin D , 25-hydroxy   Need for pneumococcal vaccination   Patient educated on CDC recommendation for the vaccine. Verbal consent was obtained from the patient, vaccine administered by nurse, no sign of adverse reactions noted at this time. Patient education on arm soreness and use of tylenol  for this patient  was discussed. Patient educated on the signs and symptoms of adverse effect and advise to contact the office if they occur.       Relevant Orders   Pneumococcal conjugate vaccine 20-valent (Prevnar 20) (Completed)   Need for Tdap vaccination   Patient educated on CDC recommendation for the vaccine. Verbal consent was obtained from the patient, vaccine administered by nurse, no sign of adverse reactions noted at this time. Patient education on arm soreness and use of tylenol  for this patient  was discussed. Patient educated on the signs and symptoms of adverse effect and advise to contact the office if they occur.       Relevant Orders   Td vaccine greater than or equal to 7yo preservative free IM (Completed)    Meds ordered this encounter  Medications   lidocaine  (LIDODERM ) 5 %    Sig: Place 1 patch onto the skin daily. Remove & Discard patch within 12 hours or  as directed by MD    Dispense:  30 patch    Refill:  0   losartan  (COZAAR ) 50 MG tablet    Sig: Take 1 tablet (50 mg total) by mouth daily.    Dispense:  90 tablet    Refill:  1   tirzepatide  (MOUNJARO ) 2.5 MG/0.5ML Pen    Sig: Inject 2.5 mg into the skin once a week.    Dispense:  3 mL    Refill:  2    Follow-up: Return in about 2 months (around 12/28/2023) for DM, HTN.    Lucciano Vitali R Emmitte Surgeon, FNP

## 2023-10-30 NOTE — Patient Instructions (Signed)
Around 3 times per week, check your blood pressure 2 times per day. once in the morning and once in the evening. The readings should be at least one minute apart. Write down these values and bring them to your next nurse visit/appointment.  When you check your BP, make sure you have been doing something calm/relaxing 5 minutes prior to checking. Both feet should be flat on the floor and you should be sitting. Use your left arm and make sure it is in a relaxed position (on a table), and that the cuff is at the approximate level/height of your heart.  Blood pressure goal is less than 130/80    1. Essential hypertension (Primary)  - losartan (COZAAR) 50 MG tablet; Take 1 tablet (50 mg total) by mouth daily.  Dispense: 90 tablet; Refill: 1  2. Fatigue, unspecified type  - Iron, TIBC and Ferritin Panel - Vitamin B12 - Vitamin D, 25-hydroxy  3. Dyslipidemia, goal LDL below 70  - Lipid panel  4. Type 2 diabetes mellitus with hyperglycemia, without long-term current use of insulin (HCC)  - tirzepatide (MOUNJARO) 2.5 MG/0.5ML Pen; Inject 2.5 mg into the skin once a week.  Dispense: 3 mL; Refill: 2    It is important that you exercise regularly at least 30 minutes 5 times a week as tolerated  Think about what you will eat, plan ahead. Choose " clean, green, fresh or frozen" over canned, processed or packaged foods which are more sugary, salty and fatty. 70 to 75% of food eaten should be vegetables and fruit. Three meals at set times with snacks allowed between meals, but they must be fruit or vegetables. Aim to eat over a 12 hour period , example 7 am to 7 pm, and STOP after  your last meal of the day. Drink water,generally about 64 ounces per day, no other drink is as healthy. Fruit juice is best enjoyed in a healthy way, by EATING the fruit.  Thanks for choosing [facility] we consider it a privelige to serve you.

## 2023-10-31 ENCOUNTER — Other Ambulatory Visit: Payer: Self-pay | Admitting: Nurse Practitioner

## 2023-10-31 DIAGNOSIS — E559 Vitamin D deficiency, unspecified: Secondary | ICD-10-CM

## 2023-10-31 MED ORDER — VITAMIN D (ERGOCALCIFEROL) 1.25 MG (50000 UNIT) PO CAPS
50000.0000 [IU] | ORAL_CAPSULE | ORAL | 0 refills | Status: DC
Start: 2023-10-31 — End: 2023-11-23

## 2023-11-01 LAB — IRON,TIBC AND FERRITIN PANEL
Ferritin: 19 ng/mL (ref 15–150)
Iron Saturation: 92 % (ref 15–55)
Iron: 315 ug/dL (ref 27–159)
Total Iron Binding Capacity: 343 ug/dL (ref 250–450)
UIBC: 28 ug/dL — ABNORMAL LOW (ref 131–425)

## 2023-11-01 LAB — VITAMIN D 25 HYDROXY (VIT D DEFICIENCY, FRACTURES): Vit D, 25-Hydroxy: 8.7 ng/mL — ABNORMAL LOW (ref 30.0–100.0)

## 2023-11-01 LAB — LIPID PANEL
Chol/HDL Ratio: 3.2 {ratio} (ref 0.0–4.4)
Cholesterol, Total: 158 mg/dL (ref 100–199)
HDL: 50 mg/dL (ref >39)
LDL Chol Calc (NIH): 93 mg/dL (ref 0–99)
Triglycerides: 77 mg/dL (ref 0–149)
VLDL Cholesterol Cal: 15 mg/dL (ref 5–40)

## 2023-11-01 LAB — VITAMIN B12: Vitamin B-12: 403 pg/mL (ref 232–1245)

## 2023-11-18 ENCOUNTER — Other Ambulatory Visit: Payer: Self-pay | Admitting: Nurse Practitioner

## 2023-11-18 DIAGNOSIS — E1165 Type 2 diabetes mellitus with hyperglycemia: Secondary | ICD-10-CM

## 2023-11-19 ENCOUNTER — Encounter: Payer: Self-pay | Admitting: Gastroenterology

## 2023-11-19 NOTE — Telephone Encounter (Signed)
 Please advise [facility]

## 2023-11-23 ENCOUNTER — Other Ambulatory Visit: Payer: Self-pay | Admitting: Nurse Practitioner

## 2023-11-23 DIAGNOSIS — E559 Vitamin D deficiency, unspecified: Secondary | ICD-10-CM

## 2023-11-23 NOTE — Telephone Encounter (Signed)
 Please advise [facility]

## 2023-11-28 ENCOUNTER — Telehealth: Payer: Self-pay

## 2023-11-28 ENCOUNTER — Other Ambulatory Visit: Payer: Self-pay | Admitting: Nurse Practitioner

## 2023-11-28 DIAGNOSIS — E1165 Type 2 diabetes mellitus with hyperglycemia: Secondary | ICD-10-CM

## 2023-11-28 MED ORDER — MOUNJARO 2.5 MG/0.5ML ~~LOC~~ SOAJ: 2.5000 mg | SUBCUTANEOUS | 0 refills | Status: DC

## 2023-11-28 NOTE — Telephone Encounter (Signed)
 Last Fill:  11/19/23 6 mL/0 RF  Last OV: 10/30/23 Next OV: 12/28/23  Routing to provider for review/authorization.

## 2023-11-28 NOTE — Telephone Encounter (Signed)
 Copied from CRM [id number]. Topic: General - Other >> Nov 28, 2023  2:17 PM Emylou G wrote: Reason for CRM: patient would like to get labs done.. Please contact her back at [phone].. she can come in tomorrow.Marland Kitchen

## 2023-11-28 NOTE — Telephone Encounter (Signed)
 Copied from CRM [id number]. Topic: Clinical - Medication Refill >> Nov 28, 2023  2:13 PM Emylou G wrote: Most Recent Primary Care Visit:  Provider: Donell Beers  Department: [facility]  Visit Type: OFFICE VISIT  Date: 10/30/2023  Medication: tirzepatide [facility]) 2.5 MG/0.5ML Pen  Has the patient contacted their pharmacy? Yes (Agent: If no, request that the patient contact the pharmacy for the refill. If patient does not wish to contact the pharmacy document the reason why and proceed with request.) (Agent: If yes, when and what did the pharmacy advise?) they said its pending  Is this the correct pharmacy for this prescription? Yes If no, delete pharmacy and type the correct one.  This is the patient's preferred pharmacy:  [id number] - [place], [place] - [address] [facility] [address] [place] [address] Phone: [phone] Fax: [phone]  [facility] [address], [address] [place] [address] Phone: [phone] Fax: [phone]   Has the prescription been filled recently? No  Is the patient out of the medication? Yes  Has the patient been seen for an appointment in the last year OR does the patient have an upcoming appointment? Yes  Can we respond through MyChart? Yes  Agent: Please be advised that Rx refills may take up to 3 business days. We ask that you follow-up with your pharmacy.

## 2023-12-28 ENCOUNTER — Ambulatory Visit: Payer: Self-pay | Admitting: Nurse Practitioner

## 2023-12-28 VITALS — BP 142/79 | HR 81 | Temp 98.0°F | Wt 290.0 lb

## 2023-12-28 DIAGNOSIS — R600 Localized edema: Secondary | ICD-10-CM | POA: Diagnosis not present

## 2023-12-28 DIAGNOSIS — E66813 Obesity, class 3: Secondary | ICD-10-CM

## 2023-12-28 DIAGNOSIS — E1165 Type 2 diabetes mellitus with hyperglycemia: Secondary | ICD-10-CM

## 2023-12-28 DIAGNOSIS — E785 Hyperlipidemia, unspecified: Secondary | ICD-10-CM | POA: Diagnosis not present

## 2023-12-28 DIAGNOSIS — J020 Streptococcal pharyngitis: Secondary | ICD-10-CM

## 2023-12-28 DIAGNOSIS — Z20822 Contact with and (suspected) exposure to covid-19: Secondary | ICD-10-CM

## 2023-12-28 DIAGNOSIS — Z6841 Body Mass Index (BMI) 40.0 and over, adult: Secondary | ICD-10-CM | POA: Diagnosis not present

## 2023-12-28 DIAGNOSIS — I10 Essential (primary) hypertension: Secondary | ICD-10-CM

## 2023-12-28 DIAGNOSIS — E559 Vitamin D deficiency, unspecified: Secondary | ICD-10-CM | POA: Diagnosis not present

## 2023-12-28 LAB — POC COVID19 BINAXNOW: SARS Coronavirus 2 Ag: NEGATIVE

## 2023-12-28 LAB — POCT RAPID STREP A (OFFICE): Rapid Strep A Screen: POSITIVE — AB

## 2023-12-28 LAB — POCT GLYCOSYLATED HEMOGLOBIN (HGB A1C): Hemoglobin A1C: 5.5 % (ref 4.0–5.6)

## 2023-12-28 LAB — POCT INFLUENZA A/B
Influenza A, POC: NEGATIVE
Influenza B, POC: NEGATIVE

## 2023-12-28 MED ORDER — FUROSEMIDE 20 MG PO TABS: 20.0000 mg | ORAL_TABLET | Freq: Every day | ORAL | 1 refills | Status: DC

## 2023-12-28 MED ORDER — MOUNJARO 2.5 MG/0.5ML ~~LOC~~ SOAJ: 2.5000 mg | SUBCUTANEOUS | 0 refills | Status: DC

## 2023-12-28 MED ORDER — ISOSORBIDE MONONITRATE ER 30 MG PO TB24: 30.0000 mg | ORAL_TABLET | Freq: Every day | ORAL | 1 refills | Status: DC

## 2023-12-28 MED ORDER — AMLODIPINE BESYLATE 10 MG PO TABS: 10.0000 mg | ORAL_TABLET | Freq: Every day | ORAL | 1 refills | Status: DC

## 2023-12-28 MED ORDER — AMOXICILLIN 500 MG PO CAPS
500.0000 mg | ORAL_CAPSULE | Freq: Two times a day (BID) | ORAL | 0 refills | Status: AC
Start: 2023-12-28 — End: 2024-01-07

## 2023-12-28 MED ORDER — HYDROCHLOROTHIAZIDE 25 MG PO TABS: 25.0000 mg | ORAL_TABLET | Freq: Every day | ORAL | 1 refills | Status: DC

## 2023-12-28 MED ORDER — ATORVASTATIN CALCIUM 20 MG PO TABS
20.0000 mg | ORAL_TABLET | Freq: Every day | ORAL | 3 refills | Status: AC
Start: 2023-12-28 — End: ?

## 2023-12-28 MED ORDER — BISOPROLOL FUMARATE 5 MG PO TABS: 5.0000 mg | ORAL_TABLET | Freq: Every day | ORAL | 1 refills | Status: DC

## 2023-12-28 NOTE — Assessment & Plan Note (Signed)
-   amoxicillin (AMOXIL) 500 MG capsule; Take 1 capsule (500 mg total) by mouth 2 (two) times daily for 10 days.  Dispense: 20 capsule; Refill: 0 Take Tylenol 650 mg every 6 hours as needed for fever, body aches Gargle with salt water

## 2023-12-28 NOTE — Assessment & Plan Note (Signed)
 Wt Readings from Last 3 Encounters:  12/28/23 290 lb (131.5 kg)  10/30/23 298 lb (135.2 kg)  10/02/23 (!) 308 lb (139.7 kg)   Body mass index is 46.81 kg/m.  Counseled on low-carb diet, encouraged to engage in regular moderate to vigorous exercise at least 150 minutes weekly as tolerated On Mounjaro for type 2 diabetes

## 2023-12-28 NOTE — Assessment & Plan Note (Signed)
 Test for COVID was negative

## 2023-12-28 NOTE — Assessment & Plan Note (Addendum)
 DASH diet and commitment to daily physical activity for a minimum of 30 minutes discussed and encouraged, as a part of hypertension management. The importance of attaining a healthy weight is also discussed. Blood pressure goal is less than 130/80 She does not check blood pressure at home and blood pressure could have been elevated today due to her acute sickness Medications refilled, nurse visit in 4 weeks to recheck BP Continue current medications - amLODipine (NORVASC) 10 MG tablet; Take 1 tablet (10 mg total) by mouth daily.  Dispense: 90 tablet; Refill: 1 - hydrochlorothiazide (HYDRODIURIL) 25 MG tablet; Take 1 tablet (25 mg total) by mouth daily.  Dispense: 90 tablet; Refill: 1 - furosemide (LASIX) 20 MG tablet; Take 1 tablet (20 mg total) by mouth daily.   - Isosorbide mononitrate 30 mg daily, -Bisoprolol 5 mg daily -Losartan 50 mg daily     12/28/2023    2:02 PM 12/28/2023    1:48 PM 10/30/2023   10:00 AM 10/30/2023    9:46 AM 10/02/2023    2:32 PM 10/02/2023    2:17 PM 04/17/2023    7:51 PM  BP/Weight  Systolic BP 142 145 138 133 134 153 162  Diastolic BP 79 80 76 77 70 76 91  Wt. (Lbs)  290  298  308   BMI  46.81 kg/m2  48.1 kg/m2  49.71 kg/m2

## 2023-12-28 NOTE — Assessment & Plan Note (Signed)
-   furosemide (LASIX) 20 MG tablet; Take 1 tablet (20 mg total) by mouth daily.  Dispense: 90 tablet; Refill: 1

## 2023-12-28 NOTE — Patient Instructions (Signed)
Around 3 times per week, check your blood pressure 2 times per day. once in the morning and once in the evening. The readings should be at least one minute apart. Write down these values and bring them to your next nurse visit/appointment.  When you check your BP, make sure you have been doing something calm/relaxing 5 minutes prior to checking. Both feet should be flat on the floor and you should be sitting. Use your left arm and make sure it is in a relaxed position (on a table), and that the cuff is at the approximate level/height of your heart.   Blood pressure goal is less than 130/80.    It is important that you exercise regularly at least 30 minutes 5 times a week as tolerated  Think about what you will eat, plan ahead. Choose " clean, green, fresh or frozen" over canned, processed or packaged foods which are more sugary, salty and fatty. 70 to 75% of food eaten should be vegetables and fruit. Three meals at set times with snacks allowed between meals, but they must be fruit or vegetables. Aim to eat over a 12 hour period , example 7 am to 7 pm, and STOP after  your last meal of the day. Drink water,generally about 64 ounces per day, no other drink is as healthy. Fruit juice is best enjoyed in a healthy way, by EATING the fruit.  Thanks for choosing [facility] we consider it a privelige to serve you.

## 2023-12-28 NOTE — Assessment & Plan Note (Signed)
 Last vitamin D Lab Results  Component Value Date   VD25OH 8.7 (L) 10/30/2023  On vitamin D for 50,000 units once weekly Checking labs

## 2023-12-28 NOTE — Assessment & Plan Note (Signed)
 Lab Results  Component Value Date   HGBA1C 6.4 (A) 10/02/2023  A1c 5.5 today Continue Mounjaro 2.5 mg daily Continue low-carb diet, lose weight Diabetic foot exam completed, has upcoming diabetic eye exam Follow-up in 3 months

## 2023-12-28 NOTE — Progress Notes (Signed)
 Established Patient Office Visit  Subjective:  Patient ID: Autumn [name], female    DOB: [birth year]  Age: 38 y.o. MRN: [id number]  CC:  Chief Complaint  Patient presents with   Generalized Body Aches   Covid Exposure    HPI Autumn [name] is a 38 y.o. female  has a past medical history of Coronary artery disease involving native coronary artery of native heart without angina pectoris (08/08/2021), Diabetes mellitus, type II (HCC), Essential hypertension, H/O varicella, Headache, Heart murmur, Heel spur, right (01/2020), Hypertension, Leaky heart valve, Mini stroke, Obese, Prediabetes, Torn Achilles tendon, right, initial encounter (01/2020), and Vitamin D deficiency (02/2020).  Patient presents for follow-up for her chronic medical conditions  Hypertension.  Patient stated that she has been taking her medications daily as ordered, denies chest pain, shortness of breath, edema  Type 2 diabetes.  Currently on Mounjaro 2.5 mg once weekly.  No complaints of polyuria polydipsia polyphagia, has a glucometer but does not check her blood sugar, denies symptoms of hypoglycemia.  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  She denies fever, cough, wheezing   Past Medical History:  Diagnosis Date   Coronary artery disease involving native coronary artery of native heart without angina pectoris 08/08/2021   Diabetes mellitus, type II (HCC)    Essential hypertension    H/O varicella    Headache    Heart murmur    Heel spur, right 01/2020   Hypertension    stopped meds since pregnant   Leaky heart valve    mild MR by echo   Mini stroke    2018    Obese    Prediabetes    Torn Achilles tendon, right, initial encounter 01/2020   Vitamin D deficiency 02/2020    Past Surgical History:  Procedure Laterality Date   BILATERAL  SALPINGECTOMY Bilateral 11/28/2013   Procedure: BILATERAL SALPINGECTOMY;  Surgeon: Michael Litter, MD;  Location: [facility];  Service: Obstetrics;  Laterality: Bilateral;  Partial on Left   CESAREAN SECTION  05/14/2012   Procedure: CESAREAN SECTION;  Surgeon: Hal Morales, MD;  Location: [facility];  Service: Gynecology;  Laterality: N/A;  Primary cesarean section of baby girl    at 0533 APGAR 8/9   CESAREAN SECTION N/A 11/28/2013   Procedure: CESAREAN SECTION;  Surgeon: Michael Litter, MD;  Location: [facility];  Service: Obstetrics;  Laterality: N/A;   CESAREAN SECTION      Family History  Problem Relation Age of Onset   Hypertension Mother    Diabetes Mellitus II Mother    Breast cancer Mother 67   Heart failure Mother    CAD Father    Hyperlipidemia Father    Heart disease Father    Heart attack Father    Hypertension Sister    Diabetes Mellitus II Sister    Diabetes Sister    Thyroid disease Sister    Anemia Sister    Hypertension Sister    Depression Sister    Heart murmur Sister    Heart murmur Daughter    Anesthesia problems Neg Hx     Social History   Socioeconomic History   Marital status: Married    Spouse name: Not on file   Number of children: 2   Years of education: Not on file   Highest education level: Associate degree:  academic program  Occupational History   Not on file  Tobacco Use   Smoking status: Never   Smokeless tobacco: Never  Vaping Use   Vaping status: Never Used  Substance and Sexual Activity   Alcohol use: No   Drug use: No   Sexual activity: Yes    Birth control/protection: None  Other Topics Concern   Not on file  Social History Narrative   ** Merged History Encounter **       Lives with husband and 2 children in a one story home.   Works as an Data processing manager.   Education: college degree.   Social Drivers of Corporate investment banker Strain: Low Risk  (12/28/2023)   Overall Financial Resource Strain (CARDIA)    Difficulty of  Paying Living Expenses: Not very hard  Food Insecurity: No Food Insecurity (12/28/2023)   Hunger Vital Sign    Worried About Running Out of Food in the Last Year: Never true    Ran Out of Food in the Last Year: Never true  Transportation Needs: No Transportation Needs (12/28/2023)   PRAPARE - Administrator, Civil Service (Medical): No    Lack of Transportation (Non-Medical): No  Physical Activity: Sufficiently Active (12/28/2023)   Exercise Vital Sign    Days of Exercise per Week: 5 days    Minutes of Exercise per Session: 30 min  Stress: No Stress Concern Present (12/28/2023)   [employer] of Occupational Health - Occupational Stress Questionnaire    Feeling of Stress : Not at all  Social Connections: Socially Integrated (12/28/2023)   Social Connection and Isolation Panel [NHANES]    Frequency of Communication with Friends and Family: More than three times a week    Frequency of Social Gatherings with Friends and Family: Once a week    Attends Religious Services: More than 4 times per year    Active Member of [employer] or Organizations: Yes    Attends Engineer, structural: More than 4 times per year    Marital Status: Married  Catering manager Violence: Not on file    Outpatient Medications Prior to Visit  Medication Sig Dispense Refill   acetaminophen (TYLENOL) 500 MG tablet Take 500 mg by mouth every 6 (six) hours as needed for mild pain or headache.     aspirin EC 81 MG tablet Take 81 mg by mouth daily. Swallow whole.     blood glucose meter kit and supplies Dispense based on patient and insurance preference. Use up to four times daily as directed. (FOR ICD-9 250.00, 250.01). 1 each 0   lidocaine (LIDODERM) 5 % Place 1 patch onto the skin daily. Remove & Discard patch within 12 hours or as directed by MD 30 patch 0   losartan (COZAAR) 50 MG tablet Take 1 tablet (50 mg total) by mouth daily. 90 tablet 1   Vitamin D, Ergocalciferol, (DRISDOL) 1.25 MG (50000 UNIT)  CAPS capsule TAKE 1 CAPSULE (50,000 UNITS TOTAL) BY MOUTH EVERY 7 (SEVEN) DAYS 12 capsule 1   amLODipine (NORVASC) 10 MG tablet Take 1 tablet (10 mg total) by mouth daily. 30 tablet 11   atorvastatin (LIPITOR) 20 MG tablet Take 1 tablet (20 mg total) by mouth daily. 90 tablet 3   bisoprolol (ZEBETA) 5 MG tablet Take 1 tablet (5 mg total) by mouth daily. 30 tablet 2   furosemide (LASIX) 20 MG tablet Take 1 tablet (20 mg total) by mouth daily. 30 tablet 11   hydrochlorothiazide (  HYDRODIURIL) 25 MG tablet Take 1 tablet (25 mg total) by mouth daily. 30 tablet 11   isosorbide mononitrate (IMDUR) 30 MG 24 hr tablet Take 1 tablet (30 mg total) by mouth daily. 30 tablet 2   tirzepatide (MOUNJARO) 2.5 MG/0.5ML Pen Inject 2.5 mg into the skin once a week. 6 mL 0   nitroGLYCERIN (NITROSTAT) 0.4 MG SL tablet Place 1 tablet (0.4 mg total) under the tongue every 5 (five) minutes as needed for up to 10 days for chest pain. 20 tablet 0   ondansetron (ZOFRAN) 4 MG tablet Take 1 tablet (4 mg total) by mouth every 8 (eight) hours as needed for nausea or vomiting. (Patient not taking: Reported on 12/28/2023) 20 tablet 0   cyclobenzaprine (FLEXERIL) 10 MG tablet Take 1 tablet (10 mg total) by mouth 2 (two) times daily as needed for muscle spasms. (Patient not taking: Reported on 10/30/2023) 20 tablet 0   metFORMIN (GLUCOPHAGE-XR) 750 MG 24 hr tablet Take 1 tablet (750 mg total) by mouth 2 (two) times daily. (Patient not taking: Reported on 10/30/2023) 60 tablet 3   No facility-administered medications prior to visit.    No Known Allergies  ROS Review of Systems  Constitutional:  Negative for appetite change, chills, diaphoresis and fever.  HENT:  Positive for sore throat. Negative for congestion, postnasal drip, rhinorrhea and sneezing.   Respiratory:  Negative for cough, shortness of breath and wheezing.   Cardiovascular:  Negative for chest pain, palpitations and leg swelling.  Gastrointestinal:  Negative for  abdominal pain, constipation, nausea and vomiting.  Genitourinary:  Negative for difficulty urinating, dysuria, flank pain and frequency.  Musculoskeletal:  Negative for arthralgias, back pain, joint swelling and myalgias.  Skin:  Negative for color change, pallor, rash and wound.  Neurological:  Negative for dizziness, facial asymmetry, weakness, numbness and headaches.  Psychiatric/Behavioral:  Negative for behavioral problems, confusion, self-injury and suicidal ideas.       Objective:    Physical Exam Vitals and nursing note reviewed.  Constitutional:      General: She is not in acute distress.    Appearance: Normal appearance. She is obese. She is ill-appearing. She is not toxic-appearing or diaphoretic.  HENT:     Right Ear: Tympanic membrane, ear canal and external ear normal. There is no impacted cerumen.     Left Ear: Tympanic membrane, ear canal and external ear normal. There is no impacted cerumen.     Nose: No congestion or rhinorrhea.     Mouth/Throat:     Mouth: Mucous membranes are moist.     Tongue: No lesions. Tongue does not deviate from midline.     Pharynx: Oropharynx is clear. No oropharyngeal exudate or posterior oropharyngeal erythema.  Eyes:     General: No scleral icterus.       Right eye: No discharge.        Left eye: No discharge.     Extraocular Movements: Extraocular movements intact.     Conjunctiva/sclera: Conjunctivae normal.  Cardiovascular:     Rate and Rhythm: Normal rate and regular rhythm.     Pulses: Normal pulses.     Heart sounds: Normal heart sounds. No murmur heard.    No friction rub. No gallop.  Pulmonary:     Effort: Pulmonary effort is normal. No respiratory distress.     Breath sounds: Normal breath sounds. No stridor. No wheezing, rhonchi or rales.  Chest:     Chest wall: No tenderness.  Abdominal:  General: There is no distension.     Palpations: Abdomen is soft.     Tenderness: There is no abdominal tenderness. There is  no right CVA tenderness, left CVA tenderness or guarding.  Musculoskeletal:        General: No swelling, tenderness, deformity or signs of injury.     Right lower leg: No edema.     Left lower leg: No edema.  Skin:    General: Skin is warm and dry.     Capillary Refill: Capillary refill takes less than 2 seconds.     Coloration: Skin is not jaundiced or pale.     Findings: No bruising, erythema or lesion.  Neurological:     Mental Status: She is alert and oriented to person, place, and time.     Motor: No weakness.     Coordination: Coordination normal.     Gait: Gait normal.  Psychiatric:        Mood and Affect: Mood normal.        Behavior: Behavior normal.        Thought Content: Thought content normal.        Judgment: Judgment normal.     BP (!) 142/79   Pulse 81   Temp 98 F (36.7 C)   Wt 290 lb (131.5 kg)   SpO2 100%   BMI 46.81 kg/m  Wt Readings from Last 3 Encounters:  12/28/23 290 lb (131.5 kg)  10/30/23 298 lb (135.2 kg)  10/02/23 (!) 308 lb (139.7 kg)    Lab Results  Component Value Date   TSH 1.780 05/20/2021   Lab Results  Component Value Date   WBC 8.6 10/02/2023   HGB 11.1 10/02/2023   HCT 34.6 10/02/2023   MCV 77 (L) 10/02/2023   PLT 341 10/02/2023   Lab Results  Component Value Date   NA 138 10/02/2023   K 4.7 10/02/2023   CO2 24 10/02/2023   GLUCOSE 112 (H) 10/02/2023   BUN 8 10/02/2023   CREATININE 0.79 10/02/2023   BILITOT 0.5 10/02/2023   ALKPHOS 95 10/02/2023   AST 13 10/02/2023   ALT 13 10/02/2023   PROT 7.7 10/02/2023   ALBUMIN 3.9 10/02/2023   CALCIUM 9.1 10/02/2023   ANIONGAP 7 07/04/2021   EGFR 98 10/02/2023   Lab Results  Component Value Date   CHOL 158 10/30/2023   Lab Results  Component Value Date   HDL 50 10/30/2023   Lab Results  Component Value Date   LDLCALC 93 10/30/2023   Lab Results  Component Value Date   TRIG 77 10/30/2023   Lab Results  Component Value Date   CHOLHDL 3.2 10/30/2023   Lab  Results  Component Value Date   HGBA1C 5.5 12/28/2023      Assessment & Plan:   Problem List Items Addressed This Visit       Cardiovascular and Mediastinum   Essential hypertension - Primary   DASH diet and commitment to daily physical activity for a minimum of 30 minutes discussed and encouraged, as a part of hypertension management. The importance of attaining a healthy weight is also discussed. Blood pressure goal is less than 130/80 She does not check blood pressure at home and blood pressure could have been elevated today due to her acute sickness Medications refilled, nurse visit in 4 weeks to recheck BP Continue current medications - amLODipine (NORVASC) 10 MG tablet; Take 1 tablet (10 mg total) by mouth daily.  Dispense: 90 tablet; Refill: 1 -  hydrochlorothiazide (HYDRODIURIL) 25 MG tablet; Take 1 tablet (25 mg total) by mouth daily.  Dispense: 90 tablet; Refill: 1 - furosemide (LASIX) 20 MG tablet; Take 1 tablet (20 mg total) by mouth daily.   - Isosorbide mononitrate 30 mg daily, -Bisoprolol 5 mg daily -Losartan 50 mg daily     12/28/2023    2:02 PM 12/28/2023    1:48 PM 10/30/2023   10:00 AM 10/30/2023    9:46 AM 10/02/2023    2:32 PM 10/02/2023    2:17 PM 04/17/2023    7:51 PM  BP/Weight  Systolic BP 142 145 138 133 134 153 162  Diastolic BP 79 80 76 77 70 76 91  Wt. (Lbs)  290  298  308   BMI  46.81 kg/m2  48.1 kg/m2  49.71 kg/m2            Relevant Medications   amLODipine (NORVASC) 10 MG tablet   bisoprolol (ZEBETA) 5 MG tablet   hydrochlorothiazide (HYDRODIURIL) 25 MG tablet   isosorbide mononitrate (IMDUR) 30 MG 24 hr tablet   furosemide (LASIX) 20 MG tablet   atorvastatin (LIPITOR) 20 MG tablet   Other Relevant Orders   Recheck vitals     Respiratory   Strep pharyngitis    - amoxicillin (AMOXIL) 500 MG capsule; Take 1 capsule (500 mg total) by mouth 2 (two) times daily for 10 days.  Dispense: 20 capsule; Refill: 0 Take Tylenol 650 mg every 6 hours as  needed for fever, body aches Gargle with salt water        Relevant Medications   amoxicillin (AMOXIL) 500 MG capsule   Other Relevant Orders   POC COVID-19 BinaxNow (Completed)   POCT rapid strep A (Completed)   POCT Influenza A/B (Completed)     Endocrine   Type 2 diabetes mellitus with hyperglycemia, without long-term current use of insulin (HCC)   Lab Results  Component Value Date   HGBA1C 6.4 (A) 10/02/2023  A1c 5.5 today Continue Mounjaro 2.5 mg daily Continue low-carb diet, lose weight Diabetic foot exam completed, has upcoming diabetic eye exam Follow-up in 3 months      Relevant Medications   atorvastatin (LIPITOR) 20 MG tablet   tirzepatide (MOUNJARO) 2.5 MG/0.5ML Pen   Other Relevant Orders   POCT glycosylated hemoglobin (Hb A1C) (Completed)     Other   Peripheral edema    - furosemide (LASIX) 20 MG tablet; Take 1 tablet (20 mg total) by mouth daily.  Dispense: 90 tablet; Refill: 1       Relevant Medications   furosemide (LASIX) 20 MG tablet   Class 3 severe obesity due to excess calories with serious comorbidity and body mass index (BMI) of 50.0 to 59.9 in adult (HCC)   Wt Readings from Last 3 Encounters:  12/28/23 290 lb (131.5 kg)  10/30/23 298 lb (135.2 kg)  10/02/23 (!) 308 lb (139.7 kg)   Body mass index is 46.81 kg/m.  Counseled on low-carb diet, encouraged to engage in regular moderate to vigorous exercise at least 150 minutes weekly as tolerated On Mounjaro for type 2 diabetes      Relevant Medications   tirzepatide (MOUNJARO) 2.5 MG/0.5ML Pen   Dyslipidemia, goal LDL below 70   Relevant Medications   amLODipine (NORVASC) 10 MG tablet   bisoprolol (ZEBETA) 5 MG tablet   hydrochlorothiazide (HYDRODIURIL) 25 MG tablet   isosorbide mononitrate (IMDUR) 30 MG 24 hr tablet   furosemide (LASIX) 20 MG tablet   atorvastatin (LIPITOR)  20 MG tablet   Vitamin D deficiency   Last vitamin D Lab Results  Component Value Date   VD25OH 8.7 (L)  10/30/2023  On vitamin D for 50,000 units once weekly Checking labs      Relevant Orders   VITAMIN D 25 Hydroxy (Vit-D Deficiency, Fractures)   Exposure to COVID-19 virus   Test for COVID was negative      Relevant Orders   POC COVID-19 BinaxNow (Completed)    Meds ordered this encounter  Medications   amLODipine (NORVASC) 10 MG tablet    Sig: Take 1 tablet (10 mg total) by mouth daily.    Dispense:  90 tablet    Refill:  1   bisoprolol (ZEBETA) 5 MG tablet    Sig: Take 1 tablet (5 mg total) by mouth daily.    Dispense:  90 tablet    Refill:  1   hydrochlorothiazide (HYDRODIURIL) 25 MG tablet    Sig: Take 1 tablet (25 mg total) by mouth daily.    Dispense:  90 tablet    Refill:  1   isosorbide mononitrate (IMDUR) 30 MG 24 hr tablet    Sig: Take 1 tablet (30 mg total) by mouth daily.    Dispense:  90 tablet    Refill:  1   furosemide (LASIX) 20 MG tablet    Sig: Take 1 tablet (20 mg total) by mouth daily.    Dispense:  90 tablet    Refill:  1   amoxicillin (AMOXIL) 500 MG capsule    Sig: Take 1 capsule (500 mg total) by mouth 2 (two) times daily for 10 days.    Dispense:  20 capsule    Refill:  0   atorvastatin (LIPITOR) 20 MG tablet    Sig: Take 1 tablet (20 mg total) by mouth daily.    Dispense:  90 tablet    Refill:  3   tirzepatide (MOUNJARO) 2.5 MG/0.5ML Pen    Sig: Inject 2.5 mg into the skin once a week.    Dispense:  6 mL    Refill:  0    Follow-up: Return in about 3 months (around 03/28/2024) for HTN.    Donell Beers, FNP

## 2023-12-29 LAB — VITAMIN D 25 HYDROXY (VIT D DEFICIENCY, FRACTURES): Vit D, 25-Hydroxy: 23.1 ng/mL — ABNORMAL LOW (ref 30.0–100.0)

## 2023-12-31 ENCOUNTER — Ambulatory Visit: Payer: Self-pay

## 2023-12-31 ENCOUNTER — Other Ambulatory Visit: Payer: Self-pay | Admitting: Nurse Practitioner

## 2023-12-31 DIAGNOSIS — U071 COVID-19: Secondary | ICD-10-CM

## 2023-12-31 MED ORDER — NIRMATRELVIR/RITONAVIR (PAXLOVID)TABLET: 3.0000 | ORAL_TABLET | Freq: Two times a day (BID) | ORAL | 0 refills | Status: DC

## 2023-12-31 MED ORDER — NIRMATRELVIR/RITONAVIR (PAXLOVID)TABLET: 3.0000 | ORAL_TABLET | Freq: Two times a day (BID) | ORAL | 0 refills | Status: AC

## 2023-12-31 MED ORDER — BENZONATATE 100 MG PO CAPS: 100.0000 mg | ORAL_CAPSULE | Freq: Three times a day (TID) | ORAL | 0 refills | Status: DC | PRN

## 2023-12-31 MED ORDER — NIRMATRELVIR/RITONAVIR (PAXLOVID)TABLET
3.0000 | ORAL_TABLET | Freq: Two times a day (BID) | ORAL | 0 refills | Status: DC
Start: 2023-12-31 — End: 2023-12-31

## 2023-12-31 NOTE — Progress Notes (Unsigned)
 Pt called the answering service on 12/30/23 at 12:29pm stating she tested positive for covid.      1. COVID-19 virus infection (Primary)  - benzonatate (TESSALON PERLES) 100 MG capsule; Take 1 capsule (100 mg total) by mouth 3 (three) times daily as needed for cough.  Dispense: 30 capsule; Refill: 0 - nirmatrelvir/ritonavir (PAXLOVID) 20 x 150 MG & 10 x 100MG  TABS; Take 3 tablets by mouth 2 (two) times daily for 5 days. Patient GFR is 98 Take nirmatrelvir (150 mg) two tablets twice daily for 5 days and ritonavir (100 mg) one tablet twice daily for 5 days.  Dispense: 30 tablet; Refill: 0

## 2023-12-31 NOTE — Telephone Encounter (Signed)
 This RN made first attempt to triage patient. No answer, left a message. Will route for additional attempts. Per chart, provider has sent medications for COVID and is working on a work note.   Copied From CRM [phone] [phone].  Reason for Triage: Pt called to report that she tested positive for Covid, says she was instructed to contact her PCP. Please advise   Best contact: [phone]   Says she will need to be written out of work, will need doctor's note

## 2023-12-31 NOTE — Telephone Encounter (Signed)
 Pt called the answering service on 12/30/23 at 12:29pm stating she tested positive for covid. No further details were provided.

## 2023-12-31 NOTE — Telephone Encounter (Signed)
 Chief Complaint: covid Symptoms: cough, fever, body aches, sob with exertion Frequency: friday Pertinent Negatives: Patient denies chest pain Disposition: [] ED /[] Urgent Care (no appt availability in office) / [] Appointment(In office/virtual)/ []  [facility]/ [x] Home Care/ [] Refused Recommended Disposition /[] [facility]/ [x]  Follow-up with PCP Additional Notes: Patient states that after being seen in office on Friday and testing neg she completed a home test on yesterday and it was positive. Instructed patient medication sent in by PCP.   Copied From CRM [id number].  Reason for Triage: Pt called to report that she tested positive for Covid, says she was instructed to contact her PCP. Please advise   Best contact: [phone]   Says she will need to be written out of work, will need doctor's note  Reason for Disposition  [1] COVID-19 infection suspected by caller or triager AND [2] mild symptoms (cough, fever, or others) AND [3] negative COVID-19 rapid test  Answer Assessment - Initial Assessment Questions 1. COVID-19 DIAGNOSIS: "How do you know that you have COVID?" (e.g., positive lab test or self-test, diagnosed by doctor or NP/PA, symptoms after exposure).     Home test 3. ONSET: "When did the COVID-19 symptoms start?"      Friday 4. WORST SYMPTOM: "What is your worst symptom?" (e.g., cough, fever, shortness of breath, muscle aches)     cough 5. COUGH: "Do you have a cough?" If Yes, ask: "How bad is the cough?"       severe 6. FEVER: "Do you have a fever?" If Yes, ask: "What is your temperature, how was it measured, and when did it start?"     Yes 100 7. RESPIRATORY STATUS: "Describe your breathing?" (e.g., normal; shortness of breath, wheezing, unable to speak)      Sob with exertion 8. BETTER-SAME-WORSE: "Are you getting better, staying the same or getting worse compared to yesterday?"  If getting worse, ask, "In what way?"     same 9. OTHER SYMPTOMS: "Do you  have any other symptoms?"  (e.g., chills, fatigue, headache, loss of smell or taste, muscle pain, sore throat)     Runny nose, congestion, body aches 10. HIGH RISK DISEASE: "Do you have any chronic medical problems?" (e.g., asthma, heart or lung disease, weak immune system, obesity, etc.)       no 11. VACCINE: "Have you had the COVID-19 vaccine?" If Yes, ask: "Which one, how many shots, when did you get it?"       3  Protocols used: Coronavirus (COVID-19) Diagnosed or Suspected-A-AH

## 2024-01-01 NOTE — Telephone Encounter (Signed)
 My chart message was sent to pt. KH

## 2024-01-02 ENCOUNTER — Ambulatory Visit: Payer: 59 | Admitting: Gastroenterology

## 2024-01-25 ENCOUNTER — Ambulatory Visit: Admitting: Gastroenterology

## 2024-02-05 ENCOUNTER — Ambulatory Visit: Payer: Self-pay

## 2024-02-05 NOTE — Progress Notes (Signed)
 Patient is in office today for a nurse visit for Blood Pressure Check. Patient's blood pressure was 132/73 and pulse 75

## 2024-02-13 ENCOUNTER — Other Ambulatory Visit: Payer: Self-pay | Admitting: Nurse Practitioner

## 2024-04-08 ENCOUNTER — Ambulatory Visit: Payer: Self-pay | Admitting: Nurse Practitioner

## 2024-06-13 ENCOUNTER — Other Ambulatory Visit: Payer: Self-pay | Admitting: Nurse Practitioner

## 2024-06-13 DIAGNOSIS — I10 Essential (primary) hypertension: Secondary | ICD-10-CM

## 2024-07-17 ENCOUNTER — Ambulatory Visit: Payer: Self-pay | Admitting: Nurse Practitioner

## 2024-07-23 ENCOUNTER — Encounter: Payer: Self-pay | Admitting: Nurse Practitioner

## 2024-07-23 ENCOUNTER — Ambulatory Visit: Payer: Self-pay | Admitting: Nurse Practitioner

## 2024-07-23 VITALS — BP 145/78 | HR 85 | Wt 279.0 lb

## 2024-07-23 DIAGNOSIS — E1165 Type 2 diabetes mellitus with hyperglycemia: Secondary | ICD-10-CM

## 2024-07-23 DIAGNOSIS — I10 Essential (primary) hypertension: Secondary | ICD-10-CM

## 2024-07-23 DIAGNOSIS — E785 Hyperlipidemia, unspecified: Secondary | ICD-10-CM | POA: Diagnosis not present

## 2024-07-23 DIAGNOSIS — I2510 Atherosclerotic heart disease of native coronary artery without angina pectoris: Secondary | ICD-10-CM

## 2024-07-23 DIAGNOSIS — Z1231 Encounter for screening mammogram for malignant neoplasm of breast: Secondary | ICD-10-CM

## 2024-07-23 DIAGNOSIS — E559 Vitamin D deficiency, unspecified: Secondary | ICD-10-CM | POA: Diagnosis not present

## 2024-07-23 DIAGNOSIS — Z113 Encounter for screening for infections with a predominantly sexual mode of transmission: Secondary | ICD-10-CM | POA: Diagnosis not present

## 2024-07-23 DIAGNOSIS — Z23 Encounter for immunization: Secondary | ICD-10-CM | POA: Diagnosis not present

## 2024-07-23 DIAGNOSIS — Z124 Encounter for screening for malignant neoplasm of cervix: Secondary | ICD-10-CM

## 2024-07-23 DIAGNOSIS — R600 Localized edema: Secondary | ICD-10-CM | POA: Diagnosis not present

## 2024-07-23 LAB — POCT GLYCOSYLATED HEMOGLOBIN (HGB A1C): Hemoglobin A1C: 5.6 % (ref 4.0–5.6)

## 2024-07-23 MED ORDER — LOSARTAN POTASSIUM 50 MG PO TABS: 50.0000 mg | ORAL_TABLET | Freq: Every day | ORAL | 0 refills | Status: AC

## 2024-07-23 MED ORDER — HYDROCHLOROTHIAZIDE 25 MG PO TABS: 25.0000 mg | ORAL_TABLET | Freq: Every day | ORAL | 1 refills | Status: AC

## 2024-07-23 MED ORDER — NITROGLYCERIN 0.4 MG SL SUBL: 0.4000 mg | SUBLINGUAL_TABLET | SUBLINGUAL | 1 refills | Status: AC | PRN

## 2024-07-23 MED ORDER — ASPIRIN EC 81 MG PO TBEC: 81.0000 mg | DELAYED_RELEASE_TABLET | Freq: Every day | ORAL | 1 refills | Status: AC

## 2024-07-23 MED ORDER — AMLODIPINE BESYLATE 10 MG PO TABS: 10.0000 mg | ORAL_TABLET | Freq: Every day | ORAL | 1 refills | Status: AC

## 2024-07-23 MED ORDER — BISOPROLOL FUMARATE 5 MG PO TABS: 5.0000 mg | ORAL_TABLET | Freq: Every day | ORAL | 1 refills | Status: AC

## 2024-07-23 MED ORDER — ISOSORBIDE MONONITRATE ER 30 MG PO TB24: 30.0000 mg | ORAL_TABLET | Freq: Every day | ORAL | 1 refills | Status: AC

## 2024-07-23 MED ORDER — TIRZEPATIDE 5 MG/0.5ML ~~LOC~~ SOAJ: 5.0000 mg | SUBCUTANEOUS | 1 refills | Status: DC

## 2024-07-23 MED ORDER — FUROSEMIDE 20 MG PO TABS: 20.0000 mg | ORAL_TABLET | Freq: Every day | ORAL | 1 refills | Status: AC

## 2024-07-23 NOTE — Assessment & Plan Note (Signed)
 Patient would like to be tested for STD She is currently asymptomatic

## 2024-07-23 NOTE — Assessment & Plan Note (Addendum)
 Autumn [name]

## 2024-07-23 NOTE — Assessment & Plan Note (Signed)
 Wt Readings from Last 3 Encounters:  07/23/24 279 lb (126.6 kg)  02/05/24 289 lb 12.8 oz (131.5 kg)  12/28/23 290 lb (131.5 kg)   Body mass index is 45.03 kg/m.  Current weight 279 lbs, down from 289 lbs since May. Tirzepatide  has helped curb appetite. Active at work but needs more structured exercise. - Encourage diet rich in vegetables and protein, reduced carbohydrates. - Recommend moderate to vigorous exercise for 30 minutes, five days a week.

## 2024-07-23 NOTE — Assessment & Plan Note (Addendum)
 Lab Results  Component Value Date   HGBA1C 5.6 07/23/2024   A1c improved to 5.6 from 6.4, indicating good glycemic control. Concern for hypoglycemia with higher tirzepatide  dose given current A1c level. Patient aware of hypoglycemia signs. - Prescribe higher dose of tirzepatide  (Mounjaro ) with caution for hypoglycemia. - Educate on signs of hypoglycemia, including dizziness, lightheadedness, weakness, and blurred vision.

## 2024-07-23 NOTE — Assessment & Plan Note (Signed)
     07/23/2024    1:48 PM 07/23/2024    1:29 PM 02/05/2024    1:00 PM 12/28/2023    2:02 PM 12/28/2023    1:48 PM 10/30/2023   10:00 AM 10/30/2023    9:46 AM  BP/Weight  Systolic BP 145 152 132 142 145 138 133  Diastolic BP 78 75 73 79 80 76 77  Wt. (Lbs)  279 289.8  290  298  BMI  45.03 kg/m2 46.77 kg/m2  46.81 kg/m2  48.1 kg/m2   Blood pressure elevated at 152/75 and 147/78. Non-adherence to antihypertensive medications may contribute to uncontrolled blood pressure. Goal is to maintain blood pressure below 130/80 to prevent complications. Cardiologist follow-up recommended for coronary artery disease management. -medications refilled  - Encourage home blood pressure monitoring. - Reinforce importance of daily medication adherence. - Refer to cardiology for follow-up.

## 2024-07-23 NOTE — Progress Notes (Signed)
 Established Patient Office Visit  Subjective:  Patient ID: Autumn [name] Witherington, female    DOB: 1986-06-11  Age: 38 y.o. MRN: [id number]  CC:  Chief Complaint  Patient presents with   Diabetes   Hypertension   Medical Management of Chronic Issues    Follow up    HPI    Discussed the use of AI scribe software for clinical note transcription with the patient, who gave verbal consent to proceed.  History of Present Illness Autumn [name] is a 38 year old female  has a past medical history of Coronary artery disease involving native coronary artery of native heart without angina pectoris (08/08/2021), Diabetes mellitus, type II (HCC), Essential hypertension, H/O varicella, Headache, Heart murmur, Heel spur, right (01/2020), Hypertension, Leaky heart valve, Mini stroke, Obese, Prediabetes, Torn Achilles tendon, right, initial encounter (01/2020), and Vitamin D  deficiency (02/2020).  who presents for follow-up on her blood pressure and chest pain.  She missed a follow-up appointment a couple of months ago and is here to discuss her blood pressure management and recent chest pain. She experienced chest pain last weekend, which persisted from Saturday through the weekend, with some relief by Monday. She took aspirin  but could not find her nitroglycerin . The pain was significant enough to disrupt her sleep and cause fatigue. She has not seen a cardiologist in two years.  She does not perceive these as high due to her history of higher readings. She has not been consistently checking her blood pressure at home despite having a machine. Her current medications include amlodipine  10 mg daily, losartan  50 mg daily,furosemide  20 mg daily, hydrochlorothiazide  25 mg daily, and isosorbide  mononitrate 30 mg daily, bisoprolol  5 mg daily. She has not been taking her medications consistently, with the last refill in April, and sometimes skips her diuretic unless she feels fluid retention.  She is  currently taking Mounjaro  2.5 mg for diabetes management, which has helped her lose 10 pounds since May, bringing her weight from 289 to 279 pounds. Her A1c has improved from 6.4 to 5.6. She reports a decreased appetite since starting Mounjaro , often skipping meals, and only eating when she feels hungry. 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  She has not had a mammogram in over a year and missed an eye doctor appointment due to work commitments. She has a history of fibroids and has not had a Pap smear in approximately five years.    Assessment & Plan      Past Medical History:  Diagnosis Date   Coronary artery disease involving native coronary artery of native heart without angina pectoris 08/08/2021   Diabetes mellitus, type II (HCC)    Essential hypertension    H/O varicella    Headache    Heart murmur    Heel spur, right 01/2020   Hypertension    stopped meds since pregnant   Leaky heart valve    mild MR by echo   Mini stroke    2018    Obese    Prediabetes    Torn Achilles tendon, right, initial encounter 01/2020   Vitamin D  deficiency 02/2020    Past Surgical History:  Procedure Laterality Date   BILATERAL SALPINGECTOMY Bilateral 11/28/2013   Procedure: BILATERAL SALPINGECTOMY;  Surgeon: Ovid DELENA All, MD;  Location: [facility];  Service: Obstetrics;  Laterality: Bilateral;  Partial on Left   CESAREAN SECTION  05/14/2012  Procedure: CESAREAN SECTION;  Surgeon: Shanda SHAUNNA Muscat, MD;  Location: [facility];  Service: Gynecology;  Laterality: N/A;  Primary cesarean section of baby girl    at 0533 APGAR 8/9   CESAREAN SECTION N/A 11/28/2013   Procedure: CESAREAN SECTION;  Surgeon: Ovid DELENA All, MD;  Location: [facility];  Service: Obstetrics;  Laterality: N/A;   CESAREAN SECTION      Family History  Problem Relation Age of Onset   Hypertension Mother    Diabetes Mellitus II Mother    Breast cancer  Mother 17   Heart failure Mother    CAD Father    Hyperlipidemia Father    Heart disease Father    Heart attack Father    Hypertension Sister    Diabetes Mellitus II Sister    Diabetes Sister    Thyroid  disease Sister    Anemia Sister    Hypertension Sister    Depression Sister    Heart murmur Sister    Heart murmur Daughter    Anesthesia problems Neg Hx     Social History   Socioeconomic History   Marital status: Married    Spouse name: Not on file   Number of children: 2   Years of education: Not on file   Highest education level: Associate degree: academic program  Occupational History   Not on file  Tobacco Use   Smoking status: Never   Smokeless tobacco: Never  Vaping Use   Vaping status: Never Used  Substance and Sexual Activity   Alcohol use: No   Drug use: No   Sexual activity: Yes    Birth control/protection: None  Other Topics Concern   Not on file  Social History Narrative   ** Merged History Encounter **       Lives with husband and 2 children in a one story home.   Works as an data processing manager.   Education: college degree.   Social Drivers of Corporate Investment Banker Strain: Low Risk  (12/28/2023)   Overall Financial Resource Strain (CARDIA)    Difficulty of Paying Living Expenses: Not very hard  Food Insecurity: No Food Insecurity (12/28/2023)   Hunger Vital Sign    Worried About Running Out of Food in the Last Year: Never true    Ran Out of Food in the Last Year: Never true  Transportation Needs: No Transportation Needs (12/28/2023)   PRAPARE - Administrator, Civil Service (Medical): No    Lack of Transportation (Non-Medical): No  Physical Activity: Sufficiently Active (12/28/2023)   Exercise Vital Sign    Days of Exercise per Week: 5 days    Minutes of Exercise per Session: 30 min  Stress: No Stress Concern Present (12/28/2023)   [employer] of Occupational Health - Occupational Stress Questionnaire    Feeling of Stress :  Not at all  Social Connections: Socially Integrated (12/28/2023)   Social Connection and Isolation Panel    Frequency of Communication with Friends and Family: More than three times a week    Frequency of Social Gatherings with Friends and Family: Once a week    Attends Religious Services: More than 4 times per year    Active Member of [employer] or Organizations: Yes    Attends Engineer, Structural: More than 4 times per year    Marital Status: Married  Catering Manager Violence: Not on file    Outpatient Medications Prior to Visit  Medication Sig Dispense Refill   acetaminophen  (  TYLENOL ) 500 MG tablet Take 500 mg by mouth every 6 (six) hours as needed for mild pain or headache.     atorvastatin  (LIPITOR) 20 MG tablet Take 1 tablet (20 mg total) by mouth daily. 90 tablet 3   amLODipine  (NORVASC ) 10 MG tablet Take 1 tablet (10 mg total) by mouth daily. 90 tablet 1   aspirin  EC 81 MG tablet Take 81 mg by mouth daily. Swallow whole.     bisoprolol  (ZEBETA ) 5 MG tablet Take 1 tablet (5 mg total) by mouth daily. 90 tablet 1   furosemide  (LASIX ) 20 MG tablet Take 1 tablet (20 mg total) by mouth daily. 90 tablet 1   hydrochlorothiazide  (HYDRODIURIL ) 25 MG tablet Take 1 tablet (25 mg total) by mouth daily. 90 tablet 1   isosorbide  mononitrate (IMDUR ) 30 MG 24 hr tablet Take 1 tablet (30 mg total) by mouth daily. 90 tablet 1   losartan  (COZAAR ) 50 MG tablet TAKE 1 TABLET BY MOUTH EVERY DAY 90 tablet 0   tirzepatide  (MOUNJARO ) 2.5 MG/0.5ML Pen Inject 2.5 mg into the skin once a week. 6 mL 0   blood glucose meter kit and supplies Dispense based on patient and insurance preference. Use up to four times daily as directed. (FOR ICD-9 250.00, 250.01). 1 each 0   lidocaine  (LIDODERM ) 5 % PLACE 1 PATCH ONTO THE SKIN DAILY. REMOVE & DISCARD PATCH WITHIN 12 HOURS OR AS DIRECTED BY MD (Patient not taking: Reported on 07/23/2024) 30 patch 0   ondansetron  (ZOFRAN ) 4 MG tablet Take 1 tablet (4 mg total) by  mouth every 8 (eight) hours as needed for nausea or vomiting. (Patient not taking: Reported on 12/28/2023) 20 tablet 0   Vitamin D , Ergocalciferol , (DRISDOL ) 1.25 MG (50000 UNIT) CAPS capsule TAKE 1 CAPSULE (50,000 UNITS TOTAL) BY MOUTH EVERY 7 (SEVEN) DAYS (Patient not taking: Reported on 07/23/2024) 12 capsule 1   benzonatate  (TESSALON  PERLES) 100 MG capsule Take 1 capsule (100 mg total) by mouth 3 (three) times daily as needed for cough. (Patient not taking: Reported on 07/23/2024) 30 capsule 0   nitroGLYCERIN  (NITROSTAT ) 0.4 MG SL tablet Place 1 tablet (0.4 mg total) under the tongue every 5 (five) minutes as needed for up to 10 days for chest pain. 20 tablet 0   No facility-administered medications prior to visit.    No Known Allergies  ROS Review of Systems  Constitutional:  Negative for appetite change, chills, fatigue and fever.  HENT:  Negative for congestion, postnasal drip, rhinorrhea and sneezing.   Respiratory:  Negative for cough, shortness of breath and wheezing.   Cardiovascular:  Negative for chest pain, palpitations and leg swelling.  Gastrointestinal:  Negative for abdominal pain, constipation, nausea and vomiting.  Genitourinary:  Negative for difficulty urinating, dysuria, flank pain and frequency.  Musculoskeletal:  Negative for arthralgias, back pain, joint swelling and myalgias.  Skin:  Negative for color change, pallor, rash and wound.  Neurological:  Negative for dizziness, facial asymmetry, weakness, numbness and headaches.  Psychiatric/Behavioral:  Negative for behavioral problems, confusion, self-injury and suicidal ideas.       Objective:    Physical Exam Vitals and nursing note reviewed.  Constitutional:      General: She is not in acute distress.    Appearance: Normal appearance. She is obese. She is not ill-appearing, toxic-appearing or diaphoretic.  Eyes:     General: No scleral icterus.       Right eye: No discharge.  Left eye: No discharge.      Extraocular Movements: Extraocular movements intact.     Conjunctiva/sclera: Conjunctivae normal.  Cardiovascular:     Rate and Rhythm: Normal rate and regular rhythm.     Pulses: Normal pulses.     Heart sounds: Normal heart sounds. No murmur heard.    No friction rub. No gallop.  Pulmonary:     Effort: Pulmonary effort is normal. No respiratory distress.     Breath sounds: Normal breath sounds. No stridor. No wheezing, rhonchi or rales.  Chest:     Chest wall: No tenderness.  Abdominal:     General: There is no distension.     Palpations: Abdomen is soft.     Tenderness: There is no abdominal tenderness. There is no right CVA tenderness, left CVA tenderness or guarding.  Musculoskeletal:        General: No swelling, tenderness, deformity or signs of injury.     Right lower leg: No edema.     Left lower leg: No edema.  Skin:    General: Skin is warm and dry.     Capillary Refill: Capillary refill takes less than 2 seconds.     Coloration: Skin is not jaundiced or pale.     Findings: No bruising, erythema or lesion.  Neurological:     Mental Status: She is alert and oriented to person, place, and time.     Motor: No weakness.     Coordination: Coordination normal.     Gait: Gait normal.  Psychiatric:        Mood and Affect: Mood normal.        Behavior: Behavior normal.        Thought Content: Thought content normal.        Judgment: Judgment normal.     BP (!) 145/78   Pulse 85   Wt 279 lb (126.6 kg)   SpO2 100%   BMI 45.03 kg/m  Wt Readings from Last 3 Encounters:  07/23/24 279 lb (126.6 kg)  02/05/24 289 lb 12.8 oz (131.5 kg)  12/28/23 290 lb (131.5 kg)    Lab Results  Component Value Date   TSH 1.780 05/20/2021   Lab Results  Component Value Date   WBC 8.6 10/02/2023   HGB 11.1 10/02/2023   HCT 34.6 10/02/2023   MCV 77 (L) 10/02/2023   PLT 341 10/02/2023   Lab Results  Component Value Date   NA 138 10/02/2023   K 4.7 10/02/2023   CO2 24  10/02/2023   GLUCOSE 112 (H) 10/02/2023   BUN 8 10/02/2023   CREATININE 0.79 10/02/2023   BILITOT 0.5 10/02/2023   ALKPHOS 95 10/02/2023   AST 13 10/02/2023   ALT 13 10/02/2023   PROT 7.7 10/02/2023   ALBUMIN 3.9 10/02/2023   CALCIUM  9.1 10/02/2023   ANIONGAP 7 07/04/2021   EGFR 98 10/02/2023   Lab Results  Component Value Date   CHOL 158 10/30/2023   Lab Results  Component Value Date   HDL 50 10/30/2023   Lab Results  Component Value Date   LDLCALC 93 10/30/2023   Lab Results  Component Value Date   TRIG 77 10/30/2023   Lab Results  Component Value Date   CHOLHDL 3.2 10/30/2023   Lab Results  Component Value Date   HGBA1C 5.6 07/23/2024      Assessment & Plan:   Problem List Items Addressed This Visit       Cardiovascular and Mediastinum  Essential hypertension       07/23/2024    1:48 PM 07/23/2024    1:29 PM 02/05/2024    1:00 PM 12/28/2023    2:02 PM 12/28/2023    1:48 PM 10/30/2023   10:00 AM 10/30/2023    9:46 AM  BP/Weight  Systolic BP 145 152 132 142 145 138 133  Diastolic BP 78 75 73 79 80 76 77  Wt. (Lbs)  279 289.8  290  298  BMI  45.03 kg/m2 46.77 kg/m2  46.81 kg/m2  48.1 kg/m2   Blood pressure elevated at 152/75 and 147/78. Non-adherence to antihypertensive medications may contribute to uncontrolled blood pressure. Goal is to maintain blood pressure below 130/80 to prevent complications. Cardiologist follow-up recommended for coronary artery disease management. -medications refilled  - Encourage home blood pressure monitoring. - Reinforce importance of daily medication adherence. - Refer to cardiology for follow-up.       Relevant Medications   amLODipine  (NORVASC ) 10 MG tablet   bisoprolol  (ZEBETA ) 5 MG tablet   furosemide  (LASIX ) 20 MG tablet   hydrochlorothiazide  (HYDRODIURIL ) 25 MG tablet   isosorbide  mononitrate (IMDUR ) 30 MG 24 hr tablet   losartan  (COZAAR ) 50 MG tablet   nitroGLYCERIN  (NITROSTAT ) 0.4 MG SL tablet   aspirin  EC  81 MG tablet   Other Relevant Orders   Basic Metabolic Panel   Coronary artery disease involving native coronary artery of native heart without angina pectoris   Continue aspirin  81 mg daily, atorvastatin  20 Daily Refrred to cardiology  Nitroglycerin  as needed      Relevant Medications   amLODipine  (NORVASC ) 10 MG tablet   bisoprolol  (ZEBETA ) 5 MG tablet   furosemide  (LASIX ) 20 MG tablet   hydrochlorothiazide  (HYDRODIURIL ) 25 MG tablet   isosorbide  mononitrate (IMDUR ) 30 MG 24 hr tablet   losartan  (COZAAR ) 50 MG tablet   nitroGLYCERIN  (NITROSTAT ) 0.4 MG SL tablet   aspirin  EC 81 MG tablet   Other Relevant Orders   Direct LDL   Ambulatory referral to Cardiology     Endocrine   Type 2 diabetes mellitus with hyperglycemia, without long-term current use of insulin  (HCC) - Primary   Lab Results  Component Value Date   HGBA1C 5.6 07/23/2024   A1c improved to 5.6 from 6.4, indicating good glycemic control. Concern for hypoglycemia with higher tirzepatide  dose given current A1c level. Patient aware of hypoglycemia signs. - Prescribe higher dose of tirzepatide  (Mounjaro ) with caution for hypoglycemia. - Educate on signs of hypoglycemia, including dizziness, lightheadedness, weakness, and blurred vision.        Relevant Medications   losartan  (COZAAR ) 50 MG tablet   tirzepatide  (MOUNJARO ) 5 MG/0.5ML Pen   aspirin  EC 81 MG tablet   Other Relevant Orders   POCT glycosylated hemoglobin (Hb A1C) (Completed)   Direct LDL     Other   Morbid obesity (HCC)   Wt Readings from Last 3 Encounters:  07/23/24 279 lb (126.6 kg)  02/05/24 289 lb 12.8 oz (131.5 kg)  12/28/23 290 lb (131.5 kg)   Body mass index is 45.03 kg/m.  Current weight 279 lbs, down from 289 lbs since May. Tirzepatide  has helped curb appetite. Active at work but needs more structured exercise. - Encourage diet rich in vegetables and protein, reduced carbohydrates. - Recommend moderate to vigorous exercise for 30  minutes, five days a week.      Relevant Medications   tirzepatide  (MOUNJARO ) 5 MG/0.5ML Pen   Peripheral edema   Relevant Medications   furosemide  (  LASIX ) 20 MG tablet   Dyslipidemia, goal LDL below 70   Lab Results  Component Value Date   CHOL 158 10/30/2023   HDL 50 10/30/2023   LDLCALC 93 10/30/2023   TRIG 77 10/30/2023   CHOLHDL 3.2 10/30/2023  . Inconsistent atorvastatin  intake noted. Lowering cholesterol crucial to reduce stroke and heart disease risk. - Reinforce importance of daily atorvastatin  intake. - Order cholesterol labs to re-evaluate levels.      Relevant Medications   amLODipine  (NORVASC ) 10 MG tablet   bisoprolol  (ZEBETA ) 5 MG tablet   furosemide  (LASIX ) 20 MG tablet   hydrochlorothiazide  (HYDRODIURIL ) 25 MG tablet   isosorbide  mononitrate (IMDUR ) 30 MG 24 hr tablet   losartan  (COZAAR ) 50 MG tablet   nitroGLYCERIN  (NITROSTAT ) 0.4 MG SL tablet   aspirin  EC 81 MG tablet   Vitamin D  deficiency   Last vitamin D  Lab Results  Component Value Date   VD25OH 23.1 (L) 12/28/2023         Relevant Orders   VITAMIN D  25 Hydroxy (Vit-D Deficiency, Fractures)   Need for influenza vaccination   Relevant Orders   Flu vaccine trivalent PF, 6mos and older(Flulaval,Afluria,Fluarix,Fluzone) (Completed)   Screen for STD (sexually transmitted disease)   Patient would like to be tested for STD She is currently asymptomatic      Relevant Orders   NuSwab Vaginitis Plus (VG+)   HepB+HepC+HIV Panel   RPR   Other Visit Diagnoses       Screening mammogram for breast cancer       Relevant Orders   MM 3D SCREENING MAMMOGRAM BILATERAL BREAST     Screening for cervical cancer       Relevant Orders   Ambulatory referral to Gynecology       Meds ordered this encounter  Medications   amLODipine  (NORVASC ) 10 MG tablet    Sig: Take 1 tablet (10 mg total) by mouth daily.    Dispense:  90 tablet    Refill:  1   bisoprolol  (ZEBETA ) 5 MG tablet    Sig: Take 1 tablet (5  mg total) by mouth daily.    Dispense:  90 tablet    Refill:  1   furosemide  (LASIX ) 20 MG tablet    Sig: Take 1 tablet (20 mg total) by mouth daily.    Dispense:  90 tablet    Refill:  1   hydrochlorothiazide  (HYDRODIURIL ) 25 MG tablet    Sig: Take 1 tablet (25 mg total) by mouth daily.    Dispense:  90 tablet    Refill:  1   isosorbide  mononitrate (IMDUR ) 30 MG 24 hr tablet    Sig: Take 1 tablet (30 mg total) by mouth daily.    Dispense:  90 tablet    Refill:  1   losartan  (COZAAR ) 50 MG tablet    Sig: Take 1 tablet (50 mg total) by mouth daily.    Dispense:  90 tablet    Refill:  0   nitroGLYCERIN  (NITROSTAT ) 0.4 MG SL tablet    Sig: Place 1 tablet (0.4 mg total) under the tongue every 5 (five) minutes as needed for up to 10 days for chest pain.    Dispense:  20 tablet    Refill:  1   tirzepatide  (MOUNJARO ) 5 MG/0.5ML Pen    Sig: Inject 5 mg into the skin once a week.    Dispense:  6 mL    Refill:  1   aspirin  EC 81 MG tablet  Sig: Take 1 tablet (81 mg total) by mouth daily. Swallow whole.    Dispense:  90 tablet    Refill:  1    Follow-up: Return in about 6 weeks (around 09/03/2024) for HTN.    Shaft Corigliano R Jaren Vanetten, FNP

## 2024-07-23 NOTE — Assessment & Plan Note (Signed)
 Lab Results  Component Value Date   CHOL 158 10/30/2023   HDL 50 10/30/2023   LDLCALC 93 10/30/2023   TRIG 77 10/30/2023   CHOLHDL 3.2 10/30/2023  . Inconsistent atorvastatin  intake noted. Lowering cholesterol crucial to reduce stroke and heart disease risk. - Reinforce importance of daily atorvastatin  intake. - Order cholesterol labs to re-evaluate levels.

## 2024-07-23 NOTE — Assessment & Plan Note (Signed)
 Last vitamin D  Lab Results  Component Value Date   VD25OH 23.1 (L) 12/28/2023

## 2024-07-23 NOTE — Patient Instructions (Signed)
 Around 3 times per week, check your blood pressure 2 times per day. once in the morning and once in the evening. The readings should be at least one minute apart. Write down these values and bring them to your next nurse visit/appointment.  When you check your BP, make sure you have been doing something calm/relaxing 5 minutes prior to checking. Both feet should be flat on the floor and you should be sitting. Use your left arm and make sure it is in a relaxed position (on a table), and that the cuff is at the approximate level/height of your heart.  Blood pressure goal is less than 130/80     . Essential hypertension  - amLODipine  (NORVASC ) 10 MG tablet; Take 1 tablet (10 mg total) by mouth daily.  Dispense: 90 tablet; Refill: 1 - bisoprolol  (ZEBETA ) 5 MG tablet; Take 1 tablet (5 mg total) by mouth daily.  Dispense: 90 tablet; Refill: 1 - furosemide  (LASIX ) 20 MG tablet; Take 1 tablet (20 mg total) by mouth daily.  Dispense: 90 tablet; Refill: 1 - hydrochlorothiazide  (HYDRODIURIL ) 25 MG tablet; Take 1 tablet (25 mg total) by mouth daily.  Dispense: 90 tablet; Refill: 1 - isosorbide  mononitrate (IMDUR ) 30 MG 24 hr tablet; Take 1 tablet (30 mg total) by mouth daily.  Dispense: 90 tablet; Refill: 1 - losartan  (COZAAR ) 50 MG tablet; Take 1 tablet (50 mg total) by mouth daily.  Dispense: 90 tablet; Refill: 0 - Basic Metabolic Panel    . Coronary artery disease involving native coronary artery of native heart without angina pectoris  - nitroGLYCERIN  (NITROSTAT ) 0.4 MG SL tablet; Place 1 tablet (0.4 mg total) under the tongue every 5 (five) minutes as needed for up to 10 days for chest pain.  Dispense: 20 tablet; Refill: 1 - Direct LDL - Ambulatory referral to Cardiology   . Screening mammogram for breast cancer  - MM Digital Screening; Future  Please call [phone]   to schedule your mammogram.  [facility]. [address] [place], [place] [address].  [place] .  She  Screening for cervical cancer  - Ambulatory referral to Gynecology     It is important that you exercise regularly at least 30 minutes 5 times a week as tolerated  Think about what you will eat, plan ahead. Choose  clean, green, fresh or frozen over canned, processed or packaged foods which are more sugary, salty and fatty. 70 to 75% of food eaten should be vegetables and fruit. Three meals at set times with snacks allowed between meals, but they must be fruit or vegetables. Aim to eat over a 12 hour period , example 7 am to 7 pm, and STOP after  your last meal of the day. Drink water,generally about 64 ounces per day, no other drink is as healthy. Fruit juice is best enjoyed in a healthy way, by EATING the fruit.  Thanks for choosing [facility] we consider it a privelige to serve you.

## 2024-07-23 NOTE — Assessment & Plan Note (Addendum)
 Continue aspirin  81 mg daily, atorvastatin  20 Daily Refrred to cardiology  Nitroglycerin  as needed

## 2024-07-24 ENCOUNTER — Ambulatory Visit: Payer: Self-pay | Admitting: Nurse Practitioner

## 2024-07-24 DIAGNOSIS — B3731 Acute candidiasis of vulva and vagina: Secondary | ICD-10-CM

## 2024-07-24 DIAGNOSIS — B9689 Other specified bacterial agents as the cause of diseases classified elsewhere: Secondary | ICD-10-CM

## 2024-07-24 LAB — VITAMIN D 25 HYDROXY (VIT D DEFICIENCY, FRACTURES): Vit D, 25-Hydroxy: 17.1 ng/mL — ABNORMAL LOW (ref 30.0–100.0)

## 2024-07-24 LAB — HEPB+HEPC+HIV PANEL
HIV Screen 4th Generation wRfx: NONREACTIVE
Hep B C IgM: NEGATIVE
Hep B Core Total Ab: NEGATIVE
Hep B E Ab: NONREACTIVE
Hep B E Ag: NEGATIVE
Hep B Surface Ab, Qual: NONREACTIVE
Hep C Virus Ab: NONREACTIVE
Hepatitis B Surface Ag: NEGATIVE

## 2024-07-24 LAB — BASIC METABOLIC PANEL WITH GFR
BUN/Creatinine Ratio: 9 (ref 9–23)
BUN: 8 mg/dL (ref 6–24)
CO2: 24 mmol/L (ref 20–29)
Calcium: 9.5 mg/dL (ref 8.7–10.2)
Chloride: 101 mmol/L (ref 96–106)
Creatinine, Ser: 0.86 mg/dL (ref 0.57–1.00)
Glucose: 94 mg/dL (ref 70–99)
Potassium: 3.9 mmol/L (ref 3.5–5.2)
Sodium: 138 mmol/L (ref 134–144)
eGFR: 88 mL/min/1.73 (ref >59)

## 2024-07-24 LAB — RPR: RPR Ser Ql: NONREACTIVE

## 2024-07-24 LAB — LDL CHOLESTEROL, DIRECT: LDL Direct: 106 mg/dL — ABNORMAL HIGH (ref 0–99)

## 2024-07-25 MED ORDER — FLUCONAZOLE 150 MG PO TABS: 150.0000 mg | ORAL_TABLET | Freq: Once | ORAL | 0 refills | Status: AC

## 2024-07-25 MED ORDER — METRONIDAZOLE 500 MG PO TABS: 500.0000 mg | ORAL_TABLET | Freq: Two times a day (BID) | ORAL | 0 refills | Status: AC

## 2024-07-26 LAB — NUSWAB VAGINITIS PLUS (VG+)
Atopobium vaginae: HIGH {score} — AB
Candida albicans, NAA: NEGATIVE
Candida glabrata, NAA: POSITIVE — AB
Megasphaera 1: HIGH {score} — AB

## 2024-08-13 ENCOUNTER — Telehealth: Payer: Self-pay

## 2024-08-13 ENCOUNTER — Other Ambulatory Visit: Payer: Self-pay | Admitting: Nurse Practitioner

## 2024-08-13 DIAGNOSIS — E1165 Type 2 diabetes mellitus with hyperglycemia: Secondary | ICD-10-CM

## 2024-08-13 MED ORDER — MOUNJARO 7.5 MG/0.5ML ~~LOC~~ SOAJ: 7.5000 mg | SUBCUTANEOUS | 0 refills | Status: AC

## 2024-08-13 NOTE — Telephone Encounter (Signed)
 Copied from CRM [id number]. Topic: Clinical - Lab/Test Results >> Aug 13, 2024  2:00 PM Donna BRAVO wrote: Reason for CRM: patient returning call from Victory Iha, RMA [facility]   08/13/24  8:57 AM Result Note Lvm for pt to call back. KH   Spoke to pt concerning picking up he medications. KH

## 2024-08-13 NOTE — Telephone Encounter (Signed)
 Copied from CRM [id number]. Topic: Clinical - Prescription Issue >> Aug 13, 2024  2:18 PM Avram MATSU wrote: Reason for CRM: patient stated her medication for tirzepatide  MOUNJARO  dose needs to be increased 7.5 MG, please advise [phone] (M)   [id number] [address], [place] - [address] AT [facility] [address] [place] [address] Phone: [phone] Fax: [phone]

## 2024-09-30 ENCOUNTER — Other Ambulatory Visit: Payer: Self-pay | Admitting: Nurse Practitioner

## 2024-09-30 DIAGNOSIS — B3731 Acute candidiasis of vulva and vagina: Secondary | ICD-10-CM

## 2024-09-30 NOTE — Telephone Encounter (Signed)
 Please advise on refill.

## 2024-10-06 ENCOUNTER — Other Ambulatory Visit: Payer: Self-pay

## 2024-10-08 ENCOUNTER — Other Ambulatory Visit: Payer: Self-pay

## 2024-10-08 ENCOUNTER — Telehealth: Payer: Self-pay

## 2024-10-08 NOTE — Telephone Encounter (Signed)
 Pharmacy Patient Advocate Encounter   Received notification from CoverMyMeds that prior authorization for MOUNJARO  is required/requested.   Insurance verification completed.   The patient is insured through [facility].   Per test claim: PA required; PA submitted to above mentioned insurance via CoverMyMeds Key/confirmation #/EOC [id number] Status is pending

## 2024-10-30 ENCOUNTER — Other Ambulatory Visit: Payer: Self-pay

## 2024-10-30 ENCOUNTER — Emergency Department (HOSPITAL_COMMUNITY)

## 2024-10-30 ENCOUNTER — Ambulatory Visit: Payer: Self-pay

## 2024-10-30 ENCOUNTER — Encounter (HOSPITAL_COMMUNITY): Payer: Self-pay | Admitting: Emergency Medicine

## 2024-10-30 ENCOUNTER — Emergency Department (HOSPITAL_COMMUNITY)
Admission: EM | Admit: 2024-10-30 | Discharge: 2024-10-31 | Disposition: A | Source: Home / Self Care | Attending: Emergency Medicine | Admitting: Emergency Medicine

## 2024-10-30 DIAGNOSIS — R079 Chest pain, unspecified: Secondary | ICD-10-CM

## 2024-10-30 DIAGNOSIS — R519 Headache, unspecified: Secondary | ICD-10-CM

## 2024-10-30 DIAGNOSIS — R0602 Shortness of breath: Secondary | ICD-10-CM

## 2024-10-30 LAB — CBC
HCT: 34.2 % — ABNORMAL LOW (ref 36.0–46.0)
Hemoglobin: 10.7 g/dL — ABNORMAL LOW (ref 12.0–15.0)
MCH: 24.4 pg — ABNORMAL LOW (ref 26.0–34.0)
MCHC: 31.3 g/dL (ref 30.0–36.0)
MCV: 78.1 fL — ABNORMAL LOW (ref 80.0–100.0)
Platelets: 297 10*3/uL (ref 150–400)
RBC: 4.38 MIL/uL (ref 3.87–5.11)
RDW: 15.2 % (ref 11.5–15.5)
WBC: 7.4 10*3/uL (ref 4.0–10.5)
nRBC: 0 % (ref 0.0–0.2)

## 2024-10-30 LAB — BASIC METABOLIC PANEL WITH GFR
Anion gap: 9 (ref 5–15)
BUN: 6 mg/dL (ref 6–20)
CO2: 25 mmol/L (ref 22–32)
Calcium: 9.2 mg/dL (ref 8.9–10.3)
Chloride: 103 mmol/L (ref 98–111)
Creatinine, Ser: 0.72 mg/dL (ref 0.44–1.00)
GFR, Estimated: >60 mL/min
Glucose, Bld: 94 mg/dL (ref 70–99)
Potassium: 4.2 mmol/L (ref 3.5–5.1)
Sodium: 137 mmol/L (ref 135–145)

## 2024-10-30 LAB — HCG, SERUM, QUALITATIVE: Preg, Serum: NEGATIVE

## 2024-10-30 LAB — TROPONIN T, HIGH SENSITIVITY: Troponin T High Sensitivity: <6 ng/L (ref 0–19)

## 2024-10-30 NOTE — ED Provider Notes (Incomplete)
 " [place] [facility]GENC[facility] DEPARTMENT AT [facility] Provider Note   CSN: [id number] Arrival date & time: 10/30/24  1800     Patient presents with: Chest Pain and Shortness of Breath   Autumn [name] is a 39 y.o. female with history of chronic headaches, chest pain at rest, myofascial pain, complicated migraines, hypertensive urgency, hypertension, type 2 diabetes, anxiety.  Presents to ED complaining of chest pain, shortness of breath, bilateral arm numbness, headache.  The patient reports that last night she was getting her daughter's ready for bed when she began to experience bilateral arm numbness which then progressed to centralized chest pain without radiation.  She states that she was also very short of breath at this time and due to her above-stated symptoms she was unable to get a good night sleep.  She reports that today she woke up with a headache which she reports is unusual for her.  She states that her headache has persisted throughout the day despite her taking Tylenol  this morning.  She states Tylenol  did help reduce her headache but the headache did not fully resolve.  She reports that this morning she also began to have more left-sided than right sided upper extremity bilateral numbness and tingling.  She states that throughout the course of the day the numbness and tingling has decreased but she has some very vague continued symptoms in her left upper extremity.  She arrives hypertensive at 175 and reports that she takes 3 blood pressure medications which she has taken all of today.  She denies blurred vision.  Nuys abdominal pain nausea or vomiting.  Denies any recent fevers.   Chest Pain Associated symptoms: shortness of breath   Shortness of Breath Associated symptoms: chest pain        Prior to Admission medications  Medication Sig Start Date End Date Taking? Authorizing Provider  acetaminophen  (TYLENOL ) 500 MG tablet Take 500 mg by mouth every 6 (six) hours  as needed for mild pain or headache.    [provider]  amLODipine  (NORVASC ) 10 MG tablet Take 1 tablet (10 mg total) by mouth daily. 07/23/24   Paseda, Folashade R, FNP  aspirin  EC 81 MG tablet Take 1 tablet (81 mg total) by mouth daily. Swallow whole. 07/23/24   Paseda, Folashade R, FNP  atorvastatin  (LIPITOR) 20 MG tablet Take 1 tablet (20 mg total) by mouth daily. 12/28/23   Paseda, Folashade R, FNP  bisoprolol  (ZEBETA ) 5 MG tablet Take 1 tablet (5 mg total) by mouth daily. 07/23/24   Paseda, Folashade R, FNP  blood glucose meter kit and supplies Dispense based on patient and insurance preference. Use up to four times daily as directed. (FOR ICD-9 250.00, 250.01). 05/03/17   Fairy Frames, MD  furosemide  (LASIX ) 20 MG tablet Take 1 tablet (20 mg total) by mouth daily. 07/23/24   Paseda, Folashade R, FNP  hydrochlorothiazide  (HYDRODIURIL ) 25 MG tablet Take 1 tablet (25 mg total) by mouth daily. 07/23/24   Paseda, Folashade R, FNP  isosorbide  mononitrate (IMDUR ) 30 MG 24 hr tablet Take 1 tablet (30 mg total) by mouth daily. 07/23/24 10/21/24  Paseda, Folashade R, FNP  lidocaine  (LIDODERM ) 5 % PLACE 1 PATCH ONTO THE SKIN DAILY. REMOVE & DISCARD PATCH WITHIN 12 HOURS OR AS DIRECTED BY MD Patient not taking: Reported on 07/23/2024 02/13/24   Paseda, Folashade R, FNP  losartan  (COZAAR ) 50 MG tablet Take 1 tablet (50 mg total) by mouth daily. 07/23/24   Paseda, Folashade R, FNP  nitroGLYCERIN  (NITROSTAT ) 0.4 MG SL tablet Place 1 tablet (0.4 mg total) under the tongue every 5 (five) minutes as needed for up to 10 days for chest pain. 07/23/24 08/02/24  Paseda, Folashade R, FNP  ondansetron  (ZOFRAN ) 4 MG tablet Take 1 tablet (4 mg total) by mouth every 8 (eight) hours as needed for nausea or vomiting. Patient not taking: Reported on 12/28/2023 10/02/23   Paseda, Folashade R, FNP  tirzepatide  (MOUNJARO ) 7.5 MG/0.5ML Pen Inject 7.5 mg into the skin once a week. 08/13/24   Paseda, Folashade R, FNP  Vitamin  D, Ergocalciferol , (DRISDOL ) 1.25 MG (50000 UNIT) CAPS capsule TAKE 1 CAPSULE (50,000 UNITS TOTAL) BY MOUTH EVERY 7 (SEVEN) DAYS Patient not taking: Reported on 07/23/2024 11/23/23   Paseda, Folashade R, FNP    Allergies: Patient has no known allergies.    Review of Systems  Respiratory:  Positive for shortness of breath.   Cardiovascular:  Positive for chest pain.    Updated Vital Signs BP (!) 174/118   Pulse 62   Temp 98.5 F (36.9 C)   Resp 16   SpO2 100%   Physical Exam  (all labs ordered are listed, but only abnormal results are displayed) Labs Reviewed  CBC - Abnormal; Notable for the following components:      Result Value   Hemoglobin 10.7 (*)    HCT 34.2 (*)    MCV 78.1 (*)    MCH 24.4 (*)    All other components within normal limits  BASIC METABOLIC PANEL WITH GFR  HCG, SERUM, QUALITATIVE  D-DIMER, QUANTITATIVE  TROPONIN T, HIGH SENSITIVITY  TROPONIN T, HIGH SENSITIVITY    EKG: None  Radiology: DG Chest 2 View Result Date: 10/30/2024 EXAM: 2 VIEW(S) XRAY OF THE CHEST 10/30/2024 07:03:00 PM COMPARISON: 10/25/2021 CLINICAL HISTORY: Pain. FINDINGS: LUNGS AND PLEURA: No focal pulmonary opacity. No pleural effusion. No pneumothorax. HEART AND MEDIASTINUM: No acute abnormality of the cardiac and mediastinal silhouettes. BONES AND SOFT TISSUES: No acute osseous abnormality. IMPRESSION: 1. No acute process. Electronically signed by: Pinkie Pebbles MD 10/30/2024 07:08 PM EST RP Workstation: [id number]    {Document cardiac monitor, telemetry assessment procedure when appropriate:32947} Procedures   Medications Ordered in the ED - No data to display    {Click here for ABCD2, HEART and other calculators REFRESH Note before signing:1}                              Medical Decision Making Amount and/or Complexity of Data Reviewed Labs: ordered. Radiology: ordered.   ***  {Document critical care time when appropriate  Document review of labs and clinical  decision tools ie CHADS2VASC2, etc  Document your independent review of radiology images and any outside records  Document your discussion with family members, caretakers and with consultants  Document social determinants of health affecting pt's care  Document your decision making why or why not admission, treatments were needed:32947:::1}   Final diagnoses:  None    ED Discharge Orders     None        "

## 2024-10-30 NOTE — Telephone Encounter (Signed)
 Chest tightness with HA, bilat arm numbness, balance issues. ED advised. Pt agreeable.   FYI Only or Action Required?: FYI only for provider: ED advised.  Patient was last seen in primary care on 07/23/2024 by Paseda, Folashade R, FNP.  Called Nurse Triage reporting Numbness.  Symptoms began a week ago.  Interventions attempted: Nothing.  Symptoms are: gradually worsening.  Triage Disposition: Go to ED Now (or PCP Triage)  Patient/caregiver understands and will follow disposition?: Yes       arm numbness and tingling when laying down, along with  headache all over, off balance, chest tightness  Reason for Disposition  Headache  Answer Assessment - Initial Assessment Questions 1. SYMPTOM: What is the main symptom you are concerned about? (e.g., weakness, numbness)     Bilat arm numbness with little chest tightness   2. ONSET: When did this start? (e.g., minutes, hours, days; while sleeping)     1 week  6. NEUROLOGIC SYMPTOMS: Have you had any of the following symptoms: headache, dizziness, vision loss, double vision, changes in speech, unsteady on your feet?     HA, bilat arm numbness, losing balance  Protocols used: Neurologic Deficit-A-AH

## 2024-10-30 NOTE — ED Triage Notes (Addendum)
 Patient presents due to bilateral arm numbness, lack of balance, mid chest pain and shortness of breath. She was told to come here by her primary care MD. Symptoms began around 2000 last night. She attempted to sleep, but the arm numbness and tingling woke her up.

## 2024-10-30 NOTE — ED Provider Notes (Signed)
 " [place] EMERGENCY DEPARTMENT AT [facility] Provider Note   CSN: [id number] Arrival date & time: 10/30/24  1800     Patient presents with: Chest Pain and Shortness of Breath   Autumn [name] is a 39 y.o. female with history of chronic headaches, chest pain at rest, myofascial pain, complicated migraines, hypertensive urgency, hypertension, type 2 diabetes, anxiety.  Presents to ED complaining of chest pain, shortness of breath, bilateral arm numbness, headache.  The patient reports that last night she was getting her daughter's ready for bed when she began to experience bilateral arm numbness which then progressed to centralized chest pain without radiation.  She states that she was also very short of breath at this time and due to her above-stated symptoms she was unable to get a good night sleep.  She reports that today she woke up with a headache which she reports is unusual for her.  She states that her headache has persisted throughout the day despite her taking Tylenol  this morning.  She states Tylenol  did help reduce her headache but the headache did not fully resolve.  She reports that this morning she also began to have more left-sided than right sided upper extremity bilateral numbness and tingling.  She states that throughout the course of the day the numbness and tingling has decreased and is now no longer present.  She arrives hypertensive at 175 and reports that she takes 3 blood pressure medications which she has taken all of today.  She denies blurred vision.  Denies abdominal pain nausea or vomiting.  Denies any recent fevers. Denies any dizziness or lightheadedness to me however did endorse this to triage RN.   Chest Pain Associated symptoms: shortness of breath   Shortness of Breath Associated symptoms: chest pain        Prior to Admission medications  Medication Sig Start Date End Date Taking? Authorizing Provider  acetaminophen  (TYLENOL ) 500 MG tablet  Take 500 mg by mouth every 6 (six) hours as needed for mild pain or headache.    [provider]  amLODipine  (NORVASC ) 10 MG tablet Take 1 tablet (10 mg total) by mouth daily. 07/23/24   Paseda, Folashade R, FNP  aspirin  EC 81 MG tablet Take 1 tablet (81 mg total) by mouth daily. Swallow whole. 07/23/24   Paseda, Folashade R, FNP  atorvastatin  (LIPITOR) 20 MG tablet Take 1 tablet (20 mg total) by mouth daily. 12/28/23   Paseda, Folashade R, FNP  bisoprolol  (ZEBETA ) 5 MG tablet Take 1 tablet (5 mg total) by mouth daily. 07/23/24   Paseda, Folashade R, FNP  blood glucose meter kit and supplies Dispense based on patient and insurance preference. Use up to four times daily as directed. (FOR ICD-9 250.00, 250.01). 05/03/17   Fairy Frames, MD  furosemide  (LASIX ) 20 MG tablet Take 1 tablet (20 mg total) by mouth daily. 07/23/24   Paseda, Folashade R, FNP  hydrochlorothiazide  (HYDRODIURIL ) 25 MG tablet Take 1 tablet (25 mg total) by mouth daily. 07/23/24   Paseda, Folashade R, FNP  isosorbide  mononitrate (IMDUR ) 30 MG 24 hr tablet Take 1 tablet (30 mg total) by mouth daily. 07/23/24 10/21/24  Paseda, Folashade R, FNP  lidocaine  (LIDODERM ) 5 % PLACE 1 PATCH ONTO THE SKIN DAILY. REMOVE & DISCARD PATCH WITHIN 12 HOURS OR AS DIRECTED BY MD Patient not taking: Reported on 07/23/2024 02/13/24   Paseda, Folashade R, FNP  losartan  (COZAAR ) 50 MG tablet Take 1 tablet (50 mg total) by mouth daily.  07/23/24   Paseda, Folashade R, FNP  nitroGLYCERIN  (NITROSTAT ) 0.4 MG SL tablet Place 1 tablet (0.4 mg total) under the tongue every 5 (five) minutes as needed for up to 10 days for chest pain. 07/23/24 08/02/24  Paseda, Folashade R, FNP  ondansetron  (ZOFRAN ) 4 MG tablet Take 1 tablet (4 mg total) by mouth every 8 (eight) hours as needed for nausea or vomiting. Patient not taking: Reported on 12/28/2023 10/02/23   Paseda, Folashade R, FNP  tirzepatide  (MOUNJARO ) 7.5 MG/0.5ML Pen Inject 7.5 mg into the skin once a week.  08/13/24   Paseda, Folashade R, FNP  Vitamin D , Ergocalciferol , (DRISDOL ) 1.25 MG (50000 UNIT) CAPS capsule TAKE 1 CAPSULE (50,000 UNITS TOTAL) BY MOUTH EVERY 7 (SEVEN) DAYS Patient not taking: Reported on 07/23/2024 11/23/23   Paseda, Folashade R, FNP    Allergies: Patient has no known allergies.    Review of Systems  Respiratory:  Positive for shortness of breath.   Cardiovascular:  Positive for chest pain.  All other systems reviewed and are negative.   Updated Vital Signs BP (!) 169/121   Pulse 71   Temp 98.9 F (37.2 C) (Oral)   Resp 17   SpO2 100%   Physical Exam Vitals and nursing note reviewed.  Constitutional:      General: She is not in acute distress.    Appearance: She is well-developed.  HENT:     Head: Normocephalic and atraumatic.  Eyes:     Conjunctiva/sclera: Conjunctivae normal.  Cardiovascular:     Rate and Rhythm: Normal rate and regular rhythm.     Heart sounds: No murmur heard. Pulmonary:     Effort: Pulmonary effort is normal. No respiratory distress.     Breath sounds: Normal breath sounds.  Abdominal:     Palpations: Abdomen is soft.     Tenderness: There is no abdominal tenderness.  Musculoskeletal:        General: No swelling.     Cervical back: Neck supple.  Skin:    General: Skin is warm and dry.     Capillary Refill: Capillary refill takes less than 2 seconds.  Neurological:     Mental Status: She is alert and oriented to person, place, and time. Mental status is at baseline.     Comments: CN II through XII intact.  Intact finger-to-nose, heel-to-shin.  No pronator drift.  No slurred speech.  Equal grip strength to upper extremities bilaterally.  Equal strength bilateral lower extremities.  PERRL.  Tracks cross midline.  Ambulates with steady gait.  Psychiatric:        Mood and Affect: Mood normal.     (all labs ordered are listed, but only abnormal results are displayed) Labs Reviewed  CBC - Abnormal; Notable for the following  components:      Result Value   Hemoglobin 10.7 (*)    HCT 34.2 (*)    MCV 78.1 (*)    MCH 24.4 (*)    All other components within normal limits  BASIC METABOLIC PANEL WITH GFR  HCG, SERUM, QUALITATIVE  D-DIMER, QUANTITATIVE  TROPONIN T, HIGH SENSITIVITY  TROPONIN T, HIGH SENSITIVITY    EKG: None  Radiology: CT Head Wo Contrast Result Date: 10/31/2024 EXAM: CT HEAD WITHOUT CONTRAST 10/31/2024 01:20:30 AM TECHNIQUE: CT of the head was performed without the administration of intravenous contrast. Automated exposure control, iterative reconstruction, and/or weight based adjustment of the mA/kV was utilized to reduce the radiation dose to as low as reasonably achievable. COMPARISON: 09/28/2014  CLINICAL HISTORY: Headache, increasing frequency or severity. FINDINGS: BRAIN AND VENTRICLES: No acute hemorrhage. No evidence of acute infarct. No hydrocephalus. No extra-axial collection. No mass effect or midline shift. Partially empty sella. SINUSES: No acute abnormality. SOFT TISSUES AND SKULL: No acute soft tissue abnormality. No skull fracture. IMPRESSION: 1. No acute intracranial abnormality. Electronically signed by: Pinkie Pebbles MD 10/31/2024 01:21 AM EST RP Workstation: [id number]   DG Chest 2 View Result Date: 10/30/2024 EXAM: 2 VIEW(S) XRAY OF THE CHEST 10/30/2024 07:03:00 PM COMPARISON: 10/25/2021 CLINICAL HISTORY: Pain. FINDINGS: LUNGS AND PLEURA: No focal pulmonary opacity. No pleural effusion. No pneumothorax. HEART AND MEDIASTINUM: No acute abnormality of the cardiac and mediastinal silhouettes. BONES AND SOFT TISSUES: No acute osseous abnormality. IMPRESSION: 1. No acute process. Electronically signed by: Pinkie Pebbles MD 10/30/2024 07:08 PM EST RP Workstation: [id number]    Procedures   Medications Ordered in the ED  losartan  (COZAAR ) tablet 50 mg (50 mg Oral Given 10/31/24 0246)  amLODipine  (NORVASC ) tablet 10 mg (10 mg Oral Given 10/31/24 0246)    Medical Decision  Making Amount and/or Complexity of Data Reviewed Labs: ordered. Radiology: ordered.   This is a 39 year old female presenting to the ED due to concerns of chest pain, shortness of breath and headache.  On exam, hypertensive.  Afebrile and nontachycardic.  Her lung sounds are clear bilaterally, she is not hypoxic.  Her abdomen is soft and compressible.  Neuroexam at baseline.  Overall she is nontoxic in appearance.  Assessed utilizing CBC, BMP, troponin x 2, D-dimer, hCG, CT scan of head, chest x-ray.  Patient CBC here is undetectable x 2.  Her D-dimer is undetectable so doubt PE.  CBC without leukocytosis, no anemia.  Metabolic panel is grossly unremarkable.  Her CT scan of head is unremarkable.  Her chest x-ray is remarkable.  Her EKG is nonischemic.  Patient reassessed and reports that her headache has reduced without intervention here.  She is still hypertensive at 169/121 so will provide her with home blood pressure medication as she states she has not taken her morning dose.  Will refer patient to neurology as she reports a history of chronic headaches but has not dealt with them much in her 50s. She was advised to follow-up outpatient with her PCP and she voiced understanding.    Final diagnoses:  Chest pain, unspecified type  Shortness of breath  Nonintractable headache, unspecified chronicity pattern, unspecified headache type    ED Discharge Orders     None          Ruthell Lonni FALCON, PA-C 10/31/24 0301    Trine Raynell Moder, MD 10/31/24 [phone]  "

## 2024-10-31 ENCOUNTER — Emergency Department (HOSPITAL_COMMUNITY)

## 2024-10-31 LAB — TROPONIN T, HIGH SENSITIVITY: Troponin T High Sensitivity: <6 ng/L (ref 0–19)

## 2024-10-31 LAB — D-DIMER, QUANTITATIVE: D-Dimer, Quant: <0.27 ug{FEU}/mL (ref 0.00–0.50)

## 2024-10-31 MED ORDER — AMLODIPINE BESYLATE 5 MG PO TABS
10.0000 mg | ORAL_TABLET | Freq: Once | ORAL | Status: AC
  Administered 2024-10-31: 10 mg via ORAL
  Filled 2024-10-31: qty 2

## 2024-10-31 MED ORDER — LOSARTAN POTASSIUM 50 MG PO TABS
50.0000 mg | ORAL_TABLET | ORAL | Status: AC
  Administered 2024-10-31: 50 mg via ORAL
  Filled 2024-10-31: qty 1

## 2024-10-31 NOTE — Discharge Instructions (Signed)
 As we discussed, your workup here in the ED was reassuring.  Please follow-up outpatient with neurology for ongoing care of your migraines.  Continue to take Tylenol , ibuprofen  at home for headaches.  Please also follow-up with your PCP in the meantime for continued ongoing care.  If you develop any new symptoms please return to the ED.

## 2024-11-03 ENCOUNTER — Inpatient Hospital Stay: Payer: Self-pay | Admitting: Nurse Practitioner

## 2024-11-12 ENCOUNTER — Inpatient Hospital Stay: Admitting: Nurse Practitioner
